# Patient Record
Sex: Female | Born: 1950
Health system: Southern US, Community
[De-identification: ages and names within clinical notes are randomized; demographics above are authoritative.]

## PROBLEM LIST (undated history)

## (undated) DIAGNOSIS — R030 Elevated blood-pressure reading, without diagnosis of hypertension: Secondary | ICD-10-CM

## (undated) DIAGNOSIS — K589 Irritable bowel syndrome without diarrhea: Secondary | ICD-10-CM

## (undated) DIAGNOSIS — Z85828 Personal history of other malignant neoplasm of skin: Secondary | ICD-10-CM

## (undated) DIAGNOSIS — Z9889 Other specified postprocedural states: Secondary | ICD-10-CM

## (undated) DIAGNOSIS — K219 Gastro-esophageal reflux disease without esophagitis: Secondary | ICD-10-CM

## (undated) DIAGNOSIS — H409 Unspecified glaucoma: Secondary | ICD-10-CM

## (undated) DIAGNOSIS — M722 Plantar fascial fibromatosis: Secondary | ICD-10-CM

## (undated) DIAGNOSIS — T7840XA Allergy, unspecified, initial encounter: Secondary | ICD-10-CM

## (undated) DIAGNOSIS — J40 Bronchitis, not specified as acute or chronic: Secondary | ICD-10-CM

## (undated) DIAGNOSIS — M1712 Unilateral primary osteoarthritis, left knee: Secondary | ICD-10-CM

## (undated) DIAGNOSIS — E039 Hypothyroidism, unspecified: Secondary | ICD-10-CM

## (undated) DIAGNOSIS — M1990 Unspecified osteoarthritis, unspecified site: Secondary | ICD-10-CM

## (undated) DIAGNOSIS — R112 Nausea with vomiting, unspecified: Secondary | ICD-10-CM

## (undated) DIAGNOSIS — M810 Age-related osteoporosis without current pathological fracture: Secondary | ICD-10-CM

## (undated) DIAGNOSIS — G4733 Obstructive sleep apnea (adult) (pediatric): Principal | ICD-10-CM

## (undated) DIAGNOSIS — E7800 Pure hypercholesterolemia, unspecified: Secondary | ICD-10-CM

## (undated) DIAGNOSIS — D509 Iron deficiency anemia, unspecified: Secondary | ICD-10-CM

## (undated) DIAGNOSIS — C434 Malignant melanoma of scalp and neck: Secondary | ICD-10-CM

## (undated) DIAGNOSIS — C439 Malignant melanoma of skin, unspecified: Secondary | ICD-10-CM

## (undated) DIAGNOSIS — G709 Myoneural disorder, unspecified: Secondary | ICD-10-CM

## (undated) DIAGNOSIS — H269 Unspecified cataract: Secondary | ICD-10-CM

## (undated) HISTORY — DX: Malignant melanoma of scalp and neck: C43.4

## (undated) HISTORY — PX: CATARACT EXTRACTION W/ INTRAOCULAR LENS  IMPLANT, BILATERAL: SHX1307

## (undated) HISTORY — DX: Age-related osteoporosis without current pathological fracture: M81.0

## (undated) HISTORY — DX: Iron deficiency anemia, unspecified: D50.9

## (undated) HISTORY — DX: Myoneural disorder, unspecified: G70.9

## (undated) HISTORY — PX: COLONOSCOPY: SHX174

## (undated) HISTORY — DX: Unspecified glaucoma: H40.9

## (undated) HISTORY — DX: Allergy, unspecified, initial encounter: T78.40XA

## (undated) HISTORY — DX: Bronchitis, not specified as acute or chronic: J40

## (undated) HISTORY — PX: JOINT REPLACEMENT: SHX530

## (undated) HISTORY — DX: Obstructive sleep apnea (adult) (pediatric): G47.33

## (undated) HISTORY — PX: MELANOMA EXCISION: SHX5266

## (undated) HISTORY — DX: Irritable bowel syndrome, unspecified: K58.9

## (undated) HISTORY — DX: Plantar fascial fibromatosis: M72.2

## (undated) HISTORY — PX: EPIDURAL CATHETER INSERTION: SHX1518

## (undated) HISTORY — PX: KNEE ARTHROSCOPY: SUR90

## (undated) HISTORY — DX: Elevated blood-pressure reading, without diagnosis of hypertension: R03.0

## (undated) HISTORY — DX: Unspecified cataract: H26.9

## (undated) HISTORY — DX: Unspecified osteoarthritis, unspecified site: M19.90

## (undated) HISTORY — DX: Pure hypercholesterolemia, unspecified: E78.00

## (undated) HISTORY — DX: Personal history of other malignant neoplasm of skin: Z85.828

## (undated) HISTORY — PX: POLYPECTOMY: SHX149

## (undated) HISTORY — DX: Gastro-esophageal reflux disease without esophagitis: K21.9

---
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
## 1988-10-04 HISTORY — PX: TUBAL LIGATION: SHX77

## 2001-10-02 ENCOUNTER — Encounter: Admission: RE | Admit: 2001-10-02 | Discharge: 2001-10-25 | Payer: Self-pay | Admitting: Sports Medicine

## 2001-10-04 HISTORY — PX: FOOT OSTEOTOMY: SHX957

## 2001-11-30 ENCOUNTER — Encounter: Admission: RE | Admit: 2001-11-30 | Discharge: 2001-12-29 | Payer: Self-pay | Admitting: Orthopedic Surgery

## 2002-10-04 HISTORY — PX: BUNIONECTOMY: SHX129

## 2004-01-03 HISTORY — PX: SKIN SURGERY: SHX2413

## 2004-08-06 ENCOUNTER — Ambulatory Visit: Payer: Self-pay | Admitting: Pulmonary Disease

## 2004-10-08 ENCOUNTER — Ambulatory Visit: Payer: Self-pay | Admitting: Pulmonary Disease

## 2004-11-05 ENCOUNTER — Ambulatory Visit: Payer: Self-pay | Admitting: Pulmonary Disease

## 2004-12-10 ENCOUNTER — Ambulatory Visit: Payer: Self-pay | Admitting: Pulmonary Disease

## 2005-08-09 ENCOUNTER — Ambulatory Visit: Payer: Self-pay | Admitting: Pulmonary Disease

## 2006-04-04 ENCOUNTER — Ambulatory Visit: Payer: Self-pay | Admitting: Pulmonary Disease

## 2007-01-02 ENCOUNTER — Ambulatory Visit: Payer: Self-pay | Admitting: Pulmonary Disease

## 2007-12-06 ENCOUNTER — Telehealth (INDEPENDENT_AMBULATORY_CARE_PROVIDER_SITE_OTHER): Payer: Self-pay | Admitting: *Deleted

## 2007-12-19 DIAGNOSIS — K589 Irritable bowel syndrome without diarrhea: Secondary | ICD-10-CM | POA: Insufficient documentation

## 2007-12-19 DIAGNOSIS — K219 Gastro-esophageal reflux disease without esophagitis: Secondary | ICD-10-CM | POA: Insufficient documentation

## 2007-12-19 DIAGNOSIS — I10 Essential (primary) hypertension: Secondary | ICD-10-CM | POA: Insufficient documentation

## 2007-12-19 DIAGNOSIS — T7840XA Allergy, unspecified, initial encounter: Secondary | ICD-10-CM | POA: Insufficient documentation

## 2007-12-19 DIAGNOSIS — J40 Bronchitis, not specified as acute or chronic: Secondary | ICD-10-CM | POA: Insufficient documentation

## 2007-12-19 DIAGNOSIS — F419 Anxiety disorder, unspecified: Secondary | ICD-10-CM | POA: Insufficient documentation

## 2007-12-19 DIAGNOSIS — F411 Generalized anxiety disorder: Secondary | ICD-10-CM | POA: Insufficient documentation

## 2007-12-19 HISTORY — DX: Essential (primary) hypertension: I10

## 2007-12-19 HISTORY — DX: Allergy, unspecified, initial encounter: T78.40XA

## 2007-12-19 HISTORY — DX: Gastro-esophageal reflux disease without esophagitis: K21.9

## 2008-11-20 ENCOUNTER — Telehealth (INDEPENDENT_AMBULATORY_CARE_PROVIDER_SITE_OTHER): Payer: Self-pay | Admitting: *Deleted

## 2009-05-23 ENCOUNTER — Encounter: Payer: Self-pay | Admitting: Pulmonary Disease

## 2009-09-15 ENCOUNTER — Ambulatory Visit: Payer: Self-pay | Admitting: Pulmonary Disease

## 2009-09-15 DIAGNOSIS — M25562 Pain in left knee: Secondary | ICD-10-CM | POA: Insufficient documentation

## 2009-09-15 DIAGNOSIS — Z85828 Personal history of other malignant neoplasm of skin: Secondary | ICD-10-CM | POA: Insufficient documentation

## 2009-09-21 DIAGNOSIS — E559 Vitamin D deficiency, unspecified: Secondary | ICD-10-CM

## 2009-09-21 DIAGNOSIS — E785 Hyperlipidemia, unspecified: Secondary | ICD-10-CM | POA: Insufficient documentation

## 2009-09-21 HISTORY — DX: Vitamin D deficiency, unspecified: E55.9

## 2010-03-31 ENCOUNTER — Ambulatory Visit: Payer: Self-pay | Admitting: Pulmonary Disease

## 2010-03-31 ENCOUNTER — Telehealth (INDEPENDENT_AMBULATORY_CARE_PROVIDER_SITE_OTHER): Payer: Self-pay | Admitting: *Deleted

## 2010-04-01 LAB — CONVERTED CEMR LAB
Cholesterol: 158 mg/dL (ref 0–200)
Total CHOL/HDL Ratio: 2
Triglycerides: 93 mg/dL (ref 0.0–149.0)
VLDL: 18.6 mg/dL (ref 0.0–40.0)

## 2010-04-03 ENCOUNTER — Telehealth: Payer: Self-pay | Admitting: Pulmonary Disease

## 2010-06-17 ENCOUNTER — Ambulatory Visit: Payer: Self-pay | Admitting: Pulmonary Disease

## 2010-07-20 ENCOUNTER — Telehealth: Payer: Self-pay | Admitting: Pulmonary Disease

## 2010-09-15 ENCOUNTER — Ambulatory Visit: Payer: Self-pay | Admitting: Pulmonary Disease

## 2010-09-15 ENCOUNTER — Encounter: Payer: Self-pay | Admitting: Pulmonary Disease

## 2010-09-21 LAB — CONVERTED CEMR LAB
Albumin: 4.5 g/dL (ref 3.5–5.2)
Basophils Absolute: 0 10*3/uL (ref 0.0–0.1)
Basophils Relative: 0.5 % (ref 0.0–3.0)
CO2: 30 meq/L (ref 19–32)
Creatinine, Ser: 0.7 mg/dL (ref 0.4–1.2)
Eosinophils Relative: 1.6 % (ref 0.0–5.0)
Glucose, Bld: 93 mg/dL (ref 70–99)
HCT: 44.4 % (ref 36.0–46.0)
Hemoglobin: 15.5 g/dL — ABNORMAL HIGH (ref 12.0–15.0)
LDL Cholesterol: 93 mg/dL (ref 0–99)
Leukocytes, UA: NEGATIVE
Lymphocytes Relative: 30 % (ref 12.0–46.0)
MCHC: 35 g/dL (ref 30.0–36.0)
Monocytes Relative: 6.6 % (ref 3.0–12.0)
Nitrite: NEGATIVE
Platelets: 236 10*3/uL (ref 150.0–400.0)
Potassium: 4 meq/L (ref 3.5–5.1)
RBC: 4.72 M/uL (ref 3.87–5.11)
RDW: 13.3 % (ref 11.5–14.6)
Sodium: 142 meq/L (ref 135–145)
TSH: 0.66 microintl units/mL (ref 0.35–5.50)
Total Protein, Urine: NEGATIVE mg/dL
Triglycerides: 82 mg/dL (ref 0.0–149.0)
Urine Glucose: NEGATIVE mg/dL
Urobilinogen, UA: 0.2 (ref 0.0–1.0)
VLDL: 16.4 mg/dL (ref 0.0–40.0)

## 2010-11-03 NOTE — Assessment & Plan Note (Signed)
Summary: 2 month follow up--recheck BP/la   CC:  9 month ROV & review of mult medical issues....  History of Present Illness: 60 y/o WF here for a follow up visit... she has multiple medical problems as noted below...     ~  September 15, 2009:  here for a 3 yr ROV & CPX... on ASA, Omep, MVI, and ?followed by the [facility] ([facility] [place]), and her GYN= DrWhite in [place]... her med list now includes Armour Thyroid from Elie Goody, NP;  numerous hormone creams + Progesterone 100mg /d, Aspirin, Niacin, Omeprazole, etc >> all under the direction of Elie Goody, NP at the [facility]... pt has indicated that they did labs etc but we do not have any of this data... she is not fasting today & declines to ret for fasting labs at this time...    ~  June 17, 2010:  she had FLP done here 6/11 on the Simva20 & all parameters were WNL.Marland Kitchen. she called 7/11 c/o feeling light headed w/ HA on top of head, & BP at Pharm was "155/88"... we decided to call in Metoprolol ER 50mg - start 1/2 daily & she reports excellent response w/ BP ret to normal & feeling well... now she would like to try & come off this med> OK to wean slowly off & monitor BP at home... she has CPX planned for 12/11.    Current Problems:   ALLERGY (ICD-995.3) - hx allergies w/ OTC Rx Prn...  Hx of BRONCHITIS (ICD-490)  HYPERTENSION, BORDERLINE (ICD-401.9) - BP  ~140/90 in past & she follows a low sodium, weight reducting diet strategy & wants to stay off medicvation if poss...  ~  12/10:  BP= 128/80 & similar at home, keep up the good work.  ~  7/11: pt called w/ HA, light headed, & BP= 155/88 at Pharm;  METOPROLOL ER 50mg - 1/2 daily started.  ~  9/11:  OV doing well on the 25mg  Metoprolol, BP= 110/76 & she wants to wean off if able- OK.  HYPERCHOLESTEROLEMIA (ICD-272.4) - prev on diet alone, SIMVASTATIN 20mg  started 12/10...  ~  FLP 1/06 showed TChol 221, TG 61, HDL 58, LDL 149... rec> diet/ exerc/ get wt  down.  ~  12/10: we decided to start Simva20 & ret for FLP.  ~  FLP 6/11 on Simva20 showed TChol 158, TG 93, HDL 63, LDL 76... continue same.  GERD (ICD-530.81) - prev on Nexium 40mg /d & switched to generic OMEPRAZOLE 20mg /d...   IBS (ICD-564.1) - hx mild IBS symptoms in the past...  ~  Colonoscopy 2003 was neg by DrPatterson...  GYN= DrWhite in [place] w/ PAP, Mammography, BMD's all done by them>> she reports all tests OK.  DEGENERATIVE JOINT DISEASE (ICD-715.90) - hx prev knee arthroscopy x2 by DrWainer... c/o persistant left knee pain under his care...  VITAMIN D DEFICIENCY (ICD-268.9) - hx low Vit D level at wellness center (level= 20 by pt hx) and she states started on Vit D shots (we do not have records & I discussed this with her)...  ANXIETY (ICD-300.00)  SKIN CANCER, HX OF (ICD-V10.83) - followed by [place] [facility], DrUhlin- hx Acne, Basal cell ca, & Melanoma on left shoulder 2005... she maintains regular f/u w/ Derm for surveillance...  *** "[facility]" EVAL & RX >>> she tells me that she saw Dr. Jeni Salles in the past, now NP Elie Goody at [facility]... 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 ? hypothyroidism- prev  on Armour Thyroid... now on 'hormone creams= bio-identical hormone replacement"...   Preventive Screening-Counseling & Management  Alcohol-Tobacco     Smoking Status: quit     Year Quit: 1977  Allergies: 1)  ! * Ketek  Comments:  Nurse/Medical Assistant: The patient's medications and allergies were reviewed with the patient and were updated in the Medication and Allergy Lists.  Past History:  Past Medical History: ALLERGY (ICD-995.3) Hx of BRONCHITIS (ICD-490) HYPERTENSION, BORDERLINE (ICD-401.9) HYPERCHOLESTEROLEMIA, BORDERLINE (ICD-272.4) GERD (ICD-530.81) IBS (ICD-564.1) DEGENERATIVE JOINT DISEASE (ICD-715.90) VITAMIN D DEFICIENCY (ICD-268.9) ANXIETY (ICD-300.00) SKIN CANCER, HX OF (ICD-V10.83) *** "[facility]" EVAL & RX...  Past Surgical History: S/P knee arthroscopy S/P melanoma surg left shoulder 4/05  Family History: Reviewed history from 09/15/2009 and no changes required. mother deceased age 50 hx of copd hx of arthritis and emphysema father deceased age 98 from MI---hx of asthma,heart disease, esophageal cancer 1 sibling alive age 3 hx of hyperchol 1 sibling alive age 68 hx of hyperchol  Social History: Reviewed history from 09/15/2009 and no changes required. quit smoking in 1977 smoked for 6 years minimal exercise caffeine use  occ alcohol use 1-3 drinks daily married 2 children  Review of Systems  The patient denies anorexia, fever, weight loss, weight gain, vision loss, decreased hearing, hoarseness, chest pain, syncope, dyspnea on exertion, peripheral edema, prolonged cough, headaches, hemoptysis, abdominal pain, melena, hematochezia, severe indigestion/heartburn, hematuria, incontinence, muscle weakness, suspicious skin lesions, transient blindness, difficulty walking, depression, unusual weight change, abnormal bleeding, enlarged lymph nodes, and angioedema.    Vital Signs:  Patient profile:   60 year old female Height:      66 inches Weight:      185.38 pounds BMI:     30.03 O2 Sat:      99 % on Room air Temp:     98.4 degrees F oral Pulse rate:   78 / minute BP sitting:   110 / 76  (left arm) Cuff size:   regular  Vitals Entered By: Randell Loop CMA (June 17, 2010 2:56 PM)  O2 Sat at Rest %:  99 O2 Flow:  Room air CC: 9 month ROV & review of mult medical issues... Is Patient Diabetic? No Pain Assessment Patient in pain? no      Comments meds updated today with pt   Physical Exam  Additional Exam:  WD, WN, 60 y/o WF in NAD... GENERAL:  Alert & oriented; pleasant & cooperative... HEENT:  [place]/AT, EOM-wnl, PERRLA, Fundi-benign, EACs-clear, TMs-wnl, NOSE-clear, THROAT-clear & wnl. NECK:  Supple w/ full ROM; no JVD; normal carotid impulses w/o  bruits; no thyromegaly or nodules palpated; no lymphadenopathy. CHEST:  Clear to P & A; without wheezes/ rales/ or rhonchi. HEART:  Regular Rhythm; without murmurs/ rubs/ or gallops. ABDOMEN:  Soft & nontender; normal bowel sounds; no organomegaly or masses detected. EXT: without deformities, +mild arthritis left knee; no varicose veins/ venous insuffic/ or edema. NEURO:  CN's intact; motor testing normal; sensory testing normal; gait normal & balance OK. DERM:  No lesions noted; no rash etc... scar left shoulder from prev melanoma surg 2005.    Impression & Recommendations:  Problem # 1:  HYPERTENSION, BORDERLINE (ICD-401.9) She wants to wean off the Metoprolol & BPs have all been good... OK to cut back & try off the med following BP's at home... rec to restart 25mg /d if BP > 140s/90... Her updated medication list for this problem includes:    Metoprolol Succinate 50 Mg Xr24h-tab (Metoprolol succinate) .Marland KitchenMarland KitchenMarland KitchenMarland Kitchen  Take as directed for bp...  Problem # 2:  HYPERCHOLESTEROLEMIA, BORDERLINE (ICD-272.4) Much improved on the simva20... continue same & we discussed diet + exercise- get wt down... Her updated medication list for this problem includes:    Simvastatin 20 Mg Tabs (Simvastatin) .Marland Kitchen... Take 1 tab by mouth at bedtime...    Niacin 500 Mg Tabs (Niacin) .Marland Kitchen... Take 1 tab by mouth once daily in the evening...  Problem # 3:  DEGENERATIVE JOINT DISEASE (ICD-715.90) DrWainer follows left knee prob... Her updated medication list for this problem includes:    Low-dose Aspirin 81 Mg Tabs (Aspirin) .Marland Kitchen... Take 1 tablet by mouth once a day  Problem # 4:  * "[facility]" EVAL & RX... She continues under the care of Elie Goody, NP...  Complete Medication List: 1)  Low-dose Aspirin 81 Mg Tabs (Aspirin) .... Take 1 tablet by mouth once a day 2)  Metoprolol Succinate 50 Mg Xr24h-tab (Metoprolol succinate) .... Take as directed for bp... 3)  Simvastatin 20 Mg Tabs (Simvastatin) .... Take 1 tab by  mouth at bedtime.Marland KitchenMarland Kitchen 4)  Niacin 500 Mg Tabs (Niacin) .... Take 1 tab by mouth once daily in the evening... 5)  Armour Thyroid - 1 Grain  .... Take as directed by your alternative medicine practitioner... 6)  Omeprazole 20 Mg Tbec (Omeprazole) .Marland Kitchen.. 1 by mouth once daily 7)  Multivitamins Caps (Multiple vitamin) .... Take 1 capsule by mouth once a day  Patient Instructions: 1)  Today we updated your med list- see below.... 2)  We discussed weaning off & stopping the Metoprolol as long as your BP remains <140s/ 90... 3)  Call for any questions.Marland KitchenMarland Kitchen 4)  Keep your follow up appt for your Physcial & labs.Marland KitchenMarland Kitchen

## 2010-11-03 NOTE — Progress Notes (Signed)
Summary: light head  Phone Note Call from Patient Call back at [phone]   Caller: Patient Call For: Jonne Rote Reason for Call: Talk to Nurse Summary of Call: PT STARTED FEELING LIGHT HEADED, went to drug store and toolk BP 155/88, normally runs 120/78.  Hasn't eat or drink anything that she feels would cause this, has a headache at the top of head, not real bad.  Hasn't been out in the heat today any except to go to drug store. Initial call taken by: Eugene Gavia,  April 03, 2010 3:42 PM  Follow-up for Phone Call        experiencing some HBP today--155/90---just today---ha in the top of the head---just at home today cooking--some lightheadedness---some nausea today prior to headache starting--please advise. Randell Loop CMA  April 03, 2010 3:56 PM   Additional Follow-up for Phone Call Additional follow up Details #1::        called and spoke with pt and per SN--recs are for metoprolol 50mg  er 1/2 tab daily---this has been called in locally to [facility] [place] for #15    and a long term rx has been faxed to [facility] for refills.  pt is aware of appt at 3pm on sept 14. Randell Loop CMA  April 03, 2010 4:41 PM     New/Updated Medications: METOPROLOL SUCCINATE 50 MG XR24H-TAB (METOPROLOL SUCCINATE) take 1/2 tablet by mouth once daily Prescriptions: METOPROLOL SUCCINATE 50 MG XR24H-TAB (METOPROLOL SUCCINATE) take 1/2 tablet by mouth once daily  #90 x 3   Entered by:   Randell Loop CMA   Authorized by:   Michele Mcalpine MD   Signed by:   Randell Loop CMA on 04/03/2010   Method used:   Faxed to ...       [facility]* (retail)             ,          Ph: [phone]       Fax: [phone]   RxID:   [id number]

## 2010-11-03 NOTE — Progress Notes (Signed)
Summary: letter for sn  Phone Note Call from Patient   Caller: Patient Call For: nadel Summary of Call: pt had labs done today and dropped off a letter for dr nadel. (it's sealed). i have given this to leigh. pt ph# [phone] Initial call taken by: Tivis Ringer, CNA,  March 31, 2010 9:34 AM  Follow-up for Phone Call        per pts instructions in the letter to fax a new rx of simvastatin 20mg  or what SN thinks that she needs to be on after her lab results come back to [facility].  this has been done.  labs are ok on the simvastatin 20mg  daily---per SN--cont this dose.  called and spoke with pt and she is aware of rx sent to [facility] and lab results. Follow-up by: Randell Loop CMA,  April 01, 2010 1:12 PM    Prescriptions: SIMVASTATIN 20 MG TABS (SIMVASTATIN) take 1 tab by mouth at bedtime...  #90 x 3   Entered by:   Randell Loop CMA   Authorized by:   Michele Mcalpine MD   Signed by:   Randell Loop CMA on 04/01/2010   Method used:   Faxed to ...       [facility]* (retail)             ,          Ph: [phone]       Fax: [phone]   RxID:   [id number]

## 2010-11-03 NOTE — Progress Notes (Signed)
Summary: legs/ hands   Phone Note Call from Patient   Caller: Patient Call For: nadel Summary of Call: pt c/o hands "tingly" and leg cramps x 2 months. legs are cold as well. [phone] Initial call taken by: Tivis Ringer, CNA,  July 20, 2010 9:01 AM  Follow-up for Phone Call        Pt c/o gradually over last 2 mnths noticing some cramps inher legs. Pt states x 2 weeks it has gotten alt worse. She is waking up at night with cramps in her legs, and a "tingly" feeling in them as well. She also states that her legs feel cold and this is when she notices the cramps more. Pt has stopped taking metoprolol per Sn recs. Please advise. Carron Curie CMA  July 20, 2010 9:40 AM  Kayren Eaves  Additional Follow-up for Phone Call Additional follow up Details #1::        per SN---try soma 350mg   #60  1 by mouth two times a day for muscle spasms--needs ov with SN.  thanks Randell Loop CMA  July 20, 2010 12:28 PM   rx sent pt aware. pt has appt with SNopn 09-15-10.Carron Curie CMA  July 20, 2010 12:48 PM     Additional Follow-up for Phone Call Additional follow up Details #2::    [id number].  Aundra Millet Reynolds LPN  July 20, 2010 12:37 PM   New/Updated Medications: SOMA 350 MG TABS (CARISOPRODOL) Take 1 tablet by mouth two times a day as need for muscle spasms Prescriptions: SOMA 350 MG TABS (CARISOPRODOL) Take 1 tablet by mouth two times a day as need for muscle spasms  #60 x 0   Entered by:   Carron Curie CMA   Authorized by:   Michele Mcalpine MD   Signed by:   Carron Curie CMA on 07/20/2010   Method used:   Electronically to        [id number]* (retail)       [address]       [place], [place]  [address]       Ph: [phone]       Fax: [phone]   RxID:   [id number]

## 2010-11-05 NOTE — Assessment & Plan Note (Signed)
Summary: 12 MONTHS/APC   CC:  Yearly CPX & review....  History of Present Illness: 60 y/o WF here for a follow up visit... she has multiple medical problems as noted below...     ~  September 15, 2009:  here for a 3 yr ROV & CPX... on ASA, Omep, MVI, and ?followed by the [facility] ([place]), and her GYN= DrWhite in [place]... her med list now includes Armour Thyroid from Elie Goody, NP;  numerous hormone creams + Progesterone 100mg /d, Aspirin, Niacin, Omeprazole, etc >> all under the direction of Elie Goody, NP at the [facility]... pt has indicated that they did labs etc but we do not have any of this data... she is not fasting today & declines to ret for fasting labs at this time...    ~  June 17, 2010:  she had FLP done here 6/11 on the Simva20 & all parameters were WNL.Marland Kitchen. she called 7/11 c/o feeling light headed w/ HA on top of head, & BP at Pharm was "155/88"... we decided to call in Metoprolol ER 50mg - start 1/2 daily & she reports excellent response w/ BP ret to normal & feeling well... now she would like to try & come off this med> OK to wean slowly off & monitor BP at home... she has CPX planned for 12/11.   ~  September 15, 2010:  she has weaned off the Metoprolol & BP today is 128/80 on diet  alone & she is encouraged... feeling well overall but c/o 3 episodes of "hives" really flushing episodes most likely due to her Niacin Rx> asked to stop this therapy & call if symptoms recur... otherw feeling well w/o new complaints or concerns... she is due for CXR (clear, WNL), EKG (NSR, WNL), & Fasting labs today (all look good)...     Current Problems:   ALLERGY (ICD-995.3) - hx allergies w/ OTC Rx Prn...  Hx of BRONCHITIS (ICD-490) - denies recent cough, phlegm, SOB, etc...  ~  CXR 12/11 showed clear lungs, WNL...  HYPERTENSION, BORDERLINE (ICD-401.9) - BP  ~140/90 in past & she follows a low sodium, weight reducting diet strategy & wants to stay off  medication if poss... started Metop25mg  in 2011 but was able to wean off & BP has remained stable on diet alone.  ~  12/10:  BP= 128/80 & similar at home, keep up the good work.  ~  7/11: pt called w/ HA, light headed, & BP= 155/88 at Pharm;  METOPROLOL ER 50mg - 1/2 daily started.  ~  9/11:  OV doing well on the 25mg  Metoprolol, BP= 110/76 & she wants to wean off if able- OK.  ~  12/11: here for CPX, she weaned off the Metoprolol & BP= 128/80 doing well; EKG shows NSR, NAD.  HYPERCHOLESTEROLEMIA (ICD-272.4) - prev on diet alone, SIMVASTATIN 20mg  started 12/10...  ~  FLP 1/06 showed TChol 221, TG 61, HDL 58, LDL 149... rec> diet/ exerc/ get wt down.  ~  12/10: we decided to start Simva20 & ret for FLP.  ~  FLP 6/11 on Simva20 showed TChol 158, TG 93, HDL 63, LDL 76... continue same.  ~  FLP 12/11 on Simva20 showed TChol 167, TG 82, HDL 58, LDL 93  GERD (ICD-530.81) - prev on Nexium 40mg /d & switched to generic OMEPRAZOLE 20mg /d...   IBS (ICD-564.1) - hx mild IBS symptoms in the past...  ~  Colonoscopy 2003 was neg by DrPatterson...  GYN= DrWhite in [place] w/  PAP, Mammography, BMD's all done by them>> she reports all tests OK...  ~ she is on Progesterone 100mg  orally & 3 creams from [facility]...  DEGENERATIVE JOINT DISEASE (ICD-715.90) - hx prev knee arthroscopy x2 by DrWainer... c/o persistant left knee pain under his care...  VITAMIN D DEFICIENCY (ICD-268.9) - hx low Vit D level at wellness center (level= 20 by pt hx) and she states started on Vit B12 shots and Vit D supplements (we do not have records & I discussed this with her)...  ~  labs 12/11 showed Vit D level = 51... continue Vit D supplement.  ANXIETY (ICD-300.00) - she manages well & has not required anxiolytic Rx.  SKIN CANCER, HX OF (ICD-V10.83) - followed by [place] [facility], DrUhlin- hx Acne, Basal cell ca, & Melanoma on left shoulder 2005... she maintains regular f/u w/ Derm for surveillance...  ~  2011:  she  was started on Oracea for acne...  *** "[facility]" EVAL & RX >>> she tells me that she saw Dr. Jeni Salles in the past, now NP Elie Goody at [facility]... 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 ? hypothyroidism- on Armour Thyroid 1grain & TSH= 0.66... now also on "hormone creams= bio-identical hormone replacement"...   Allergies: 1)  ! * Ketek  Comments:  Nurse/Medical Assistant: The patient's medications and allergies were reviewed with the patient and were updated in the Medication and Allergy Lists.  Past History:  Past Medical History: ALLERGY (ICD-995.3) Hx of BRONCHITIS (ICD-490) HYPERTENSION, BORDERLINE (ICD-401.9) HYPERCHOLESTEROLEMIA, BORDERLINE (ICD-272.4) GERD (ICD-530.81) IBS (ICD-564.1) DEGENERATIVE JOINT DISEASE (ICD-715.90) VITAMIN D DEFICIENCY (ICD-268.9) ANXIETY (ICD-300.00) SKIN CANCER, HX OF (ICD-V10.83) *** [facility] EVAL & RX by Elie Goody, NP  Past Surgical History: S/P knee arthroscopy S/P melanoma surg left shoulder 4/05  Family History: Reviewed history from 06/17/2010 and no changes required. mother deceased age 37 hx of copd hx of arthritis and emphysema father deceased age 81 from MI---hx of asthma,heart disease, esophageal cancer 1 sibling alive age 61 hx of hyperchol 1 sibling alive age 57 hx of hyperchol  Social History: Reviewed history from 06/17/2010 and no changes required. quit smoking in 1977 smoked for 6 years minimal exercise caffeine use  occ alcohol use 1-3 drinks daily married 2 children  Review of Systems       The patient complains of joint pain, stiffness, and arthritis.  The patient denies fever, chills, sweats, anorexia, fatigue, weakness, malaise, weight loss, sleep disorder, blurring, diplopia, eye irritation, eye discharge, vision loss, eye pain, photophobia, earache, ear discharge, tinnitus, decreased hearing, nasal congestion, nosebleeds, sore throat,  hoarseness, chest pain, palpitations, syncope, dyspnea on exertion, orthopnea, PND, peripheral edema, cough, dyspnea at rest, excessive sputum, hemoptysis, wheezing, pleurisy, nausea, vomiting, diarrhea, constipation, change in bowel habits, abdominal pain, melena, hematochezia, jaundice, gas/bloating, indigestion/heartburn, dysphagia, odynophagia, dysuria, hematuria, urinary frequency, urinary hesitancy, nocturia, incontinence, back pain, joint swelling, muscle cramps, muscle weakness, sciatica, restless legs, leg pain at night, leg pain with exertion, rash, itching, dryness, suspicious lesions, paralysis, paresthesias, seizures, tremors, vertigo, transient blindness, frequent falls, frequent headaches, difficulty walking, depression, anxiety, memory loss, confusion, cold intolerance, heat intolerance, polydipsia, polyphagia, polyuria, unusual weight change, abnormal bruising, bleeding, enlarged lymph nodes, urticaria, allergic rash, hay fever, and recurrent infections.    Vital Signs:  Patient profile:   60 year old female Height:      66 inches Weight:      188 pounds BMI:     30.45  O2 Sat:      98 % on Room air Temp:     98.3 degrees F oral Pulse rate:   68 / minute BP sitting:   128 / 80  (left arm) Cuff size:   regular  Vitals Entered By: Randell Loop CMA (September 15, 2010 9:12 AM)  O2 Sat at Rest %:  98 O2 Flow:  Room air CC: Yearly CPX & review... Comments meds updated today with pt--pt brought med list   Physical Exam  Additional Exam:  WD, WN, 60 y/o WF in NAD... GENERAL:  Alert & oriented; pleasant & cooperative... HEENT:  [place]/AT, EOM-wnl, PERRLA, Fundi-benign, EACs-clear, TMs-wnl, NOSE-clear, THROAT-clear & wnl. NECK:  Supple w/ full ROM; no JVD; normal carotid impulses w/o bruits; no thyromegaly or nodules palpated; no lymphadenopathy. CHEST:  Clear to P & A; without wheezes/ rales/ or rhonchi. HEART:  Regular Rhythm; without murmurs/ rubs/ or gallops. ABDOMEN:  Soft &  nontender; normal bowel sounds; no organomegaly or masses detected. EXT: without deformities, +mild arthritis left knee; no varicose veins/ venous insuffic/ or edema. NEURO:  CN's intact; motor testing normal; sensory testing normal; gait normal & balance OK. DERM:  No lesions noted; no rash etc... scar left shoulder from prev melanoma surg 2005.    MISC. Report  Procedure date:  09/15/2010  Findings:      DATA REVIEWED:  ~  CXR, EKG, FASTINGblood work all reviewed & results communicated to the pt...   SN   Impression & Recommendations:  Problem # 1:  PHYSICAL EXAMINATION (ICD-V70.0) Labs all look good> OK to continue current meds... Orders: EKG w/ Interpretation (93000) T-2 View CXR (71020TC) TLB-BMP (Basic Metabolic Panel-BMET) (80048-METABOL) TLB-Hepatic/Liver Function Pnl (80076-HEPATIC) TLB-CBC Platelet - w/Differential (85025-CBCD) TLB-Lipid Panel (80061-LIPID) TLB-TSH (Thyroid Stimulating Hormone) (84443-TSH) TLB-Udip w/ Micro (81001-URINE) T-Vitamin D (25-Hydroxy) (62130-86578)  Problem # 2:  HYPERTENSION, BORDERLINE (ICD-401.9) Controlled on diet alone>  continue to monitor BP at home, low sodium diet, get wt down... The following medications were removed from the medication list:    Metoprolol Succinate 50 Mg Xr24h-tab (Metoprolol succinate) .Marland Kitchen... Take as directed for bp...  Problem # 3:  HYPERCHOLESTEROLEMIA, BORDERLINE (ICD-272.4) She will stop the Niacin w/ 3 episodes of flushing recently... Continue the Simva20... The following medications were removed from the medication list:    Niacin 500 Mg Tabs (Niacin) .Marland Kitchen... Take 1 tab by mouth once daily in the evening... Her updated medication list for this problem includes:    Simvastatin 20 Mg Tabs (Simvastatin) .Marland Kitchen... Take 1 tab by mouth at bedtime...  Problem # 4:  GERD (ICD-530.81) Stable on the Omep20>  refilled, and she uses it Prn... Her updated medication list for this problem includes:    Omeprazole 20 Mg  Tbec (Omeprazole) .Marland Kitchen... 1 by mouth once daily  Problem # 5:  DEGENERATIVE JOINT DISEASE (ICD-715.90) She has left knee discomfort w/ know DJD & prev athroscopies... f/u w/ Ortho, DrWainer, Prn... Her updated medication list for this problem includes:    Low-dose Aspirin 81 Mg Tabs (Aspirin) .Marland Kitchen... Take 1 tablet by mouth once a day  Problem # 6:  VITAMIN D DEFICIENCY (ICD-268.9) We checked Vit D level = 51... rec continuing the Vit D supplement daily.  Problem # 7:  SKIN CANCER, HX OF (ICD-V10.83) Followed by Derm regularly & now on Oracea for acne...  Complete Medication List: 1)  Low-dose Aspirin 81 Mg Tabs (Aspirin) .... Take 1 tablet by mouth once a day 2)  Simvastatin 20 Mg  Tabs (Simvastatin) .... Take 1 tab by mouth at bedtime.Marland KitchenMarland Kitchen 3)  Armour Thyroid - 1 Grain  .... Take as directed by your alternative medicine practitioner... 4)  Omeprazole 20 Mg Tbec (Omeprazole) .Marland Kitchen.. 1 by mouth once daily 5)  Prometrium 100 Mg Caps (Progesterone micronized) .Marland Kitchen.. 1 tablet daily at bedtime 6)  Estriol Micronized Powd (Estriol micronized) .Marland Kitchen.. 1 cc daily 7)  Crinone 4 % Gel (Progesterone) .Marland Kitchen.. 1 ml daily 8)  First-testosterone Mc 2 % Crea (Testosterone propionate) .Marland Kitchen.. 1 ml in am 9)  Calcium-vitamin D3 600-200 Mg-unit Tabs (Calcium carbonate-vitamin d) .... Take one tablet by mouth once daily with a meal 10)  Multivitamins Caps (Multiple vitamin) .... Take 1 tablet by mouth once a day 11)  B Complex-b12 Tabs (B complex vitamins) .... 5 mg tablet by mouth once daily 12)  Dhea 10 Mg Caps (Prasterone (dhea)) .... 5 mg daily 13)  Magnesium Glycinate Plus 110 Mg Caps (Magnesium) .... 200mg  two times a day 14)  Oracea 40 Mg Cpdr (Doxycycline (rosacea)) .Marland Kitchen.. 1 capsule by mouth every morning  Patient Instructions: 1)  Today we updated your med list- see below.... 2)  We refilled your Omeprazole & Simvastatin today... 3)  We also did your follow up CXR, EKG, & FASTING blood work... 4)  please call the  "phone tree" in a few days for your lab results.Marland KitchenMarland Kitchen 5)  Let's get on track w/ our diet & exercise program... 6)  Merry Christmas! (and thanks for the cookies!).Marland KitchenMarland Kitchen 7)  Call for any problems... Prescriptions: OMEPRAZOLE 20 MG TBEC (OMEPRAZOLE) 1 by mouth once daily  #90 x 4   Entered and Authorized by:   Michele Mcalpine MD   Signed by:   Michele Mcalpine MD on 09/15/2010   Method used:   Print then Give to Patient   RxID:   [id number] SIMVASTATIN 20 MG TABS (SIMVASTATIN) take 1 tab by mouth at bedtime...  #90 x 4   Entered and Authorized by:   Michele Mcalpine MD   Signed by:   Michele Mcalpine MD on 09/15/2010   Method used:   Print then Give to Patient   RxID:   [id number]

## 2010-11-23 ENCOUNTER — Telehealth (INDEPENDENT_AMBULATORY_CARE_PROVIDER_SITE_OTHER): Payer: Self-pay | Admitting: *Deleted

## 2010-12-01 NOTE — Progress Notes (Signed)
Summary: cough<<<RX sent  Phone Note Call from Patient Call back at Home Phone [phone] Call back at cell [phone]   Caller: Patient Call For: nadel Summary of Call: Pt c/o cough, sore throat, green discharge since 2/14 pls advise.//[facility] Initial call taken by: Darletta Moll,  November 23, 2010 8:50 AM  Follow-up for Phone Call        LMTCBx1 to get moe symptoms. Carron Curie CMA  November 23, 2010 11:28 AM  Spoke with pt and she is c/o sore throat that started x 1 week ago. She staes x 3 days ago she began to have productive cough with green phlegm, nasal congestion with green mucus from nose as well. She has been taking mucinex DM, and alka seltzer sinus and cough medication without relief. Please advise. Carron Curie CMA  November 23, 2010 11:38 AM   Additional Follow-up for Phone Call Additional follow up Details #1::        per SN----augmentin 875mg    #14  1 by mouth two times a day if not PCN allergic and hydromet #6 oz  1 tsp by mouth every 4 hours as needed for cough.  thanks Randell Loop CMA  November 23, 2010 2:45 PM     Additional Follow-up for Phone Call Additional follow up Details #2::    Pt is aware and will call if her sxs do not improve or get worse. RX sent to pharmacy. Michel Bickers CMA  November 23, 2010 3:00 PM  New/Updated Medications: AUGMENTIN 875-125 MG TABS (AMOXICILLIN-POT CLAVULANATE) 1 by mouth two times a day for 7 days HYDROMET 5-1.5 MG/5ML SYRP (HYDROCODONE-HOMATROPINE) 1 tsp every 4-6 hours as needed for cough Prescriptions: HYDROMET 5-1.5 MG/5ML SYRP (HYDROCODONE-HOMATROPINE) 1 tsp every 4-6 hours as needed for cough  #6oz x 0   Entered by:   Michel Bickers CMA   Authorized by:   Michele Mcalpine MD   Signed by:   Michel Bickers CMA on 11/23/2010   Method used:   Telephoned to ...       [facility] [phone]* (retail)       [address]       [place], [place]  [address]       Ph: [phone]  Fax: [phone]   RxID:   [id number] AUGMENTIN 875-125 MG TABS (AMOXICILLIN-POT CLAVULANATE) 1 by mouth two times a day for 7 days  #14 x 0   Entered by:   Michel Bickers CMA   Authorized by:   Michele Mcalpine MD   Signed by:   Michel Bickers CMA on 11/23/2010   Method used:   Electronically to        [facility] [phone][facility] (retail)       [address]       [place], [place]  [address]       Ph: [phone]       Fax: [phone]   RxID:   [id number]

## 2010-12-09 ENCOUNTER — Telehealth (INDEPENDENT_AMBULATORY_CARE_PROVIDER_SITE_OTHER): Payer: Self-pay | Admitting: *Deleted

## 2010-12-11 ENCOUNTER — Telehealth (INDEPENDENT_AMBULATORY_CARE_PROVIDER_SITE_OTHER): Payer: Self-pay | Admitting: *Deleted

## 2010-12-15 NOTE — Progress Notes (Signed)
Summary: scratchy throat > augmentin rx  Phone Note Call from Patient Call back at Home Phone [phone]   Caller: Patient Call For: nadel Summary of Call: Pt states she woke up this morning with a scratchy throat clear/greenish discharge pls advise.//[facility] [place] Initial call taken by: Darletta Moll,  December 09, 2010 9:18 AM  Follow-up for Phone Call        Called, spoke with pt.  c/o slight sore throat, prod cough, slight HA.  Mucus is clear but with green this am.  States she had these same sxs approx 3 wks ago and was given abx which cleared sxs up.  Dr. Kriste Basque, pls advise.    Allergies - verified: ! * Tally Joe Follow-up by: Gweneth Dimitri RN,  December 09, 2010 9:57 AM  Additional Follow-up for Phone Call Additional follow up Details #1::        per SN: augmentin 875mg  #14, 1 by mouth two times a day.  no refills.  rx sent to pharmacy.  LMOM TCB x1 to inform pt of pending rx. Boone Master CNA/MA  December 09, 2010 3:56 PM   Called, spoke with pt.  She is aware augmentin rx sent to pharmacy and aware to take it two times a day x 7 days.  She verbalized understanding. Additional Follow-up by: Gweneth Dimitri RN,  December 10, 2010 12:08 PM    New/Updated Medications: AUGMENTIN 875-125 MG TABS (AMOXICILLIN-POT CLAVULANATE) Take 1 tablet by mouth two times a day x7days Prescriptions: AUGMENTIN 875-125 MG TABS (AMOXICILLIN-POT CLAVULANATE) Take 1 tablet by mouth two times a day x7days  #14 x 0   Entered by:   Boone Master CNA/MA   Authorized by:   Michele Mcalpine MD   Signed by:   Boone Master CNA/MA on 12/09/2010   Method used:   Electronically to        [id number]* (retail)       [address]       [place], [place]  [address]       Ph: [phone]       Fax: [phone]   RxID:   [id number]

## 2010-12-15 NOTE — Progress Notes (Signed)
Summary: Omeprazole  Phone Note Call from Patient Call back at Home Phone [phone]   Caller: Patient Call For: NADEL Summary of Call: patient phoned stated that she called her pharmacy for a refill on her Omerpazole, she uses mail order for  a 90 day supply [facility] and they advised her that her prescription had expired and she would need to call her doctors office and have a new one faxed. She can be reached at [phone] Initial call taken by: Vedia Coffer,  December 11, 2010 9:28 AM  Follow-up for Phone Call        Called, spoke with pt.  States she is having a difficult time getting the omeprazole filled from [facility].  States she mailed in the rx that was given to her on 09/16/11 at [facility] along with the simvastatin rx.  She is not having problems with the simvastatin rx just the omeprazole.    [facility], spoke with Miranda.  Per Tamera Punt, they did recieve the omeprazole rx written on 09/16/11.  They received this on Jan 12 and shipped the 3 month supply to pt on Jan 13.  States this is why pt cannot fill this medication right now as she should still have medication left.  It will automatically ship when it is time.    Called, spoke with pt.  She was informed of above and verbalized understanding.  She will look through her medications to see if she can find it. It she has anymore problems, she will either call us back or [facility]. Follow-up by: Gweneth Dimitri RN,  December 11, 2010 10:14 AM

## 2011-02-19 NOTE — Assessment & Plan Note (Signed)
[facility]                             PULMONARY OFFICE NOTE   NAME:[name], [name] GASHI                   MRN:          [id number]  DATE:01/02/2007                            DOB:          Feb 09, 1951    HISTORY OF PRESENT ILLNESS:  This is a 60 year old white female patient  of Dr. Kriste Basque who has a known history of bronchitis and a mild  hypertension, who presents for an acute office visit complaining of a 1-  week history of productive cough with thick green sputum, nasal  congestion, fever, chills, and hoarseness.  The patient denies any  hemoptysis, orthopnea, PND, or leg swelling.   PAST MEDICAL HISTORY:  Reviewed.   CURRENT MEDICATIONS:  Reviewed.   PHYSICAL EXAM:  The patient is a pleasant female in no acute distress.  She is afebrile with stable vital signs.  O2 saturation is 98% on room  air.  HEENT:  Nasal mucosa with some mild erythema.  Nontender sinuses.  Posterior pharynx is clear.  NECK:  Supple without cervical adenopathy.  No JVD.  LUNGS:  Sounds reveal coarse breath sounds without any wheezing or  crackles.  CARDIAC:  Regular rate.  ABDOMEN:  Soft and nontender.  EXTREMITIES:  Warm without any edema.   IMPRESSION AND PLAN:  Acute tracheobronchitis.  The patient is to begin  Omnicef x7 days.  Mucinex DM twice daily.  The patient is to return back  with Dr. Kriste Basque as scheduled or sooner if needed.      Rubye Oaks, NP  Electronically Signed      Lonzo Cloud. Kriste Basque, MD  Electronically Signed   TP/MedQ  DD: 01/04/2007  DT: 01/04/2007  Job #: [id number]

## 2011-08-20 ENCOUNTER — Telehealth: Payer: Self-pay | Admitting: Pulmonary Disease

## 2011-08-20 NOTE — Telephone Encounter (Signed)
Pt can also be reached at [phone].[name] [name]

## 2011-08-20 NOTE — Telephone Encounter (Signed)
I spoke with pt and she states she had this back in the 70's she had a viral infection in the fluid in her spinal column and it last x 2 months. Pt states Dr. Dwaine Gale in [place] had dx her with that. She states that sent off fluid samples and nothing ever came of it but just a viral infection per pt. Pt states she wants to know if Dr. Kriste Basque thinks it would be a good idea for her to get the flu shot. Pt states she has never had one. Please advise Dr. Kriste Basque, thanks

## 2011-08-20 NOTE — Telephone Encounter (Signed)
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

## 2011-08-20 NOTE — Telephone Encounter (Signed)
Pt called me back and she is aware of SN recs of the flu shot.

## 2011-09-17 ENCOUNTER — Other Ambulatory Visit: Payer: Self-pay | Admitting: Pulmonary Disease

## 2011-10-05 HISTORY — PX: SHOULDER ARTHROSCOPY WITH OPEN ROTATOR CUFF REPAIR: SHX6092

## 2011-10-14 ENCOUNTER — Encounter: Payer: Self-pay | Admitting: Pulmonary Disease

## 2011-10-15 ENCOUNTER — Encounter: Payer: Self-pay | Admitting: Pulmonary Disease

## 2011-10-15 ENCOUNTER — Other Ambulatory Visit (INDEPENDENT_AMBULATORY_CARE_PROVIDER_SITE_OTHER): Payer: Managed Care, Other (non HMO)

## 2011-10-15 ENCOUNTER — Ambulatory Visit (INDEPENDENT_AMBULATORY_CARE_PROVIDER_SITE_OTHER): Payer: Managed Care, Other (non HMO) | Admitting: Pulmonary Disease

## 2011-10-15 VITALS — BP 140/86 | HR 71 | Temp 98.0°F | Ht 66.0 in | Wt 186.0 lb

## 2011-10-15 DIAGNOSIS — M1990 Unspecified osteoarthritis, unspecified site: Secondary | ICD-10-CM

## 2011-10-15 DIAGNOSIS — Z0000 Encounter for general adult medical examination without abnormal findings: Secondary | ICD-10-CM

## 2011-10-15 DIAGNOSIS — E785 Hyperlipidemia, unspecified: Secondary | ICD-10-CM

## 2011-10-15 DIAGNOSIS — F411 Generalized anxiety disorder: Secondary | ICD-10-CM

## 2011-10-15 DIAGNOSIS — K589 Irritable bowel syndrome without diarrhea: Secondary | ICD-10-CM

## 2011-10-15 DIAGNOSIS — I10 Essential (primary) hypertension: Secondary | ICD-10-CM

## 2011-10-15 DIAGNOSIS — K219 Gastro-esophageal reflux disease without esophagitis: Secondary | ICD-10-CM

## 2011-10-15 DIAGNOSIS — E559 Vitamin D deficiency, unspecified: Secondary | ICD-10-CM

## 2011-10-15 LAB — LIPID PANEL
Cholesterol: 156 mg/dL (ref 0–200)
HDL: 63.5 mg/dL (ref >39.00)
Total CHOL/HDL Ratio: 2
Triglycerides: 37 mg/dL (ref 0.0–149.0)

## 2011-10-15 LAB — CBC WITH DIFFERENTIAL/PLATELET
Basophils Absolute: 0 10*3/uL (ref 0.0–0.1)
Basophils Relative: 0.6 % (ref 0.0–3.0)
Eosinophils Absolute: 0.1 10*3/uL (ref 0.0–0.7)
Lymphocytes Relative: 33.9 % (ref 12.0–46.0)
MCHC: 34.9 g/dL (ref 30.0–36.0)
Monocytes Relative: 6.1 % (ref 3.0–12.0)
Neutrophils Relative %: 57.9 % (ref 43.0–77.0)
RBC: 4.45 Mil/uL (ref 3.87–5.11)
RDW: 13.7 % (ref 11.5–14.6)

## 2011-10-15 LAB — BASIC METABOLIC PANEL
CO2: 27 mEq/L (ref 19–32)
Calcium: 8.9 mg/dL (ref 8.4–10.5)
Chloride: 106 mEq/L (ref 96–112)
Creatinine, Ser: 0.7 mg/dL (ref 0.4–1.2)
Glucose, Bld: 96 mg/dL (ref 70–99)

## 2011-10-15 LAB — HEPATIC FUNCTION PANEL
ALT: 31 U/L (ref 0–35)
Albumin: 4.2 g/dL (ref 3.5–5.2)
Bilirubin, Direct: 0.1 mg/dL (ref 0.0–0.3)
Total Protein: 7 g/dL (ref 6.0–8.3)

## 2011-10-15 MED ORDER — SIMVASTATIN 20 MG PO TABS: 20.0000 mg | ORAL_TABLET | Freq: Every day | ORAL | Status: DC

## 2011-10-15 MED ORDER — OMEPRAZOLE 20 MG PO CPDR: 20.0000 mg | DELAYED_RELEASE_CAPSULE | Freq: Every day | ORAL | Status: DC

## 2011-10-15 NOTE — Progress Notes (Signed)
Subjective:     Patient ID: [name] [name], female   DOB: 02/19/1951, 61 y.o.   MRN: [id number]  HPI 61 y/o WF here for a follow up visit... she has multiple medical problems as noted below...    ~  September 15, 2009:  here for a 61 yr ROV & CPX... on ASA, Omep, MVI, and ?followed by the [facility] (Tammy Prescott), and her GYN= DrWhite in [place]... her med list now includes Armour Thyroid from Elie Goody, NP;  numerous hormone creams + Progesterone 100mg /d, Aspirin, Niacin, Omeprazole, etc >> all under the direction of Elie Goody, NP at the [facility]... pt has indicated that they did labs etc but we do not have any of this data... she is not fasting today & declines to ret for fasting labs at this time...   ~  June 17, 2010:  she had FLP done here 6/11 on the Simva20 & all parameters were WNL.Marland Kitchen. she called 7/11 c/o feeling light headed w/ HA on top of head, & BP at Pharm was "155/88"... we decided to call in Metoprolol ER 50mg - start 1/2 daily & she reports excellent response w/ BP ret to normal & feeling well... now she would like to try & come off this med> OK to wean slowly off & monitor BP at home... she has CPX planned for 12/11.  ~  September 15, 2010:  she has weaned off the Metoprolol & BP today is 128/80 on diet  alone & she is encouraged... feeling well overall but c/o 3 episodes of "hives" really flushing episodes most likely due to her Niacin Rx> asked to stop this therapy & call if symptoms recur... otherw feeling well w/o new complaints or concerns... she is due for CXR (clear, WNL), EKG (NSR, WNL), & Fasting labs today (all look good)...   ~  October 15, 2011:  Yearly ROV & CPX>  She has had a good yr just c/o some left knee pain w/ hx of dislocation yrs ago she says; hx reveals 2 prev arthroscopies by DrWainer & she is rec to f/u w/ him...  She still takes her bioequivolent hormone rx from [employer] & the [facility]... She has borderline  BP, on diet rx, but doesn't want meds> BP today 140/86 & she is convinced it is due to her diet over the holidays & she is getting back t her usual routine & will f/u BP at home & let me know;  Her only prescription med is Simva20 & FLP looks good w/ all parameters at the goal (see below);  Also takes Armour Thyroid 120mg  & her TSH looks good on this as well...  Her GYN in [place] treated her for a bacterial infection she says...  See prob list below>>          Problem List:   ALLERGY (ICD-995.3) - hx allergies w/ OTC Rx Prn...  Hx of BRONCHITIS (ICD-490) - denies recent cough, phlegm, SOB, etc... ~  CXR 12/11 showed clear lungs, WNL...  HYPERTENSION, BORDERLINE (ICD-401.9) - BP ~140/90 in past & she follows a low sodium, weight reducting diet strategy & wants to stay off medication if poss... started Metop25mg  in 2011 but was able to wean off & BP has remained stable on diet alone. ~  12/10:  BP= 128/80 & similar at home, keep up the good work. ~  7/11: pt called w/ HA, light headed, & BP= 155/88 at Pharm;  METOPROLOL ER 50mg - 1/2  daily started. ~  9/11:  OV doing well on the 25mg  Metoprolol, BP= 110/76 & she wants to wean off if able- OK. ~  12/11: here for CPX, she weaned off the Metoprolol & BP= 128/80 doing well; EKG shows NSR, NAD. ~  1/13:  BP= 140/86 & she thinks it's due to diet over the holidays & doesn't want meds; she will adjust diet & monitor BP.  HYPERCHOLESTEROLEMIA (ICD-272.4) - prev on diet alone, SIMVASTATIN 20mg  started 12/10 w/ good response... ~  FLP 1/06 showed TChol 221, TG 61, HDL 58, LDL 149... rec> diet/ exerc/ get wt down. ~  12/10: we decided to start Simva20 & ret for FLP. ~  FLP 6/11 on Simva20 showed TChol 158, TG 93, HDL 63, LDL 76... continue same. ~  FLP 12/11 on Simva20 showed TChol 167, TG 82, HDL 58, LDL 93 ~  FLP 1/13 on Simva20 showed TChol 156, TG 37, HDL 64, LDL 85  GERD (ICD-530.81) - prev on Nexium 40mg /d & switched to generic OMEPRAZOLE 20mg /d as  needed...   IBS (ICD-564.1) - hx mild IBS symptoms in the past... ~  Colonoscopy 2003 was neg by DrPatterson... ~ 1/13: f/u colonoscopy will be due this yr& she is waiting to hear from DrPatterson...  GYN= DrWhite in [place] w/ PAP, Mammography, BMD's all done by them>> she reports all tests OK... ~ she is on Progesterone 100mg  orally & 3 creams from [facility]...  DEGENERATIVE JOINT DISEASE (ICD-715.90) - hx prev knee arthroscopy x2 by DrWainer...  ~  1/13:  She is c/o persistant left knee pain & will contact DrWainer for f/u exam...  VITAMIN D DEFICIENCY (ICD-268.9) - hx low Vit D level at [facility] (level= 20 by pt hx) and she states started on Vit B12 shots and Vit D supplements (we do not have records & I discussed this with her)... ~  labs 12/11 showed Vit D level = 51... continue Vit D supplement. ~  Labs 1/13 showed Vit D level = 72 & ok to decr her suplements if she wants to...  ANXIETY (ICD-300.00) - she manages well & has not required anxiolytic Rx.  SKIN CANCER, HX OF (ICD-V10.83) - followed by [place] [facility], DrUhlin- hx Acne, Basal cell ca, & Melanoma on left shoulder 2005... she maintains regular f/u w/ Derm for surveillance... ~  2011:  she was started on Oracea for acne...   "[facility]" EVAL & RX >>> she tells me that she saw Dr. Jeni Salles in the past, now NP Elie Goody at [facility]... 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 ? hypothyroidism- on Armour Thyroid 1grain & TSH= ok... now also on "hormone creams= bio-identical hormone replacement"...   Past Surgical History  Procedure Date  . Knee arthroscopy   . Skin surgery 4/05    Melanoma surg left shoulder    Outpatient Encounter Prescriptions as of 10/15/2011  Medication Sig Dispense Refill  . aspirin 325 MG tablet Take 325 mg by mouth daily.      Marland Kitchen b complex vitamins tablet Take 1 tablet by mouth daily.      . Cholecalciferol (VITAMIN D3) 5000  UNITS CAPS Take 1 capsule by mouth daily.      Marland Kitchen DHEA 10 MG TABS Take 5 mg by mouth daily.      Marland Kitchen doxycycline (ORACEA) 40 MG capsule Take 40 mg by mouth every morning.      . Estriol Micronized POWD by Does  not apply route.      . Magnesium (MAGNESIUM GLYCINATE PLUS) 110 MG CAPS Take 200 mg by mouth 2 (two) times daily.      . Multiple Vitamins-Minerals (MULTIVITAMIN PO) Take 1 tablet by mouth daily.      Marland Kitchen omeprazole (PRILOSEC) 20 MG capsule TAKE 1 CAPSULE DAILY  90 capsule  0  . progesterone (PROMETRIUM) 100 MG capsule Take 100 mg by mouth daily.      Marland Kitchen PROGESTERONE, VAGINAL, (CRINONE) 4 % GEL Place 1 mL vaginally daily.      . simvastatin (ZOCOR) 20 MG tablet Take 20 mg by mouth at bedtime.      . Testosterone Propionate (FIRST-TESTOSTERONE MC) 2 % CREA Place 1 mL onto the skin every morning.      . thyroid (ARMOUR THYROID) 120 MG tablet Take 120 mg by mouth daily.      Marland Kitchen DISCONTD: ARMOUR THYROID PO Take by mouth daily.      Marland Kitchen DISCONTD: aspirin 81 MG tablet Take 160 mg by mouth daily.      Marland Kitchen DISCONTD: Calcium 600-200 MG-UNIT per tablet Take 1 tablet by mouth daily.      Marland Kitchen DISCONTD: HYDROcodone-homatropine (HYCODAN) 5-1.5 MG/5ML syrup Take by mouth every 6 (six) hours as needed.      Marland Kitchen DISCONTD: Multiple Vitamin (MULTIVITAMIN) tablet Take 1 tablet by mouth daily.        Allergies  Allergen Reactions  . Flagyl (Metronidazole Hcl)     Stomach upset  . Telithromycin     Current Medications, Allergies, Past Medical History, Past Surgical History, Family History, and Social History were reviewed in [employer] record.    Review of Systems         The patient complains of joint pain, stiffness, and arthritis.  The patient denies fever, chills, sweats, anorexia, fatigue, weakness, malaise, weight loss, sleep disorder, blurring, diplopia, eye irritation, eye discharge, vision loss, eye pain, photophobia, earache, ear discharge, tinnitus, decreased hearing, nasal  congestion, nosebleeds, sore throat, hoarseness, chest pain, palpitations, syncope, dyspnea on exertion, orthopnea, PND, peripheral edema, cough, dyspnea at rest, excessive sputum, hemoptysis, wheezing, pleurisy, nausea, vomiting, diarrhea, constipation, change in bowel habits, abdominal pain, melena, hematochezia, jaundice, gas/bloating, indigestion/heartburn, dysphagia, odynophagia, dysuria, hematuria, urinary frequency, urinary hesitancy, nocturia, incontinence, back pain, joint swelling, muscle cramps, muscle weakness, sciatica, restless legs, leg pain at night, leg pain with exertion, rash, itching, dryness, suspicious lesions, paralysis, paresthesias, seizures, tremors, vertigo, transient blindness, frequent falls, frequent headaches, difficulty walking, depression, anxiety, memory loss, confusion, cold intolerance, heat intolerance, polydipsia, polyphagia, polyuria, unusual weight change, abnormal bruising, bleeding, enlarged lymph nodes, urticaria, allergic rash, hay fever, and recurrent infections.     Objective:   Physical Exam     WD, WN, 61 y/o WF in NAD... GENERAL:  Alert & oriented; pleasant & cooperative... HEENT:  [place]/AT, EOM-wnl, PERRLA, Fundi-benign, EACs-clear, TMs-wnl, NOSE-clear, THROAT-clear & wnl. NECK:  Supple w/ full ROM; no JVD; normal carotid impulses w/o bruits; no thyromegaly or nodules palpated; no lymphadenopathy. CHEST:  Clear to P & A; without wheezes/ rales/ or rhonchi. HEART:  Regular Rhythm; without murmurs/ rubs/ or gallops. ABDOMEN:  Soft & nontender; normal bowel sounds; no organomegaly or masses detected. EXT: without deformities, +mild arthritis left knee; no varicose veins/ venous insuffic/ or edema. NEURO:  CN's intact; motor testing normal; sensory testing normal; gait normal & balance OK. DERM:  No lesions noted; no rash etc... scar left shoulder from prev melanoma surg 2005.  RADIOLOGY  DATA:  Reviewed in the EPIC EMR & discussed w/ the  patient...  LABORATORY DATA:  Reviewed in the EPIC EMR & discussed w/ the patient...   Assessment:     Hx AR & Bronchitis>  She has not had any allergy symptoms or bronchitic infections is quite awhile...  HBP>  Controlled on diet therapy alone which is how she likes it; sl elev recently due to diet over the holidays she says & she will compensate w/ better diet rx...  CHOL>  Stable on diet + Simva20; continue same...  GI> GERD, IBS>  Stable w/o GI complaints; she is due for f/u colon this yr...  DJD>  She has persist left knee complaints & will f/u w/ drWainer...  Vit D Defic>  Vit D level is up to 72 now;  Ok to decr the supplement if she wants...  Anxiety>  Stable, she denies issues & not requiring any meds...     Plan:     Patient's Medications  New Prescriptions   No medications on file  Previous Medications   ASPIRIN 325 MG TABLET    Take 325 mg by mouth daily.   B COMPLEX VITAMINS TABLET    Take 1 tablet by mouth daily.   CHOLECALCIFEROL (VITAMIN D3) 5000 UNITS CAPS    Take 1 capsule by mouth daily.   DHEA 10 MG TABS    Take 5 mg by mouth daily.   DOXYCYCLINE (ORACEA) 40 MG CAPSULE    Take 40 mg by mouth every morning.   ESTRIOL MICRONIZED POWD    by Does not apply route.   MAGNESIUM (MAGNESIUM GLYCINATE PLUS) 110 MG CAPS    Take 200 mg by mouth 2 (two) times daily.   MULTIPLE VITAMINS-MINERALS (MULTIVITAMIN PO)    Take 1 tablet by mouth daily.   PROGESTERONE (PROMETRIUM) 100 MG CAPSULE    Take 100 mg by mouth daily.   PROGESTERONE, VAGINAL, (CRINONE) 4 % GEL    Place 1 mL vaginally daily.   TESTOSTERONE PROPIONATE (FIRST-TESTOSTERONE MC) 2 % CREA    Place 1 mL onto the skin every morning.   THYROID (ARMOUR THYROID) 120 MG TABLET    Take 120 mg by mouth daily.  Modified Medications   Modified Medication Previous Medication   OMEPRAZOLE (PRILOSEC) 20 MG CAPSULE omeprazole (PRILOSEC) 20 MG capsule      Take 1 capsule (20 mg total) by mouth daily.    TAKE 1 CAPSULE  DAILY   SIMVASTATIN (ZOCOR) 20 MG TABLET simvastatin (ZOCOR) 20 MG tablet      Take 1 tablet (20 mg total) by mouth at bedtime.    Take 20 mg by mouth at bedtime.  Discontinued Medications   ARMOUR THYROID PO    Take by mouth daily.   ASPIRIN 81 MG TABLET    Take 160 mg by mouth daily.   CALCIUM 600-200 MG-UNIT PER TABLET    Take 1 tablet by mouth daily.   HYDROCODONE-HOMATROPINE (HYCODAN) 5-1.5 MG/5ML SYRUP    Take by mouth every 6 (six) hours as needed.   MULTIPLE VITAMIN (MULTIVITAMIN) TABLET    Take 1 tablet by mouth daily.

## 2011-10-15 NOTE — Patient Instructions (Signed)
Today we updated your med list in our EPIC system...    Continue your current medications the same...    We refilled your prescriptions per request...  Today we did your follow up fasting blood work...    Please call the [facility] in a few days for your results...    Dial [id number] & when prompted enter your patient number followed by the # symbol...    Your patient number is:  [id number]  Keep up the good work w/ diet & exercise...  Call for any questions...  Let's plan another f/u eval in 1 year's time, sooner if needed for problems.Marland KitchenMarland Kitchen

## 2011-10-16 LAB — VITAMIN D 25 HYDROXY (VIT D DEFICIENCY, FRACTURES): Vit D, 25-Hydroxy: 72 ng/mL (ref 30–89)

## 2011-10-20 ENCOUNTER — Encounter: Payer: Self-pay | Admitting: Pulmonary Disease

## 2011-11-23 LAB — HM DEXA SCAN

## 2011-12-24 ENCOUNTER — Telehealth: Payer: Self-pay | Admitting: Pulmonary Disease

## 2011-12-24 NOTE — Telephone Encounter (Signed)
Spoke with pt. She is c/o prod cough with green sputum, nasal congestion with clear drainage, HA- onset was 4 days ago. She denies any change in her breathing, CP, fever, abd pain or other complaints. Would like something called in. Please advise, thanks! Allergies  Allergen Reactions  . Flagyl (Metronidazole Hcl)     Stomach upset  . Telithromycin

## 2011-12-25 ENCOUNTER — Ambulatory Visit (INDEPENDENT_AMBULATORY_CARE_PROVIDER_SITE_OTHER): Payer: Managed Care, Other (non HMO) | Admitting: Family Medicine

## 2011-12-25 ENCOUNTER — Encounter: Payer: Self-pay | Admitting: Family Medicine

## 2011-12-25 ENCOUNTER — Telehealth: Payer: Self-pay

## 2011-12-25 VITALS — BP 120/88 | Temp 98.5°F | Wt 180.0 lb

## 2011-12-25 DIAGNOSIS — B349 Viral infection, unspecified: Secondary | ICD-10-CM

## 2011-12-25 DIAGNOSIS — B9789 Other viral agents as the cause of diseases classified elsewhere: Secondary | ICD-10-CM

## 2011-12-25 NOTE — Telephone Encounter (Signed)
[facility] Triage Call Report Triage Record Num: [id number] Operator: Griselda Miner Patient Name: [name] [name] Call Date & Time: 12/24/2011 7:16:19PM Patient Phone: [phone] PCP: Alroy Dust Patient Gender: Female PCP Fax : Patient DOB: 1951/01/14 Practice Name: [facility] Reason for Call: Caller: Jeanell/Patient; PCP: Other; CB#: [phone]; Call regarding Cough/Congestion; Started 12/21/11 deeper in throat felt hairy with cough started, 12/22/11 chills with diarrhea which is resolved; 12/23/11 started runny nose. Today ears are stopped up. Nasal drainage is clear, but coughing up green sputum. Afebrile. Has no appetite, but drinking well. Coughing alot especially at night. Triaged using Cough - Adult with disposition to see provider in 24 hrs. Home care advice given. Appt made for [facility], Dr. Marvel Plan 12/25/11 @ 10:30 and given to patient. Protocol(s) Used: Cough - Adult Recommended Outcome per Protocol: See Provider within 24 hours Reason for Outcome: Productive cough with colored sputum (other than clear or white sputum) Care Advice: ~ Use a cool mist humidifier to moisten air. Be sure to clean according to manufacturer's instructions. Limit or avoid exposure to irritants and allergens (e.g. air pollution, smoke/smoking, chemicals, dust, pollen, pet dander, etc.) ~ Call provider if fever greater than 101.5 F (38.6 C) or 100.5 F (38.1C) in an immunocompromised patient (such as diabetes, HIV/AIDS, renal disease, chemotherapy, organ transplant, or chronic steroid use) has not improved in 24 hours. ~ Increase fluids to 8-12 eight oz (1.6 to 2.4 liters) glasses per day, half of them to be water. Soups, popsicles, fruit juices, non-caffeinated sodas (unless restricting sodium intake), jello, broths, decaf teas, etc. are all okay. Warm fluids can be soothing. ~ ~ If you can, stop smoking now and avoid all secondhand smoke. ~ HEALTH PROMOTION / MAINTENANCE Coughing  up mucus or phlegm helps to get rid of an infection. A productive cough should not be stopped. A cough medicine with guaifenesin (Robitussin, Mucinex) can help loosen the mucus. Cough medicine with dextromethorphan (DM) should be avoided. Drinking lots of fluids can help loosen the mucus too, especially warm fluids. ~ 12/24/2011 7:38:57PM Page 1 of 1 [id number]

## 2011-12-25 NOTE — Patient Instructions (Addendum)
Allegra D (off brand, small box) as directed. Mucinex DM for cough/chest sx's. Mix gatorade with water and sip frequently. B.R.A.T. Diet Your doctor has recommended the B.R.A.T. diet for you or your child until the condition improves. This is often used to help control diarrhea and vomiting symptoms. If you or your child can tolerate clear liquids, you may have:  Bananas.   Rice.   Applesauce.   Toast (and other simple starches such as crackers, potatoes, noodles).  Be sure to avoid dairy products, meats, and fatty foods until symptoms are better. Fruit juices such as apple, grape, and prune juice can make diarrhea worse. Avoid these. Continue this diet for 2 days or as instructed by your caregiver. Document Released: 09/20/2005 Document Revised: 09/09/2011 Document Reviewed: 03/09/2007 [facility] Patient Information 2012 [place], [place].

## 2011-12-25 NOTE — Progress Notes (Signed)
OFFICE NOTE  12/25/2011  CC:  Chief Complaint  Patient presents with  . Cough    congestion, some diarrhea     HPI: Patient is a 61 y.o. Caucasian female who is here for chest and head congestion. Pt presents complaining of respiratory symptoms for 4 days.  Mostly nasal congestion/runny nose, mild ST and PND cough.    Lately the cough seems to be improving.  Diarrhea all day one day early in illness, bad HA on and off. Decreased appetite, +chills, ears feel stopped up. No wheezing, and no SOB.  No pain in face or teeth.  No significant HA.  ST mild at most.  Symptoms made worse by nothing.  Symptoms improved by OTC meds a bit, humidifier helps some. Smoker? no Recent sick contact? no Muscle or joint aches? Generalized, yes--mild Flu shot this season at least 2 wks ago? no  ROS: no n/v.  Diarrhea comes back when she tries to eat.  No abd cramps.  No rash.  No neck stiffness.   +Mild fatigue.  +Mild appetite loss.   Pertinent PMH:  Nonsmoker. No hx of asthma.  MEDS: Mucinex DM Outpatient Prescriptions Prior to Visit  Medication Sig Dispense Refill  . progesterone (PROMETRIUM) 100 MG capsule Take 100 mg by mouth daily.      . simvastatin (ZOCOR) 20 MG tablet Take 1 tablet (20 mg total) by mouth at bedtime.  90 tablet  3  . thyroid (ARMOUR THYROID) 120 MG tablet Take 120 mg by mouth daily.      Marland Kitchen aspirin 325 MG tablet Take 325 mg by mouth daily.      Marland Kitchen b complex vitamins tablet Take 1 tablet by mouth daily.      . Cholecalciferol (VITAMIN D3) 5000 UNITS CAPS Take 1 capsule by mouth daily.      Marland Kitchen DHEA 10 MG TABS Take 5 mg by mouth daily.      Marland Kitchen doxycycline (ORACEA) 40 MG capsule Take 40 mg by mouth every morning.      . Estriol Micronized POWD by Does not apply route.      . Magnesium (MAGNESIUM GLYCINATE PLUS) 110 MG CAPS Take 200 mg by mouth 2 (two) times daily.      . Multiple Vitamins-Minerals (MULTIVITAMIN PO) Take 1 tablet by mouth daily.      Marland Kitchen omeprazole (PRILOSEC) 20 MG  capsule Take 1 capsule (20 mg total) by mouth daily.  90 capsule  3  . PROGESTERONE, VAGINAL, (CRINONE) 4 % GEL Place 1 mL vaginally daily.      . Testosterone Propionate (FIRST-TESTOSTERONE MC) 2 % CREA Place 1 mL onto the skin every morning.        PE: Blood pressure 120/88, temperature 98.5 F (36.9 C), temperature source Oral, weight 180 lb (81.647 kg). VS: noted--normal. Gen: alert, NAD, NONTOXIC APPEARING. HEENT: eyes without injection, drainage, or swelling.  Ears: EACs clear, TMs with normal light reflex and landmarks.  Nose: Clear rhinorrhea, with some dried, crusty exudate adherent to mildly injected mucosa.  No purulent d/c.  No paranasal sinus TTP.  No facial swelling.  Throat and mouth without focal lesion.  No pharyngial swelling, erythema, or exudate.   Neck: supple, no LAD.   LUNGS: CTA bilat, nonlabored resps.   CV: RRR, no m/r/g. EXT: no c/c/e SKIN: no rash    IMPRESSION AND PLAN:  Viral syndrome. Self-limited nature of this illness was discussed, questions answered.  Discussed symptomatic care, rest, fluids.   Warning signs/symptoms  of worsening illness were discussed.  Patient instructed to call or return if any of these occur.   FOLLOW UP: prn

## 2011-12-27 NOTE — Assessment & Plan Note (Signed)
Self-limited nature of this illness was discussed, questions answered.  Discussed symptomatic care, rest, fluids.   Warning signs/symptoms of worsening illness were discussed.  Patient instructed to call or return if any of these occur.  

## 2011-12-27 NOTE — Telephone Encounter (Signed)
According to EMR pt was seen by Primary MD on 12-25-11.  LMOMTCB  for pt to see if she is feeling better.

## 2011-12-28 MED ORDER — AMOXICILLIN-POT CLAVULANATE 875-125 MG PO TABS: 1.0000 | ORAL_TABLET | Freq: Two times a day (BID) | ORAL | Status: AC

## 2011-12-28 MED ORDER — METHYLPREDNISOLONE 4 MG PO KIT: PACK | ORAL | Status: AC

## 2011-12-28 NOTE — Telephone Encounter (Signed)
Pt was seen by Dr. Milinda Cave on Saturday at the walk in clinic bc she did not receive a call back from our office.  Reports she was told she has a virus and was advised to do [employer], allegra D, mucinex dm, and chloraseptic for sore throat.  Pt states these medications are working,the cough and chills aren't as bad,  however she still feels "terrible."  C/o still having pressure in ears, mild sore throat, chest congestion, prod cough with yellow mucus, fatigue, no appetite, and chills "come and go."  Denies increased SOB, wheezing, and chest tightness.  I apologized on our behalf for not calling her back on Friday and advised I would send message to Dr. Kriste Basque to see if he has any further recs.  Dr. Kriste Basque, pls advise.  Thank you.  [facility] [place]  Allergies verified  Allergies  Allergen Reactions  . Flagyl (Metronidazole Hcl)     Stomach upset  . Telithromycin

## 2011-12-28 NOTE — Telephone Encounter (Signed)
Per SN----augmentin 875mg   #14  1 po bid until gone, align once daily while on the abx, medrol dosepak  #1  Take as directed.  thanks

## 2011-12-28 NOTE — Telephone Encounter (Signed)
Spoke with pt and notified of recs per SN. Pt verbalized understanding and states nothing further needed. Rx were sent to pharm.

## 2012-05-17 ENCOUNTER — Encounter: Payer: Self-pay | Admitting: Gastroenterology

## 2012-07-11 ENCOUNTER — Other Ambulatory Visit: Payer: Self-pay | Admitting: Orthopedic Surgery

## 2012-07-11 DIAGNOSIS — M25511 Pain in right shoulder: Secondary | ICD-10-CM

## 2012-07-12 ENCOUNTER — Ambulatory Visit
Admission: RE | Admit: 2012-07-12 | Discharge: 2012-07-12 | Disposition: A | Discharge status: Home / Self Care | Payer: Managed Care, Other (non HMO) | Source: Ambulatory Visit | Attending: Orthopedic Surgery | Admitting: Orthopedic Surgery

## 2012-07-12 DIAGNOSIS — M25511 Pain in right shoulder: Secondary | ICD-10-CM

## 2012-10-04 LAB — HM PAP SMEAR: HM Pap smear: NORMAL

## 2012-10-05 ENCOUNTER — Other Ambulatory Visit: Payer: Self-pay | Admitting: Pulmonary Disease

## 2012-10-13 ENCOUNTER — Ambulatory Visit: Payer: Managed Care, Other (non HMO) | Admitting: Pulmonary Disease

## 2012-10-20 ENCOUNTER — Ambulatory Visit: Payer: Managed Care, Other (non HMO) | Admitting: Pulmonary Disease

## 2012-10-29 ENCOUNTER — Other Ambulatory Visit: Payer: Self-pay | Admitting: Pulmonary Disease

## 2012-11-14 ENCOUNTER — Encounter: Payer: Self-pay | Admitting: Pulmonary Disease

## 2012-11-14 ENCOUNTER — Ambulatory Visit (INDEPENDENT_AMBULATORY_CARE_PROVIDER_SITE_OTHER): Payer: Managed Care, Other (non HMO) | Admitting: Pulmonary Disease

## 2012-11-14 VITALS — BP 112/70 | HR 78 | Temp 98.1°F | Ht 65.5 in | Wt 171.2 lb

## 2012-11-14 DIAGNOSIS — K589 Irritable bowel syndrome without diarrhea: Secondary | ICD-10-CM

## 2012-11-14 DIAGNOSIS — E559 Vitamin D deficiency, unspecified: Secondary | ICD-10-CM

## 2012-11-14 DIAGNOSIS — Z0000 Encounter for general adult medical examination without abnormal findings: Secondary | ICD-10-CM

## 2012-11-14 DIAGNOSIS — R251 Tremor, unspecified: Secondary | ICD-10-CM

## 2012-11-14 DIAGNOSIS — M1990 Unspecified osteoarthritis, unspecified site: Secondary | ICD-10-CM

## 2012-11-14 DIAGNOSIS — I10 Essential (primary) hypertension: Secondary | ICD-10-CM

## 2012-11-14 DIAGNOSIS — R259 Unspecified abnormal involuntary movements: Secondary | ICD-10-CM

## 2012-11-14 DIAGNOSIS — K219 Gastro-esophageal reflux disease without esophagitis: Secondary | ICD-10-CM

## 2012-11-14 DIAGNOSIS — E785 Hyperlipidemia, unspecified: Secondary | ICD-10-CM

## 2012-11-14 NOTE — Patient Instructions (Addendum)
Today we updated your med list in our EPIC system...    Continue your current medications the same...  Please return to our lab one morning soon for your FASTING blood work...    We will contact you w/ the results when avail...  We wrote a prescription for the Shingles vaccine which you can fill at [facility] or [facility] shot [facility]...  We will arrange for a Neurology consult regarding your tremor ...  Call for any questions or if we can be of service in any way...  Let's plan a similar check up in 1 yrs time, sooner if needed for problems.Marland KitchenMarland Kitchen

## 2012-11-14 NOTE — Progress Notes (Addendum)
Subjective:     Patient ID: [name] [name], female   DOB: [birth year], 62 y.o.   MRN: [id number]  HPI 62 y/o WF here for a follow up visit... she has multiple medical problems as noted below...    ~  June 17, 2010:  she had FLP done here 6/11 on the Simva20 & all parameters were WNL.Marland Kitchen. she called 7/11 c/o feeling light headed w/ HA on top of head, & BP at Pharm was "155/88"... we decided to call in Metoprolol ER 50mg - start 1/2 daily & she reports excellent response w/ BP ret to normal & feeling well... now she would like to try & come off this med> OK to wean slowly off & monitor BP at home... she has CPX planned for 12/11.  ~  September 15, 2010:  she has weaned off the Metoprolol & BP today is 128/80 on diet  alone & she is encouraged... feeling well overall but c/o 3 episodes of "hives" really flushing episodes most likely due to her Niacin Rx> asked to stop this therapy & call if symptoms recur... otherw feeling well w/o new complaints or concerns... she is due for CXR (clear, WNL), EKG (NSR, WNL), & Fasting labs today (all look good)...   ~  October 15, 2011:  Yearly ROV & CPX>  She has had a good yr just c/o some left knee pain w/ hx of dislocation yrs ago she says; hx reveals 2 prev arthroscopies by DrWainer & she is rec to f/u w/ him...  She still takes her bioequivolent hormone rx from [employer] [facility] [employer]... She has borderline BP, on diet rx, but doesn't want meds> BP today 140/86 & she is convinced it is due to her diet over the holidays & she is getting back t her usual routine & will f/u BP at home & let me know;  Her only prescription med is Simva20 & FLP looks good w/ all parameters at the goal (see below);  Also takes Armour Thyroid 120mg  & her TSH looks good on this as well...  Her GYN in [place] treated her for a bacterial infection she says...  See prob list below>>  ~  November 14, 2012:  Yearly ROV & CPX> [name] [name] reports that DrWainer did right shoulder surg for  bone spurs & rotator cuff tear 11/13- at outpt center & no records in Epic; she has been doing PT & much improved; now notes left knee prob w/ eval 2013 by DrWainer showing bone spurs, bone on bone, etc- on Mobic prn;  Her CC today is onset of a mild intermittent tremor, mother had it quite severely in her old age & we discussed Neuro eval of this likely familial tremor...    [name] [name] has noted some vag spotting & has seen her GYN- Dr. Arnette Schaumann in [place] w/ several tests pending at this time...    We reviewed prob list, meds, xrays and labs> see below for updates >>  LABS 2/14:  FLP- at goals on Simva20;  Chems- wnl;  CBC- wnl;  TSH=0.92...          Problem List:   ALLERGY (ICD-995.3) - hx allergies w/ OTC Rx Prn...  Hx of BRONCHITIS (ICD-490) - denies recent cough, phlegm, SOB, etc... ~  CXR 12/11 showed clear lungs, WNL...  HYPERTENSION, BORDERLINE (ICD-401.9) - BP ~140/90 in past & she follows a low sodium, weight reducting diet strategy & wants to stay off medication if poss... started Metop25mg  in 2011  but was able to wean off & BP has remained stable on diet alone. ~  12/10:  BP= 128/80 & similar at home, keep up the good work. ~  7/11: pt called w/ HA, light headed, & BP= 155/88 at Pharm;  Metoprolol ER 50mg - 1/2 daily started. ~  9/11:  OV doing well on the 25mg  Metoprolol, BP= 110/76 & she wants to wean off if able- OK. ~  12/11: here for CPX, she weaned off the Metoprolol & BP= 128/80 doing well; EKG shows NSR, NAD. ~  1/13:  BP= 140/86 & she thinks it's due to diet over the holidays & doesn't want meds; she will adjust diet & monitor BP. ~  2/14:  BP= 112/70 today & she is very pleased; on diet alone & denies CP, palpit, SOB, edema, etc...  HYPERCHOLESTEROLEMIA (ICD-272.4) - prev on diet alone, SIMVASTATIN 20mg  started 12/10 w/ good response... ~  FLP 1/06 showed TChol 221, TG 61, HDL 58, LDL 149... rec> diet/ exerc/ get wt down. ~  12/10: we decided to start Simva20 & ret for FLP. ~   FLP 6/11 on Simva20 showed TChol 158, TG 93, HDL 63, LDL 76... continue same. ~  FLP 12/11 on Simva20 showed TChol 167, TG 82, HDL 58, LDL 93 ~  FLP 1/13 on Simva20 showed TChol 156, TG 37, HDL 64, LDL 85 ~  FLP 2/14 on Simva20 showed TChol 153, TG 59, HDL 63, LDL 78  THYROID MEDICATION >> she is on Armour Thyroid from [employer] [facility] the [employer]; we do not have their lab data to know if she is being treated for hypothyroidism found on lab testing or for some other diagnosis... ~  1/13:  Records show that she is receiving Armour Thyroid 120mg /d... ~  2/14:  [name] [name] reports that she was changed to Armour Thyroid 90mg /d this yr => TSH= 0.92, continue same   GERD (ICD-530.81) - prev on Nexium 40mg /d & switched to generic OMEPRAZOLE 20mg /d as needed...   IBS (ICD-564.1) - hx mild IBS symptoms in the past... ~  Colonoscopy 2003 was neg by DrPatterson... ~ 1/13: f/u colonoscopy will be due this yr& she is waiting to hear from DrPatterson... ~  2/14: she has not yet sched her f/u colonoscopy due to recent shoulder surg etc; she promises to sched this as soon as practical...  GYN= DrWhite in [place] w/ PAP, Mammography, BMD's all done by them>> we do not have data... ~ she is on Progesterone 100mg  orally & 3 creams from [facility]... ~  2/14: she reports vaginal spotting recently & DrWhite is planning several tests for further evaluation...  DEGENERATIVE JOINT DISEASE (ICD-715.90) - hx prev knee arthroscopy x2 by DrWainer...  ~  1/13:  She is c/o persistant left knee pain & will contact DrWainer for f/u exam...  ~  11/13: she had right shoulder surg by DrWainer for spurs, & rotator cuff tear...  VITAMIN D DEFICIENCY (ICD-268.9) - hx low Vit D level at wellness center (level= 20 by pt hx) and she states started on Vit B12 shots and Vit D supplements (we do not have records & I discussed this with her)... ~  labs 12/11 showed Vit D level = 51... continue Vit D  supplement. ~  Labs 1/13 showed Vit D level = 72 & ok to decr her suplements if she wants to (she has continued the 5000u daily supplement)...  TREMOR >> she noted a mild intermittent right arm  tremor 2/14 & we discussed referral to Neuro in light of her pos Fam Hx of tremor (in mother- quite severe)...  ANXIETY (ICD-300.00) - she manages well & has not required anxiolytic Rx.  SKIN CANCER, HX OF (ICD-V10.83) - followed by [place] [facility], DrUhlin- hx Acne, Basal cell ca, & Melanoma on left shoulder 2005... she maintains regular f/u w/ Derm for surveillance... ~  2011:  she was started on Oracea for acne...   "[facility]" EVAL & RX >>> she tells me that she saw Dr. Jeni Salles in the past, now NP Elie Goody at [facility]... 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 ? hypothyroidism- on Armour Thyroid & TSH= ok... now also on "hormone creams= bio-identical hormone replacement"... ~  1/13: She takes Armour Thyroid 120mg  & her TSH looks good on this as well... ~  1/14: [name] [name] tells me that she is now taking Armour Thyroid 90mg /d from [facility]...  NOTE>> pt gets the yearly seasonal flu vaccines;  She had TDAP in 2013 due to new grandchild;  Rx written for shingles vaccine (& she will check w/ insurance co);  She will be a candidate for Pneumovax at 66...   Past Surgical History  Procedure Laterality Date  . Knee arthroscopy    . Skin surgery  4/05    Melanoma surg left shoulder    Outpatient Encounter Prescriptions as of 11/14/2012  Medication Sig Dispense Refill  . aspirin 325 MG tablet Take 325 mg by mouth daily.      Marland Kitchen b complex vitamins tablet Take 1 tablet by mouth daily.      . Cholecalciferol (VITAMIN D3) 5000 UNITS CAPS Take 1 capsule by mouth daily.      Marland Kitchen DHEA 10 MG TABS Take 5 mg by mouth daily.      Marland Kitchen doxycycline (ORACEA) 40 MG capsule Take 40 mg by mouth every morning.      . Estriol Micronized POWD by Does not apply route.      Marland Kitchen  HYDROcodone-acetaminophen (NORCO/VICODIN) 5-325 MG per tablet Take 1 tablet by mouth as needed.      . Magnesium (MAGNESIUM GLYCINATE PLUS) 110 MG CAPS Take 200 mg by mouth 2 (two) times daily.      . meloxicam (MOBIC) 15 MG tablet Take 1 tablet by mouth daily.      . Multiple Vitamins-Minerals (MULTIVITAMIN PO) Take 1 tablet by mouth daily.      Marland Kitchen omeprazole (PRILOSEC) 20 MG capsule TAKE 1 CAPSULE DAILY  90 capsule  0  . progesterone (PROMETRIUM) 100 MG capsule Take 100 mg by mouth daily.      . simvastatin (ZOCOR) 20 MG tablet TAKE 1 TABLET AT BEDTIME  90 tablet  0  . Testosterone Propionate (FIRST-TESTOSTERONE MC) 2 % CREA Place 1 mL onto the skin every morning.      . thyroid (ARMOUR THYROID) 120 MG tablet Take 120 mg by mouth daily.      . [DISCONTINUED] PROGESTERONE, VAGINAL, (CRINONE) 4 % GEL Place 1 mL vaginally daily.       No facility-administered encounter medications on file as of 11/14/2012.    Allergies  Allergen Reactions  . Flagyl (Metronidazole Hcl)     Stomach upset  . Telithromycin     Current Medications, Allergies, Past Medical History, Past Surgical History, Family History, and Social History were reviewed in [employer] record.    Review of Systems         The  patient complains of joint pain, stiffness, and arthritis.  The patient denies fever, chills, sweats, anorexia, fatigue, weakness, malaise, weight loss, sleep disorder, blurring, diplopia, eye irritation, eye discharge, vision loss, eye pain, photophobia, earache, ear discharge, tinnitus, decreased hearing, nasal congestion, nosebleeds, sore throat, hoarseness, chest pain, palpitations, syncope, dyspnea on exertion, orthopnea, PND, peripheral edema, cough, dyspnea at rest, excessive sputum, hemoptysis, wheezing, pleurisy, nausea, vomiting, diarrhea, constipation, change in bowel habits, abdominal pain, melena, hematochezia, jaundice, gas/bloating, indigestion/heartburn, dysphagia,  odynophagia, dysuria, hematuria, urinary frequency, urinary hesitancy, nocturia, incontinence, back pain, joint swelling, muscle cramps, muscle weakness, sciatica, restless legs, leg pain at night, leg pain with exertion, rash, itching, dryness, suspicious lesions, paralysis, paresthesias, seizures, tremors, vertigo, transient blindness, frequent falls, frequent headaches, difficulty walking, depression, anxiety, memory loss, confusion, cold intolerance, heat intolerance, polydipsia, polyphagia, polyuria, unusual weight change, abnormal bruising, bleeding, enlarged lymph nodes, urticaria, allergic rash, hay fever, and recurrent infections.     Objective:   Physical Exam      WD, WN, 62 y/o WF in NAD... GENERAL:  Alert & oriented; pleasant & cooperative... HEENT:  [place]/AT, EOM-wnl, PERRLA, Fundi-benign, EACs-clear, TMs-wnl, NOSE-clear, THROAT-clear & wnl. NECK:  Supple w/ full ROM; no JVD; normal carotid impulses w/o bruits; no thyromegaly or nodules palpated; no lymphadenopathy. CHEST:  Clear to P & A; without wheezes/ rales/ or rhonchi. HEART:  Regular Rhythm; without murmurs/ rubs/ or gallops. ABDOMEN:  Soft & nontender; normal bowel sounds; no organomegaly or masses detected. EXT: without deformities, +mild arthritis left knee; no varicose veins/ venous insuffic/ or edema. Sl decr ROM in right shoulder but steadily improving w/ PT she says... NEURO:  CN's intact; motor testing normal; sensory testing normal; gait normal & balance OK. DERM:  No lesions noted; no rash etc... scar left shoulder from prev melanoma surg 2005.  RADIOLOGY DATA:  Reviewed in the EPIC EMR & discussed w/ the patient...  LABORATORY DATA:  Reviewed in the EPIC EMR & discussed w/ the patient...   Assessment:      Hx AR & Bronchitis>  She has not had any allergy symptoms or bronchitic infections is quite awhile...  HBP>  Controlled on diet therapy alone which is how she likes it...  CHOL>  Stable on diet + Simva20;  continue same...  GI> GERD, IBS>  Stable w/o GI complaints; she is due for f/u colon & she will call to set this up...  DJD>  She has persist left knee complaints & recent right shoulder surg by DrWainer...  Vit D Defic>  Vit D level was up to 72- [id number];  She decided to continue same dose...  TREMOR>  There is a Hx of tremor in her Mom, making this a familial tremor; we will refer to Neuro...  Anxiety>  Stable, she denies issues & not requiring any meds...     Plan:     Patient's Medications  New Prescriptions   No medications on file  Previous Medications   ASPIRIN 325 MG TABLET    Take 325 mg by mouth daily.   B COMPLEX VITAMINS TABLET    Take 1 tablet by mouth daily.   CHOLECALCIFEROL (VITAMIN D3) 5000 UNITS CAPS    Take 1 capsule by mouth daily.   DHEA 10 MG TABS    Take 5 mg by mouth daily.   DOXYCYCLINE (ORACEA) 40 MG CAPSULE    Take 40 mg by mouth every morning.   ESTRIOL MICRONIZED POWD    by Does not apply route.  HYDROCODONE-ACETAMINOPHEN (NORCO/VICODIN) 5-325 MG PER TABLET    Take 1 tablet by mouth as needed.   MAGNESIUM (MAGNESIUM GLYCINATE PLUS) 110 MG CAPS    Take 200 mg by mouth 2 (two) times daily.   MELOXICAM (MOBIC) 15 MG TABLET    Take 1 tablet by mouth daily.   MULTIPLE VITAMINS-MINERALS (MULTIVITAMIN PO)    Take 1 tablet by mouth daily.   OMEPRAZOLE (PRILOSEC) 20 MG CAPSULE    TAKE 1 CAPSULE DAILY   PROGESTERONE (PROMETRIUM) 100 MG CAPSULE    Take 100 mg by mouth daily.   SIMVASTATIN (ZOCOR) 20 MG TABLET    TAKE 1 TABLET AT BEDTIME   TESTOSTERONE PROPIONATE (FIRST-TESTOSTERONE MC) 2 % CREA    Place 1 mL onto the skin every morning.   THYROID (ARMOUR THYROID) 120 MG TABLET    Take 120 mg by mouth daily.  Modified Medications   No medications on file  Discontinued Medications   PROGESTERONE, VAGINAL, (CRINONE) 4 % GEL    Place 1 mL vaginally daily.

## 2012-11-21 ENCOUNTER — Other Ambulatory Visit (INDEPENDENT_AMBULATORY_CARE_PROVIDER_SITE_OTHER): Payer: Managed Care, Other (non HMO)

## 2012-11-21 DIAGNOSIS — I10 Essential (primary) hypertension: Secondary | ICD-10-CM

## 2012-11-21 DIAGNOSIS — E785 Hyperlipidemia, unspecified: Secondary | ICD-10-CM

## 2012-11-21 DIAGNOSIS — Z0000 Encounter for general adult medical examination without abnormal findings: Secondary | ICD-10-CM

## 2012-11-21 LAB — CBC WITH DIFFERENTIAL/PLATELET
Basophils Relative: 0.5 % (ref 0.0–3.0)
Eosinophils Relative: 2.4 % (ref 0.0–5.0)
HCT: 40.8 % (ref 36.0–46.0)
Hemoglobin: 13.9 g/dL (ref 12.0–15.0)
Lymphs Abs: 1.6 10*3/uL (ref 0.7–4.0)
MCV: 91.6 fl (ref 78.0–100.0)
Monocytes Absolute: 0.4 10*3/uL (ref 0.1–1.0)
Neutro Abs: 3.2 10*3/uL (ref 1.4–7.7)
Platelets: 214 10*3/uL (ref 150.0–400.0)
RBC: 4.46 Mil/uL (ref 3.87–5.11)
WBC: 5.4 10*3/uL (ref 4.5–10.5)

## 2012-11-21 LAB — BASIC METABOLIC PANEL
BUN: 19 mg/dL (ref 6–23)
Calcium: 8.8 mg/dL (ref 8.4–10.5)
GFR: 88.95 mL/min (ref >60.00)
Potassium: 4.2 mEq/L (ref 3.5–5.1)

## 2012-11-21 LAB — HEPATIC FUNCTION PANEL
AST: 19 U/L (ref 0–37)
Total Bilirubin: 0.5 mg/dL (ref 0.3–1.2)

## 2012-11-21 LAB — LIPID PANEL
Cholesterol: 153 mg/dL (ref 0–200)
LDL Cholesterol: 78 mg/dL (ref 0–99)
VLDL: 11.8 mg/dL (ref 0.0–40.0)

## 2012-11-23 ENCOUNTER — Telehealth: Payer: Self-pay | Admitting: Pulmonary Disease

## 2012-11-23 NOTE — Telephone Encounter (Signed)
Please notify patient>  FLP looks good on Simva20... Chems, CBC> all wnl... Thyroid look s good on her current dose of Amour thyroid- continue same...  Spoke with pt and notified of results per Dr. Kriste Basque . Pt verbalized understanding and denied any questions.

## 2012-11-28 ENCOUNTER — Telehealth: Payer: Self-pay | Admitting: Pulmonary Disease

## 2012-11-28 NOTE — Progress Notes (Signed)
Quick Note:  Pt aware per 2.20.14 phone note ______

## 2012-11-28 NOTE — Telephone Encounter (Signed)
Per pt's chart, she was notified of her lab results by Verlon Au on 2.20.14 Verified with Marliss Czar that she was not aware of this Called spoke with patient, advised that nothing further is needed Will sign off Result note documented that pt is aware of results/recs as stated by SN

## 2013-01-01 ENCOUNTER — Telehealth: Payer: Self-pay | Admitting: Pulmonary Disease

## 2013-01-01 MED ORDER — AMOXICILLIN-POT CLAVULANATE 875-125 MG PO TABS: 1.0000 | ORAL_TABLET | Freq: Two times a day (BID) | ORAL | Status: DC

## 2013-01-01 NOTE — Telephone Encounter (Signed)
Spoke with pt having a lot of chest congestion, sinus drainage, wheezing.  Pt would like something called in . Allergies  Allergen Reactions  . Flagyl (Metronidazole Hcl)     Stomach upset  . Telithromycin    Dr Kriste Basque Please advise  Thank you

## 2013-01-01 NOTE — Telephone Encounter (Signed)
Pt advised of recs and rx sent. Jennifer Castillo, CMA  

## 2013-01-01 NOTE — Telephone Encounter (Signed)
Per SN----  augmentin 875 mg  #14  1 po bid   Align once daily mucinex 600 mg  2 po bid Increase fluid intake

## 2013-01-03 ENCOUNTER — Telehealth: Payer: Self-pay | Admitting: Pulmonary Disease

## 2013-01-03 MED ORDER — CIPROFLOXACIN HCL 500 MG PO TABS: 500.0000 mg | ORAL_TABLET | Freq: Two times a day (BID) | ORAL | Status: DC

## 2013-01-03 NOTE — Telephone Encounter (Signed)
Patient started augmentin Monday 01/01/13 Then started the Align on Tues. 01/02/13 along with the Augmentin after breakfast. Patient states since then she Has had so much gas and was up all last night with diarrhea. Patient would like to know if there is any other medication Dr. Kriste Basque could prescribe for her that would not cause so much diarrhea/gas? Dr. Kriste Basque please advise, thank you!  Phone Note from 01/01/13 Marcellus Scott, CMA at 01/01/2013 11:21 AM   Status: Signed            Per SN----  augmentin 875 mg #14 1 po bid  Align once daily  mucinex 600 mg 2 po bid  Increase fluid intake     Alecia Lemming, CMA at 01/01/2013 10:09 AM    Status: Signed              Spoke with pt having a lot of chest congestion, sinus drainage, wheezing.   Pt would like something called in .  -------------------------------  Last OV: 2.11.14 (w 1 yr f/u) Next OV: 2.11.15  Allergies  Allergen Reactions  . Flagyl (Metronidazole Hcl)     Stomach upset  . Telithromycin

## 2013-01-03 NOTE — Telephone Encounter (Signed)
Per SN----  Stop the augmentin and call in cipro 500 mg  Bid  #14   Until gone.  thanks

## 2013-01-03 NOTE — Telephone Encounter (Signed)
I spoke with pt and is aware of SN recs. RX has been sent to the pharmacy. Nothing further was needed

## 2013-01-11 ENCOUNTER — Encounter: Payer: Self-pay | Admitting: Gastroenterology

## 2013-04-08 ENCOUNTER — Other Ambulatory Visit: Payer: Self-pay | Admitting: Pulmonary Disease

## 2013-04-09 ENCOUNTER — Other Ambulatory Visit: Payer: Self-pay | Admitting: Pulmonary Disease

## 2013-05-15 ENCOUNTER — Encounter: Payer: Self-pay | Admitting: Gastroenterology

## 2013-06-20 ENCOUNTER — Ambulatory Visit (AMBULATORY_SURGERY_CENTER): Payer: Self-pay | Admitting: *Deleted

## 2013-06-20 VITALS — Ht 66.0 in | Wt 168.0 lb

## 2013-06-20 DIAGNOSIS — Z1211 Encounter for screening for malignant neoplasm of colon: Secondary | ICD-10-CM

## 2013-06-20 MED ORDER — MOVIPREP 100 G PO SOLR: 1.0000 | Freq: Once | ORAL | Status: DC

## 2013-06-20 NOTE — Progress Notes (Signed)
Patient denies any allergies to eggs or soy. Patient denies any problems with anesthesia.  

## 2013-06-22 ENCOUNTER — Encounter: Payer: Self-pay | Admitting: Gastroenterology

## 2013-07-03 ENCOUNTER — Encounter: Payer: Self-pay | Admitting: Gastroenterology

## 2013-07-03 ENCOUNTER — Ambulatory Visit (AMBULATORY_SURGERY_CENTER): Payer: Managed Care, Other (non HMO) | Admitting: Gastroenterology

## 2013-07-03 ENCOUNTER — Telehealth: Payer: Self-pay | Admitting: *Deleted

## 2013-07-03 ENCOUNTER — Telehealth: Payer: Self-pay | Admitting: Gastroenterology

## 2013-07-03 VITALS — BP 110/70 | HR 66 | Temp 96.4°F | Resp 15 | Ht 66.0 in | Wt 158.0 lb

## 2013-07-03 DIAGNOSIS — D126 Benign neoplasm of colon, unspecified: Secondary | ICD-10-CM

## 2013-07-03 DIAGNOSIS — K5730 Diverticulosis of large intestine without perforation or abscess without bleeding: Secondary | ICD-10-CM

## 2013-07-03 DIAGNOSIS — Z1211 Encounter for screening for malignant neoplasm of colon: Secondary | ICD-10-CM

## 2013-07-03 MED ORDER — SODIUM CHLORIDE 0.9 % IV SOLN: 500.0000 mL | INTRAVENOUS | Status: DC

## 2013-07-03 NOTE — Progress Notes (Signed)
Called to room to assist during endoscopic procedure.  Patient ID and intended procedure confirmed with present staff. Received instructions for my participation in the procedure from the performing physician.  

## 2013-07-03 NOTE — Patient Instructions (Addendum)

## 2013-07-03 NOTE — Progress Notes (Signed)
Report to pacu rn, vss, bbs=clear 

## 2013-07-03 NOTE — Telephone Encounter (Signed)
Spoke with patient and she stated that she is still having bm's, and that her bottom was raw.  I told her to just let the loose bm's run their course, and to buy some desitin ointment for her sore bottom.  She said that she flet fine outside of going to the bathroom and her sore bottom.   She said that she would send someone to the store, and she thanked me.

## 2013-07-03 NOTE — Op Note (Signed)
[facility] [address] [place], [address]   COLONOSCOPY PROCEDURE REPORT  PATIENT: [name], [name]  MR#: [id number] BIRTHDATE: 1951-08-01 , 62  yrs. old GENDER: Female ENDOSCOPIST: Mardella Layman, MD, [facility] REFERRED BY: PROCEDURE DATE:  07/03/2013 PROCEDURE:   Colonoscopy with biopsy First Screening Colonoscopy - Avg.  risk and is 50 yrs.  old or older - No.      History of Adenoma - Now for follow-up colonoscopy & has been > or = to 3 yrs.  N/A  Polyps Removed Today? Yes. ASA CLASS:   Class II INDICATIONS:Average risk patient for colon cancer and average risk screening. MEDICATIONS: propofol (Diprivan) 250mg  IV  DESCRIPTION OF PROCEDURE:   After the risks benefits and alternatives of the procedure were thoroughly explained, informed consent was obtained.  A digital rectal exam revealed no abnormalities of the rectum.   The [id number]  endoscope was introduced through the anus and advanced to the cecum, which was identified by both the appendix and ileocecal valve. No adverse events experienced.   The quality of the prep was good, using MoviPrep  The instrument was then slowly withdrawn as the colon was fully examined.      COLON FINDINGS: A firm and smooth flat polyp ranging between 3-56mm in size was found at the cecum.  A biopsy was performed using cold forceps.   Mild diverticulosis was noted in the sigmoid colon. The colon was otherwise normal.  There was no diverticulosis, inflammation, polyps or cancers unless previously stated. Retroflexed views revealed no abnormalities. The time to cecum=3 minutes 05 seconds.  Withdrawal time=6 minutes 49 seconds.  The scope was withdrawn and the procedure completed. COMPLICATIONS: There were no complications.  ENDOSCOPIC IMPRESSION: 1.   Flat polyp ranging between 3-9mm in size was found at the cecum; biopsy was performed using cold forceps 2.   Mild diverticulosis was noted in the sigmoid  colon 3.   The colon was otherwise normal  RECOMMENDATIONS: 1.  Repeat colonoscopy in 5 years if polyp adenomatous; otherwise 10 years 2.  Await pathology results 3.  Continue current medications 4.  High fiber diet   eSigned:  Mardella Layman, MD, [facility] 07/03/2013 11:08 AM   cc: Michele Mcalpine, MD   PATIENT NAME:  [name], [name] MR#: [id number]

## 2013-07-04 ENCOUNTER — Telehealth: Payer: Self-pay | Admitting: *Deleted

## 2013-07-04 NOTE — Telephone Encounter (Signed)
  Follow up Call-  Call back number 07/03/2013  Post procedure Call Back phone  # [phone]  Permission to leave phone message Yes     Patient questions:  Do you have a fever, pain , or abdominal swelling? no Pain Score  0 *  Have you tolerated food without any problems? yes  Have you been able to return to your normal activities? yes  Do you have any questions about your discharge instructions: Diet   no Medications  no Follow up visit  no  Do you have questions or concerns about your Care? no  Actions: * If pain score is 4 or above: No action needed, pain <4.  Patient stating she did have a lot of "gas" until 0400 today. Patient denies fever, pain or bleeding.

## 2013-07-04 NOTE — Telephone Encounter (Signed)
See return phone call from 07/03/13 at 1657 made by Clide Cliff RN.

## 2013-07-09 ENCOUNTER — Encounter: Payer: Self-pay | Admitting: Gastroenterology

## 2013-08-09 ENCOUNTER — Encounter: Payer: Self-pay | Admitting: Podiatry

## 2013-08-09 ENCOUNTER — Ambulatory Visit (INDEPENDENT_AMBULATORY_CARE_PROVIDER_SITE_OTHER): Payer: Managed Care, Other (non HMO) | Admitting: Podiatry

## 2013-08-09 VITALS — BP 137/75 | HR 69 | Resp 16 | Ht 66.0 in | Wt 161.0 lb

## 2013-08-09 DIAGNOSIS — M7750 Other enthesopathy of unspecified foot: Secondary | ICD-10-CM

## 2013-08-09 NOTE — Progress Notes (Signed)
Subjective:     Patient ID: [name] [name], female   DOB: [birth year], 62 y.o.   MRN: [id number]  HPI patient states my heel is still hurting mostly on the outside and near the back of the Achilles tendon. No where near as bad as originally but still sore   Review of Systems     Objective:   Physical Exam  Nursing note and vitals reviewed. Constitutional: She is oriented to person, place, and time.  Cardiovascular: Intact distal pulses.   Musculoskeletal: Normal range of motion.  Neurological: She is oriented to person, place, and time.   patient is found to have mild discomfort but quite improved over previously with no pain in the original medial fascia     Assessment:     Compensation pain in the right lateral heel and lateral Achilles tendon    Plan:     Orthotics dispensed with instructions on usage and physical therapy discussed with patient

## 2013-08-09 NOTE — Patient Instructions (Signed)

## 2013-09-13 ENCOUNTER — Ambulatory Visit: Payer: Managed Care, Other (non HMO) | Admitting: Podiatry

## 2013-09-20 ENCOUNTER — Ambulatory Visit (INDEPENDENT_AMBULATORY_CARE_PROVIDER_SITE_OTHER): Payer: Managed Care, Other (non HMO) | Admitting: Podiatry

## 2013-09-20 ENCOUNTER — Encounter: Payer: Self-pay | Admitting: Podiatry

## 2013-09-20 VITALS — BP 126/74 | HR 79 | Resp 12

## 2013-09-20 DIAGNOSIS — M7750 Other enthesopathy of unspecified foot: Secondary | ICD-10-CM

## 2013-09-20 NOTE — Progress Notes (Signed)
Subjective:     Patient ID: [name] [name], female   DOB: [birth year], 63 y.o.   MRN: [id number]  HPI patient states my foot is doing much better left and I allowed my orthotics and wants a second pair   Review of Systems     Objective:   Physical Exam Neurovascular status intact with no health history issues and discomfort of a significant reduction lateral left foot    Assessment:     Improved from tendinitis with orthotics fitted well    Plan:     Do to usage we are making her second pair to try to reduce stress against her feet for shoes that these orthotics do not fit in. I believe it is very important for her to be in orthotic therapy

## 2013-10-17 ENCOUNTER — Encounter: Payer: Self-pay | Admitting: Podiatry

## 2013-11-06 ENCOUNTER — Telehealth: Payer: Self-pay | Admitting: Pulmonary Disease

## 2013-11-06 DIAGNOSIS — I10 Essential (primary) hypertension: Secondary | ICD-10-CM

## 2013-11-06 DIAGNOSIS — E559 Vitamin D deficiency, unspecified: Secondary | ICD-10-CM

## 2013-11-06 DIAGNOSIS — F411 Generalized anxiety disorder: Secondary | ICD-10-CM

## 2013-11-06 DIAGNOSIS — E785 Hyperlipidemia, unspecified: Secondary | ICD-10-CM

## 2013-11-06 NOTE — Telephone Encounter (Signed)
Pt aware lab orders placed. Nothing further needed

## 2013-11-06 NOTE — Telephone Encounter (Signed)
Per SN---  Lip BMP Hepat cbcd tsh Vit d

## 2013-11-06 NOTE — Telephone Encounter (Signed)
Please advise SN thanks 

## 2013-11-14 ENCOUNTER — Ambulatory Visit (INDEPENDENT_AMBULATORY_CARE_PROVIDER_SITE_OTHER)
Admission: RE | Admit: 2013-11-14 | Discharge: 2013-11-14 | Disposition: A | Discharge status: Home / Self Care | Payer: Managed Care, Other (non HMO) | Source: Ambulatory Visit | Attending: Pulmonary Disease | Admitting: Pulmonary Disease

## 2013-11-14 ENCOUNTER — Ambulatory Visit (INDEPENDENT_AMBULATORY_CARE_PROVIDER_SITE_OTHER): Payer: Managed Care, Other (non HMO) | Admitting: Pulmonary Disease

## 2013-11-14 ENCOUNTER — Other Ambulatory Visit (INDEPENDENT_AMBULATORY_CARE_PROVIDER_SITE_OTHER): Payer: Managed Care, Other (non HMO)

## 2013-11-14 VITALS — BP 134/88 | HR 72 | Temp 97.1°F | Ht 65.5 in | Wt 177.2 lb

## 2013-11-14 DIAGNOSIS — K589 Irritable bowel syndrome without diarrhea: Secondary | ICD-10-CM

## 2013-11-14 DIAGNOSIS — Z0000 Encounter for general adult medical examination without abnormal findings: Secondary | ICD-10-CM

## 2013-11-14 DIAGNOSIS — F411 Generalized anxiety disorder: Secondary | ICD-10-CM

## 2013-11-14 DIAGNOSIS — I10 Essential (primary) hypertension: Secondary | ICD-10-CM

## 2013-11-14 DIAGNOSIS — E785 Hyperlipidemia, unspecified: Secondary | ICD-10-CM

## 2013-11-14 DIAGNOSIS — E559 Vitamin D deficiency, unspecified: Secondary | ICD-10-CM

## 2013-11-14 DIAGNOSIS — M1990 Unspecified osteoarthritis, unspecified site: Secondary | ICD-10-CM

## 2013-11-14 DIAGNOSIS — K219 Gastro-esophageal reflux disease without esophagitis: Secondary | ICD-10-CM

## 2013-11-14 LAB — HEPATIC FUNCTION PANEL
ALBUMIN: 4.2 g/dL (ref 3.5–5.2)
ALK PHOS: 67 U/L (ref 39–117)
ALT: 27 U/L (ref 0–35)
AST: 25 U/L (ref 0–37)
Bilirubin, Direct: 0.1 mg/dL (ref 0.0–0.3)
TOTAL PROTEIN: 7.4 g/dL (ref 6.0–8.3)
Total Bilirubin: 1 mg/dL (ref 0.3–1.2)

## 2013-11-14 LAB — CBC WITH DIFFERENTIAL/PLATELET
Basophils Absolute: 0 10*3/uL (ref 0.0–0.1)
Basophils Relative: 0.4 % (ref 0.0–3.0)
EOS ABS: 0.1 10*3/uL (ref 0.0–0.7)
Eosinophils Relative: 1.7 % (ref 0.0–5.0)
HCT: 44.7 % (ref 36.0–46.0)
Hemoglobin: 15 g/dL (ref 12.0–15.0)
Lymphocytes Relative: 24 % (ref 12.0–46.0)
Lymphs Abs: 1.4 10*3/uL (ref 0.7–4.0)
MCHC: 33.5 g/dL (ref 30.0–36.0)
MCV: 94.6 fl (ref 78.0–100.0)
MONO ABS: 0.5 10*3/uL (ref 0.1–1.0)
Monocytes Relative: 7.6 % (ref 3.0–12.0)
NEUTROS ABS: 4 10*3/uL (ref 1.4–7.7)
NEUTROS PCT: 66.3 % (ref 43.0–77.0)
Platelets: 214 10*3/uL (ref 150.0–400.0)
RBC: 4.73 Mil/uL (ref 3.87–5.11)
RDW: 12.9 % (ref 11.5–14.6)
WBC: 6 10*3/uL (ref 4.5–10.5)

## 2013-11-14 LAB — LIPID PANEL
CHOLESTEROL: 174 mg/dL (ref 0–200)
HDL: 78.9 mg/dL (ref >39.00)
LDL Cholesterol: 78 mg/dL (ref 0–99)
TRIGLYCERIDES: 87 mg/dL (ref 0.0–149.0)
Total CHOL/HDL Ratio: 2
VLDL: 17.4 mg/dL (ref 0.0–40.0)

## 2013-11-14 LAB — BASIC METABOLIC PANEL
BUN: 16 mg/dL (ref 6–23)
CO2: 27 meq/L (ref 19–32)
Calcium: 9.5 mg/dL (ref 8.4–10.5)
Chloride: 106 mEq/L (ref 96–112)
Creatinine, Ser: 0.7 mg/dL (ref 0.4–1.2)
GFR: 96.46 mL/min (ref >60.00)
Glucose, Bld: 92 mg/dL (ref 70–99)
Potassium: 4.2 mEq/L (ref 3.5–5.1)
SODIUM: 141 meq/L (ref 135–145)

## 2013-11-14 LAB — URINALYSIS
Bilirubin Urine: NEGATIVE
Hgb urine dipstick: NEGATIVE
Ketones, ur: NEGATIVE
Leukocytes, UA: NEGATIVE
NITRITE: NEGATIVE
PH: 5.5 (ref 5.0–8.0)
Specific Gravity, Urine: 1.025 (ref 1.000–1.030)
TOTAL PROTEIN, URINE-UPE24: NEGATIVE
Urine Glucose: NEGATIVE
Urobilinogen, UA: 0.2 (ref 0.0–1.0)

## 2013-11-14 LAB — TSH: TSH: 1.88 u[IU]/mL (ref 0.35–5.50)

## 2013-11-14 MED ORDER — OMEPRAZOLE 20 MG PO CPDR: DELAYED_RELEASE_CAPSULE | ORAL | Status: DC

## 2013-11-14 MED ORDER — SIMVASTATIN 20 MG PO TABS: ORAL_TABLET | ORAL | Status: DC

## 2013-11-14 NOTE — Patient Instructions (Signed)
Today we updated your med list in our EPIC system...    Continue your current medications the same...  We refilled your OMEPRAZOLE and SIMVASTATIN per request...  .toady we did your follow up CXR, EKG & FASTING blood work...    We will contact you w/ the results when available...   Keep up the good work w/ diet & exercise (Congrats on your retirement)...  Call for any questions.Marland KitchenMarland Kitchen

## 2013-11-15 ENCOUNTER — Encounter: Payer: Self-pay | Admitting: Pulmonary Disease

## 2013-11-15 ENCOUNTER — Telehealth: Payer: Self-pay | Admitting: Pulmonary Disease

## 2013-11-15 ENCOUNTER — Telehealth: Payer: Self-pay

## 2013-11-15 LAB — VITAMIN D 25 HYDROXY (VIT D DEFICIENCY, FRACTURES): VIT D 25 HYDROXY: 63 ng/mL (ref 30–89)

## 2013-11-15 MED ORDER — LEVOFLOXACIN 500 MG PO TABS: 500.0000 mg | ORAL_TABLET | Freq: Every day | ORAL | Status: DC

## 2013-11-15 NOTE — Telephone Encounter (Signed)
Called and spoke with pt. Aware of recs. Nothing further needed 

## 2013-11-15 NOTE — Telephone Encounter (Signed)
Message copied by Len Blalock on Thu Nov 15, 2013  4:48 PM ------      Message from: Noralee Space      Created: Thu Nov 15, 2013  3:32 PM       Please notify patient>        FLP is within parameters on the simva20; Rec- continue same + diet 7 exercise...      Chems, CBC, Thyroid, Vit D, Urine are ALL WNL...      Rec to continue her same Armour thyroid & vit supplements... ------

## 2013-11-15 NOTE — Progress Notes (Signed)
Subjective:     Patient ID: [name] [name], female   DOB: 1951/02/25, 63 y.o.   MRN: [id number]  HPI 63 y/o WF here for a follow up visit... she has multiple medical problems as noted below...    ~  June 17, 2010:  she had FLP done here 6/11 on the Simva20 & all parameters were WNL.Marland Kitchen. she called 7/11 c/o feeling light headed w/ HA on top of head, & BP at Pharm was "155/88"... we decided to call in Metoprolol ER 107m- start 1/2 daily & she reports excellent response w/ BP ret to normal & feeling well... now she would like to try & come off this med> OK to wean slowly off & monitor BP at home... she has CPX planned for 12/11.  ~  September 15, 2010:  she has weaned off the Metoprolol & BP today is 128/80 on diet  alone & she is encouraged... feeling well overall but c/o 3 episodes of "hives" really flushing episodes most likely due to her Niacin Rx> asked to stop this therapy & call if symptoms recur... otherw feeling well w/o new complaints or concerns... she is due for CXR (clear, WNL), EKG (NSR, WNL), & Fasting labs today (all look good)...   ~  October 15, 2011:  Yearly ROV & CPX>  She has had a good yr just c/o some left knee pain w/ hx of dislocation yrs ago she says; hx reveals 2 prev arthroscopies by DrWainer & she is rec to f/u w/ him...  She still takes her bioequivolent hormone rx from [name] [name].. She has borderline BP, on diet rx, but doesn't want meds> BP today 140/86 & she is convinced it is due to her diet over the holidays & she is getting back t her usual routine & will f/u BP at home & let me know;  Her only prescription med is SCarlylelooks good w/ all parameters at the goal (see below);  Also takes Armour Thyroid 123m& her TSH looks good on this as well...  Her GYN in [place] treated her for a bacterial infection she says...  See prob list below>>  ~  November 14, 2012:  Yearly ROV & CPX> [name] [name] reports that DrWainer did right shoulder surg for  bone spurs & rotator cuff tear 11/13- at outpt center & [place] & much improved; now notes left knee prob w/ eval 2013 by DrWainer showing bone spurs, bone on bone, etc- on Mobic prn;  Her CC today is onset of a mild intermittent tremor, mother had it quite severely in her old age & we discussed Neuro eval of this likely familial tremor...    [name] noted some vag spotting & has seen her GYN- Dr. RhTeryl [name] [place] w/ several tests pending at this time...    We reviewed prob list, meds, xrays and labs> see below for updates >>   LABS 2/14:  FLP- at goals on Simva20;  Chems- wnl;  CBC- wnl;  TSH=0.92...   ~  November 14, 2013:  Yearly ROV & CPX>  Chol is managed w/ diet & Simva20; FLP 2/15 showed TChol 174, TG 87, HDL 79, LDL 78 She had abn mammogram & her Gyn stopped Estrogen w/ f/u mammography planned...  She continues to see [name] homeopathic bioequiv therapy- nattokinase, DHEA, Prometrium She remains on Armour Thyroid 120 at her request-  She had colonoscopy by  DrPatterson w/ adenoma removed & f/u planned in 5 yrs... She has DJD in knees w/ injections periodically from her Ortho;  s/p right rotator cuff surg followed by therapy & improved; she has also seen Podiatry for tendonitis & orthotics have helped... She tells me she saw Neuro due to Tremor (mother had similar tremor) but not bad enough for meds & she will call if worse... On Oracea40 daily per Derm... She has been taking B12 shots and wants me to fill this Rx so she can give her own injections... We reviewed prob list, meds, xrays and labs> see below for updates >>   CXR 2/15 showed norm heart size, clear lungs, NAD.Marland KitchenMarland Kitchen   EKG 2/15 showed NSR, rate71, WNL, NAD...   LABS 2/15:  FLP- at goals on Simva20;  Chems- wnl;  CBC- wnl;  Thyroid=1.88;  VitD=63...           Problem List:   ALLERGY (ICD-995.3) - hx allergies w/ OTC Rx Prn...  Hx of BRONCHITIS (ICD-490) - denies recent cough,  phlegm, SOB, etc... ~  CXR 12/11 showed clear lungs, WNL...  HYPERTENSION, BORDERLINE (ICD-401.9) - BP ~140/90 in past & she follows a low sodium, weight reducting diet strategy & wants to stay off medication if poss... started Metop60m in 2011 but was able to wean off & BP has remained stable on diet alone. ~  12/10:  BP= 128/80 & similar at home, keep up the good work. ~  7/11: pt called w/ HA, light headed, & BP= 155/88 at Pharm;  Metoprolol ER 530m 1/2 daily started. ~  9/11:  OV doing well on the 2511metoprolol, BP= 110/76 & she wants to wean off if able- OK. ~  12/11: here for CPX, she weaned off the Metoprolol & BP= 128/80 doing well; EKG shows NSR, NAD. ~  1/13:  BP= 140/86 & she thinks it's due to diet over the holidays & doesn't want meds; she will adjust diet & monitor BP. ~  2/14:  BP= 112/70 today & she is very pleased; on diet alone & denies CP, palpit, SOB, edema, etc... ~  2/15:  BP= 134/88 & she remains asymptomatic...  HYPERCHOLESTEROLEMIA (ICD-272.4) - prev on diet alone, SIMVASTATIN 93m88marted 12/10 w/ good response... ~  FLP [place]6 showed TChol 221, TG 61, HDL 58, LDL 149... rec> diet/ exerc/ get wt down. ~  12/10: we decided to start Simva20 & ret for FLP. ~  FLP 6/11 on Simva20 showed TChol 158, TG 93, HDL 63, LDL 76... continue same. ~  FLP [place]11 on Simva20 showed TChol 167, TG 82, HDL 58, LDL 93 ~  FLP 1/13 on Simva20 showed TChol 156, TG 37, HDL 64, LDL 85 ~  FLP 2/14 on Simva20 showed TChol 153, TG 59, HDL 63, LDL 78 ~  FLP 2/15 on Simva20 showed TChol 174, TG 87, HDL 79, LDL 78   THYROID MEDICATION >> she is on Armour Thyroid from [facility] [employer] [employer] do not have their lab data to know if she is being treated for hypothyroidism found on lab testing or for some other diagnosis... ~  1/13:  Records show that she is receiving Armour Thyroid 193mg28m. ~  2/14:  [name] [name] that she was changed to Armour Thyroid 90mg/92mis yr => TSH=  0.92, continue same  ~  2/15:  [name] [name] that she is taking Armour Thyroid 120;  Labs today showed TSH= 1.88  GERD (ICD-530.81) - prev on Nexium  63m/d & switched to generic OMEPRAZOLE 274md as needed...   IBS (ICD-564.1) - hx mild IBS symptoms in the past... ~  Colonoscopy 2003 was neg by DrPatterson... ~ 1/13: f/u colonoscopy will be due this yr& she is waiting to hear from DrPatterson... ~  2/14: she has not yet sched her f/u colonoscopy due to recent shoulder surg etc; she promises to sched this as soon as practical...  GYN= DrWhite in [place] w/ PAP, Mammography, BMD's all done by them>> we do not have data... ~ she is on Progesterone 10061mrally & 3 creams from [facility] ~  2/14: she reports vaginal spotting recently & DrWhite is planning several tests for further evaluation...  DEGENERATIVE JOINT DISEASE (ICD-715.90) - hx prev knee arthroscopy x2 by DrWainer...  ~  1/13:  She is c/o persistant left knee pain & will contact DrWainer for f/u exam...  ~  11/13: she had right shoulder surg by DrWainer for spurs, & rotator cuff tear...  VITAMIN D DEFICIENCY (ICD-268.9) - hx low Vit D level at [facility] center (level= 20 by pt hx) and she states started on Vit B12 shots and Vit D supplements (we do not have records & I discussed this with her)... ~  labs 12/11 showed Vit D level = 51... continue Vit D supplement. ~  Labs 1/13 showed Vit D level = 72 & ok to decr her suplements if she wants to (she has continued the 5000u daily supplement)...  TREMOR >> she noted a mild intermittent right arm tremor 2/14 & we discussed referral to Neuro in light of her pos Fam Hx of tremor (in mother- quite severe)...  ANXIETY (ICD-300.00) - she manages well & has not required anxiolytic Rx.  SKIN CANCER, HX OF (ICD-V10.83) - followed by [place] [facility], DrUhlin- hx Acne, Basal cell ca, & Melanoma on left shoulder 2005... she maintains regular f/u w/ Derm for surveillance... ~  2011:   she was started on Oracea for acne...   "WELEnolaVAL & RX >>> she tells me that she saw Dr. LawTeena Dunk the past, now [facility] for heavy metal toxicity w/ chelation therapy... ? hypothyroidism- on Armour Thyroid & TSH= ok... now also on "hormone creams= bio-identical hormone replacement"... ~  1/13: She takes Armour Thyroid 120m50mher TSH looks good on this as well... ~  1/14: VickJocelyn Lamerls me that she is now [facility]  2/15:  [name] [name] that she is taking Armour Thyroid 120 & a number of bio-equiv meds from [facility] pt gets the yearly seasonal flu vaccines;  She had TDAP in 2013 due to new grandchild;  Rx written for shingles vaccine (& she will check w/ insurance co);  She will be a candidate for Pneumovax at 66...68 Past Surgical History  Procedure Laterality Date  . Knee arthroscopy  1987&2003    left  . Skin surgery  4/05    Melanoma surg left shoulder  . Hernia repair  1957  . Tubal ligation  1990  . Bunionectomy  2004    left  . Foot osteotomy  2003    right  . Shoulder arthroscopy with open rotator cuff repair  2013    right    Outpatient Encounter Prescriptions as of 11/14/2013  Medication Sig  . aspirin 325 MG tablet Take 325 mg by mouth daily.  . Cholecalciferol (VITAMIN D3)  5000 UNITS CAPS Take 1 capsule by mouth daily.  . Coenzyme Q10 (COQ10) 200 MG CAPS Take 1 tablet by mouth 2 (two) times daily.  . cyanocobalamin (,VITAMIN B-12,) 1000 MCG/ML injection Inject into the muscle.  Marland Kitchen DHEA 10 MG TABS Take 5 mg by mouth daily.  Marland Kitchen doxycycline (ORACEA) 40 MG capsule Take 40 mg by mouth every morning.  Marland Kitchen HYDROcodone-acetaminophen (NORCO/VICODIN) 5-325 MG per tablet Take 1 tablet by mouth as needed.  . Magnesium (MAGNESIUM GLYCINATE PLUS) 110 MG CAPS Take 200 mg by mouth daily.   . meloxicam (MOBIC) 15 MG tablet Take 1 tablet by mouth daily.  .  METHYLCOBALAMIN SL Place 5,000 Units under the tongue daily.  . metroNIDAZOLE (METROGEL) 0.75 % gel Apply topically 2 (two) times daily.  . Multiple Vitamins-Minerals (MULTIVITAMIN PO) Take 1 tablet by mouth daily.  . Nattokinase 100 MG CAPS Take 1 capsule by mouth daily.  . NONFORMULARY OR COMPOUNDED ITEM Apply topically daily.  Marland Kitchen omeprazole (PRILOSEC) 20 MG capsule TAKE 1 CAPSULE DAILY  . progesterone (PROMETRIUM) 100 MG capsule Take 100 mg by mouth daily.  . simvastatin (ZOCOR) 20 MG tablet TAKE 1 TABLET AT BEDTIME  . Testosterone Propionate (FIRST-TESTOSTERONE MC) 2 % CREA Place 1 mL onto the skin every morning.  . thyroid (ARMOUR THYROID) 120 MG tablet Take 120 mg by mouth daily.  . [DISCONTINUED] omeprazole (PRILOSEC) 20 MG capsule TAKE 1 CAPSULE DAILY  . [DISCONTINUED] simvastatin (ZOCOR) 20 MG tablet TAKE 1 TABLET AT BEDTIME    Allergies  Allergen Reactions  . Augmentin [Amoxicillin-Pot Clavulanate] Diarrhea  . Clindamycin/Lincomycin Diarrhea  . Flagyl [Metronidazole Hcl] Diarrhea    Stomach upset  . Telithromycin Diarrhea    Current Medications, Allergies, Past Medical History, Past Surgical History, Family History, and Social History were reviewed in [employer] record.    Review of Systems         The patient complains of joint pain, stiffness, and arthritis.  The patient denies fever, chills, sweats, anorexia, fatigue, weakness, malaise, weight loss, sleep disorder, blurring, diplopia, eye irritation, eye discharge, vision loss, eye pain, photophobia, earache, ear discharge, tinnitus, decreased hearing, nasal congestion, nosebleeds, sore throat, hoarseness, chest pain, palpitations, syncope, dyspnea on exertion, orthopnea, PND, peripheral edema, cough, dyspnea at rest, excessive sputum, hemoptysis, wheezing, pleurisy, nausea, vomiting, diarrhea, constipation, change in bowel habits, abdominal pain, melena, hematochezia, jaundice, gas/bloating,  indigestion/heartburn, dysphagia, odynophagia, dysuria, hematuria, urinary frequency, urinary hesitancy, nocturia, incontinence, back pain, joint swelling, muscle cramps, muscle weakness, sciatica, restless legs, leg pain at night, leg pain with exertion, rash, itching, dryness, suspicious lesions, paralysis, paresthesias, seizures, tremors, vertigo, transient blindness, frequent falls, frequent headaches, difficulty walking, depression, anxiety, memory loss, confusion, cold intolerance, heat intolerance, polydipsia, polyphagia, polyuria, unusual weight change, abnormal bruising, bleeding, enlarged lymph nodes, urticaria, allergic rash, hay fever, and recurrent infections.     Objective:   Physical Exam     WD, WN, 63 y/o WF in NAD... GENERAL:  Alert & oriented; pleasant & cooperative... HEENT:  [place]/AT, EOM-wnl, PERRLA, Fundi-benign, EACs-clear, TMs-wnl, NOSE-clear, THROAT-clear & wnl. NECK:  Supple w/ full ROM; no JVD; normal carotid impulses w/o bruits; no thyromegaly or nodules palpated; no lymphadenopathy. CHEST:  Clear to P & A; without wheezes/ rales/ or rhonchi. HEART:  Regular Rhythm; without murmurs/ rubs/ or gallops. ABDOMEN:  Soft & nontender; normal bowel sounds; no organomegaly or masses detected. EXT: without deformities, +mild arthritis left knee; no varicose veins/ venous insuffic/ or edema. Sl decr ROM in  right shoulder but steadily improving w/ PT she says... NEURO:  CN's intact; motor testing normal; sensory testing normal; gait normal & balance OK. DERM:  No lesions noted; no rash etc... scar left shoulder from prev melanoma surg 2005.  RADIOLOGY DATA:  Reviewed in the EPIC EMR & discussed w/ the patient...  LABORATORY DATA:  Reviewed in the EPIC EMR & discussed w/ the patient...   Assessment:     Hx AR & Bronchitis>  She has not had any allergy symptoms or bronchitic infections is quite awhile...  HBP>  Controlled on diet therapy alone which is how she likes  it...  CHOL>  Stable on diet + Simva20; continue same...  GI> GERD, IBS>  Stable w/o GI complaints; she is due for f/u colon & she will call to set this up...  DJD>  She has persist left knee complaints & recent right shoulder surg by DrWainer...  Vit D Defic>  Vit D level was up to 72- BWG66;  She decided to continue same dose...  TREMOR>  There is a Hx of tremor in her Mom, making this a familial tremor; we will refer to Neuro...  Anxiety>  Stable, she denies issues & not requiring any meds...     Plan:     Patient's Medications  New Prescriptions   LEVOFLOXACIN (LEVAQUIN) 500 MG TABLET    Take 1 tablet (500 mg total) by mouth daily.  Previous Medications   ASPIRIN 325 MG TABLET    Take 325 mg by mouth daily.   CHOLECALCIFEROL (VITAMIN D3) 5000 UNITS CAPS    Take 1 capsule by mouth daily.   COENZYME Q10 (COQ10) 200 MG CAPS    Take 1 tablet by mouth 2 (two) times daily.   CYANOCOBALAMIN (,VITAMIN B-12,) 1000 MCG/ML INJECTION    Inject into the muscle.   DHEA 10 MG TABS    Take 5 mg by mouth daily.   DOXYCYCLINE (ORACEA) 40 MG CAPSULE    Take 40 mg by mouth every morning.   HYDROCODONE-ACETAMINOPHEN (NORCO/VICODIN) 5-325 MG PER TABLET    Take 1 tablet by mouth as needed.   MAGNESIUM (MAGNESIUM GLYCINATE PLUS) 110 MG CAPS    Take 200 mg by mouth daily.    MELOXICAM (MOBIC) 15 MG TABLET    Take 1 tablet by mouth daily.   METHYLCOBALAMIN SL    Place 5,000 Units under the tongue daily.   METRONIDAZOLE (METROGEL) 0.75 % GEL    Apply topically 2 (two) times daily.   MULTIPLE VITAMINS-MINERALS (MULTIVITAMIN PO)    Take 1 tablet by mouth daily.   NATTOKINASE 100 MG CAPS    Take 1 capsule by mouth daily.   NONFORMULARY OR COMPOUNDED ITEM    Apply topically daily.   PROGESTERONE (PROMETRIUM) 100 MG CAPSULE    Take 100 mg by mouth daily.   TESTOSTERONE PROPIONATE (FIRST-TESTOSTERONE MC) 2 % CREA    Place 1 mL onto the skin every morning.   THYROID (ARMOUR THYROID) 120 MG TABLET    Take 120  mg by mouth daily.  Modified Medications   Modified Medication Previous Medication   OMEPRAZOLE (PRILOSEC) 20 MG CAPSULE omeprazole (PRILOSEC) 20 MG capsule      TAKE 1 CAPSULE DAILY    TAKE 1 CAPSULE DAILY   SIMVASTATIN (ZOCOR) 20 MG TABLET simvastatin (ZOCOR) 20 MG tablet      TAKE 1 TABLET AT BEDTIME    TAKE 1 TABLET AT BEDTIME  Discontinued Medications   No medications on  file

## 2013-11-15 NOTE — Telephone Encounter (Signed)
Pt is also aware of cxr results

## 2013-11-15 NOTE — Telephone Encounter (Signed)
Called and spoke with pt and she stated that she is having problems that started this morning with ear pressure, sinus pressure and cough with green sputum this morning.  Pt is requesting something be called to the pharmacy for her.  SN please advise. Thanks  Allergies  Allergen Reactions  . Augmentin [Amoxicillin-Pot Clavulanate] Diarrhea  . Clindamycin/Lincomycin Diarrhea  . Flagyl [Metronidazole Hcl] Diarrhea    Stomach upset  . Telithromycin Diarrhea     Current Outpatient Prescriptions on File Prior to Visit  Medication Sig Dispense Refill  . aspirin 325 MG tablet Take 325 mg by mouth daily.      . Cholecalciferol (VITAMIN D3) 5000 UNITS CAPS Take 1 capsule by mouth daily.      . Coenzyme Q10 (COQ10) 200 MG CAPS Take 1 tablet by mouth 2 (two) times daily.      . cyanocobalamin (,VITAMIN B-12,) 1000 MCG/ML injection Inject into the muscle.      Marland Kitchen DHEA 10 MG TABS Take 5 mg by mouth daily.      Marland Kitchen doxycycline (ORACEA) 40 MG capsule Take 40 mg by mouth every morning.      Marland Kitchen HYDROcodone-acetaminophen (NORCO/VICODIN) 5-325 MG per tablet Take 1 tablet by mouth as needed.      . Magnesium (MAGNESIUM GLYCINATE PLUS) 110 MG CAPS Take 200 mg by mouth daily.       . meloxicam (MOBIC) 15 MG tablet Take 1 tablet by mouth daily.      . METHYLCOBALAMIN SL Place 5,000 Units under the tongue daily.      . metroNIDAZOLE (METROGEL) 0.75 % gel Apply topically 2 (two) times daily.      . Multiple Vitamins-Minerals (MULTIVITAMIN PO) Take 1 tablet by mouth daily.      . Nattokinase 100 MG CAPS Take 1 capsule by mouth daily.      . NONFORMULARY OR COMPOUNDED ITEM Apply topically daily.      Marland Kitchen omeprazole (PRILOSEC) 20 MG capsule TAKE 1 CAPSULE DAILY  90 capsule  3  . progesterone (PROMETRIUM) 100 MG capsule Take 100 mg by mouth daily.      . simvastatin (ZOCOR) 20 MG tablet TAKE 1 TABLET AT BEDTIME  90 tablet  3  . Testosterone Propionate (FIRST-TESTOSTERONE MC) 2 % CREA Place 1 mL onto the skin every  morning.      . thyroid (ARMOUR THYROID) 120 MG tablet Take 120 mg by mouth daily.       No current facility-administered medications on file prior to visit.

## 2013-11-15 NOTE — Telephone Encounter (Signed)
Pt is aware of results and recs.  Pt is interested in signing up for MyChart since her results cannot be emailed to her.  I advised her that I was in a satellite location but that tomorrow I would get the appropriate info to her to initiate her MyChart.

## 2013-11-15 NOTE — Telephone Encounter (Signed)
Per SN---  levaquin 500 mg  #7  1 daily otc align once daily mucinex 600 mg  2 po bid  fluids

## 2013-11-15 NOTE — Telephone Encounter (Signed)
Message copied by Len Blalock on Thu Nov 15, 2013  4:50 PM ------      Message from: Noralee Space      Created: Thu Nov 15, 2013  3:30 PM       Please notify patient>        CXR is clear/ NAD, heart size is wnl... ------

## 2013-11-16 NOTE — Telephone Encounter (Signed)
[name] [name] has showed me how to initiate a MyChart account for a patient.  This initiation paperwork is being sent to pt today.  Nothing further needed.

## 2014-08-04 LAB — HM MAMMOGRAPHY

## 2014-08-08 ENCOUNTER — Other Ambulatory Visit: Payer: Self-pay | Admitting: Pulmonary Disease

## 2014-08-08 MED ORDER — OMEPRAZOLE 20 MG PO CPDR: DELAYED_RELEASE_CAPSULE | ORAL | Status: DC

## 2014-08-26 ENCOUNTER — Telehealth: Payer: Self-pay | Admitting: *Deleted

## 2014-08-26 NOTE — Telephone Encounter (Signed)
Ok for her to establish- she is a family member and we'd be happy to see her!

## 2014-08-26 NOTE — Telephone Encounter (Signed)
Appointment scheduled.

## 2014-08-26 NOTE — Telephone Encounter (Signed)
Caller name: Ryleigh  Relation to pt:  self Call back number: [phone] or [phone] Pharmacy: [facility], [place]  Reason for call: "My husband and I have been patients of Dr. Jeannine Kitten for many years.  Because of his retirement, my husband, Krisha Beegle, transferred to Dr. Birdie Riddle earlier this year.  I would also like to transfer to Dr. Virgil Benedict care.  I usually have a yearly physical at the beginning of the calendar year and would like to establish a relationship as a new patient as soon as possible.  I am not yet eligible for Medicare."  I told Mrs. Sabra Heck I would send the request to you and then our office would be in touch with her.

## 2014-10-23 ENCOUNTER — Other Ambulatory Visit: Payer: Self-pay | Admitting: Pulmonary Disease

## 2014-11-22 ENCOUNTER — Ambulatory Visit (INDEPENDENT_AMBULATORY_CARE_PROVIDER_SITE_OTHER): Payer: 59 | Admitting: Family Medicine

## 2014-11-22 ENCOUNTER — Encounter: Payer: Self-pay | Admitting: Family Medicine

## 2014-11-22 VITALS — BP 142/80 | HR 82 | Temp 98.2°F | Resp 16 | Ht 65.5 in | Wt 179.2 lb

## 2014-11-22 DIAGNOSIS — E559 Vitamin D deficiency, unspecified: Secondary | ICD-10-CM

## 2014-11-22 DIAGNOSIS — K219 Gastro-esophageal reflux disease without esophagitis: Secondary | ICD-10-CM

## 2014-11-22 DIAGNOSIS — M25562 Pain in left knee: Secondary | ICD-10-CM

## 2014-11-22 DIAGNOSIS — E785 Hyperlipidemia, unspecified: Secondary | ICD-10-CM

## 2014-11-22 DIAGNOSIS — E039 Hypothyroidism, unspecified: Secondary | ICD-10-CM | POA: Insufficient documentation

## 2014-11-22 DIAGNOSIS — E038 Other specified hypothyroidism: Secondary | ICD-10-CM

## 2014-11-22 DIAGNOSIS — Z85828 Personal history of other malignant neoplasm of skin: Secondary | ICD-10-CM

## 2014-11-22 DIAGNOSIS — Z780 Asymptomatic menopausal state: Secondary | ICD-10-CM

## 2014-11-22 LAB — HEPATIC FUNCTION PANEL
ALK PHOS: 74 U/L (ref 39–117)
ALT: 21 U/L (ref 0–35)
AST: 21 U/L (ref 0–37)
Albumin: 4.3 g/dL (ref 3.5–5.2)
BILIRUBIN DIRECT: 0.1 mg/dL (ref 0.0–0.3)
BILIRUBIN INDIRECT: 0.5 mg/dL (ref 0.2–1.2)
TOTAL PROTEIN: 6.7 g/dL (ref 6.0–8.3)
Total Bilirubin: 0.6 mg/dL (ref 0.2–1.2)

## 2014-11-22 LAB — LIPID PANEL
CHOL/HDL RATIO: 2.2 ratio
Cholesterol: 180 mg/dL (ref 0–200)
HDL: 81 mg/dL (ref >39)
LDL Cholesterol: 81 mg/dL (ref 0–99)
Triglycerides: 92 mg/dL (ref <150)
VLDL: 18 mg/dL (ref 0–40)

## 2014-11-22 LAB — BASIC METABOLIC PANEL
BUN: 15 mg/dL (ref 6–23)
CALCIUM: 9.2 mg/dL (ref 8.4–10.5)
CO2: 24 mEq/L (ref 19–32)
Chloride: 104 mEq/L (ref 96–112)
Creat: 0.65 mg/dL (ref 0.50–1.10)
GLUCOSE: 99 mg/dL (ref 70–99)
Potassium: 3.8 mEq/L (ref 3.5–5.3)
Sodium: 139 mEq/L (ref 135–145)

## 2014-11-22 NOTE — Progress Notes (Signed)
Pre visit review using our clinic review tool, if applicable. No additional management support is needed unless otherwise documented below in the visit note. 

## 2014-11-22 NOTE — Progress Notes (Signed)
   Subjective:    Patient ID: Merlene Morse, female    DOB: Oct 11, 1950, 64 y.o.   MRN: [id number]  HPI New to establish.  Previous MD- Leeanne Deed.  UTD on GYN ([facility]), Dr Susie Cassette (Derm)  Hyperlipidemia- chronic problem, on Simvastatin.  No abd, N/V, myalgias, no CP, SOB, edema.  Hypothyroid- chronic problem.  On Armour thyroid 120.  Denies excessive fatigue, no changes to skin/hair/nails.  No changes to bowel or bladder habits.  GERD- chronic problem, on Omeprazole.  Pt has concerns about possibility of osteoporosis w/ prolonged use of PPI.  Pt will take for 4-6 weeks at a time and then skip 1 week.  sxs tend to depend on diet   Review of Systems For ROS see HPI     Objective:   Physical Exam  Constitutional: She is oriented to person, place, and time. She appears well-developed and well-nourished. No distress.  HENT:  Head: Normocephalic and atraumatic.  Eyes: Conjunctivae and EOM are normal. Pupils are equal, round, and reactive to light.  Neck: Normal range of motion. Neck supple. No thyromegaly present.  Cardiovascular: Normal rate, regular rhythm, normal heart sounds and intact distal pulses.   No murmur heard. Pulmonary/Chest: Effort normal and breath sounds normal. No respiratory distress.  Abdominal: Soft. She exhibits no distension. There is no tenderness.  Musculoskeletal: She exhibits no edema.  Lymphadenopathy:    She has no cervical adenopathy.  Neurological: She is alert and oriented to person, place, and time.  Skin: Skin is warm and dry.  Psychiatric: She has a normal mood and affect. Her behavior is normal.  Vitals reviewed.         Assessment & Plan:

## 2014-11-22 NOTE — Patient Instructions (Signed)
Schedule your complete physical in 6 months We'll notify you of your lab results and make any changes if needed Keep up the good work!  You look great! We'll call you with your bone density appt Call with any questions or concerns Welcome!  We're glad to have you! HAPPY EARLY BIRTHDAY!!!!

## 2014-11-23 LAB — TSH: TSH: 0.895 u[IU]/mL (ref 0.350–4.500)

## 2014-11-23 LAB — VITAMIN D 25 HYDROXY (VIT D DEFICIENCY, FRACTURES): Vit D, 25-Hydroxy: 47 ng/mL (ref 30–100)

## 2014-11-25 ENCOUNTER — Encounter: Payer: Self-pay | Admitting: Family Medicine

## 2014-11-25 ENCOUNTER — Other Ambulatory Visit: Payer: Self-pay | Admitting: General Practice

## 2014-11-25 MED ORDER — OMEPRAZOLE 20 MG PO CPDR: DELAYED_RELEASE_CAPSULE | ORAL | Status: DC

## 2014-11-25 MED ORDER — THYROID 120 MG PO TABS: 120.0000 mg | ORAL_TABLET | Freq: Every day | ORAL | Status: DC

## 2014-11-25 MED ORDER — SIMVASTATIN 20 MG PO TABS: 20.0000 mg | ORAL_TABLET | Freq: Every day | ORAL | Status: DC

## 2014-11-26 NOTE — Assessment & Plan Note (Signed)
New to provider, ongoing for pt.  Tolerating statin w/o difficulty.  Check labs.  Adjust meds prn  

## 2014-11-26 NOTE — Assessment & Plan Note (Signed)
New to provider.  Refer to derm.

## 2014-11-26 NOTE — Assessment & Plan Note (Signed)
New to provider, ongoing for pt.  Check labs.  Replete prn.

## 2014-11-26 NOTE — Assessment & Plan Note (Signed)
New to provider.  Refer to ortho for complete evaluation and tx.

## 2014-11-26 NOTE — Assessment & Plan Note (Signed)
New to provider, ongoing for pt.  Currently asymptomatic.  Due for repeat labs.  Adjust meds prn.

## 2014-11-26 NOTE — Assessment & Plan Note (Addendum)
New to provider, ongoing for pt.  Discussed possible risk of osteoporosis in association w/ use of PPIs and possibility of alternative tx w/ H2 blocker.  Will get DEXA as pt has not had one recently.  Pt expressed understanding and is in agreement w/ plan.

## 2014-11-27 ENCOUNTER — Ambulatory Visit (INDEPENDENT_AMBULATORY_CARE_PROVIDER_SITE_OTHER)
Admission: RE | Admit: 2014-11-27 | Discharge: 2014-11-27 | Disposition: A | Discharge status: Home / Self Care | Payer: 59 | Source: Ambulatory Visit | Attending: Family Medicine | Admitting: Family Medicine

## 2014-11-27 ENCOUNTER — Encounter: Payer: Self-pay | Admitting: Family Medicine

## 2014-11-27 DIAGNOSIS — Z780 Asymptomatic menopausal state: Secondary | ICD-10-CM

## 2014-12-09 ENCOUNTER — Encounter: Payer: Self-pay | Admitting: Family Medicine

## 2014-12-09 DIAGNOSIS — M25562 Pain in left knee: Secondary | ICD-10-CM

## 2014-12-09 NOTE — Telephone Encounter (Signed)
Referral placed.

## 2014-12-23 ENCOUNTER — Encounter: Payer: Self-pay | Admitting: Family Medicine

## 2014-12-25 ENCOUNTER — Encounter: Payer: Self-pay | Admitting: Physician Assistant

## 2014-12-25 ENCOUNTER — Ambulatory Visit (INDEPENDENT_AMBULATORY_CARE_PROVIDER_SITE_OTHER): Payer: 59 | Admitting: Physician Assistant

## 2014-12-25 VITALS — BP 120/78 | HR 82 | Temp 98.6°F | Resp 16 | Ht 65.5 in | Wt 181.0 lb

## 2014-12-25 DIAGNOSIS — J208 Acute bronchitis due to other specified organisms: Principal | ICD-10-CM

## 2014-12-25 DIAGNOSIS — J00 Acute nasopharyngitis [common cold]: Secondary | ICD-10-CM

## 2014-12-25 DIAGNOSIS — B9689 Other specified bacterial agents as the cause of diseases classified elsewhere: Secondary | ICD-10-CM

## 2014-12-25 MED ORDER — AZITHROMYCIN 250 MG PO TABS: ORAL_TABLET | ORAL | Status: DC

## 2014-12-25 MED ORDER — HYDROCOD POLST-CHLORPHEN POLST 10-8 MG/5ML PO LQCR: 5.0000 mL | Freq: Two times a day (BID) | ORAL | Status: DC | PRN

## 2014-12-25 NOTE — Progress Notes (Signed)
Pre visit review using our clinic review tool, if applicable. No additional management support is needed unless otherwise documented below in the visit note/SLS  

## 2014-12-25 NOTE — Progress Notes (Signed)
Patient presents to clinic today c/o 6 days of progressively worsening productive cough, chest congestion, postnasal drip and fatigue. Endorses some mild sinus and ear pressure that has since resolved. Denies chest pain.   Past Medical History  Diagnosis Date  . Allergy, unspecified not elsewhere classified   . Bronchitis   . Borderline hypertension   . Hypercholesterolemia   . GERD (gastroesophageal reflux disease)   . IBS (irritable bowel syndrome)   . DJD (degenerative joint disease)   . Vitamin D deficiency   . History of skin cancer     Current Outpatient Prescriptions on File Prior to Visit  Medication Sig Dispense Refill  . aspirin 325 MG tablet Take 325 mg by mouth daily.    . Cholecalciferol (VITAMIN D3) 5000 UNITS CAPS Take 1 capsule by mouth daily.    . Coenzyme Q10 (COQ10) 200 MG CAPS Take 1 tablet by mouth 2 (two) times daily.    . cyanocobalamin (,VITAMIN B-12,) 1000 MCG/ML injection Inject into the muscle.    Marland Kitchen DHEA 10 MG TABS Take 5 mg by mouth daily.    . DORYX 50 MG TBEC Take 1 tablet by mouth daily.   11  . Magnesium (MAGNESIUM GLYCINATE PLUS) 110 MG CAPS Take 200 mg by mouth daily.     . meloxicam (MOBIC) 15 MG tablet Take 1 tablet by mouth daily as needed.     . metroNIDAZOLE (METROGEL) 0.75 % gel Apply topically 2 (two) times daily.    . Multiple Vitamins-Minerals (MULTIVITAMIN PO) Take 1 tablet by mouth daily.    . Nattokinase 100 MG CAPS Take 1 capsule by mouth daily.    . NONFORMULARY OR COMPOUNDED ITEM Apply topically daily.    Marland Kitchen omeprazole (PRILOSEC) 20 MG capsule TAKE 1 CAPSULE DAILY 90 capsule 1  . simvastatin (ZOCOR) 20 MG tablet Take 1 tablet (20 mg total) by mouth at bedtime. 90 tablet 1  . thyroid (ARMOUR THYROID) 120 MG tablet Take 1 tablet (120 mg total) by mouth daily. 90 tablet 1   No current facility-administered medications on file prior to visit.    Allergies  Allergen Reactions  . Augmentin [Amoxicillin-Pot Clavulanate] Diarrhea  .  Clindamycin/Lincomycin Diarrhea  . Flagyl [Metronidazole Hcl] Diarrhea    Stomach upset. Pt states she CANNOT TAKE oral, has no problem with topical.   . Telithromycin Diarrhea    Family History  Problem Relation Age of Onset  . Colon polyps Father   . Colon cancer Neg Hx   . Stomach cancer Neg Hx   . Diabetes Son     History   Social History  . Marital Status: Married    Spouse Name: N/A  . Number of Children: N/A  . Years of Education: N/A   Social History Main Topics  . Smoking status: Former Smoker -- 6 years    Quit date: 10/05/1975  . Smokeless tobacco: Never Used  . Alcohol Use: 6.0 oz/week    12 drink(s) per week     Comment: socially  . Drug Use: No  . Sexual Activity: Not on file   Other Topics Concern  . None   Social History Narrative    Review of Systems - See HPI.  All other ROS are negative.  BP 120/78 mmHg  Pulse 82  Temp(Src) 98.6 F (37 C) (Oral)  Resp 16  Ht 5' 5.5" (1.664 m)  Wt 181 lb (82.101 kg)  BMI 29.65 kg/m2  SpO2 100%  Physical Exam  Constitutional: She  is oriented to person, place, and time and well-developed, well-nourished, and in no distress.  HENT:  Head: Normocephalic and atraumatic.  Right Ear: External ear normal.  Left Ear: External ear normal.  Nose: Nose normal.  Mouth/Throat: Oropharynx is clear and moist. No oropharyngeal exudate.  Tympanic membranes within normal limits bilaterally.  Eyes: Conjunctivae are normal. Pupils are equal, round, and reactive to light.  Neck: Neck supple. No thyromegaly present.  Cardiovascular: Normal rate, regular rhythm, normal heart sounds and intact distal pulses.   Pulmonary/Chest: Effort normal and breath sounds normal. No respiratory distress. She has no wheezes. She has no rales. She exhibits no tenderness.  Lymphadenopathy:    She has no cervical adenopathy.  Neurological: She is alert and oriented to person, place, and time.  Skin: Skin is warm and dry. No rash noted.    Psychiatric: Affect normal.  Vitals reviewed.   Recent Results (from the past 2160 hour(s))  Lipid panel     Status: None   Collection Time: 11/22/14  4:30 PM  Result Value Ref Range   Cholesterol 180 0 - 200 mg/dL    Comment: ATP III Classification:       < 200        mg/dL        Desirable      200 - 239     mg/dL        Borderline High      >= 240        mg/dL        High      Triglycerides 92 <150 mg/dL   HDL 81 >39 mg/dL   Total CHOL/HDL Ratio 2.2 Ratio   VLDL 18 0 - 40 mg/dL   LDL Cholesterol 81 0 - 99 mg/dL    Comment:   Total Cholesterol/HDL Ratio:CHD Risk                        Coronary Heart Disease Risk Table                                        Men       Women          1/2 Average Risk              3.4        3.3              Average Risk              5.0        4.4           2X Average Risk              9.6        7.1           3X Average Risk             23.4       11.0 Use the calculated Patient Ratio above and the CHD Risk table  to determine the patient's CHD Risk. ATP III Classification (LDL):       < 100        mg/dL         Optimal      100 - 129     mg/dL         Near or  Above Optimal      130 - 159     mg/dL         Borderline High      160 - 189     mg/dL         High       > 190        mg/dL         Very High     Basic metabolic panel     Status: None   Collection Time: 11/22/14  4:30 PM  Result Value Ref Range   Sodium 139 135 - 145 mEq/L   Potassium 3.8 3.5 - 5.3 mEq/L   Chloride 104 96 - 112 mEq/L   CO2 24 19 - 32 mEq/L   Glucose, Bld 99 70 - 99 mg/dL   BUN 15 6 - 23 mg/dL   Creat 0.65 0.50 - 1.10 mg/dL   Calcium 9.2 8.4 - 10.5 mg/dL  Hepatic function panel     Status: None   Collection Time: 11/22/14  4:30 PM  Result Value Ref Range   Total Bilirubin 0.6 0.2 - 1.2 mg/dL   Bilirubin, Direct 0.1 0.0 - 0.3 mg/dL   Indirect Bilirubin 0.5 0.2 - 1.2 mg/dL   Alkaline Phosphatase 74 39 - 117 U/L   AST 21 0 - 37 U/L   ALT 21 0 - 35 U/L    Total Protein 6.7 6.0 - 8.3 g/dL   Albumin 4.3 3.5 - 5.2 g/dL  Vit D  25 hydroxy (rtn osteoporosis monitoring)     Status: None   Collection Time: 11/22/14  4:30 PM  Result Value Ref Range   Vit D, 25-Hydroxy 47 30 - 100 ng/mL    Comment: Vitamin D Status           25-OH Vitamin D        Deficiency                <20 ng/mL        Insufficiency         20 - 29 ng/mL        Optimal             > or = 30 ng/mL   For 25-OH Vitamin D testing on patients on D2-supplementation and patients for whom quantitation of D2 and D3 fractions is required, the QuestAssureD 25-OH VIT D, (D2,D3), LC/MS/MS is recommended: order code [id number] (patients > 2 yrs).   TSH     Status: None   Collection Time: 11/22/14  4:30 PM  Result Value Ref Range   TSH 0.895 0.350 - 4.500 uIU/mL    Assessment/Plan: Acute bacterial bronchitis Rx azithromycin. Rx Tussionex for cough. Continue Mucinex. Increase hydration and get plenty of rest. Daily probiotic. Call or return to clinic if symptoms not improving.

## 2014-12-25 NOTE — Patient Instructions (Signed)
Please take the azithromycin as directed. Start a daily probiotic. Continue the Mucinex. Use the Tussionex as directed for cough. Place a humidifier. Increase fluid intake and get plenty of rest.  Call or return to clinic if symptoms are not improving.

## 2014-12-25 NOTE — Assessment & Plan Note (Signed)
Rx azithromycin. Rx Tussionex for cough. Continue Mucinex. Increase hydration and get plenty of rest. Daily probiotic. Call or return to clinic if symptoms not improving.

## 2015-03-18 LAB — LIPID PANEL
Cholesterol: 172 mg/dL (ref 0–200)
HDL: 67 mg/dL (ref 35–70)
LDL CALC: 97 mg/dL
LDl/HDL Ratio: 1.4
TRIGLYCERIDES: 117 mg/dL (ref 40–160)

## 2015-03-18 LAB — HEPATIC FUNCTION PANEL
ALK PHOS: 83 U/L (ref 25–125)
ALT: 23 U/L (ref 7–35)
AST: 24 U/L (ref 13–35)
BILIRUBIN, TOTAL: 0.8 mg/dL

## 2015-03-18 LAB — HEMOGLOBIN A1C: HEMOGLOBIN A1C: 5.5 % (ref 4.0–6.0)

## 2015-03-18 LAB — BASIC METABOLIC PANEL
BUN: 19 mg/dL (ref 4–21)
CREATININE: 0.7 mg/dL (ref <=1.1)
GLUCOSE: 93 mg/dL
POTASSIUM: 4.5 mmol/L (ref 3.4–5.3)
Sodium: 139 mmol/L (ref 137–147)

## 2015-03-18 LAB — TSH: TSH: 1.36 u[IU]/mL (ref <=5.90)

## 2015-03-18 LAB — CBC AND DIFFERENTIAL
HCT: 44 % (ref 36–46)
Hemoglobin: 14.9 g/dL (ref 12.0–16.0)
PLATELETS: 252 10*3/uL (ref 150–399)
WBC: 5.3 10*3/mL

## 2015-05-19 ENCOUNTER — Other Ambulatory Visit: Payer: Self-pay | Admitting: Family Medicine

## 2015-05-19 NOTE — Telephone Encounter (Signed)
Medication filled to pharmacy as requested.   

## 2015-05-22 ENCOUNTER — Telehealth: Payer: Self-pay | Admitting: Family Medicine

## 2015-05-22 NOTE — Telephone Encounter (Signed)
pre visit letter mailed 05/09/15 °

## 2015-05-28 ENCOUNTER — Telehealth: Payer: Self-pay

## 2015-05-28 NOTE — Telephone Encounter (Signed)
LMOVM

## 2015-05-29 ENCOUNTER — Encounter: Payer: 59 | Admitting: Family Medicine

## 2015-05-29 NOTE — Addendum Note (Signed)
Addended by: Eduard Roux E on: 05/29/2015 11:40 AM   Modules accepted: Orders, Medications

## 2015-05-29 NOTE — Telephone Encounter (Signed)
Pre-Visit Call completed with patient and chart updated.   Pre-Visit Info documented in Specialty Comments under [employer].    

## 2015-05-30 ENCOUNTER — Encounter: Payer: Self-pay | Admitting: Family Medicine

## 2015-05-30 ENCOUNTER — Encounter: Payer: Self-pay | Admitting: General Practice

## 2015-05-30 ENCOUNTER — Ambulatory Visit (INDEPENDENT_AMBULATORY_CARE_PROVIDER_SITE_OTHER): Payer: 59 | Admitting: Family Medicine

## 2015-05-30 VITALS — BP 124/80 | HR 78 | Temp 98.0°F | Resp 16 | Ht 66.0 in | Wt 199.2 lb

## 2015-05-30 DIAGNOSIS — M25562 Pain in left knee: Secondary | ICD-10-CM

## 2015-05-30 DIAGNOSIS — Z0000 Encounter for general adult medical examination without abnormal findings: Secondary | ICD-10-CM

## 2015-05-30 DIAGNOSIS — Z85828 Personal history of other malignant neoplasm of skin: Secondary | ICD-10-CM

## 2015-05-30 NOTE — Progress Notes (Signed)
   Subjective:    Patient ID: [name] [name], female    DOB: 1951-04-15, 64 y.o.   MRN: [id number]  HPI CPE- UTD on pap (Dr Dema Severin), mammo, DEXA, colonoscopy due 2019.  Pt plans to start diet on Monday and [facility]   Review of Systems Patient reports no vision/ hearing changes, adenopathy,fever, weight change,  persistant/recurrent hoarseness , swallowing issues, chest pain, palpitations, edema, persistant/recurrent cough, hemoptysis, dyspnea (rest/exertional/paroxysmal nocturnal), gastrointestinal bleeding (melena, rectal bleeding), abdominal pain, significant heartburn, bowel changes, GU symptoms (dysuria, hematuria, incontinence), Gyn symptoms (abnormal  bleeding, pain),  syncope, focal weakness, memory loss, numbness & tingling, skin/hair/nail changes, abnormal bruising or bleeding, anxiety, or depression.     Objective:   Physical Exam General Appearance:    Alert, cooperative, no distress, appears stated age  Head:    Normocephalic, without obvious abnormality, atraumatic  Eyes:    PERRL, conjunctiva/corneas clear, EOM's intact, fundi    benign, both eyes  Ears:    Normal TM's and external ear canals, both ears  Nose:   Nares normal, septum midline, mucosa normal, no drainage    or sinus tenderness  Throat:   Lips, mucosa, and tongue normal; teeth and gums normal  Neck:   Supple, symmetrical, trachea midline, no adenopathy;    Thyroid: no enlargement/tenderness/nodules  Back:     Symmetric, no curvature, ROM normal, no CVA tenderness  Lungs:     Clear to auscultation bilaterally, respirations unlabored  Chest Wall:    No tenderness or deformity   Heart:    Regular rate and rhythm, S1 and S2 normal, no murmur, rub   or gallop  Breast Exam:    Deferred to GYN  Abdomen:     Soft, non-tender, bowel sounds active all four quadrants,    no masses, no organomegaly  Genitalia:    Deferred to GYN  Rectal:    Extremities:   Extremities normal,  atraumatic, no cyanosis or edema  Pulses:   2+ and symmetric all extremities  Skin:   Skin color, texture, turgor normal, no rashes or lesions  Lymph nodes:   Cervical, supraclavicular, and axillary nodes normal  Neurologic:   CNII-XII intact, normal strength, sensation and reflexes    throughout          Assessment & Plan:

## 2015-05-30 NOTE — Patient Instructions (Signed)
Follow up in 6 months to recheck cholesterol Your labs look great!  Keep up the good work! Good luck on the diet!!  You can do it! Call with any questions or concerns Happy Labor Day!!!

## 2015-05-30 NOTE — Progress Notes (Signed)
Pre visit review using our clinic review tool, if applicable. No additional management support is needed unless otherwise documented below in the visit note. 

## 2015-06-02 NOTE — Assessment & Plan Note (Signed)
Pt's PE WNL w/ exception of obesity.  Stressed need for healthy diet and regular exercise.  UTD on GYN and colonoscopy.  Check labs.  Anticipatory guidance provided.

## 2015-06-11 ENCOUNTER — Other Ambulatory Visit: Payer: Self-pay | Admitting: Family Medicine

## 2015-06-11 NOTE — Telephone Encounter (Signed)
Medication filled to pharmacy as requested.   

## 2015-06-26 ENCOUNTER — Encounter: Payer: Self-pay | Admitting: Family Medicine

## 2015-06-27 NOTE — Telephone Encounter (Signed)
Called patient and scheduled nurse visit for EKG.

## 2015-07-01 ENCOUNTER — Encounter: Payer: Self-pay | Admitting: Family Medicine

## 2015-07-01 ENCOUNTER — Ambulatory Visit (INDEPENDENT_AMBULATORY_CARE_PROVIDER_SITE_OTHER): Payer: 59 | Admitting: Family Medicine

## 2015-07-01 DIAGNOSIS — Z23 Encounter for immunization: Secondary | ICD-10-CM | POA: Diagnosis not present

## 2015-07-01 DIAGNOSIS — Z01818 Encounter for other preprocedural examination: Secondary | ICD-10-CM | POA: Diagnosis not present

## 2015-07-01 NOTE — Progress Notes (Deleted)
Pre visit review using our clinic review tool, if applicable. No additional management support is needed unless otherwise documented below in the visit note. 

## 2015-07-01 NOTE — Progress Notes (Signed)
Pre visit review using our clinic review tool, if applicable. No additional management support is needed unless otherwise documented below in the visit note.  Patient presents today in office for an EKG.

## 2015-07-15 ENCOUNTER — Other Ambulatory Visit: Payer: Self-pay | Admitting: Family Medicine

## 2015-07-15 NOTE — Telephone Encounter (Signed)
Medication filled to pharmacy as requested.   

## 2015-08-08 ENCOUNTER — Ambulatory Visit (INDEPENDENT_AMBULATORY_CARE_PROVIDER_SITE_OTHER): Payer: 59 | Admitting: Internal Medicine

## 2015-08-08 ENCOUNTER — Encounter: Payer: Self-pay | Admitting: Internal Medicine

## 2015-08-08 VITALS — BP 124/78 | HR 75 | Temp 98.0°F | Ht 66.0 in | Wt 189.1 lb

## 2015-08-08 DIAGNOSIS — J00 Acute nasopharyngitis [common cold]: Secondary | ICD-10-CM

## 2015-08-08 DIAGNOSIS — B9689 Other specified bacterial agents as the cause of diseases classified elsewhere: Secondary | ICD-10-CM

## 2015-08-08 DIAGNOSIS — J208 Acute bronchitis due to other specified organisms: Principal | ICD-10-CM

## 2015-08-08 MED ORDER — AZITHROMYCIN 250 MG PO TABS: ORAL_TABLET | ORAL | Status: DC

## 2015-08-08 NOTE — Progress Notes (Signed)
Pre visit review using our clinic review tool, if applicable. No additional management support is needed unless otherwise documented below in the visit note. 

## 2015-08-08 NOTE — Patient Instructions (Signed)
Rest, fluids , tylenol  For cough: Take Mucinex DM twice a day as needed until better  For nasal congestion Use OTC  Flonase : 2 nasal sprays on each side of the nose daily until you feel better    Take the antibiotic as prescribed  (Zithromax)  Call if not gradually better over the next  10 days  Call anytime if the symptoms are severe

## 2015-08-08 NOTE — Progress Notes (Signed)
Subjective:    Patient ID: [name] [name], female    DOB: [birth year], 64 y.o.   MRN: [id number]  DOS:  08/08/2015 Type of visit - description : Acute visit Interval history: Symptoms started 5 weeks ago with sore throat, cough, chest congestion, some wheezing, initially he had green sputum. Overall better but has persisting cough with a small amount of clear sputum. Still has postnasal dripping. Took Mucinex  and it helped.   Review of Systems No fever chills Initially had sinus pain and congestion but that seems better. Still feels some chest congestion when she coughs. No nausea, vomiting, diarrhea.   Past Medical History  Diagnosis Date  . Allergy, unspecified not elsewhere classified   . Bronchitis   . Borderline hypertension   . Hypercholesterolemia   . GERD (gastroesophageal reflux disease)   . IBS (irritable bowel syndrome)   . DJD (degenerative joint disease)   . Vitamin D deficiency   . History of skin cancer     Past Surgical History  Procedure Laterality Date  . Knee arthroscopy  1987&2003    left  . Skin surgery  4/05    Melanoma surg left shoulder  . Hernia repair  1957  . Tubal ligation  1990  . Bunionectomy  2004    left  . Foot osteotomy  2003    right  . Shoulder arthroscopy with open rotator cuff repair  2013    right    Social History   Social History  . Marital Status: Married    Spouse Name: N/A  . Number of Children: N/A  . Years of Education: N/A   Occupational History  . Not on file.   Social History Main Topics  . Smoking status: Former Smoker -- 6 years    Quit date: 10/05/1975  . Smokeless tobacco: Never Used  . Alcohol Use: 6.0 oz/week    12 drink(s) per week     Comment: socially  . Drug Use: No  . Sexual Activity: Not on file   Other Topics Concern  . Not on file   Social History Narrative        Medication List       This list is accurate as of: 08/08/15 11:59 PM.  Always use your most recent med list.                 aspirin 325 MG tablet  Take 325 mg by mouth daily.     azithromycin 250 MG tablet  Commonly known as:  ZITHROMAX  Take 2 tablets on day 1. Then take 1 tablet daily.     CoQ10 200 MG Caps  Take 1 tablet by mouth 2 (two) times daily.     cyanocobalamin 1000 MCG/ML injection  Commonly known as:  (VITAMIN B-12)  Inject into the muscle.     DHEA 10 MG Tabs  Take 5 mg by mouth daily.     DORYX 50 MG Tbec  Generic drug:  Doxycycline Hyclate  Take 1 tablet by mouth daily.     MAGNESIUM GLYCINATE PLUS 110 MG Caps  Generic drug:  Magnesium  Take 200 mg by mouth daily.     meloxicam 15 MG tablet  Commonly known as:  MOBIC  Take 1 tablet by mouth daily as needed.     MULTIVITAMIN PO  Take 1 tablet by mouth daily.     Nattokinase 100 MG Caps  Take 1 capsule by mouth daily.     omeprazole  20 MG capsule  Commonly known as:  PRILOSEC  TAKE ONE CAPSULE BY MOUTH DAILY     simvastatin 20 MG tablet  Commonly known as:  ZOCOR  TAKE 1 TABLET BY MOUTH EVERY NIGHT AT BEDTIME     thyroid 90 MG tablet  Commonly known as:  ARMOUR  Take 90 mg by mouth daily.     Vitamin D3 5000 UNITS Caps  Take 1 capsule by mouth daily.           Objective:   Physical Exam BP 124/78 mmHg  Pulse 75  Temp(Src) 98 F (36.7 C) (Oral)  Ht 5\' 6"  (1.676 m)  Wt 189 lb 2 oz (85.787 kg)  BMI 30.54 kg/m2  SpO2 97% General:   Well developed, well nourished . NAD.  HEENT:  Normocephalic . Face symmetric, atraumatic. TMs normal, nose slightly congested, sinuses no TTP Lungs:  CTA B (few rhonchi only with cough) Normal respiratory effort, no intercostal retractions, no accessory muscle use. Heart: RRR,  no murmur.  No pretibial edema bilaterally  Skin: Not pale. Not jaundice Neurologic:  alert & oriented X3.  Speech normal, gait appropriate for age and unassisted Psych--  Cognition and judgment appear intact.  Cooperative with normal attention span and concentration.  Behavior  appropriate. No anxious or depressed appearing.      Assessment & Plan:   Cough, likely bronchitis: Persisting cough for 5 weeks, no evidence of pneumonia on clinical grounds. Atypical bronchitis?. Plan: Zithromax (despite her allergies, she tolerates this antibiotic well), Flonase, Mucinex. We agreed she will call next week if she is not getting better, we'll get a chest x-ray. Declined hydrocodone for cough control.

## 2015-08-13 ENCOUNTER — Encounter (HOSPITAL_COMMUNITY): Payer: Self-pay | Admitting: Physician Assistant

## 2015-08-13 DIAGNOSIS — R030 Elevated blood-pressure reading, without diagnosis of hypertension: Secondary | ICD-10-CM | POA: Diagnosis present

## 2015-08-13 DIAGNOSIS — K589 Irritable bowel syndrome without diarrhea: Secondary | ICD-10-CM | POA: Diagnosis present

## 2015-08-13 DIAGNOSIS — J40 Bronchitis, not specified as acute or chronic: Secondary | ICD-10-CM | POA: Diagnosis present

## 2015-08-13 DIAGNOSIS — M1712 Unilateral primary osteoarthritis, left knee: Secondary | ICD-10-CM | POA: Diagnosis present

## 2015-08-13 NOTE — H&P (Signed)
TOTAL KNEE ADMISSION H&P  Patient is being admitted for left total knee arthroplasty.  Subjective:  Chief Complaint:left knee pain.  HPI:  [name] [name], 64 y.o. female, has a history of pain and functional disability in the left knee due to arthritis and has failed non-surgical conservative treatments for greater than 12 weeks to includeNSAID's and/or analgesics, corticosteriod injections, viscosupplementation injections, flexibility and strengthening excercises, supervised PT with diminished ADL's post treatment, weight reduction as appropriate and activity modification.  Onset of symptoms was gradual, starting 10 years ago with gradually worsening course since that time. The patient noted prior procedures on the knee to include  arthroscopy and menisectomy on the left knee(s).  Patient currently rates pain in the left knee(s) at 10 out of 10 with activity. Patient has night pain, worsening of pain with activity and weight bearing, pain that interferes with activities of daily living, crepitus and joint swelling.  Patient has evidence of subchondral sclerosis, periarticular osteophytes and joint space narrowing by imaging studies.There is no active infection.  Patient Active Problem List   Diagnosis Date Noted  . Primary localized osteoarthritis of left knee   . Bronchitis   . Borderline hypertension   . Hypercholesterolemia   . IBS (irritable bowel syndrome)   . Physical exam 05/30/2015  . Acute bacterial bronchitis 12/25/2014  . Hypothyroidism 11/22/2014  . Vitamin D deficiency 09/21/2009  . Hyperlipidemia 09/21/2009  . Left knee pain 09/15/2009  . SKIN CANCER, HX OF 09/15/2009  . Anxiety state 12/19/2007  . HYPERTENSION, BORDERLINE 12/19/2007  . BRONCHITIS 12/19/2007  . GERD 12/19/2007  . IBS 12/19/2007  . ALLERGY 12/19/2007   Past Medical History  Diagnosis Date  . Allergy, unspecified not elsewhere classified   . Bronchitis   . Borderline hypertension   .  Hypercholesterolemia   . GERD (gastroesophageal reflux disease)   . IBS (irritable bowel syndrome)   . DJD (degenerative joint disease)   . Vitamin D deficiency   . History of skin cancer   . Primary localized osteoarthritis of left knee     Past Surgical History  Procedure Laterality Date  . Knee arthroscopy  1987&2003    left  . Skin surgery  4/05    Melanoma surg left shoulder  . Hernia repair  1957  . Tubal ligation  1990  . Bunionectomy  2004    left  . Foot osteotomy  2003    right  . Shoulder arthroscopy with open rotator cuff repair  2013    right    No prescriptions prior to admission   Allergies  Allergen Reactions  . Augmentin [Amoxicillin-Pot Clavulanate] Diarrhea  . Clindamycin/Lincomycin Diarrhea  . Flagyl [Metronidazole Hcl] Diarrhea    Stomach upset. Pt states she CANNOT TAKE oral, has no problem with topical.   . Telithromycin Diarrhea    Social History  Substance Use Topics  . Smoking status: Former Smoker -- 6 years    Quit date: 10/05/1975  . Smokeless tobacco: Never Used  . Alcohol Use: 6.0 oz/week    12 drink(s) per week     Comment: socially    Family History  Problem Relation Age of Onset  . Colon polyps Father   . Colon cancer Neg Hx   . Stomach cancer Neg Hx   . Diabetes Son      Review of Systems  Constitutional: Negative.   HENT: Negative.   Eyes: Negative.   Respiratory: Positive for cough and sputum production. Negative for hemoptysis, shortness of breath  and wheezing.   Cardiovascular: Negative.   Gastrointestinal: Negative.   Genitourinary: Negative.   Musculoskeletal: Positive for back pain and joint pain.       Left knee  Skin: Negative.   Neurological: Negative.   Endo/Heme/Allergies: Negative.   Psychiatric/Behavioral: Negative.     Objective:  Physical Exam  Constitutional: She is oriented to person, place, and time. She appears well-developed and well-nourished.  HENT:  Head: Normocephalic and atraumatic.   Mouth/Throat: Oropharynx is clear and moist.  Eyes: Conjunctivae are normal. Pupils are equal, round, and reactive to light.  Neck: Normal range of motion. Neck supple.  Cardiovascular: Normal rate and regular rhythm.   Respiratory: Effort normal and breath sounds normal.  GI: Soft. Bowel sounds are normal.  Genitourinary:  Not pertinent to current symptomatology therefore not examined.  Musculoskeletal:  Examination of her left knee reveals valgus deformity with 1+ synovitis and pain.  Range of motion from -5 to 115 degrees.  Knee is stable with 2+ crepitation.  1+ synovitis.  Normal patella tracking.  Examination of her right knee reveals full range of motion without pain, swelling, weakness or instability. Vascular exam: Pulses are 2+ and symmetric.   Neurological: She is alert and oriented to person, place, and time.  Skin: Skin is warm and dry.  Psychiatric: She has a normal mood and affect. Her behavior is normal.    Vital signs in last 24 hours:    Labs:   Estimated body mass index is 32.18 kg/(m^2) as calculated from the following:   Height as of 05/30/15: 5\' 6"  (1.676 m).   Weight as of 05/30/15: 90.379 kg (199 lb 4 oz).   Imaging Review Plain radiographs demonstrate severe degenerative joint disease of the left knee(s). The overall alignment issignificant valgus. The bone quality appears to be good for age and reported activity level.  Assessment/Plan:  End stage arthritis, left knee  Principal Problem:   Primary localized osteoarthritis of left knee Active Problems:   Vitamin D deficiency   Hyperlipidemia   Anxiety state   BRONCHITIS   Bronchitis   Borderline hypertension   Hypercholesterolemia   IBS (irritable bowel syndrome)   The patient history, physical examination, clinical judgment of the provider and imaging studies are consistent with end stage degenerative joint disease of the left knee(s) and total knee arthroplasty is deemed medically necessary. The  treatment options including medical management, injection therapy arthroscopy and arthroplasty were discussed at length. The risks and benefits of total knee arthroplasty were presented and reviewed. The risks due to aseptic loosening, infection, stiffness, patella tracking problems, thromboembolic complications and other imponderables were discussed. The patient acknowledged the explanation, agreed to proceed with the plan and consent was signed. Patient is being admitted for inpatient treatment for surgery, pain control, PT, OT, prophylactic antibiotics, VTE prophylaxis, progressive ambulation and ADL's and discharge planning. The patient is planning to be discharged home with home health services

## 2015-08-14 ENCOUNTER — Encounter: Payer: Self-pay | Admitting: Internal Medicine

## 2015-08-14 ENCOUNTER — Other Ambulatory Visit: Payer: Self-pay | Admitting: Internal Medicine

## 2015-08-14 DIAGNOSIS — R05 Cough: Secondary | ICD-10-CM

## 2015-08-14 DIAGNOSIS — R059 Cough, unspecified: Secondary | ICD-10-CM

## 2015-08-14 MED ORDER — BUDESONIDE-FORMOTEROL FUMARATE 160-4.5 MCG/ACT IN AERO: 2.0000 | INHALATION_SPRAY | Freq: Two times a day (BID) | RESPIRATORY_TRACT | Status: DC

## 2015-08-21 ENCOUNTER — Ambulatory Visit (HOSPITAL_COMMUNITY)
Admission: RE | Admit: 2015-08-21 | Discharge: 2015-08-21 | Disposition: A | Discharge status: Home / Self Care | Payer: 59 | Source: Ambulatory Visit | Attending: Physician Assistant | Admitting: Physician Assistant

## 2015-08-21 ENCOUNTER — Encounter (HOSPITAL_COMMUNITY)
Admission: RE | Admit: 2015-08-21 | Discharge: 2015-08-21 | Disposition: A | Discharge status: Home / Self Care | Payer: 59 | Source: Ambulatory Visit | Attending: Orthopedic Surgery | Admitting: Orthopedic Surgery

## 2015-08-21 ENCOUNTER — Encounter (HOSPITAL_COMMUNITY): Payer: Self-pay

## 2015-08-21 DIAGNOSIS — Z01812 Encounter for preprocedural laboratory examination: Secondary | ICD-10-CM | POA: Diagnosis not present

## 2015-08-21 DIAGNOSIS — Z01818 Encounter for other preprocedural examination: Secondary | ICD-10-CM | POA: Insufficient documentation

## 2015-08-21 DIAGNOSIS — J069 Acute upper respiratory infection, unspecified: Secondary | ICD-10-CM

## 2015-08-21 HISTORY — DX: Hypothyroidism, unspecified: E03.9

## 2015-08-21 HISTORY — DX: Malignant melanoma of skin, unspecified: C43.9

## 2015-08-21 HISTORY — DX: Nausea with vomiting, unspecified: R11.2

## 2015-08-21 HISTORY — DX: Other specified postprocedural states: Z98.890

## 2015-08-21 LAB — COMPREHENSIVE METABOLIC PANEL
ALBUMIN: 4.5 g/dL (ref 3.5–5.0)
ALT: 29 U/L (ref 14–54)
AST: 34 U/L (ref 15–41)
Alkaline Phosphatase: 91 U/L (ref 38–126)
Anion gap: 10 (ref 5–15)
BUN: 15 mg/dL (ref 6–20)
CHLORIDE: 103 mmol/L (ref 101–111)
CO2: 26 mmol/L (ref 22–32)
CREATININE: 0.75 mg/dL (ref 0.44–1.00)
Calcium: 9.6 mg/dL (ref 8.9–10.3)
GFR calc non Af Amer: >60 mL/min (ref >60)
GLUCOSE: 101 mg/dL — AB (ref 65–99)
Potassium: 3.6 mmol/L (ref 3.5–5.1)
SODIUM: 139 mmol/L (ref 135–145)
Total Bilirubin: 0.7 mg/dL (ref 0.3–1.2)
Total Protein: 7.7 g/dL (ref 6.5–8.1)

## 2015-08-21 LAB — CBC WITH DIFFERENTIAL/PLATELET
BASOS ABS: 0 10*3/uL (ref 0.0–0.1)
Basophils Relative: 0 %
EOS PCT: 1 %
Eosinophils Absolute: 0.1 10*3/uL (ref 0.0–0.7)
HEMATOCRIT: 42.4 % (ref 36.0–46.0)
Hemoglobin: 14.1 g/dL (ref 12.0–15.0)
LYMPHS ABS: 2.2 10*3/uL (ref 0.7–4.0)
LYMPHS PCT: 32 %
MCH: 28.3 pg (ref 26.0–34.0)
MCHC: 33.3 g/dL (ref 30.0–36.0)
MCV: 85 fL (ref 78.0–100.0)
MONO ABS: 0.4 10*3/uL (ref 0.1–1.0)
MONOS PCT: 5 %
Neutro Abs: 4.2 10*3/uL (ref 1.7–7.7)
Neutrophils Relative %: 62 %
Platelets: 256 10*3/uL (ref 150–400)
RBC: 4.99 MIL/uL (ref 3.87–5.11)
RDW: 15.6 % — ABNORMAL HIGH (ref 11.5–15.5)
WBC: 6.9 10*3/uL (ref 4.0–10.5)

## 2015-08-21 LAB — TYPE AND SCREEN
ABO/RH(D): A POS
Antibody Screen: NEGATIVE

## 2015-08-21 LAB — PROTIME-INR
INR: 1.11 (ref 0.00–1.49)
Prothrombin Time: 14.5 seconds (ref 11.6–15.2)

## 2015-08-21 LAB — SURGICAL PCR SCREEN
MRSA, PCR: NEGATIVE
Staphylococcus aureus: NEGATIVE

## 2015-08-21 LAB — ABO/RH: ABO/RH(D): A POS

## 2015-08-21 LAB — APTT: APTT: 29 s (ref 24–37)

## 2015-08-21 NOTE — Pre-Procedure Instructions (Signed)
    [name] [name]  08/21/2015      [id number] - [name], [name] - [address] AT [address] [place] [address] Phone: [phone] Fax: [phone]    Your procedure is scheduled on 09-01-2015   Monday    Report to [facility] Admitting at 5:30 A.M .  Call this number if you have problems the morning of surgery:  [phone]   Remember:  Do not eat food or drink liquids after midnight.   Take these medicines the morning of surgery with A SIP OF WATER  Symbicort inhaler if needed,omeprazole(Prilosec),Thyroid(Armour)   Do not wear jewelry, make-up or nail polish.  Do not wear lotions, powders, or perfumes.  You may not wear deodorant.   Do not shave 48 hours prior to surgery.  .  Do not bring valuables to the hospital.  [facility] is not responsible for any belongings or valuables.  Contacts, dentures or bridgework may not be worn into surgery.  Leave your suitcase in the car.  After surgery it may be brought to your room.  For patients admitted to the hospital, discharge time will be determined by your treatment team.     Special instructions:  See attached Sheet for instructions of CHG shower  Please read over the following fact sheets that you were given. Pain Booklet, Coughing and Deep Breathing, Blood Transfusion Information and Surgical Site Infection Prevention

## 2015-08-22 ENCOUNTER — Encounter: Payer: Self-pay | Admitting: Family Medicine

## 2015-08-22 LAB — URINE CULTURE

## 2015-08-29 MED ORDER — CHLORHEXIDINE GLUCONATE 4 % EX LIQD: 60.0000 mL | Freq: Once | CUTANEOUS | Status: DC

## 2015-08-29 MED ORDER — LACTATED RINGERS IV SOLN
INTRAVENOUS | Status: AC
  Administered 2015-09-01 (×2): via INTRAVENOUS

## 2015-08-29 MED ORDER — CHLORHEXIDINE GLUCONATE 4 % EX LIQD
60.0000 mL | CUTANEOUS | Status: DC
Start: 2015-08-31 — End: 2015-09-01

## 2015-08-29 MED ORDER — POVIDONE-IODINE 7.5 % EX SOLN
Freq: Once | CUTANEOUS | Status: DC
  Filled 2015-08-29: qty 118

## 2015-08-29 MED ORDER — CEFAZOLIN SODIUM-DEXTROSE 2-3 GM-% IV SOLR
2.0000 g | INTRAVENOUS | Status: AC
  Administered 2015-09-01: 2 g via INTRAVENOUS
  Filled 2015-08-29: qty 50

## 2015-09-01 ENCOUNTER — Inpatient Hospital Stay (HOSPITAL_COMMUNITY): Payer: 59 | Admitting: Critical Care Medicine

## 2015-09-01 ENCOUNTER — Encounter (HOSPITAL_COMMUNITY)
Admission: RE | Disposition: A | Discharge status: Home Health | Payer: Self-pay | Source: Ambulatory Visit | Attending: Orthopedic Surgery

## 2015-09-01 ENCOUNTER — Encounter (HOSPITAL_COMMUNITY): Payer: Self-pay | Admitting: *Deleted

## 2015-09-01 ENCOUNTER — Inpatient Hospital Stay (HOSPITAL_COMMUNITY)
Admission: RE | Admit: 2015-09-01 | Discharge: 2015-09-03 | DRG: 470 | Disposition: A | Discharge status: Home Health | Payer: 59 | Source: Ambulatory Visit | Attending: Orthopedic Surgery | Admitting: Orthopedic Surgery

## 2015-09-01 DIAGNOSIS — J209 Acute bronchitis, unspecified: Secondary | ICD-10-CM | POA: Diagnosis present

## 2015-09-01 DIAGNOSIS — M25562 Pain in left knee: Secondary | ICD-10-CM | POA: Diagnosis present

## 2015-09-01 DIAGNOSIS — E039 Hypothyroidism, unspecified: Secondary | ICD-10-CM | POA: Diagnosis present

## 2015-09-01 DIAGNOSIS — E559 Vitamin D deficiency, unspecified: Secondary | ICD-10-CM | POA: Diagnosis present

## 2015-09-01 DIAGNOSIS — Z87891 Personal history of nicotine dependence: Secondary | ICD-10-CM

## 2015-09-01 DIAGNOSIS — K9189 Other postprocedural complications and disorders of digestive system: Secondary | ICD-10-CM | POA: Diagnosis not present

## 2015-09-01 DIAGNOSIS — Z7982 Long term (current) use of aspirin: Secondary | ICD-10-CM | POA: Diagnosis not present

## 2015-09-01 DIAGNOSIS — F411 Generalized anxiety disorder: Secondary | ICD-10-CM | POA: Diagnosis present

## 2015-09-01 DIAGNOSIS — M1712 Unilateral primary osteoarthritis, left knee: Principal | ICD-10-CM | POA: Diagnosis present

## 2015-09-01 DIAGNOSIS — K219 Gastro-esophageal reflux disease without esophagitis: Secondary | ICD-10-CM | POA: Diagnosis present

## 2015-09-01 DIAGNOSIS — E785 Hyperlipidemia, unspecified: Secondary | ICD-10-CM | POA: Diagnosis present

## 2015-09-01 DIAGNOSIS — J40 Bronchitis, not specified as acute or chronic: Secondary | ICD-10-CM | POA: Diagnosis present

## 2015-09-01 DIAGNOSIS — F419 Anxiety disorder, unspecified: Secondary | ICD-10-CM | POA: Diagnosis present

## 2015-09-01 DIAGNOSIS — E7800 Pure hypercholesterolemia, unspecified: Secondary | ICD-10-CM | POA: Diagnosis present

## 2015-09-01 DIAGNOSIS — Z7951 Long term (current) use of inhaled steroids: Secondary | ICD-10-CM | POA: Diagnosis not present

## 2015-09-01 DIAGNOSIS — R112 Nausea with vomiting, unspecified: Secondary | ICD-10-CM | POA: Diagnosis not present

## 2015-09-01 DIAGNOSIS — M179 Osteoarthritis of knee, unspecified: Secondary | ICD-10-CM | POA: Diagnosis present

## 2015-09-01 DIAGNOSIS — R030 Elevated blood-pressure reading, without diagnosis of hypertension: Secondary | ICD-10-CM | POA: Diagnosis present

## 2015-09-01 DIAGNOSIS — I10 Essential (primary) hypertension: Secondary | ICD-10-CM | POA: Diagnosis present

## 2015-09-01 DIAGNOSIS — Z888 Allergy status to other drugs, medicaments and biological substances status: Secondary | ICD-10-CM

## 2015-09-01 DIAGNOSIS — Z881 Allergy status to other antibiotic agents status: Secondary | ICD-10-CM

## 2015-09-01 DIAGNOSIS — M1710 Unilateral primary osteoarthritis, unspecified knee: Secondary | ICD-10-CM | POA: Diagnosis present

## 2015-09-01 DIAGNOSIS — K589 Irritable bowel syndrome without diarrhea: Secondary | ICD-10-CM | POA: Diagnosis present

## 2015-09-01 DIAGNOSIS — Z9889 Other specified postprocedural states: Secondary | ICD-10-CM | POA: Diagnosis not present

## 2015-09-01 HISTORY — DX: Nausea with vomiting, unspecified: R11.2

## 2015-09-01 HISTORY — PX: TOTAL KNEE ARTHROPLASTY: SHX125

## 2015-09-01 HISTORY — DX: Unilateral primary osteoarthritis, left knee: M17.12

## 2015-09-01 HISTORY — DX: Other specified postprocedural states: Z98.890

## 2015-09-01 SURGERY — ARTHROPLASTY, KNEE, TOTAL
Anesthesia: Regional | Site: Knee | Laterality: Left

## 2015-09-01 MED ORDER — ACETAMINOPHEN 325 MG PO TABS: 650.0000 mg | ORAL_TABLET | Freq: Four times a day (QID) | ORAL | Status: DC | PRN

## 2015-09-01 MED ORDER — MIDAZOLAM HCL 5 MG/5ML IJ SOLN
INTRAMUSCULAR | Status: DC | PRN
  Administered 2015-09-01: 2 mg via INTRAVENOUS

## 2015-09-01 MED ORDER — PROPOFOL 10 MG/ML IV BOLUS
INTRAVENOUS | Status: DC | PRN
  Administered 2015-09-01: 10 mg via INTRAVENOUS

## 2015-09-01 MED ORDER — ALUM & MAG HYDROXIDE-SIMETH 200-200-20 MG/5ML PO SUSP: 30.0000 mL | ORAL | Status: DC | PRN

## 2015-09-01 MED ORDER — FENTANYL CITRATE (PF) 250 MCG/5ML IJ SOLN
INTRAMUSCULAR | Status: AC
  Filled 2015-09-01: qty 5

## 2015-09-01 MED ORDER — BUPIVACAINE-EPINEPHRINE (PF) 0.5% -1:200000 IJ SOLN
INTRAMUSCULAR | Status: DC | PRN
  Administered 2015-09-01: 30 mL via PERINEURAL

## 2015-09-01 MED ORDER — MIDAZOLAM HCL 2 MG/2ML IJ SOLN
INTRAMUSCULAR | Status: AC
  Filled 2015-09-01: qty 2

## 2015-09-01 MED ORDER — CELECOXIB 200 MG PO CAPS
200.0000 mg | ORAL_CAPSULE | Freq: Two times a day (BID) | ORAL | Status: DC
  Administered 2015-09-01 – 2015-09-03 (×4): 200 mg via ORAL
  Filled 2015-09-01 (×5): qty 1

## 2015-09-01 MED ORDER — EPHEDRINE SULFATE 50 MG/ML IJ SOLN
INTRAMUSCULAR | Status: AC
  Filled 2015-09-01: qty 1

## 2015-09-01 MED ORDER — DEXAMETHASONE SODIUM PHOSPHATE 10 MG/ML IJ SOLN
INTRAMUSCULAR | Status: AC
  Filled 2015-09-01: qty 1

## 2015-09-01 MED ORDER — SODIUM CHLORIDE 0.9 % IJ SOLN
INTRAMUSCULAR | Status: AC
  Filled 2015-09-01: qty 10

## 2015-09-01 MED ORDER — ONDANSETRON HCL 4 MG/2ML IJ SOLN
4.0000 mg | Freq: Four times a day (QID) | INTRAMUSCULAR | Status: DC | PRN
  Administered 2015-09-02 (×2): 4 mg via INTRAVENOUS
  Filled 2015-09-01 (×2): qty 2

## 2015-09-01 MED ORDER — ONDANSETRON HCL 4 MG PO TABS
4.0000 mg | ORAL_TABLET | Freq: Four times a day (QID) | ORAL | Status: DC | PRN
  Administered 2015-09-03: 4 mg via ORAL
  Filled 2015-09-01: qty 1

## 2015-09-01 MED ORDER — BUPIVACAINE-EPINEPHRINE 0.25% -1:200000 IJ SOLN
INTRAMUSCULAR | Status: DC | PRN
  Administered 2015-09-01: 30 mL

## 2015-09-01 MED ORDER — DIPHENHYDRAMINE HCL 12.5 MG/5ML PO ELIX: 12.5000 mg | ORAL_SOLUTION | ORAL | Status: DC | PRN

## 2015-09-01 MED ORDER — PHENYLEPHRINE HCL 10 MG/ML IJ SOLN
10.0000 mg | INTRAVENOUS | Status: DC | PRN
  Administered 2015-09-01: 10 ug/min via INTRAVENOUS

## 2015-09-01 MED ORDER — METOCLOPRAMIDE HCL 5 MG PO TABS: 5.0000 mg | ORAL_TABLET | Freq: Three times a day (TID) | ORAL | Status: DC | PRN

## 2015-09-01 MED ORDER — DOXYCYCLINE HYCLATE 50 MG PO CAPS
50.0000 mg | ORAL_CAPSULE | Freq: Every day | ORAL | Status: DC
  Administered 2015-09-01 – 2015-09-03 (×3): 50 mg via ORAL
  Filled 2015-09-01 (×3): qty 1

## 2015-09-01 MED ORDER — HYDROMORPHONE HCL 1 MG/ML IJ SOLN
1.0000 mg | INTRAMUSCULAR | Status: DC | PRN
  Administered 2015-09-01 – 2015-09-02 (×2): 1 mg via INTRAVENOUS
  Filled 2015-09-01 (×2): qty 1

## 2015-09-01 MED ORDER — LIDOCAINE HCL (CARDIAC) 20 MG/ML IV SOLN
INTRAVENOUS | Status: AC
  Filled 2015-09-01: qty 5

## 2015-09-01 MED ORDER — THYROID 60 MG PO TABS
90.0000 mg | ORAL_TABLET | Freq: Every day | ORAL | Status: DC
  Administered 2015-09-02 – 2015-09-03 (×2): 90 mg via ORAL
  Filled 2015-09-01 (×4): qty 1

## 2015-09-01 MED ORDER — METOCLOPRAMIDE HCL 5 MG/ML IJ SOLN: 5.0000 mg | Freq: Three times a day (TID) | INTRAMUSCULAR | Status: DC | PRN

## 2015-09-01 MED ORDER — BUPIVACAINE IN DEXTROSE 0.75-8.25 % IT SOLN
INTRATHECAL | Status: DC | PRN
  Administered 2015-09-01: 12 mg via INTRATHECAL

## 2015-09-01 MED ORDER — SODIUM CHLORIDE 0.9 % IR SOLN
Status: DC | PRN
  Administered 2015-09-01: 1000 mL

## 2015-09-01 MED ORDER — HYDROMORPHONE HCL 1 MG/ML IJ SOLN: 0.2500 mg | INTRAMUSCULAR | Status: DC | PRN

## 2015-09-01 MED ORDER — DOCUSATE SODIUM 100 MG PO CAPS
100.0000 mg | ORAL_CAPSULE | Freq: Two times a day (BID) | ORAL | Status: DC
  Administered 2015-09-01 – 2015-09-03 (×4): 100 mg via ORAL
  Filled 2015-09-01 (×4): qty 1

## 2015-09-01 MED ORDER — POTASSIUM CHLORIDE IN NACL 20-0.9 MEQ/L-% IV SOLN
INTRAVENOUS | Status: DC
  Administered 2015-09-01 (×2): via INTRAVENOUS
  Filled 2015-09-01 (×4): qty 1000

## 2015-09-01 MED ORDER — DEXAMETHASONE SODIUM PHOSPHATE 10 MG/ML IJ SOLN
10.0000 mg | Freq: Three times a day (TID) | INTRAMUSCULAR | Status: AC
  Administered 2015-09-01 – 2015-09-02 (×3): 10 mg via INTRAVENOUS
  Filled 2015-09-01 (×3): qty 1

## 2015-09-01 MED ORDER — CEFAZOLIN SODIUM-DEXTROSE 2-3 GM-% IV SOLR
2.0000 g | Freq: Four times a day (QID) | INTRAVENOUS | Status: AC
  Administered 2015-09-01 (×2): 2 g via INTRAVENOUS
  Filled 2015-09-01 (×3): qty 50

## 2015-09-01 MED ORDER — ONDANSETRON HCL 4 MG/2ML IJ SOLN
INTRAMUSCULAR | Status: DC | PRN
  Administered 2015-09-01: 4 mg via INTRAVENOUS

## 2015-09-01 MED ORDER — PROPOFOL 10 MG/ML IV BOLUS
INTRAVENOUS | Status: AC
  Filled 2015-09-01: qty 20

## 2015-09-01 MED ORDER — BUDESONIDE-FORMOTEROL FUMARATE 160-4.5 MCG/ACT IN AERO
2.0000 | INHALATION_SPRAY | Freq: Two times a day (BID) | RESPIRATORY_TRACT | Status: DC
  Administered 2015-09-01 – 2015-09-03 (×4): 2 via RESPIRATORY_TRACT
  Filled 2015-09-01 (×2): qty 6

## 2015-09-01 MED ORDER — SUCCINYLCHOLINE CHLORIDE 20 MG/ML IJ SOLN
INTRAMUSCULAR | Status: AC
  Filled 2015-09-01: qty 1

## 2015-09-01 MED ORDER — VITAMIN D3 25 MCG (1000 UNIT) PO TABS
5000.0000 [IU] | ORAL_TABLET | Freq: Every day | ORAL | Status: DC
  Administered 2015-09-01 – 2015-09-03 (×3): 5000 [IU] via ORAL
  Filled 2015-09-01 (×6): qty 5

## 2015-09-01 MED ORDER — MAGNESIUM 200 MG PO TABS
200.0000 mg | ORAL_TABLET | Freq: Every day | ORAL | Status: DC
  Administered 2015-09-01: 200 mg via ORAL
  Filled 2015-09-01 (×3): qty 1

## 2015-09-01 MED ORDER — DEXAMETHASONE SODIUM PHOSPHATE 10 MG/ML IJ SOLN
INTRAMUSCULAR | Status: DC | PRN
  Administered 2015-09-01: 10 mg via INTRAVENOUS

## 2015-09-01 MED ORDER — FLUTICASONE PROPIONATE 50 MCG/ACT NA SUSP
2.0000 | Freq: Every day | NASAL | Status: DC
  Administered 2015-09-01: 2 via NASAL
  Filled 2015-09-01: qty 16

## 2015-09-01 MED ORDER — PROMETHAZINE HCL 25 MG/ML IJ SOLN: 6.2500 mg | INTRAMUSCULAR | Status: DC | PRN

## 2015-09-01 MED ORDER — MENTHOL 3 MG MT LOZG: 1.0000 | LOZENGE | OROMUCOSAL | Status: DC | PRN

## 2015-09-01 MED ORDER — APIXABAN 2.5 MG PO TABS
2.5000 mg | ORAL_TABLET | Freq: Two times a day (BID) | ORAL | Status: DC
Start: 2015-09-02 — End: 2015-09-03
  Administered 2015-09-02 – 2015-09-03 (×3): 2.5 mg via ORAL
  Filled 2015-09-01 (×3): qty 1

## 2015-09-01 MED ORDER — SODIUM CHLORIDE 0.9 % IR SOLN
Status: DC | PRN
  Administered 2015-09-01: 3000 mL

## 2015-09-01 MED ORDER — SIMVASTATIN 20 MG PO TABS
20.0000 mg | ORAL_TABLET | Freq: Every day | ORAL | Status: DC
  Administered 2015-09-01 – 2015-09-02 (×2): 20 mg via ORAL
  Filled 2015-09-01 (×2): qty 1

## 2015-09-01 MED ORDER — ROCURONIUM BROMIDE 50 MG/5ML IV SOLN
INTRAVENOUS | Status: AC
  Filled 2015-09-01: qty 1

## 2015-09-01 MED ORDER — BUPIVACAINE-EPINEPHRINE (PF) 0.25% -1:200000 IJ SOLN
INTRAMUSCULAR | Status: AC
  Filled 2015-09-01: qty 30

## 2015-09-01 MED ORDER — PROPOFOL 500 MG/50ML IV EMUL
INTRAVENOUS | Status: DC | PRN
  Administered 2015-09-01: 50 ug/kg/min via INTRAVENOUS

## 2015-09-01 MED ORDER — PANTOPRAZOLE SODIUM 40 MG PO TBEC
40.0000 mg | DELAYED_RELEASE_TABLET | Freq: Every day | ORAL | Status: DC
  Administered 2015-09-02 – 2015-09-03 (×2): 40 mg via ORAL
  Filled 2015-09-01 (×2): qty 1

## 2015-09-01 MED ORDER — PHENOL 1.4 % MT LIQD: 1.0000 | OROMUCOSAL | Status: DC | PRN

## 2015-09-01 MED ORDER — FENTANYL CITRATE (PF) 100 MCG/2ML IJ SOLN
INTRAMUSCULAR | Status: DC | PRN
  Administered 2015-09-01: 25 ug via INTRAVENOUS
  Administered 2015-09-01: 50 ug via INTRAVENOUS
  Administered 2015-09-01: 25 ug via INTRAVENOUS

## 2015-09-01 MED ORDER — POLYETHYLENE GLYCOL 3350 17 G PO PACK: 17.0000 g | PACK | Freq: Every day | ORAL | Status: DC | PRN

## 2015-09-01 MED ORDER — OXYCODONE HCL 5 MG PO TABS
5.0000 mg | ORAL_TABLET | ORAL | Status: DC | PRN
  Administered 2015-09-01 (×4): 10 mg via ORAL
  Administered 2015-09-02: 5 mg via ORAL
  Administered 2015-09-02 – 2015-09-03 (×6): 10 mg via ORAL
  Filled 2015-09-01 (×3): qty 2
  Filled 2015-09-01: qty 1
  Filled 2015-09-01 (×8): qty 2

## 2015-09-01 MED ORDER — ACETAMINOPHEN 650 MG RE SUPP: 650.0000 mg | Freq: Four times a day (QID) | RECTAL | Status: DC | PRN

## 2015-09-01 SURGICAL SUPPLY — 76 items
APL SKNCLS STERI-STRIP NONHPOA (GAUZE/BANDAGES/DRESSINGS) ×1
BANDAGE ESMARK 6X9 LF (GAUZE/BANDAGES/DRESSINGS) ×1 IMPLANT
BENZOIN TINCTURE PRP APPL 2/3 (GAUZE/BANDAGES/DRESSINGS) ×3 IMPLANT
BLADE RECIP (BLADE) ×2 IMPLANT
BLADE SAGITTAL 25.0X1.19X90 (BLADE) ×2 IMPLANT
BLADE SAGITTAL 25.0X1.19X90MM (BLADE) ×1
BLADE SAW RECIP 87.9 MT (BLADE) IMPLANT
BLADE SAW SAG 90X13X1.27 (BLADE) ×3 IMPLANT
BLADE SURG 10 STRL SS (BLADE) ×6 IMPLANT
BNDG CMPR 9X6 STRL LF SNTH (GAUZE/BANDAGES/DRESSINGS) ×1
BNDG CMPR MED 15X6 ELC VLCR LF (GAUZE/BANDAGES/DRESSINGS) ×1
BNDG ELASTIC 6X15 VLCR STRL LF (GAUZE/BANDAGES/DRESSINGS) ×3 IMPLANT
BNDG ESMARK 6X9 LF (GAUZE/BANDAGES/DRESSINGS) ×3
BOWL SMART MIX CTS (DISPOSABLE) ×3 IMPLANT
CAPT KNEE TOTAL 3 ATTUNE ×2 IMPLANT
CEMENT HV SMART SET (Cement) ×6 IMPLANT
CLOSURE WOUND 1/2 X4 (GAUZE/BANDAGES/DRESSINGS) ×1
COVER SURGICAL LIGHT HANDLE (MISCELLANEOUS) ×3 IMPLANT
CUFF TOURNIQUET SINGLE 34IN LL (TOURNIQUET CUFF) ×3 IMPLANT
CUFF TOURNIQUET SINGLE 44IN (TOURNIQUET CUFF) IMPLANT
DECANTER SPIKE VIAL GLASS SM (MISCELLANEOUS) ×1 IMPLANT
DRAPE EXTREMITY T 121X128X90 (DRAPE) ×3 IMPLANT
DRAPE INCISE IOBAN 66X45 STRL (DRAPES) ×3 IMPLANT
DRAPE PROXIMA HALF (DRAPES) ×3 IMPLANT
DRAPE U-SHAPE 47X51 STRL (DRAPES) ×3 IMPLANT
DRSG AQUACEL AG ADV 3.5X14 (GAUZE/BANDAGES/DRESSINGS) ×3 IMPLANT
DRSG PAD ABDOMINAL 8X10 ST (GAUZE/BANDAGES/DRESSINGS) ×2 IMPLANT
DURAPREP 26ML APPLICATOR (WOUND CARE) ×4 IMPLANT
ELECT CAUTERY BLADE 6.4 (BLADE) ×3 IMPLANT
ELECT REM PT RETURN 9FT ADLT (ELECTROSURGICAL) ×3
ELECTRODE REM PT RTRN 9FT ADLT (ELECTROSURGICAL) ×1 IMPLANT
EVACUATOR 1/8 PVC DRAIN (DRAIN) IMPLANT
FACESHIELD WRAPAROUND (MASK) ×3 IMPLANT
FACESHIELD WRAPAROUND OR TEAM (MASK) ×1 IMPLANT
GAUZE SPONGE 4X4 12PLY STRL (GAUZE/BANDAGES/DRESSINGS) ×1 IMPLANT
GLOVE BIO SURGEON STRL SZ7 (GLOVE) ×3 IMPLANT
GLOVE BIOGEL PI IND STRL 7.0 (GLOVE) ×1 IMPLANT
GLOVE BIOGEL PI IND STRL 7.5 (GLOVE) ×1 IMPLANT
GLOVE BIOGEL PI INDICATOR 7.0 (GLOVE) ×2
GLOVE BIOGEL PI INDICATOR 7.5 (GLOVE) ×2
GLOVE SS BIOGEL STRL SZ 7.5 (GLOVE) ×1 IMPLANT
GLOVE SUPERSENSE BIOGEL SZ 7.5 (GLOVE) ×2
GOWN STRL REUS W/ TWL LRG LVL3 (GOWN DISPOSABLE) ×1 IMPLANT
GOWN STRL REUS W/ TWL XL LVL3 (GOWN DISPOSABLE) ×2 IMPLANT
GOWN STRL REUS W/TWL LRG LVL3 (GOWN DISPOSABLE) ×3
GOWN STRL REUS W/TWL XL LVL3 (GOWN DISPOSABLE) ×6
HANDPIECE INTERPULSE COAX TIP (DISPOSABLE) ×3
HOOD PEEL AWAY FACE SHEILD DIS (HOOD) ×6 IMPLANT
IMMOBILIZER KNEE 22 UNIV (SOFTGOODS) ×3 IMPLANT
KIT BASIN OR (CUSTOM PROCEDURE TRAY) ×3 IMPLANT
KIT ROOM TURNOVER OR (KITS) ×3 IMPLANT
MANIFOLD NEPTUNE II (INSTRUMENTS) ×3 IMPLANT
MARKER SKIN DUAL TIP RULER LAB (MISCELLANEOUS) ×3 IMPLANT
NS IRRIG 1000ML POUR BTL (IV SOLUTION) ×3 IMPLANT
PACK TOTAL JOINT (CUSTOM PROCEDURE TRAY) ×3 IMPLANT
PACK UNIVERSAL I (CUSTOM PROCEDURE TRAY) ×3 IMPLANT
PAD ARMBOARD 7.5X6 YLW CONV (MISCELLANEOUS) ×4 IMPLANT
PADDING CAST COTTON 6X4 STRL (CAST SUPPLIES) ×1 IMPLANT
RUBBERBAND STERILE (MISCELLANEOUS) ×3 IMPLANT
SET HNDPC FAN SPRY TIP SCT (DISPOSABLE) ×1 IMPLANT
STRIP CLOSURE SKIN 1/2X4 (GAUZE/BANDAGES/DRESSINGS) ×2 IMPLANT
SUCTION FRAZIER TIP 10 FR DISP (SUCTIONS) ×3 IMPLANT
SUT MNCRL AB 3-0 PS2 18 (SUTURE) ×3 IMPLANT
SUT VIC AB 0 CT1 27 (SUTURE) ×9
SUT VIC AB 0 CT1 27XBRD ANBCTR (SUTURE) ×2 IMPLANT
SUT VIC AB 1 CT1 27 (SUTURE) ×3
SUT VIC AB 1 CT1 27XBRD ANBCTR (SUTURE) ×1 IMPLANT
SUT VIC AB 2-0 CT1 27 (SUTURE) ×6
SUT VIC AB 2-0 CT1 TAPERPNT 27 (SUTURE) ×2 IMPLANT
SYR 30ML SLIP (SYRINGE) ×3 IMPLANT
TOWEL OR 17X24 6PK STRL BLUE (TOWEL DISPOSABLE) ×3 IMPLANT
TOWEL OR 17X26 10 PK STRL BLUE (TOWEL DISPOSABLE) ×3 IMPLANT
TRAY FOLEY CATH 16FR SILVER (SET/KITS/TRAYS/PACK) ×3 IMPLANT
TUBE CONNECTING 12'X1/4 (SUCTIONS) ×1
TUBE CONNECTING 12X1/4 (SUCTIONS) ×2 IMPLANT
YANKAUER SUCT BULB TIP NO VENT (SUCTIONS) ×3 IMPLANT

## 2015-09-01 NOTE — Progress Notes (Signed)
Utilization review completed.  

## 2015-09-01 NOTE — Anesthesia Preprocedure Evaluation (Addendum)
Anesthesia Evaluation  Patient identified by MRN, date of birth, ID band Patient awake    Reviewed: Allergy & Precautions, NPO status , Patient's Chart, lab work & pertinent test results  History of Anesthesia Complications (+) PONV  Airway Mallampati: II  TM Distance: >3 FB Neck ROM: Full    Dental  (+) Dental Advisory Given, Teeth Intact, Caps   Pulmonary former smoker,    breath sounds clear to auscultation       Cardiovascular hypertension,  Rhythm:Regular Rate:Normal     Neuro/Psych Anxiety    GI/Hepatic GERD  Medicated,  Endo/Other  Hypothyroidism   Renal/GU      Musculoskeletal  (+) Arthritis ,   Abdominal   Peds  Hematology   Anesthesia Other Findings   Reproductive/Obstetrics                           Anesthesia Physical Anesthesia Plan  ASA: II  Anesthesia Plan: Regional and Spinal   Post-op Pain Management:    Induction:   Airway Management Planned: Simple Face Mask  Additional Equipment:   Intra-op Plan:   Post-operative Plan:   Informed Consent: I have reviewed the patients History and Physical, chart, labs and discussed the procedure including the risks, benefits and alternatives for the proposed anesthesia with the patient or authorized representative who has indicated his/her understanding and acceptance.   Dental advisory given  Plan Discussed with: Anesthesiologist and Surgeon  Anesthesia Plan Comments:         Anesthesia Quick Evaluation

## 2015-09-01 NOTE — Evaluation (Signed)
Physical Therapy Evaluation Patient Details Name: [name] [name] MRN: [id number] DOB: [birth year] Today's Date: 09/01/2015   History of Present Illness  Pt admitted for L TKA PMHx: IBS, HTN, GERD  Clinical Impression  Pt had good affect today during therapy. She presents with decreased LE strength, ROM, and gait. Pt reported feeling dizzy after 12" on ambulation which improved with decreased activity. Education on HEP, safety with transfers, and use of CPM and bone foam, pt and husband verbally agreed. Pt would benefit from acute PT to improve strength, ROM, and safety with transfers. D/C to home with home health PT is most appropriate at this time.     Follow Up Recommendations Home health PT    Equipment Recommendations  None recommended by PT    Recommendations for Other Services       Precautions / Restrictions Precautions Precautions: Knee Restrictions Weight Bearing Restrictions: Yes LLE Weight Bearing: Weight bearing as tolerated      Mobility  Bed Mobility Overal bed mobility: Modified Independent             General bed mobility comments: HOB elevated with use of rail   Transfers Overall transfer level: Needs assistance   Transfers: Sit to/from Stand Sit to Stand: Min guard         General transfer comment: cues for hand placement and safety. Chair pulled to pt to sit secondary to dizziness  Ambulation/Gait Ambulation/Gait assistance: Min guard Ambulation Distance (Feet): 12 Feet Assistive device: Rolling walker (2 wheeled) Gait Pattern/deviations: Step-to pattern;Decreased stance time - left   Gait velocity interpretation: Below normal speed for age/gender General Gait Details: cues for looking up, breathing, position in RW  Stairs            Wheelchair Mobility    Modified Rankin (Stroke Patients Only)       Balance                                             Pertinent Vitals/Pain Pain Assessment: 0-10 Pain  Score: 3  Pain Location: left knee . Beginning of session, 5/10 with ambulation and 7/10 with bone foam Pain Descriptors / Indicators: Aching Pain Intervention(s): Limited activity within patient's tolerance;Monitored during session;Premedicated before session;Repositioned    Home Living Family/patient expects to be discharged to:: Private residence Living Arrangements: Spouse/significant other Available Help at Discharge: Family;Available 24 hours/day Type of Home: House Home Access: Level entry     Home Layout: Two level;Able to live on main level with bedroom/bathroom Home Equipment: Shower seat;Bedside commode;Walker - 2 wheels;Cane - single point      Prior Function Level of Independence: Independent               Hand Dominance        Extremity/Trunk Assessment   Upper Extremity Assessment: Overall WFL for tasks assessed           Lower Extremity Assessment: LLE deficits/detail   LLE Deficits / Details: as expected postop weakness and limited ROM  Cervical / Trunk Assessment: Normal  Communication   Communication: No difficulties  Cognition Arousal/Alertness: Awake/alert Behavior During Therapy: WFL for tasks assessed/performed Overall Cognitive Status: Within Functional Limits for tasks assessed                      General Comments      Exercises Total Joint  Exercises Ankle Circles/Pumps: AROM;Seated;Both;20 reps Quad Sets: AROM;Seated;Left;10 reps Heel Slides: Seated;Left;10 reps;AROM Straight Leg Raises: AROM;Seated;Left;5 reps      Assessment/Plan    PT Assessment Patient needs continued PT services  PT Diagnosis Difficulty walking;Acute pain   PT Problem List Decreased strength;Decreased range of motion;Decreased activity tolerance;Pain;Decreased knowledge of use of DME;Decreased knowledge of precautions;Decreased mobility  PT Treatment Interventions Gait training;DME instruction;Functional mobility training;Therapeutic  activities;Therapeutic exercise;Patient/family education   PT Goals (Current goals can be found in the Care Plan section) Acute Rehab PT Goals Patient Stated Goal: return home PT Goal Formulation: With patient/family Time For Goal Achievement: 09/08/15 Potential to Achieve Goals: Good    Frequency 7X/week   Barriers to discharge        Co-evaluation               End of Session Equipment Utilized During Treatment: Gait belt Activity Tolerance: Patient tolerated treatment well Patient left: in chair;with call bell/phone within reach;with family/visitor present           Time: [id number] PT Time Calculation (min) (ACUTE ONLY): 26 min   Charges:   PT Evaluation $Initial PT Evaluation Tier I: 1 Procedure PT Treatments $Therapeutic Activity: 8-22 mins   PT G CodesMelford Aase 09/01/2015, 1:13 PM Elwyn Reach, [place]

## 2015-09-01 NOTE — Interval H&P Note (Signed)
History and Physical Interval Note:  09/01/2015 7:05 AM  [name] [name]  has presented today for surgery, with the diagnosis of PRIMARY LOCALIZED OA LEFT KNEE  The various methods of treatment have been discussed with the patient and family. After consideration of risks, benefits and other options for treatment, the patient has consented to  Procedure(s): TOTAL KNEE ARTHROPLASTY (Left) as a surgical intervention .  The patient's history has been reviewed, patient examined, no change in status, stable for surgery.  I have reviewed the patient's chart and labs.  Questions were answered to the patient's satisfaction.     Elsie Saas A

## 2015-09-01 NOTE — Anesthesia Postprocedure Evaluation (Signed)
Anesthesia Post Note  Patient: [name] [name]  Procedure(s) Performed: Procedure(s) (LRB): TOTAL KNEE ARTHROPLASTY (Left)  Patient location during evaluation: PACU Anesthesia Type: Spinal Level of consciousness: awake and alert and patient cooperative Pain management: pain level controlled Vital Signs Assessment: post-procedure vital signs reviewed and stable Respiratory status: spontaneous breathing, nonlabored ventilation, respiratory function stable and patient connected to nasal cannula oxygen Cardiovascular status: blood pressure returned to baseline and stable Postop Assessment: No signs of nausea or vomiting, No headache, Spinal receding and Patient able to bend at knees Anesthetic complications: no    Last Vitals:  Filed Vitals:   09/01/15 1015 09/01/15 1030  BP: 135/81 129/82  Pulse: 71 72  Temp:    Resp: 16 13    Last Pain: There were no vitals filed for this visit.  LLE Motor Response:  (wiggle toes) LLE Sensation: Decreased RLE Motor Response: Responds to commands RLE Sensation: Decreased L Sensory Level: L5-Outer lower leg, top of foot, great toe R Sensory Level: S1-Sole of foot, small toes  Jaidah Lomax,E. Ramatoulaye Pack

## 2015-09-01 NOTE — Op Note (Signed)
MRN:     [id number] DOB/AGE:    [birth year] / 63 y.o.       OPERATIVE REPORT    DATE OF PROCEDURE:  09/01/2015       PREOPERATIVE DIAGNOSIS:  PRIMARY LOCALIZED OA LEFT KNEE      Estimated body mass index is 29.87 kg/(m^2) as calculated from the following:   Height as of this encounter: 5\' 6"  (1.676 m).   Weight as of this encounter: 83.915 kg (185 lb).                                                        POSTOPERATIVE DIAGNOSIS:   SAME                                                                      PROCEDURE:  Procedure(s): TOTAL KNEE ARTHROPLASTY Using Depuy Attune RP implants #5 Femur, #5Tibia, 49mm attune RP bearing, 32 Patella     SURGEON: Victorian Gunn A    ASSISTANT:  Kirstin Shepperson PA-C   (Present and scrubbed throughout the case, critical for assistance with exposure, retraction, instrumentation, and closure.)         ANESTHESIA: Spinal with Femoral Nerve Block  DRAINS: foley, 2 medium hemovac in knee   TOURNIQUET TIME: [id number]   COMPLICATIONS:  None     SPECIMENS: None   INDICATIONS FOR PROCEDURE: The patient has  OA LEFT KNEE, varus deformities, XR shows bone on bone arthritis. Patient has failed all conservative measures including anti-inflammatory medicines, narcotics, attempts at  exercise and weight loss, cortisone injections and viscosupplementation.  Risks and benefits of surgery have been discussed, questions answered.   DESCRIPTION OF PROCEDURE: The patient identified by armband, received  right femoral nerve block and IV antibiotics, in the holding area at [facility]. Patient taken to the operating room, appropriate anesthetic  monitors were attached General endotracheal anesthesia induced with  the patient in supine position, Foley catheter was inserted. Tourniquet  applied high to the operative thigh. Lateral post and foot positioner  applied to the table, the lower extremity was then prepped and draped  in usual sterile fashion from the ankle  to the tourniquet. Time-out procedure was performed. The limb was wrapped with an Esmarch bandage and the tourniquet inflated to 365 mmHg. We began the operation by making the anterior midline incision starting at handbreadth above the patella going over the patella 1 cm medial to and  4 cm distal to the tibial tubercle. Small bleeders in the skin and the  subcutaneous tissue identified and cauterized. Transverse retinaculum was incised and reflected medially and a medial parapatellar arthrotomy was accomplished. the patella was everted and theprepatellar fat pad resected. The superficial medial collateral  ligament was then elevated from anterior to posterior along the proximal  flare of the tibia and anterior half of the menisci resected. The knee was hyperflexed exposing bone on bone arthritis. Peripheral and notch osteophytes as well as the cruciate ligaments were then resected. We continued to  work our way around posteriorly along the proximal tibia, and externally  rotated the tibia subluxing it out from underneath the femur. A McHale  retractor was placed through the notch and a lateral Hohmann retractor  placed, and we then drilled through the proximal tibia in line with the  axis of the tibia followed by an intramedullary guide rod and 2-degree  posterior slope cutting guide. The tibial cutting guide was pinned into place  allowing resection of 6 mm of bone medially and about 4 mm of bone  laterally because of her valgus deformity. Satisfied with the tibial resection, we then  entered the distal femur 2 mm anterior to the PCL origin with the  intramedullary guide rod and applied the distal femoral cutting guide  set at 38mm, with 5 degrees of valgus. This was pinned along the  epicondylar axis. At this point, the distal femoral cut was accomplished without difficulty. We then sized for a #5 femoral component and pinned the guide in 3 degrees of external rotation.The chamfer cutting guide  was pinned into place. The anterior, posterior, and chamfer cuts were accomplished without difficulty followed by  the attune RP box cutting guide and the box cut. We also removed posterior osteophytes from the posterior femoral condyles. At this  time, the knee was brought into full extension. We checked our  extension and flexion gaps and found them symmetric at 25mm.  The patella thickness measured at 21 mm. We set the cutting guide at 13 and removed the posterior 8 mm  of the patella sized for 32 button and drilled the lollipop. The knee  was then once again hyperflexed exposing the proximal tibia. We sized for a #5 tibial base plate, applied the smokestack and the conical reamer followed by the the Delta fin keel punch. We then hammered into place the attune RP trial femoral component, inserted a 6-mm trial bearing, trial patellar button, and took the knee through range of motion from 0-130 degrees. No thumb pressure was required for patellar  tracking. At this point, all trial components were removed, a double batch of DePuy HV cement  was mixed and applied to all bony metallic mating surfaces except for the posterior condyles of the femur itself. In order, we  hammered into place the tibial tray and removed excess cement, the femoral component and removed excess cement, a 6-mm attune RP bearing  was inserted, and the knee brought to full extension with compression.  The patellar button was clamped into place, and excess cement  removed. While the cement cured the wound was irrigated out with normal saline solution pulse lavage, and medium Hemovac drains were placed.. Ligament stability and patellar tracking were checked and found to be excellent. The tourniquet was then released and hemostasis was obtained with cautery. The parapatellar arthrotomy was closed with  #1 ethibond suture. The subcutaneous tissue with 0 and 2-0 undyed  Vicryl suture, and 4-0 Monocryl.. A dressing of Xeroform,  4 x 4,  dressing sponges, Webril, and Ace wrap applied. Needle and sponge count were correct times 2.The patient awakened, extubated, and taken to recovery room without difficulty. Vascular status was normal, pulses 2+ and symmetric.   Kejuan Bekker A 09/01/2015, 8:51 AM

## 2015-09-01 NOTE — Anesthesia Procedure Notes (Addendum)
Spinal Patient location during procedure: OR Start time: 09/01/2015 7:15 AM End time: 09/01/2015 7:18 AM Preanesthetic Checklist Completed: patient identified, site marked, surgical consent, pre-op evaluation, timeout performed, IV checked, risks and benefits discussed and monitors and equipment checked Spinal Block Patient position: sitting Prep: Betadine Patient monitoring: heart rate, cardiac monitor, continuous pulse ox and blood pressure Approach: midline Location: L3-4 Injection technique: single-shot Needle Needle type: Quincke  Needle gauge: 25 G Needle length: 9 cm Needle insertion depth: 4 cm Assessment Sensory level: T6 Additional Notes Tolerated well  Anesthesia Regional Block:  Adductor canal block  Pre-Anesthetic Checklist: ,, timeout performed, Correct Patient, Correct Site, Correct Laterality, Correct Procedure, Correct Position, site marked, Risks and benefits discussed,  Surgical consent,  Pre-op evaluation,  At surgeon's request and post-op pain management  Laterality: Right, Left and Lower  Prep: chloraprep       Needles:   Needle Type: Echogenic Stimulator Needle     Needle Length: 9cm 9 cm   Needle insertion depth: 5 cm   Additional Needles:  Procedures: ultrasound guided (picture in chart) Adductor canal block Narrative:  Start time: 09/01/2015 6:55 AM End time: 09/01/2015 7:10 AM Injection made incrementally with aspirations every 5 mL.  Performed by: Personally  Anesthesiologist: Blessyn [place]  Additional Notes: Tolerated well

## 2015-09-01 NOTE — Progress Notes (Signed)
Orthopedic Tech Progress Note Patient Details:  [name] [name] 1951-05-21 [id number]  CPM Left Knee CPM Left Knee: On Left Knee Flexion (Degrees): 90 Left Knee Extension (Degrees): 0 Additional Comments: Trapeze bar and foot roll   [name] 09/01/2015, 10:39 AM

## 2015-09-01 NOTE — Transfer of Care (Signed)
Immediate Anesthesia Transfer of Care Note  Patient: [name] [name]  Procedure(s) Performed: Procedure(s): TOTAL KNEE ARTHROPLASTY (Left)  Patient Location: PACU  Anesthesia Type:Spinal  Level of Consciousness: awake, alert  and oriented  Airway & Oxygen Therapy: Patient Spontanous Breathing and Patient connected to nasal cannula oxygen  Post-op Assessment: Report given to RN and Post -op Vital signs reviewed and stable  Post vital signs: Reviewed and stable  Last Vitals:  Filed Vitals:   09/01/15 0609 09/01/15 0917  BP: 160/81 123/78  Pulse: 71 95  Temp: 36.4 C 36.4 C  Resp: 20 10    Complications: No apparent anesthesia complications

## 2015-09-02 ENCOUNTER — Encounter (HOSPITAL_COMMUNITY): Payer: Self-pay | Admitting: Physician Assistant

## 2015-09-02 DIAGNOSIS — R112 Nausea with vomiting, unspecified: Secondary | ICD-10-CM

## 2015-09-02 DIAGNOSIS — Z9889 Other specified postprocedural states: Secondary | ICD-10-CM

## 2015-09-02 HISTORY — DX: Other specified postprocedural states: Z98.890

## 2015-09-02 HISTORY — DX: Other specified postprocedural states: R11.2

## 2015-09-02 LAB — BASIC METABOLIC PANEL
Anion gap: 8 (ref 5–15)
BUN: 9 mg/dL (ref 6–20)
CALCIUM: 9.1 mg/dL (ref 8.9–10.3)
CO2: 25 mmol/L (ref 22–32)
CREATININE: 0.67 mg/dL (ref 0.44–1.00)
Chloride: 104 mmol/L (ref 101–111)
GFR calc Af Amer: >60 mL/min (ref >60)
Glucose, Bld: 185 mg/dL — ABNORMAL HIGH (ref 65–99)
POTASSIUM: 4.2 mmol/L (ref 3.5–5.1)
SODIUM: 137 mmol/L (ref 135–145)

## 2015-09-02 LAB — CBC
HEMATOCRIT: 34 % — AB (ref 36.0–46.0)
Hemoglobin: 11.6 g/dL — ABNORMAL LOW (ref 12.0–15.0)
MCH: 28.4 pg (ref 26.0–34.0)
MCHC: 34.1 g/dL (ref 30.0–36.0)
MCV: 83.3 fL (ref 78.0–100.0)
PLATELETS: 233 10*3/uL (ref 150–400)
RBC: 4.08 MIL/uL (ref 3.87–5.11)
RDW: 15.1 % (ref 11.5–15.5)
WBC: 14.5 10*3/uL — ABNORMAL HIGH (ref 4.0–10.5)

## 2015-09-02 MED ORDER — MAGNESIUM OXIDE 400 (241.3 MG) MG PO TABS
200.0000 mg | ORAL_TABLET | Freq: Every day | ORAL | Status: DC
  Administered 2015-09-02: 11:00:00 via ORAL
  Administered 2015-09-03: 200 mg via ORAL
  Filled 2015-09-02 (×2): qty 1

## 2015-09-02 MED ORDER — OXYCODONE HCL ER 10 MG PO T12A
20.0000 mg | EXTENDED_RELEASE_TABLET | Freq: Two times a day (BID) | ORAL | Status: DC
  Administered 2015-09-02 – 2015-09-03 (×3): 20 mg via ORAL
  Filled 2015-09-02 (×3): qty 2

## 2015-09-02 MED ORDER — DEXAMETHASONE SODIUM PHOSPHATE 10 MG/ML IJ SOLN
10.0000 mg | Freq: Three times a day (TID) | INTRAMUSCULAR | Status: AC
  Administered 2015-09-02 – 2015-09-03 (×3): 10 mg via INTRAVENOUS
  Filled 2015-09-02 (×3): qty 1

## 2015-09-02 MED ORDER — DEXAMETHASONE SODIUM PHOSPHATE 10 MG/ML IJ SOLN: 10.0000 mg | Freq: Four times a day (QID) | INTRAMUSCULAR | Status: DC

## 2015-09-02 NOTE — Progress Notes (Signed)
Physical Therapy Treatment Patient Details Name: [name] [name] MRN: [id number] DOB: [birth year] Today's Date: 18-Sep-2015    History of Present Illness Pt admitted for L TKA PMHx: IBS, HTN, GERD    PT Comments    [name] [name] continues to move well with ability to perform HEP with increased reps and increased gait distance. Educated for plan and progression. Will continue to follow.   Follow Up Recommendations  Home health PT     Equipment Recommendations       Recommendations for Other Services       Precautions / Restrictions Precautions Precautions: Knee Restrictions LLE Weight Bearing: Weight bearing as tolerated    Mobility  Bed Mobility Overal bed mobility: Modified Independent             General bed mobility comments: HOB flat and min use of rail   Transfers Overall transfer level: Needs assistance   Transfers: Sit to/from Stand Sit to Stand: Supervision         General transfer comment: cues for hand placement  Ambulation/Gait Ambulation/Gait assistance: Supervision Ambulation Distance (Feet): 75 Feet Assistive device: Rolling walker (2 wheeled) Gait Pattern/deviations: Step-through pattern;Decreased stride length;Decreased stance time - left   Gait velocity interpretation: Below normal speed for age/gender General Gait Details: cues for looking up and position in RW   Stairs            Wheelchair Mobility    Modified Rankin (Stroke Patients Only)       Balance                                    Cognition Arousal/Alertness: Awake/alert Behavior During Therapy: WFL for tasks assessed/performed Overall Cognitive Status: Within Functional Limits for tasks assessed                      Exercises Total Joint Exercises Heel Slides: AROM;Left;15 reps;Supine Hip ABduction/ADduction: AROM;Seated;Left;15 reps Straight Leg Raises: AROM;Left;15 reps;Supine Long Arc Quad: AROM;Seated;Left;15 reps Goniometric  ROM: 15-60    General Comments        Pertinent Vitals/Pain Pain Score: 5  Pain Location: left knee Pain Descriptors / Indicators: Aching;Sore Pain Intervention(s): Limited activity within patient's tolerance;Monitored during session;Premedicated before session;Repositioned;Ice applied    Home Living                      Prior Function            PT Goals (current goals can now be found in the care plan section) Progress towards PT goals: Progressing toward goals    Frequency       PT Plan Current plan remains appropriate    Co-evaluation             End of Session   Activity Tolerance: Patient tolerated treatment well Patient left: in chair;with call bell/phone within reach;Other (comment) (in bone foam)     Time: [id number] PT Time Calculation (min) (ACUTE ONLY): 31 min  Charges:  $Gait Training: 8-22 mins $Therapeutic Exercise: 8-22 mins                    G Codes:      Melford Aase 2015/09/18, 7:49 AM Elwyn Reach, [place]

## 2015-09-02 NOTE — Progress Notes (Signed)
Subjective: 1 Day Post-Op Procedure(s) (LRB): TOTAL KNEE ARTHROPLASTY (Left) Patient reports pain as 7 on 0-10 scale.   Patient needed IV pain meds to control pain.  Significant nausea and vomiting with motion Objective: Vital signs in last 24 hours: Temp:  [97.3 F (36.3 C)-98.1 F (36.7 C)] 98.1 F (36.7 C) (11/29 0700) Pulse Rate:  [68-110] 110 (11/29 0145) Resp:  [12-19] 16 (11/29 0145) BP: (124-145)/(72-90) 129/72 mmHg (11/29 0700) SpO2:  [94 %-99 %] 98 % (11/29 0700)  Intake/Output from previous day: 11/28 0701 - 11/29 0700 In: 4045 [P.O.:600; I.V.:3345; IV Piggyback:100] Out: 3200 [Urine:3150; Blood:50] Intake/Output this shift:     Recent Labs  09/02/15 0442  HGB 11.6*    Recent Labs  09/02/15 0442  WBC 14.5*  RBC 4.08  HCT 34.0*  PLT 233    Recent Labs  09/02/15 0442  NA 137  K 4.2  CL 104  CO2 25  BUN 9  CREATININE 0.67  GLUCOSE 185*  CALCIUM 9.1   No results for input(s): LABPT, INR in the last 72 hours.  ABD soft Neurovascular intact Sensation intact distally Intact pulses distally Dorsiflexion/Plantar flexion intact Incision: dressing C/D/I  Assessment/Plan: 1 Day Post-Op Procedure(s) (LRB): TOTAL KNEE ARTHROPLASTY (Left)  Principal Problem:   Primary localized osteoarthritis of left knee Active Problems:   Vitamin D deficiency   Hyperlipidemia   Anxiety state   BRONCHITIS   Bronchitis   Borderline hypertension   Hypercholesterolemia   IBS (irritable bowel syndrome)   DJD (degenerative joint disease) of knee   Postoperative nausea and vomiting  Advance diet Up with therapy Plan for discharge tomorrow  Will add Decadron for nausea vomiting Will add Oxycontin for pain control Not being discharged today due to needing IV pain meds for pain control and significant post operative nausea and vomitting   Jaquia Benedicto J 09/02/2015, 9:28 AM

## 2015-09-02 NOTE — Progress Notes (Signed)
Physical Therapy Treatment Patient Details Name: [name] [name] MRN: [id number] DOB: 09-03-1951 Today's Date: 09/29/15    History of Present Illness Pt admitted for L TKA PMHx: IBS, HTN, GERD    PT Comments    Pt with improved gait distance and transfers this afternoon. [name] was in CPM on arrival and returned to CPM end of session. Educated for HEp and encouraged OOB for meals. Will continue to follow.  Follow Up Recommendations  Home health PT     Equipment Recommendations       Recommendations for Other Services       Precautions / Restrictions Precautions Precautions: Knee Restrictions LLE Weight Bearing: Weight bearing as tolerated    Mobility  Bed Mobility Overal bed mobility: Modified Independent Bed Mobility: Supine to Sit;Sit to Supine     Supine to sit: Modified independent (Device/Increase time) Sit to supine: Modified independent (Device/Increase time)      Transfers Overall transfer level: Needs assistance     Sit to Stand: Supervision;Modified independent (Device/Increase time)         General transfer comment: initial stand from bed with cues for hand placement but repeated trials at [facility] and to bed mod I   Ambulation/Gait Ambulation/Gait assistance: Supervision Ambulation Distance (Feet): 170 Feet Assistive device: Rolling walker (2 wheeled) Gait Pattern/deviations: Step-through pattern;Decreased stride length   Gait velocity interpretation: Below normal speed for age/gender General Gait Details: cues for looking up    Stairs            Wheelchair Mobility    Modified Rankin (Stroke Patients Only)       Balance                                    Cognition Arousal/Alertness: Awake/alert Behavior During Therapy: WFL for tasks assessed/performed Overall Cognitive Status: Within Functional Limits for tasks assessed                      Exercises Total Joint Exercises Heel Slides:  AROM;Left;15 reps;Supine Hip ABduction/ADduction: AROM;Left;15 reps;Supine Straight Leg Raises: AROM;Left;15 reps;Supine Long Arc Quad: AROM;Seated;Left;15 reps    General Comments        Pertinent Vitals/Pain Pain Score: 4  Pain Location: left knee Pain Descriptors / Indicators: Sore Pain Intervention(s): Repositioned;Limited activity within patient's tolerance;Monitored during session    Home Living                      Prior Function            PT Goals (current goals can now be found in the care plan section) Progress towards PT goals: Progressing toward goals    Frequency       PT Plan Current plan remains appropriate    Co-evaluation             End of Session   Activity Tolerance: Patient tolerated treatment well Patient left: in bed;with call bell/phone within reach;with family/visitor present;in CPM     Time: [id number] PT Time Calculation (min) (ACUTE ONLY): 30 min  Charges:  $Gait Training: 8-22 mins $Therapeutic Exercise: 8-22 mins                    G Codes:      Melford Aase Sep 29, 2015, 11:56 AM Elwyn Reach, [place]

## 2015-09-02 NOTE — Evaluation (Signed)
Occupational Therapy Evaluation Patient Details Name: [name] [name] MRN: [id number] DOB: [birth year] Today's Date: 09/02/2015    History of Present Illness Pt admitted for L TKA PMHx: IBS, HTN, GERD   Clinical Impression   Pt reports she was independent with ADLs PTA. Currently pt is overall supervision for ADLs and mobility. All education completed; pt with no further questions or concerns for OT at this time. Pt planning to d/c home with 24/7 supervision from her husband. Pt ready to d/c from an OT standpoint; signing off at this time. Thank you for this referral.     Follow Up Recommendations  No OT follow up;Supervision - Intermittent    Equipment Recommendations  None recommended by OT    Recommendations for Other Services       Precautions / Restrictions Precautions Precautions: Knee Restrictions Weight Bearing Restrictions: Yes LLE Weight Bearing: Weight bearing as tolerated      Mobility Bed Mobility Overal bed mobility: Modified Independent Bed Mobility: Supine to Sit;Sit to Supine     Supine to sit: Modified independent (Device/Increase time) Sit to supine: Modified independent (Device/Increase time)      Transfers Overall transfer level: Needs assistance Equipment used: Rolling walker (2 wheeled) Transfers: Sit to/from Stand Sit to Stand: Supervision         General transfer comment: Supervision for safety. Good hand placement and technique. Sit <> stand from EOB x 1, toilet x 1    Balance Overall balance assessment: Needs assistance         Standing balance support: No upper extremity supported;During functional activity Standing balance-Leahy Scale: Fair Standing balance comment: Able to stand at sink to wash hands without UE support                            ADL Overall ADL's : Needs assistance/impaired Eating/Feeding: Set up;Sitting   Grooming: Supervision/safety;Wash/dry hands;Standing       Lower Body Bathing:  Administrator, Civil Service;Sit to/from stand       Lower Body Dressing: Supervision/safety;Sit to/from stand Lower Body Dressing Details (indicate cue type and reason): Educated pt on compensatory strategies for LB ADLs; pt verbalized understanding Toilet Transfer: Supervision/safety;Ambulation;BSC;RW (BSC over toilet)   Toileting- Clothing Manipulation and Hygiene: Supervision/safety;Sit to/from stand Toileting - Clothing Manipulation Details (indicate cue type and reason): for toilet hygiene Tub/ Shower Transfer: Supervision/safety;Ambulation;Shower Technical sales engineer Details (indicate cue type and reason): Educated on walk in shower transfer technique; pt able to return demonstrate understanding.  Functional mobility during ADLs: Supervision/safety;Rolling walker General ADL Comments: No family present for OT eval. Educated on home safety, need for supervision during ADLs and mobility, edema management techniques, use of zero degree; pt verbalized understanding.     Vision     Perception     Praxis      Pertinent Vitals/Pain Pain Assessment: Faces Pain Score: 4  Faces Pain Scale: Hurts little more Pain Location: L knee Pain Descriptors / Indicators: Sore Pain Intervention(s): Limited activity within patient's tolerance;Monitored during session;Repositioned;Ice applied;RN gave pain meds during session     Hand Dominance     Extremity/Trunk Assessment Upper Extremity Assessment Upper Extremity Assessment: Overall WFL for tasks assessed   Lower Extremity Assessment Lower Extremity Assessment: Defer to PT evaluation   Cervical / Trunk Assessment Cervical / Trunk Assessment: Normal   Communication Communication Communication: No difficulties   Cognition Arousal/Alertness: Awake/alert Behavior During Therapy: WFL for tasks assessed/performed Overall Cognitive Status: Within Functional Limits  for tasks assessed                     General Comments        Exercises       Shoulder Instructions      Home Living Family/patient expects to be discharged to:: Private residence Living Arrangements: Spouse/significant other Available Help at Discharge: Family;Available 24 hours/day Type of Home: House Home Access: Level entry     Home Layout: Two level;Able to live on main level with bedroom/bathroom     Bathroom Shower/Tub: Occupational psychologist: Standard Bathroom Accessibility: Yes How Accessible: Accessible via walker Home Equipment: Shower seat;Bedside commode;Walker - 2 wheels;Cane - single point          Prior Functioning/Environment Level of Independence: Independent             OT Diagnosis: Acute pain   OT Problem List:     OT Treatment/Interventions:      OT Goals(Current goals can be found in the care plan section) Acute Rehab OT Goals Patient Stated Goal: return home OT Goal Formulation: With patient  OT Frequency:     Barriers to D/C:            Co-evaluation              End of Session Equipment Utilized During Treatment: Gait belt;Rolling walker CPM Left Knee CPM Left Knee: Off Left Knee Flexion (Degrees): 90 Left Knee Extension (Degrees): 0  Activity Tolerance: Patient tolerated treatment well Patient left: in chair;with call bell/phone within reach;Other (comment) (zero degree bone foam applied to LLE )   Time: [id number] OT Time Calculation (min): 19 min Charges:  OT General Charges $OT Visit: 1 Procedure OT Evaluation $Initial OT Evaluation Tier I: 1 Procedure G-Codes:     Binnie Kand M.S., OTR/L Pager: [phone]  09/02/2015, 3:09 PM

## 2015-09-03 LAB — CBC
HCT: 32.3 % — ABNORMAL LOW (ref 36.0–46.0)
HEMOGLOBIN: 10.4 g/dL — AB (ref 12.0–15.0)
MCH: 27.6 pg (ref 26.0–34.0)
MCHC: 32.2 g/dL (ref 30.0–36.0)
MCV: 85.7 fL (ref 78.0–100.0)
Platelets: 233 10*3/uL (ref 150–400)
RBC: 3.77 MIL/uL — ABNORMAL LOW (ref 3.87–5.11)
RDW: 15.8 % — ABNORMAL HIGH (ref 11.5–15.5)
WBC: 15.6 10*3/uL — ABNORMAL HIGH (ref 4.0–10.5)

## 2015-09-03 LAB — BASIC METABOLIC PANEL
Anion gap: 5 (ref 5–15)
BUN: 11 mg/dL (ref 6–20)
CHLORIDE: 106 mmol/L (ref 101–111)
CO2: 28 mmol/L (ref 22–32)
CREATININE: 0.62 mg/dL (ref 0.44–1.00)
Calcium: 9.1 mg/dL (ref 8.9–10.3)
GFR calc Af Amer: >60 mL/min (ref >60)
GFR calc non Af Amer: >60 mL/min (ref >60)
GLUCOSE: 155 mg/dL — AB (ref 65–99)
Potassium: 4.5 mmol/L (ref 3.5–5.1)
SODIUM: 139 mmol/L (ref 135–145)

## 2015-09-03 MED ORDER — APIXABAN 2.5 MG PO TABS: ORAL_TABLET | ORAL | Status: DC

## 2015-09-03 MED ORDER — DOCUSATE SODIUM 100 MG PO CAPS: ORAL_CAPSULE | ORAL | Status: DC

## 2015-09-03 MED ORDER — OXYCODONE HCL ER 20 MG PO T12A: EXTENDED_RELEASE_TABLET | ORAL | Status: DC

## 2015-09-03 MED ORDER — OXYCODONE HCL 5 MG PO TABS: ORAL_TABLET | ORAL | Status: DC

## 2015-09-03 MED ORDER — POLYETHYLENE GLYCOL 3350 17 G PO PACK: PACK | ORAL | Status: DC

## 2015-09-03 NOTE — Care Management Note (Signed)
Case Management Note  Patient Details  Name: [name] [name] MRN: [id number] Date of Birth: 08/13/1951  Subjective/Objective:            S/p left total knee arthroplasty        Action/Plan: Set up with [facility] for HHPT by MD office. Spoke with patient, no change in discharge plan. Patient stated that she has a rolling walker and 3N1 at home, [employer] provided CPM. Patent stated that here husband will be available to assist her after discharge.     Expected Discharge Date:                  Expected Discharge Plan:  [place]  In-House Referral:  NA  Discharge planning Services  CM Consult  Post Acute Care Choice:  Home Health, Durable Medical Equipment Choice offered to:  Patient  DME Arranged:  CPM DME Agency:  [employer]  HH Arranged:  PT HH Agency:  [place]  Status of Service:  Completed, signed off  Medicare Important Message Given:    Date Medicare IM Given:    Medicare IM give by:    Date Additional Medicare IM Given:    Additional Medicare Important Message give by:     If discussed at [place] of Stay Meetings, dates discussed:    Additional Comments:  Nila Nephew, RN 09/03/2015, 8:17 AM

## 2015-09-03 NOTE — Discharge Instructions (Signed)
Information on my medicine - ELIQUIS® (apixaban) ° °This medication education was reviewed with me or my healthcare representative as part of my discharge preparation.  The pharmacist that spoke with me during my hospital stay was:  Niel Peretti T, RPH ° °Why was Eliquis® prescribed for you? °Eliquis® was prescribed for you to reduce the risk of blood clots forming after orthopedic surgery.   ° °What do You need to know about Eliquis®? °Take your Eliquis® TWICE DAILY - one tablet in the morning and one tablet in the evening with or without food.  It would be best to take the dose about the same time each day. ° °If you have difficulty swallowing the tablet whole please discuss with your pharmacist how to take the medication safely. ° °Take Eliquis® exactly as prescribed by your doctor and DO NOT stop taking Eliquis® without talking to the doctor who prescribed the medication.  Stopping without other medication to take the place of Eliquis® may increase your risk of developing a clot. ° °After discharge, you should have regular check-up appointments with your healthcare provider that is prescribing your Eliquis®. ° °What do you do if you miss a dose? °If a dose of ELIQUIS® is not taken at the scheduled time, take it as soon as possible on the same day and twice-daily administration should be resumed.  The dose should not be doubled to make up for a missed dose.  Do not take more than one tablet of ELIQUIS at the same time. ° °Important Safety Information °A possible side effect of Eliquis® is bleeding. You should call your healthcare provider right away if you experience any of the following: °? Bleeding from an injury or your nose that does not stop. °? Unusual colored urine (red or dark brown) or unusual colored stools (red or black). °? Unusual bruising for unknown reasons. °? A serious fall or if you hit your head (even if there is no bleeding). ° °Some medicines may interact with Eliquis® and might increase  your risk of bleeding or clotting while on Eliquis®. To help avoid this, consult your healthcare provider or pharmacist prior to using any new prescription or non-prescription medications, including herbals, vitamins, non-steroidal anti-inflammatory drugs (NSAIDs) and supplements. ° °This website has more information on Eliquis® (apixaban): http://www.eliquis.com/eliquis/home °

## 2015-09-03 NOTE — Discharge Summary (Signed)
Patient ID: [name] [name] MRN: [id number] DOB/AGE: [birth year] 64 y.o.  Admit date: 09/01/2015 Discharge date: 09/03/2015  Admission Diagnoses:  Principal Problem:   Primary localized osteoarthritis of left knee Active Problems:   Vitamin D deficiency   Hyperlipidemia   Anxiety state   BRONCHITIS   Bronchitis   Borderline hypertension   Hypercholesterolemia   IBS (irritable bowel syndrome)   DJD (degenerative joint disease) of knee   Postoperative nausea and vomiting   Discharge Diagnoses:  Same  Past Medical History  Diagnosis Date  . Allergy, unspecified not elsewhere classified   . Bronchitis   . Borderline hypertension   . Hypercholesterolemia   . GERD (gastroesophageal reflux disease)   . IBS (irritable bowel syndrome)   . DJD (degenerative joint disease)   . Vitamin D deficiency   . History of skin cancer   . Primary localized osteoarthritis of left knee   . PONV (postoperative nausea and vomiting)   . Hypothyroidism   . Skin cancer (melanoma) (HCC)     basil cell   . Postoperative nausea and vomiting 09/02/2015    Surgeries: Procedure(s): TOTAL KNEE ARTHROPLASTY on 09/01/2015   Consultants:    Discharged Condition: Improved  Hospital Course: [name] [name] is an 64 y.o. female who was admitted 09/01/2015 for operative treatment ofPrimary localized osteoarthritis of left knee. Patient has severe unremitting pain that affects sleep, daily activities, and work/hobbies. After pre-op clearance the patient was taken to the operating room on 09/01/2015 and underwent  Procedure(s): TOTAL KNEE ARTHROPLASTY.    Patient was given perioperative antibiotics: Anti-infectives    Start     Dose/Rate Route Frequency Ordered Stop   09/01/15 1300  ceFAZolin (ANCEF) IVPB 2 g/50 mL premix     2 g 100 mL/hr over 30 Minutes Intravenous Every 6 hours 09/01/15 1059 09/01/15 2103   09/01/15 1200  doxycycline (VIBRAMYCIN) 50 MG capsule 50 mg     50 mg Oral Daily 09/01/15  1059     09/01/15 0700  ceFAZolin (ANCEF) IVPB 2 g/50 mL premix     2 g 100 mL/hr over 30 Minutes Intravenous To ShortStay Surgical 08/29/15 1242 09/01/15 0710       Patient was given sequential compression devices, early ambulation, and chemoprophylaxis to prevent DVT.  Patient benefited maximally from hospital stay and there were no complications.    Recent vital signs: Patient Vitals for the past 24 hrs:  BP Temp Temp src Pulse Resp SpO2  09/03/15 1044 - - - - - 99 %  09/03/15 0617 122/87 mmHg 98.3 F (36.8 C) Oral (!) 120 20 98 %  09/02/15 2124 (!) 150/81 mmHg 97.7 F (36.5 C) Oral (!) 130 18 95 %  09/02/15 1514 (!) 144/83 mmHg 99 F (37.2 C) - (!) 122 16 97 %     Recent laboratory studies:  Recent Labs  09/02/15 0442 09/03/15 0445  WBC 14.5* 15.6*  HGB 11.6* 10.4*  HCT 34.0* 32.3*  PLT 233 233  NA 137 139  K 4.2 4.5  CL 104 106  CO2 25 28  BUN 9 11  CREATININE 0.67 0.62  GLUCOSE 185* 155*  CALCIUM 9.1 9.1     Discharge Medications:     Medication List    STOP taking these medications        aspirin EC 81 MG tablet     dextromethorphan-guaiFENesin 30-600 MG 12hr tablet  Commonly known as:  [place] DM      TAKE these medications  apixaban 2.5 MG Tabs tablet  Commonly known as:  ELIQUIS  1 tablet twice a day for 2 weeks to prevent blood clots     budesonide-formoterol 160-4.5 MCG/ACT inhaler  Commonly known as:  SYMBICORT  Inhale 2 puffs into the lungs 2 (two) times daily.     docusate sodium 100 MG capsule  Commonly known as:  COLACE  1 tab 2 times a day while on narcotics.  STOOL SOFTENER     DORYX 50 MG Tbec  Generic drug:  Doxycycline Hyclate  Take 1 tablet by mouth daily.     fluticasone 50 MCG/ACT nasal spray  Commonly known as:  FLONASE  Place 2 sprays into both nostrils daily.     MAGNESIUM GLYCINATE PLUS 110 MG Caps  Generic drug:  Magnesium  Take 200 mg by mouth daily.     MULTIVITAMIN PO  Take 1 tablet by mouth daily.      omeprazole 20 MG capsule  Commonly known as:  PRILOSEC  TAKE ONE CAPSULE BY MOUTH DAILY     oxyCODONE 5 MG immediate release tablet  Commonly known as:  Oxy IR/ROXICODONE  1-2 tablets every 4-6 hrs as needed for pain SHORT ACTING PAIN MEDICATION     oxyCODONE 20 mg 12 hr tablet  Commonly known as:  OXYCONTIN  1 tablet every 12 hours    LONG ACTING PAIN MEDICATION     polyethylene glycol packet  Commonly known as:  MIRALAX / GLYCOLAX  17grams in 16 oz of water twice a day until bowel movement.  LAXITIVE.  Restart if two days since last bowel movement     simvastatin 20 MG tablet  Commonly known as:  ZOCOR  TAKE 1 TABLET BY MOUTH EVERY NIGHT AT BEDTIME     thyroid 90 MG tablet  Commonly known as:  ARMOUR  Take 90 mg by mouth daily.     Vitamin D3 5000 UNITS Caps  Take 1 capsule by mouth daily.        Diagnostic Studies: Dg Chest 2 View  08/21/2015  CLINICAL DATA:  Preop knee replacement. EXAM: CHEST  2 VIEW COMPARISON:  None. FINDINGS: The heart size and mediastinal contours are within normal limits. Both lungs are clear. The visualized skeletal structures are unremarkable. IMPRESSION: No active cardiopulmonary disease. Electronically Signed   By: Kathreen Devoid   On: 08/21/2015 16:34    Disposition: Final discharge disposition not confirmed      Discharge Instructions    CPM    Complete by:  As directed   Continuous passive motion machine (CPM):      Use the CPM from 0 to 90 for 6 hours per day.       You may break it up into 2 or 3 sessions per day.      Use CPM for 2 weeks or until you are told to stop.     Call MD / Call 911    Complete by:  As directed   If you experience chest pain or shortness of breath, CALL 911 and be transported to the hospital emergency room.  If you develope a fever above 101 F, pus (white drainage) or increased drainage or redness at the wound, or calf pain, call your surgeon's office.     Change dressing    Complete by:  As directed    Change the gauze dressing daily with sterile 4 x 4 inch gauze and apply TED hose.  DO NOT REMOVE BANDAGE OVER SURGICAL INCISION.  [place] WHOLE LEG INCLUDING OVER THE WATERPROOF BANDAGE WITH SOAP AND WATER EVERY DAY.     Constipation Prevention    Complete by:  As directed   Drink plenty of fluids.  Prune juice may be helpful.  You may use a stool softener, such as Colace (over the counter) 100 mg twice a day.  Use MiraLax (over the counter) for constipation as needed.     Diet - low sodium heart healthy    Complete by:  As directed      Discharge instructions    Complete by:  As directed   INSTRUCTIONS AFTER JOINT REPLACEMENT   Remove items at home which could result in a fall. This includes throw rugs or furniture in walking pathways ICE to the affected joint every three hours while awake for 30 minutes at a time, for at least the first 3-5 days, and then as needed for pain and swelling.  Continue to use ice for pain and swelling. You may notice swelling that will progress down to the foot and ankle.  This is normal after surgery.  Elevate your leg when you are not up walking on it.   Continue to use the breathing machine you got in the hospital (incentive spirometer) which will help keep your temperature down.  It is common for your temperature to cycle up and down following surgery, especially at night when you are not up moving around and exerting yourself.  The breathing machine keeps your lungs expanded and your temperature down.   DIET:  As you were doing prior to hospitalization, we recommend a well-balanced diet.  DRESSING / WOUND CARE / SHOWERING  Keep the surgical dressing until follow up.  The dressing is water proof, so you can shower without any extra covering.  IF THE DRESSING FALLS OFF or the wound gets wet inside, change the dressing with sterile gauze.  Please use good hand washing techniques before changing the dressing.  Do not use any lotions or creams on the incision until  instructed by your surgeon.    ACTIVITY  Increase activity slowly as tolerated, but follow the weight bearing instructions below.   No driving for 6 weeks or until further direction given by your physician.  You cannot drive while taking narcotics.  No lifting or carrying greater than 10 lbs. until further directed by your surgeon. Avoid periods of inactivity such as sitting longer than an hour when not asleep. This helps prevent blood clots.  You may return to work once you are authorized by your doctor.     WEIGHT BEARING   Weight bearing as tolerated with assist device (walker, cane, etc) as directed, use it as long as suggested by your surgeon or therapist, typically at least 1-2 weeks.   EXERCISES  Results after joint replacement surgery are often greatly improved when you follow the exercise, range of motion and muscle strengthening exercises prescribed by your doctor. Safety measures are also important to protect the joint from further injury. Any time any of these exercises cause you to have increased pain or swelling, decrease what you are doing until you are comfortable again and then slowly increase them. If you have problems or questions, call your caregiver or physical therapist for advice.   Rehabilitation is important following a joint replacement. After just a few days of immobilization, the muscles of the leg can become weakened and shrink (atrophy).  These exercises are designed to build up the tone and strength of the thigh and  leg muscles and to improve motion. Often times heat used for twenty to thirty minutes before working out will loosen up your tissues and help with improving the range of motion but do not use heat for the first two weeks following surgery (sometimes heat can increase post-operative swelling).   These exercises can be done on a training (exercise) mat, on the floor, on a table or on a bed. Use whatever works the best and is most comfortable for you.     Use music or television while you are exercising so that the exercises are a pleasant break in your day. This will make your life better with the exercises acting as a break in your routine that you can look forward to.   Perform all exercises about fifteen times, three times per day or as directed.  You should exercise both the operative leg and the other leg as well.   Exercises include:   Quad Sets - Tighten up the muscle on the front of the thigh (Quad) and hold for 5-10 seconds.   Straight Leg Raises - With your knee straight (if you were given a brace, keep it on), lift the leg to 60 degrees, hold for 3 seconds, and slowly lower the leg.  Perform this exercise against resistance later as your leg gets stronger.  Leg Slides: Lying on your back, slowly slide your foot toward your buttocks, bending your knee up off the floor (only go as far as is comfortable). Then slowly slide your foot back down until your leg is flat on the floor again.  Angel Wings: Lying on your back spread your legs to the side as far apart as you can without causing discomfort.  Hamstring Strength:  Lying on your back, push your heel against the floor with your leg straight by tightening up the muscles of your buttocks.  Repeat, but this time bend your knee to a comfortable angle, and push your heel against the floor.  You may put a pillow under the heel to make it more comfortable if necessary.   A rehabilitation program following joint replacement surgery can speed recovery and prevent re-injury in the future due to weakened muscles. Contact your doctor or a physical therapist for more information on knee rehabilitation.    CONSTIPATION  Constipation is defined medically as fewer than three stools per week and severe constipation as less than one stool per week.  Even if you have a regular bowel pattern at home, your normal regimen is likely to be disrupted due to multiple reasons following surgery.  Combination of  anesthesia, postoperative narcotics, change in appetite and fluid intake all can affect your bowels.   YOU MUST use at least one of the following options; they are listed in order of increasing strength to get the job done.  They are all available over the counter, and you may need to use some, POSSIBLY even all of these options:    Drink plenty of fluids (prune juice may be helpful) and high fiber foods Colace 100 mg by mouth twice a day  Senokot for constipation as directed and as needed Dulcolax (bisacodyl), take with full glass of water  Miralax (polyethylene glycol) once or twice a day as needed.  If you have tried all these things and are unable to have a bowel movement in the first 3-4 days after surgery call either your surgeon or your primary doctor.    If you experience loose stools or diarrhea, hold the medications  until you stool forms back up.  If your symptoms do not get better within 1 week or if they get worse, check with your doctor.  If you experience "the worst abdominal pain ever" or develop nausea or vomiting, please contact the office immediately for further recommendations for treatment.   ITCHING:  If you experience itching with your medications, try taking only a single pain pill, or even half a pain pill at a time.  You can also use Benadryl over the counter for itching or also to help with sleep.   TED HOSE STOCKINGS:  Use stockings on both legs until for at least 2 weeks or as directed by physician office. They may be removed at night for sleeping.  MEDICATIONS:  See your medication summary on the "After Visit Summary" that nursing will review with you.  You may have some home medications which will be placed on hold until you complete the course of blood thinner medication.  It is important for you to complete the blood thinner medication as prescribed.  PRECAUTIONS:  If you experience chest pain or shortness of breath - call 911 immediately for transfer to the  hospital emergency department.   If you develop a fever greater that 101 F, purulent drainage from wound, increased redness or drainage from wound, foul odor from the wound/dressing, or calf pain - CONTACT YOUR SURGEON.                                                   FOLLOW-UP APPOINTMENTS:  If you do not already have a post-op appointment, please call the office for an appointment to be seen by your surgeon.  Guidelines for how soon to be seen are listed in your "After Visit Summary", but are typically between 1-4 weeks after surgery.  OTHER INSTRUCTIONS:   Knee Replacement:  Do not place pillow under knee, focus on keeping the knee straight while resting. CPM instructions: 0-90 degrees, 2 hours in the morning, 2 hours in the afternoon, and 2 hours in the evening. Place foam block, curve side up under heel at all times except when in CPM or when walking.  DO NOT modify, tear, cut, or change the foam block in any way.  MAKE SURE YOU:  Understand these instructions.  Get help right away if you are not doing well or get worse.    Thank you for letting us be a part of your medical care team.  It is a privilege we respect greatly.  We hope these instructions will help you stay on track for a fast and full recovery!     Do not put a pillow under the knee. Place it under the heel.    Complete by:  As directed   Place gray foam block, curve side up under heel at all times except when in CPM or when walking.  DO NOT modify, tear, cut, or change in any way the gray foam block.     Increase activity slowly as tolerated    Complete by:  As directed      TED hose    Complete by:  As directed   Use stockings (TED hose) for 2 weeks on both leg(s).  You may remove them at night for sleeping.           Follow-up Information  Follow up with [facility].   Why:  They will contact you to schedule home therapy visits.   Contact information:   [address] [place]  [address] [phone]       Follow up with Lorn Junes, MD On 09/15/2015.   Specialty:  Orthopedic Surgery   Why:  APPT TIME 11 AM   Contact information:   [place] [address] [phone]        Signed: Linda Hedges 09/03/2015, 11:30 AM

## 2015-09-03 NOTE — Progress Notes (Signed)
Physical Therapy Treatment Patient Details Name: [name] [name] MRN: [id number] DOB: [birth year] Today's Date: 09/03/2015    History of Present Illness Pt admitted for L TKA PMHx: IBS, HTN, GERD    PT Comments    Pt progressing well, had some dizziness with initial ambulation today but seemed to improve as she was up longer. Ambulating mod I 200' with RW. Practiced stairs as she has them to get to basement. Supervision with rail.    Follow Up Recommendations  Home health PT     Equipment Recommendations  None recommended by PT    Recommendations for Other Services       Precautions / Restrictions Precautions Precautions: Knee Restrictions Weight Bearing Restrictions: Yes LLE Weight Bearing: Weight bearing as tolerated    Mobility  Bed Mobility Overal bed mobility: Modified Independent Bed Mobility: Sit to Supine       Sit to supine: Modified independent (Device/Increase time)   General bed mobility comments: HOB flat and min use of rail, able to position self in CPM  Transfers Overall transfer level: Needs assistance Equipment used: Rolling walker (2 wheeled) Transfers: Sit to/from Stand Sit to Stand: Modified independent (Device/Increase time)         General transfer comment: independent from bed, chair, and toilet. Discussed keeping left foot back at chair before sitting for increased stretch  Ambulation/Gait Ambulation/Gait assistance: Supervision Ambulation Distance (Feet): 200 Feet Assistive device: Rolling walker (2 wheeled) Gait Pattern/deviations: Decreased stance time - left;Decreased step length - left;Step-through pattern   Gait velocity interpretation: Below normal speed for age/gender General Gait Details: supervision for safety as pt slightly dizzy with initial ambulation, improved with time up. Worked on left knee flexion in swing through and terminal extension at heel strike   Stairs Stairs: Yes Stairs assistance: Supervision Stair  Management: One rail Left;Step to pattern;Forwards Number of Stairs: 10 General stair comments: pt safe on stairs with rail  Wheelchair Mobility    Modified Rankin (Stroke Patients Only)       Balance Overall balance assessment: Modified Independent         Standing balance support: No upper extremity supported Standing balance-Leahy Scale: Good                      Cognition Arousal/Alertness: Awake/alert Behavior During Therapy: WFL for tasks assessed/performed Overall Cognitive Status: Within Functional Limits for tasks assessed                      Exercises Total Joint Exercises Ankle Circles/Pumps: AROM;Seated;Both;20 reps Quad Sets: AROM;Seated;Left;10 reps Gluteal Sets: AROM;10 reps;Seated Heel Slides: AROM;Left;15 reps;Seated Hip ABduction/ADduction: AROM;Left;15 reps;Supine Straight Leg Raises: AROM;Left;15 reps;Supine Long Arc Quad: AROM;Seated;Left;15 reps Goniometric ROM: 5-90    General Comments General comments (skin integrity, edema, etc.): pt requested to get in CPM before home today, set 0-90      Pertinent Vitals/Pain Pain Assessment: 0-10 Pain Score: 2  Pain Location: left knee Pain Descriptors / Indicators: Sore Pain Intervention(s): Monitored during session    Home Living                      Prior Function            PT Goals (current goals can now be found in the care plan section) Acute Rehab PT Goals Patient Stated Goal: return home PT Goal Formulation: With patient/family Time For Goal Achievement: 09/08/15 Potential to Achieve Goals: Good Progress towards PT goals:  Progressing toward goals    Frequency  7X/week    PT Plan Current plan remains appropriate    Co-evaluation             End of Session   Activity Tolerance: Patient tolerated treatment well Patient left: in bed;with call bell/phone within reach;with family/visitor present;in CPM     Time: [id number] PT Time Calculation  (min) (ACUTE ONLY): 36 min  Charges:  $Gait Training: 23-37 mins                    G Codes:     Leighton Roach, PT  Acute Rehab Services  [phone]  Leighton Roach 09/03/2015, 8:56 AM

## 2015-10-16 ENCOUNTER — Encounter: Payer: Self-pay | Admitting: Family Medicine

## 2015-10-16 DIAGNOSIS — Z1283 Encounter for screening for malignant neoplasm of skin: Secondary | ICD-10-CM

## 2015-10-28 ENCOUNTER — Encounter: Payer: Self-pay | Admitting: Family Medicine

## 2015-12-03 ENCOUNTER — Ambulatory Visit (INDEPENDENT_AMBULATORY_CARE_PROVIDER_SITE_OTHER): Payer: BLUE CROSS/BLUE SHIELD | Admitting: Family Medicine

## 2015-12-03 ENCOUNTER — Encounter: Payer: Self-pay | Admitting: Family Medicine

## 2015-12-03 ENCOUNTER — Encounter: Payer: Self-pay | Admitting: General Practice

## 2015-12-03 VITALS — BP 126/74 | HR 80 | Temp 98.1°F | Resp 16 | Ht 66.0 in | Wt 197.2 lb

## 2015-12-03 DIAGNOSIS — E785 Hyperlipidemia, unspecified: Secondary | ICD-10-CM

## 2015-12-03 DIAGNOSIS — Z23 Encounter for immunization: Secondary | ICD-10-CM | POA: Diagnosis not present

## 2015-12-03 DIAGNOSIS — E038 Other specified hypothyroidism: Secondary | ICD-10-CM | POA: Diagnosis not present

## 2015-12-03 LAB — BASIC METABOLIC PANEL
BUN: 20 mg/dL (ref 6–23)
CALCIUM: 9.6 mg/dL (ref 8.4–10.5)
CO2: 27 mEq/L (ref 19–32)
CREATININE: 0.66 mg/dL (ref 0.40–1.20)
Chloride: 102 mEq/L (ref 96–112)
GFR: 95.83 mL/min (ref >60.00)
Glucose, Bld: 103 mg/dL — ABNORMAL HIGH (ref 70–99)
POTASSIUM: 3.8 meq/L (ref 3.5–5.1)
Sodium: 137 mEq/L (ref 135–145)

## 2015-12-03 LAB — HEPATIC FUNCTION PANEL
ALBUMIN: 4.4 g/dL (ref 3.5–5.2)
ALK PHOS: 96 U/L (ref 39–117)
ALT: 22 U/L (ref 0–35)
AST: 23 U/L (ref 0–37)
Bilirubin, Direct: 0.1 mg/dL (ref 0.0–0.3)
TOTAL PROTEIN: 7.4 g/dL (ref 6.0–8.3)
Total Bilirubin: 0.5 mg/dL (ref 0.2–1.2)

## 2015-12-03 LAB — LIPID PANEL
CHOL/HDL RATIO: 2
Cholesterol: 186 mg/dL (ref 0–200)
HDL: 75.6 mg/dL (ref >39.00)
LDL CALC: 88 mg/dL (ref 0–99)
NonHDL: 110.43
TRIGLYCERIDES: 110 mg/dL (ref 0.0–149.0)
VLDL: 22 mg/dL (ref 0.0–40.0)

## 2015-12-03 LAB — TSH: TSH: 1.62 u[IU]/mL (ref 0.35–4.50)

## 2015-12-03 NOTE — Assessment & Plan Note (Signed)
Chronic problem.  Asymptomatic.  Check labs.  Adjust meds prn  

## 2015-12-03 NOTE — Addendum Note (Signed)
Addended by: Davis Gourd on: 12/03/2015 09:21 AM   Modules accepted: Orders

## 2015-12-03 NOTE — Patient Instructions (Signed)
Schedule your complete physical in 6 months We'll notify you of your lab results and make any changes Keep up the good work on healthy diet and regular exercise- you can do it!! Call with any questions or concerns If you want to join [place] at the new [place] office, any scheduled appointments will automatically transfer and we will see you at [address] [place] [address] [name] [name], [place] [address] ([place]) [place]!!!

## 2015-12-03 NOTE — Progress Notes (Signed)
   Subjective:    Patient ID: [name] [name], female    DOB: 1950-11-10, 65 y.o.   MRN: [id number]  HPI Hyperlipidemia- chronic problem, on Simvastatin.  Pt has gained 12 lbs since knee replacement surgery.  No CP, SOB, HAs, visual changes, abd pain, N/V.  Hypothyroid- chronic problem, on [place] thyroid.  Denies fatigue, changes to skin/hair/nails.   Review of Systems For ROS see HPI     Objective:   Physical Exam  Constitutional: She is oriented to person, place, and time. She appears well-developed and well-nourished. No distress.  HENT:  Head: Normocephalic and atraumatic.  Eyes: Conjunctivae and EOM are normal. Pupils are equal, round, and reactive to light.  Neck: Normal range of motion. Neck supple. No thyromegaly present.  Cardiovascular: Normal rate, regular rhythm, normal heart sounds and intact distal pulses.   No murmur heard. Pulmonary/Chest: Effort normal and breath sounds normal. No respiratory distress.  Abdominal: Soft. She exhibits no distension. There is no tenderness.  Musculoskeletal: She exhibits no edema.  Lymphadenopathy:    She has no cervical adenopathy.  Neurological: She is alert and oriented to person, place, and time.  Skin: Skin is warm and dry.  Psychiatric: She has a normal mood and affect. Her behavior is normal.  Vitals reviewed.         Assessment & Plan:

## 2015-12-03 NOTE — Assessment & Plan Note (Signed)
Chronic problem.  Tolerating statin w/o difficulty.  Check labs.  Adjust meds prn  

## 2015-12-03 NOTE — Progress Notes (Signed)
Pre visit review using our clinic review tool, if applicable. No additional management support is needed unless otherwise documented below in the visit note. 

## 2015-12-09 ENCOUNTER — Other Ambulatory Visit: Payer: Self-pay | Admitting: Family Medicine

## 2015-12-09 NOTE — Telephone Encounter (Signed)
Medication filled to pharmacy as requested.   

## 2016-01-06 ENCOUNTER — Other Ambulatory Visit: Payer: Self-pay | Admitting: Family Medicine

## 2016-01-06 NOTE — Telephone Encounter (Signed)
Medication filled to pharmacy as requested.   

## 2016-01-20 ENCOUNTER — Telehealth: Payer: Self-pay | Admitting: General Practice

## 2016-01-20 DIAGNOSIS — Z85828 Personal history of other malignant neoplasm of skin: Secondary | ICD-10-CM

## 2016-01-20 DIAGNOSIS — Z96652 Presence of left artificial knee joint: Secondary | ICD-10-CM

## 2016-01-20 NOTE — Telephone Encounter (Signed)
Received a mychart message for pt though husbands account. Wanting referrals to dermatology and ortho due to upcoming appts. Both referral placed today.

## 2016-05-17 ENCOUNTER — Encounter: Payer: Self-pay | Admitting: Family Medicine

## 2016-06-01 ENCOUNTER — Encounter: Payer: BLUE CROSS/BLUE SHIELD | Admitting: Family Medicine

## 2016-06-24 ENCOUNTER — Other Ambulatory Visit: Payer: Self-pay | Admitting: Family Medicine

## 2016-08-12 LAB — HM PAP SMEAR

## 2016-08-12 LAB — HM MAMMOGRAPHY: HM Mammogram: NORMAL (ref 0–4)

## 2016-09-17 ENCOUNTER — Encounter: Payer: BLUE CROSS/BLUE SHIELD | Admitting: Family Medicine

## 2016-10-01 ENCOUNTER — Encounter: Payer: Self-pay | Admitting: Family Medicine

## 2016-10-01 ENCOUNTER — Ambulatory Visit (INDEPENDENT_AMBULATORY_CARE_PROVIDER_SITE_OTHER): Payer: BLUE CROSS/BLUE SHIELD | Admitting: Family Medicine

## 2016-10-01 VITALS — BP 126/83 | HR 82 | Temp 97.9°F | Resp 17 | Ht 66.0 in | Wt 199.1 lb

## 2016-10-01 DIAGNOSIS — J0100 Acute maxillary sinusitis, unspecified: Secondary | ICD-10-CM

## 2016-10-01 MED ORDER — AMOXICILLIN 875 MG PO TABS: 875.0000 mg | ORAL_TABLET | Freq: Two times a day (BID) | ORAL | 0 refills | Status: DC

## 2016-10-01 MED ORDER — GUAIFENESIN-CODEINE 100-10 MG/5ML PO SYRP: 10.0000 mL | ORAL_SOLUTION | Freq: Three times a day (TID) | ORAL | 0 refills | Status: DC | PRN

## 2016-10-01 NOTE — Progress Notes (Signed)
Pre visit review using our clinic review tool, if applicable. No additional management support is needed unless otherwise documented below in the visit note. 

## 2016-10-01 NOTE — Progress Notes (Signed)
   Subjective:    Patient ID: [name] [name], female    DOB: 12-21-1950, 65 y.o.   MRN: [id number]  HPI URI- sxs started 12/16 when she was on jury duty.  + nasal congestion x1 week, then developed productive cough.  No fevers.  + sinus pain/pressure.  Some sore LNs on R side, + body aches.  No tooth pain.  No N/V.  Mild sore throat.  R ear pain and bilateral fullness.  No known sick contacts.   Review of Systems For ROS see HPI     Objective:   Physical Exam  Constitutional: She appears well-developed and well-nourished. No distress.  HENT:  Head: Normocephalic and atraumatic.  Right Ear: Tympanic membrane normal.  Left Ear: Tympanic membrane normal.  Nose: Mucosal edema and rhinorrhea present. Right sinus exhibits maxillary sinus tenderness. Right sinus exhibits no frontal sinus tenderness. Left sinus exhibits maxillary sinus tenderness. Left sinus exhibits no frontal sinus tenderness.  Mouth/Throat: Uvula is midline and mucous membranes are normal. Posterior oropharyngeal erythema present. No oropharyngeal exudate.  Eyes: Conjunctivae and EOM are normal. Pupils are equal, round, and reactive to light.  Neck: Normal range of motion. Neck supple.  Cardiovascular: Normal rate, regular rhythm and normal heart sounds.   Pulmonary/Chest: Effort normal and breath sounds normal. No respiratory distress. She has no wheezes.  Lymphadenopathy:    She has no cervical adenopathy.          Assessment & Plan:  Maxillary sinusitis- pt's sxs and PE consistent w/ infxn.  Start amox.  Cough meds prn.  Reviewed supportive care and red flags that should prompt return.  Pt expressed understanding and is in agreement w/ plan.

## 2016-10-01 NOTE — Patient Instructions (Signed)
Follow up as needed Start the Amoxicillin twice daily- take w/ food Drink plenty of fluids Use the codeine cough syrup as needed- will cause drowsiness Mucinex DM for daytime cough w/o drowsiness REST!! Call with any questions or concerns Happy New Year!!!

## 2016-11-04 ENCOUNTER — Encounter: Payer: Self-pay | Admitting: Family Medicine

## 2016-11-04 ENCOUNTER — Ambulatory Visit (INDEPENDENT_AMBULATORY_CARE_PROVIDER_SITE_OTHER): Payer: Medicare Other | Admitting: Family Medicine

## 2016-11-04 VITALS — BP 121/81 | HR 79 | Temp 98.1°F | Resp 16 | Ht 66.0 in | Wt 190.5 lb

## 2016-11-04 DIAGNOSIS — Z1159 Encounter for screening for other viral diseases: Secondary | ICD-10-CM | POA: Diagnosis not present

## 2016-11-04 DIAGNOSIS — E785 Hyperlipidemia, unspecified: Secondary | ICD-10-CM

## 2016-11-04 DIAGNOSIS — Z0000 Encounter for general adult medical examination without abnormal findings: Secondary | ICD-10-CM | POA: Diagnosis not present

## 2016-11-04 DIAGNOSIS — E038 Other specified hypothyroidism: Secondary | ICD-10-CM | POA: Diagnosis not present

## 2016-11-04 LAB — HEPATIC FUNCTION PANEL
ALBUMIN: 4.8 g/dL (ref 3.5–5.2)
ALT: 21 U/L (ref 0–35)
AST: 30 U/L (ref 0–37)
Alkaline Phosphatase: 88 U/L (ref 39–117)
Bilirubin, Direct: 0.2 mg/dL (ref 0.0–0.3)
TOTAL PROTEIN: 7.2 g/dL (ref 6.0–8.3)
Total Bilirubin: 0.8 mg/dL (ref 0.2–1.2)

## 2016-11-04 LAB — CBC WITH DIFFERENTIAL/PLATELET
BASOS ABS: 0.1 10*3/uL (ref 0.0–0.1)
Basophils Relative: 1.1 % (ref 0.0–3.0)
Eosinophils Absolute: 0.1 10*3/uL (ref 0.0–0.7)
Eosinophils Relative: 1.5 % (ref 0.0–5.0)
HCT: 43.4 % (ref 36.0–46.0)
HEMOGLOBIN: 14.8 g/dL (ref 12.0–15.0)
Lymphocytes Relative: 28.7 % (ref 12.0–46.0)
Lymphs Abs: 1.7 10*3/uL (ref 0.7–4.0)
MCHC: 34 g/dL (ref 30.0–36.0)
MCV: 89.5 fl (ref 78.0–100.0)
MONOS PCT: 8.9 % (ref 3.0–12.0)
Monocytes Absolute: 0.5 10*3/uL (ref 0.1–1.0)
NEUTROS PCT: 59.8 % (ref 43.0–77.0)
Neutro Abs: 3.4 10*3/uL (ref 1.4–7.7)
Platelets: 295 10*3/uL (ref 150.0–400.0)
RBC: 4.85 Mil/uL (ref 3.87–5.11)
RDW: 14.1 % (ref 11.5–15.5)
WBC: 5.8 10*3/uL (ref 4.0–10.5)

## 2016-11-04 LAB — LIPID PANEL
CHOL/HDL RATIO: 5
Cholesterol: 189 mg/dL (ref 0–200)
HDL: 41.8 mg/dL (ref >39.00)
LDL CALC: 135 mg/dL — AB (ref 0–99)
NonHDL: 147.09
Triglycerides: 62 mg/dL (ref 0.0–149.0)
VLDL: 12.4 mg/dL (ref 0.0–40.0)

## 2016-11-04 LAB — BASIC METABOLIC PANEL
BUN: 13 mg/dL (ref 6–23)
CO2: 27 mEq/L (ref 19–32)
CREATININE: 0.8 mg/dL (ref 0.40–1.20)
Calcium: 10.1 mg/dL (ref 8.4–10.5)
Chloride: 104 mEq/L (ref 96–112)
GFR: 76.53 mL/min (ref >60.00)
GLUCOSE: 84 mg/dL (ref 70–99)
Potassium: 4.7 mEq/L (ref 3.5–5.1)
Sodium: 140 mEq/L (ref 135–145)

## 2016-11-04 LAB — TSH: TSH: 1.04 u[IU]/mL (ref 0.35–4.50)

## 2016-11-04 NOTE — Assessment & Plan Note (Signed)
Pt's PE WNL.  UTD on pap, mammo, colonoscopy (due next year).  Written screening schedule updated and given to pt.  Check labs.  Anticipatory guidance provided.

## 2016-11-04 NOTE — Progress Notes (Signed)
   Subjective:    Patient ID: [name] [name], female    DOB: [birth year], 66 y.o.   MRN: [id number]  HPI Here today for CPE.  Risk Factors: Hyperlipidemia- chronic problem, not taking Simvastatin since June.  Pt is down 10 lbs since December Hypothyroid- chronic problem, on Thyroid medication daily Physical Activity: not exercising regularly Fall Risk: low Depression: denies Hearing: normal to conversational tones and whispered voice at 6 ft ADL's: independent Cognitive: normal linear thought process, memory and attention intact Home Safety: safe at home Height, Weight, BMI, Visual Acuity: see vitals, vision corrected to 20/20 w/ contacts Counseling: UTD on colonoscopy (due next year), mammo, pap Care team reviewed and updated w/ pt Advanced Directive/healthcare POA- pt has both Labs Ordered: See A&P Care Plan: See A&P     Review of Systems Patient reports no vision/ hearing changes, adenopathy,fever, weight change,  persistant/recurrent hoarseness , swallowing issues, chest pain, palpitations, edema, persistant/recurrent cough, hemoptysis, dyspnea (rest/exertional/paroxysmal nocturnal), gastrointestinal bleeding (melena, rectal bleeding), abdominal pain, significant heartburn, bowel changes, GU symptoms (dysuria, hematuria, incontinence), Gyn symptoms (abnormal  bleeding, pain),  syncope, focal weakness, memory loss, numbness & tingling, skin/hair/nail changes, abnormal bruising or bleeding, anxiety, or depression.     Objective:   Physical Exam General Appearance:    Alert, cooperative, no distress, appears stated age  Head:    Normocephalic, without obvious abnormality, atraumatic  Eyes:    PERRL, conjunctiva/corneas clear, EOM's intact, fundi    benign, both eyes  Ears:    Normal TM's and external ear canals, both ears  Nose:   Nares normal, septum midline, mucosa normal, no drainage    or sinus tenderness  Throat:   Lips, mucosa, and tongue normal; teeth and gums normal    Neck:   Supple, symmetrical, trachea midline, no adenopathy;    Thyroid: no enlargement/tenderness/nodules  Back:     Symmetric, no curvature, ROM normal, no CVA tenderness  Lungs:     Clear to auscultation bilaterally, respirations unlabored  Chest Wall:    No tenderness or deformity   Heart:    Regular rate and rhythm, S1 and S2 normal, no murmur, rub   or gallop  Breast Exam:    Deferred to GYN  Abdomen:     Soft, non-tender, bowel sounds active all four quadrants,    no masses, no organomegaly  Genitalia:    Deferred to GYN  Rectal:    Extremities:   Extremities normal, atraumatic, no cyanosis or edema  Pulses:   2+ and symmetric all extremities  Skin:   Skin color, texture, turgor normal, no rashes or lesions  Lymph nodes:   Cervical, supraclavicular, and axillary nodes normal  Neurologic:   CNII-XII intact, normal strength, sensation and reflexes    throughout          Assessment & Plan:

## 2016-11-04 NOTE — Patient Instructions (Signed)
Follow up in 6 months to recheck cholesterol We'll notify you of your lab results and make any changes if needed Continue to work on healthy diet and regular exercise- you look great!!! Fax a copy of your Advanced Directive/Healthcare POA to [phone] You are up to date on mammo and pap- yay!  Not due for colonoscopy until next year Call with any questions or concerns Happy Valentine's Day!!!

## 2016-11-04 NOTE — Assessment & Plan Note (Signed)
Chronic problem.  Stopped statin in June.  Stressed need for healthy diet and regular exercise.  Applauded her weight loss efforts.  Check labs.  Restart meds prn.

## 2016-11-04 NOTE — Progress Notes (Signed)
Pre visit review using our clinic review tool, if applicable. No additional management support is needed unless otherwise documented below in the visit note. 

## 2016-11-04 NOTE — Assessment & Plan Note (Signed)
Chronic problem.  Currently asymptomatic.  Check labs.  Adjust meds prn  

## 2016-11-05 ENCOUNTER — Other Ambulatory Visit: Payer: Self-pay | Admitting: General Practice

## 2016-11-05 LAB — HEPATITIS C ANTIBODY: HCV AB: NEGATIVE

## 2016-11-05 MED ORDER — SIMVASTATIN 20 MG PO TABS: 20.0000 mg | ORAL_TABLET | Freq: Every day | ORAL | 1 refills | Status: DC

## 2016-11-22 ENCOUNTER — Encounter: Payer: Self-pay | Admitting: Family Medicine

## 2016-11-22 DIAGNOSIS — E038 Other specified hypothyroidism: Secondary | ICD-10-CM

## 2016-11-23 ENCOUNTER — Other Ambulatory Visit: Payer: Self-pay | Admitting: General Practice

## 2016-11-23 MED ORDER — SIMVASTATIN 20 MG PO TABS: 20.0000 mg | ORAL_TABLET | Freq: Every day | ORAL | 1 refills | Status: DC

## 2016-11-23 MED ORDER — OMEPRAZOLE 20 MG PO CPDR: 20.0000 mg | DELAYED_RELEASE_CAPSULE | Freq: Every day | ORAL | 1 refills | Status: DC

## 2016-11-23 MED ORDER — THYROID 120 MG PO TABS: 60.0000 mg | ORAL_TABLET | Freq: Every day | ORAL | 0 refills | Status: DC

## 2016-11-29 DIAGNOSIS — Z96652 Presence of left artificial knee joint: Secondary | ICD-10-CM | POA: Diagnosis not present

## 2016-11-29 NOTE — Telephone Encounter (Signed)
Pharmacist called today and advised that the Thyroid NP 120mg  has no score lines so they would prefer pt to be on the 60mg . I advised that pt was given the 120mg  at the pt request due to insurance cost. Pharmacist advised that she was going to run the cost difference and speak to the pt. Verbal ok was given to change the medication to the 60mg  if pt is agreeable.

## 2016-12-01 DIAGNOSIS — L821 Other seborrheic keratosis: Secondary | ICD-10-CM | POA: Diagnosis not present

## 2016-12-01 DIAGNOSIS — Z08 Encounter for follow-up examination after completed treatment for malignant neoplasm: Secondary | ICD-10-CM | POA: Diagnosis not present

## 2016-12-01 DIAGNOSIS — L570 Actinic keratosis: Secondary | ICD-10-CM | POA: Diagnosis not present

## 2016-12-01 DIAGNOSIS — L720 Epidermal cyst: Secondary | ICD-10-CM | POA: Diagnosis not present

## 2016-12-01 DIAGNOSIS — Z85820 Personal history of malignant melanoma of skin: Secondary | ICD-10-CM | POA: Diagnosis not present

## 2016-12-01 DIAGNOSIS — Z85828 Personal history of other malignant neoplasm of skin: Secondary | ICD-10-CM | POA: Diagnosis not present

## 2016-12-01 DIAGNOSIS — D485 Neoplasm of uncertain behavior of skin: Secondary | ICD-10-CM | POA: Diagnosis not present

## 2016-12-16 ENCOUNTER — Other Ambulatory Visit: Payer: Self-pay | Admitting: Family Medicine

## 2017-01-05 ENCOUNTER — Encounter (HOSPITAL_COMMUNITY): Payer: Self-pay

## 2017-01-05 ENCOUNTER — Emergency Department (HOSPITAL_COMMUNITY)
Admission: EM | Admit: 2017-01-05 | Discharge: 2017-01-05 | Disposition: A | Discharge status: Home / Self Care | Payer: Medicare Other | Attending: Emergency Medicine | Admitting: Emergency Medicine

## 2017-01-05 ENCOUNTER — Telehealth: Payer: Self-pay | Admitting: Family Medicine

## 2017-01-05 DIAGNOSIS — Z85828 Personal history of other malignant neoplasm of skin: Secondary | ICD-10-CM | POA: Insufficient documentation

## 2017-01-05 DIAGNOSIS — Z87891 Personal history of nicotine dependence: Secondary | ICD-10-CM | POA: Diagnosis not present

## 2017-01-05 DIAGNOSIS — Z79899 Other long term (current) drug therapy: Secondary | ICD-10-CM | POA: Diagnosis not present

## 2017-01-05 DIAGNOSIS — E039 Hypothyroidism, unspecified: Secondary | ICD-10-CM | POA: Diagnosis not present

## 2017-01-05 DIAGNOSIS — K625 Hemorrhage of anus and rectum: Secondary | ICD-10-CM

## 2017-01-05 DIAGNOSIS — I10 Essential (primary) hypertension: Secondary | ICD-10-CM | POA: Insufficient documentation

## 2017-01-05 DIAGNOSIS — Z96652 Presence of left artificial knee joint: Secondary | ICD-10-CM | POA: Insufficient documentation

## 2017-01-05 LAB — COMPREHENSIVE METABOLIC PANEL
ALBUMIN: 4.3 g/dL (ref 3.5–5.0)
ALK PHOS: 89 U/L (ref 38–126)
ALT: 29 U/L (ref 14–54)
AST: 29 U/L (ref 15–41)
Anion gap: 9 (ref 5–15)
BILIRUBIN TOTAL: 0.7 mg/dL (ref 0.3–1.2)
BUN: 19 mg/dL (ref 6–20)
CALCIUM: 9.7 mg/dL (ref 8.9–10.3)
CO2: 25 mmol/L (ref 22–32)
CREATININE: 0.77 mg/dL (ref 0.44–1.00)
Chloride: 107 mmol/L (ref 101–111)
GFR calc Af Amer: >60 mL/min (ref >60)
GFR calc non Af Amer: >60 mL/min (ref >60)
GLUCOSE: 103 mg/dL — AB (ref 65–99)
Potassium: 4.5 mmol/L (ref 3.5–5.1)
SODIUM: 141 mmol/L (ref 135–145)
Total Protein: 7.1 g/dL (ref 6.5–8.1)

## 2017-01-05 LAB — CBC
HCT: 42.6 % (ref 36.0–46.0)
HEMOGLOBIN: 14.4 g/dL (ref 12.0–15.0)
MCH: 29 pg (ref 26.0–34.0)
MCHC: 33.8 g/dL (ref 30.0–36.0)
MCV: 85.7 fL (ref 78.0–100.0)
Platelets: 220 10*3/uL (ref 150–400)
RBC: 4.97 MIL/uL (ref 3.87–5.11)
RDW: 14.5 % (ref 11.5–15.5)
WBC: 6.5 10*3/uL (ref 4.0–10.5)

## 2017-01-05 LAB — POC OCCULT BLOOD, ED: Fecal Occult Bld: POSITIVE — AB

## 2017-01-05 LAB — TYPE AND SCREEN
ABO/RH(D): A POS
Antibody Screen: NEGATIVE

## 2017-01-05 NOTE — ED Provider Notes (Signed)
[facility] DEPT Provider Note   CSN: [id number] Arrival date & time: 01/05/17  1316     History   Chief Complaint Chief Complaint  Patient presents with  . Rectal Bleeding    HPI [name] [name] is a 66 y.o. female.  Patient with PMH of IBS, HL, hypothyroid, HTN, presents to the ED with a chief complaint of rectal bleeding.  She states that she had 3 episodes of diarrhea last night and has had a lot of gas.  During the last episode of diarrhea, she states that she had about 2 tablespoons of bright red blood in the toilet.  She denies any abdominal pain, nausea, or vomiting.  She denies any fevers or chills.  She takes 325 of aspirin daily, but not otherwise anticoagulated.   The history is provided by the patient. No language interpreter was used.    Past Medical History:  Diagnosis Date  . Allergy, unspecified not elsewhere classified   . Borderline hypertension   . Bronchitis   . DJD (degenerative joint disease)   . GERD (gastroesophageal reflux disease)   . History of skin cancer   . Hypercholesterolemia   . Hypothyroidism   . IBS (irritable bowel syndrome)   . PONV (postoperative nausea and vomiting)   . Postoperative nausea and vomiting 09/02/2015  . Primary localized osteoarthritis of left knee   . Skin cancer (melanoma) (HCC)    basil cell   . Vitamin D deficiency     Patient Active Problem List   Diagnosis Date Noted  . Postoperative nausea and vomiting 09/02/2015  . DJD (degenerative joint disease) of knee 09/01/2015  . Primary localized osteoarthritis of left knee   . Bronchitis   . Borderline hypertension   . IBS (irritable bowel syndrome)   . Physical exam 05/30/2015  . Acute bacterial bronchitis 12/25/2014  . Hypothyroidism 11/22/2014  . Vitamin D deficiency 09/21/2009  . Hyperlipidemia 09/21/2009  . Left knee pain 09/15/2009  . SKIN CANCER, HX OF 09/15/2009  . Anxiety state 12/19/2007  . HYPERTENSION, BORDERLINE 12/19/2007  . BRONCHITIS  12/19/2007  . GERD 12/19/2007  . IBS 12/19/2007  . ALLERGY 12/19/2007    Past Surgical History:  Procedure Laterality Date  . BUNIONECTOMY  2004   left  . FOOT OSTEOTOMY  2003   right  . HERNIA REPAIR  1957  . KNEE ARTHROSCOPY  1987&2003   left  . SHOULDER ARTHROSCOPY WITH OPEN ROTATOR CUFF REPAIR  2013   right  . SKIN SURGERY  4/05   Melanoma surg left shoulder  . TOTAL KNEE ARTHROPLASTY Left 09/01/2015   Procedure: TOTAL KNEE ARTHROPLASTY;  Surgeon: Elsie Saas, MD;  Location: [facility];  Service: Orthopedics;  Laterality: Left;  . TUBAL LIGATION  1990    OB History    No data available       Home Medications    Prior to Admission medications   Medication Sig Start Date End Date Taking? Authorizing Provider  ammonium lactate (AMLACTIN) 12 % cream APP AA ONCE OR BID  AFTER SHOWER 08/05/16   Historical Provider, MD  Cholecalciferol (VITAMIN D3) 5000 UNITS CAPS Take 1 capsule by mouth daily.    Historical Provider, MD  doxycycline (VIBRAMYCIN) 100 MG capsule TK 1 C PO BID WF 09/01/16   Historical Provider, MD  Magnesium (MAGNESIUM GLYCINATE PLUS) 110 MG CAPS Take 200 mg by mouth daily.     Historical Provider, MD  Multiple Vitamins-Minerals (MULTIVITAMIN PO) Take 1 tablet by mouth daily.  Historical Provider, MD  omeprazole (PRILOSEC) 20 MG capsule TAKE ONE CAPSULE BY MOUTH DAILY 12/16/16   Midge Minium, MD  simvastatin (ZOCOR) 20 MG tablet Take 1 tablet (20 mg total) by mouth at bedtime. 11/23/16   Midge Minium, MD  thyroid (NP THYROID) 120 MG tablet Take 0.5 tablets (60 mg total) by mouth daily before breakfast. 11/23/16   Midge Minium, MD    Family History Family History  Problem Relation Age of Onset  . Colon polyps Father   . Diabetes Son   . Colon cancer Neg Hx   . Stomach cancer Neg Hx     Social History Social History  Substance Use Topics  . Smoking status: Former Smoker    Years: 6.00    Quit date: 10/05/1975  . Smokeless tobacco: Never  Used  . Alcohol use 6.0 oz/week    12 Standard drinks or equivalent per week     Comment: socially     Allergies   Clindamycin/lincomycin; Flagyl [metronidazole hcl]; Augmentin [amoxicillin-pot clavulanate]; and Telithromycin   Review of Systems Review of Systems  All other systems reviewed and are negative.    Physical Exam Updated Vital Signs BP 130/68 (BP Location: Right Arm)   Pulse 89   Temp 98 F (36.7 C) (Oral)   Resp 17   Ht 5\' 6"  (1.676 m)   Wt 86.2 kg   SpO2 97%   BMI 30.67 kg/m   Physical Exam  Constitutional: She is oriented to person, place, and time. She appears well-developed and well-nourished.  HENT:  Head: Normocephalic and atraumatic.  Eyes: Conjunctivae and EOM are normal. Pupils are equal, round, and reactive to light.  Neck: Normal range of motion. Neck supple.  Cardiovascular: Normal rate and regular rhythm.  Exam reveals no gallop and no friction rub.   No murmur heard. Pulmonary/Chest: Effort normal and breath sounds normal. No respiratory distress. She has no wheezes. She has no rales. She exhibits no tenderness.  Abdominal: Soft. Bowel sounds are normal. She exhibits no distension and no mass. There is no tenderness. There is no rebound and no guarding.  No focal abdominal tenderness, no RLQ tenderness or pain at McBurney's point, no RUQ tenderness or Murphy's sign, no left-sided abdominal tenderness, no fluid wave, or signs of peritonitis   Genitourinary:  Genitourinary Comments: Chaperone present No gross blood on DRE No fissure or hemorrhoid  Musculoskeletal: Normal range of motion. She exhibits no edema or tenderness.  Neurological: She is alert and oriented to person, place, and time.  Skin: Skin is warm and dry.  Psychiatric: She has a normal mood and affect. Her behavior is normal. Judgment and thought content normal.  Nursing note and vitals reviewed.    ED Treatments / Results  Labs (all labs ordered are listed, but only  abnormal results are displayed) Labs Reviewed  COMPREHENSIVE METABOLIC PANEL - Abnormal; Notable for the following:       Result Value   Glucose, Bld 103 (*)    All other components within normal limits  POC OCCULT BLOOD, ED - Abnormal; Notable for the following:    Fecal Occult Bld POSITIVE (*)    All other components within normal limits  CBC  TYPE AND SCREEN    EKG  EKG Interpretation None       Radiology No results found.  Procedures Procedures (including critical care time)  Medications Ordered in ED Medications - No data to display   Initial Impression / Assessment and  Plan / ED Course  I have reviewed the triage vital signs and the nursing notes.  Pertinent labs & imaging results that were available during my care of the patient were reviewed by me and considered in my medical decision making (see chart for details).     Patient with one episode of rectal bleeding last night.  No further bleeding.  Takes a full aspirin, but hasn't taken one today.  Not in any pain.  VSS.  Saw [facility] 9 years ago for colonoscopy, is scheduled to see them next year.  Patient is very well appearing.  VSS.  No further bleeding.  Patient seen by and discussed with Dr. Tyrone Nine, who agrees with plan for discharge to home with close follow-up with GI.  Patient understands and agrees with the plan.  Final Clinical Impressions(s) / ED Diagnoses   Final diagnoses:  Rectal bleeding    New Prescriptions New Prescriptions   No medications on file     Montine Circle, PA-C 01/05/17 [place], DO 01/05/17 2329

## 2017-01-05 NOTE — Telephone Encounter (Signed)
Patient being seen at ER currently

## 2017-01-05 NOTE — Telephone Encounter (Signed)
[facility]     Patient Name: [name] [name] Initial Comment Caller states that she had some diarrhea last night two different episode and this morning when she had it again there was all red blood twice. She also had some stomach cramps and they are gone. She has been taking anti-inflammatory for her knee for a month now.   DOB: 16-Jun-1951      Nurse Assessment  Nurse: Julien Girt, RN, Almyra Free Date/Time Eilene Ghazi Time): 01/05/2017 12:11:54 PM  Confirm and document reason for call. If symptomatic, describe symptoms. ---Caller states that she had some diarrhea last night, two different episodes, slept all night but this morning when she got up this morning she had 2 episodes that contained bright red blood. She also had some stomach cramps but they have resolved. Adds she has been taking anti-inflammatory for her knee for a month now.  Does the patient have any new or worsening symptoms? ---Yes  Will a triage be completed? ---Yes  Related visit to physician within the last 2 weeks? ---No  Does the PT have any chronic conditions? (i.e. diabetes, asthma, etc.) ---Yes  List chronic conditions. ---TKA left, GERD, Hypothyroidism, High cholesterol, Rosacea  Is this a behavioral health or substance abuse call? ---No    Guidelines     Guideline Title Affirmed Question Affirmed Notes   Diarrhea [1] Blood in the stool AND [2] moderate or large amount of blood    Final Disposition User   Go to ED Now Julien Girt, RN, Almyra Free     Referrals   GO TO FACILITY OTHER - SPECIFY   Disagree/Comply: [place] [facility]

## 2017-01-05 NOTE — ED Triage Notes (Signed)
Pt presents to the ed with her husband with complaints of waking up at 0200 with severe abdominal pain and diarrhea.  She woke up again with diarrhea and had some blood in it and then woke up again and went to the bathroom and states it was straight blood.  Pt denies pain at this time. The last time she had to go to the bathroom with blood in it was 0930.  She takes an aspirin a day and also has been taking meloxicam for the last month.

## 2017-01-14 ENCOUNTER — Ambulatory Visit: Payer: Medicare Other | Admitting: Physician Assistant

## 2017-01-17 ENCOUNTER — Encounter: Payer: Self-pay | Admitting: Physician Assistant

## 2017-01-17 ENCOUNTER — Ambulatory Visit (INDEPENDENT_AMBULATORY_CARE_PROVIDER_SITE_OTHER): Payer: Medicare Other | Admitting: Physician Assistant

## 2017-01-17 ENCOUNTER — Encounter: Payer: BLUE CROSS/BLUE SHIELD | Admitting: Family Medicine

## 2017-01-17 VITALS — BP 124/70 | HR 84 | Ht 66.0 in | Wt 197.6 lb

## 2017-01-17 DIAGNOSIS — K625 Hemorrhage of anus and rectum: Secondary | ICD-10-CM

## 2017-01-17 DIAGNOSIS — Z860101 Personal history of adenomatous and serrated colon polyps: Secondary | ICD-10-CM | POA: Insufficient documentation

## 2017-01-17 DIAGNOSIS — Z86010 Personal history of colonic polyps: Secondary | ICD-10-CM

## 2017-01-17 HISTORY — DX: Personal history of adenomatous and serrated colon polyps: Z86.0101

## 2017-01-17 HISTORY — DX: Personal history of colonic polyps: Z86.010

## 2017-01-17 MED ORDER — NA SULFATE-K SULFATE-MG SULF 17.5-3.13-1.6 GM/177ML PO SOLN: 1.0000 | ORAL | 0 refills | Status: DC

## 2017-01-17 NOTE — Progress Notes (Signed)
 Subjective:    Patient ID: [name] [name], female    DOB: 12/24/1950, 66 y.o.   MRN: [id number]  HPI [name] [name] is a pleasant 66-year-old white female, former patient of Dr. David [name] who comes in today with recent rectal bleeding. Patient has history of hypertension, IBS, GERD, degenerative joint disease, and is status post left total knee replacement. [name] [name] She last had colonoscopy in September 2014 with finding of 13-5 mm polyp which was a tubular adenoma, mild sigmoid diverticulosis. [name] [name] Patient had a recent ER visit on 01/05/2017 with rectal bleeding. She says she was awakened at about 2 AM on the day of the ER visit with fairly severe abdominal cramping followed by multiple episodes of diarrhea. She says she went back to bed briefly was back up with further diarrhea, then early in the morning started passing bright red blood with very little stool She had 2 episodes of this and then no further diarrhea or bleeding. She did have some residual abdominal discomfort and a lot of gas. No nausea vomiting, no fever and no diaphoresis that she recalls. She was seen and evaluated in the emergency room, did not have any imaging. Hemoglobin was 14.4 hematocrit of 42.6 and see met unremarkable. She had been taking meloxicam on a regular basis which she stopped. She also takes a regular 325 mg aspirin daily. She's not had any further problems with bleeding and has no prior history of bleeding.  Review of Systems Pertinent positive and negative review of systems were noted in the above HPI section.  All other review of systems was otherwise negative.  Outpatient Encounter Prescriptions as of 01/17/2017  Medication Sig  . aspirin 325 MG tablet Take 325 mg by mouth daily.  . Cholecalciferol (VITAMIN D3) 5000 UNITS CAPS Take 5,000 Units by mouth daily.   . diclofenac sodium (VOLTAREN) 1 % GEL Apply 1 application topically daily as needed for pain.  . doxycycline (ADOXA) 50 MG tablet Take 50 mg by  mouth daily.  . Magnesium 200 MG TABS Take 200 mg by mouth daily.  . meloxicam (MOBIC) 15 MG tablet Take 15 mg by mouth as needed.   . Multiple Vitamins-Minerals (MULTIVITAMIN PO) Take 1 tablet by mouth daily.  . NP THYROID 60 MG tablet Take 60 mg by mouth daily before breakfast.  . omeprazole (PRILOSEC) 20 MG capsule TAKE ONE CAPSULE BY MOUTH DAILY  . simvastatin (ZOCOR) 20 MG tablet Take 1 tablet (20 mg total) by mouth at bedtime.  . Na Sulfate-K Sulfate-Mg Sulf (SUPREP BOWEL PREP KIT) 17.5-3.13-1.6 GM/180ML SOLN Take 1 kit by mouth as directed.   No facility-administered encounter medications on file as of 01/17/2017.    Allergies  Allergen Reactions  . Clindamycin/Lincomycin Diarrhea  . Flagyl [Metronidazole Hcl] Diarrhea    Stomach upset. Pt states she CANNOT TAKE oral, has no problem with topical.   . Augmentin [Amoxicillin-Pot Clavulanate] Diarrhea  . Telithromycin Diarrhea   Patient Active Problem List   Diagnosis Date Noted  . Hx of adenomatous colonic polyps 01/17/2017  . Postoperative nausea and vomiting 09/02/2015  . DJD (degenerative joint disease) of knee 09/01/2015  . Primary localized osteoarthritis of left knee   . Bronchitis   . Borderline hypertension   . IBS (irritable bowel syndrome)   . Physical exam 05/30/2015  . Acute bacterial bronchitis 12/25/2014  . Hypothyroidism 11/22/2014  . Vitamin D deficiency 09/21/2009  . Hyperlipidemia 09/21/2009  . Left knee pain 09/15/2009  . SKIN CANCER, HX OF 09/15/2009  .   Anxiety state 12/19/2007  . HYPERTENSION, BORDERLINE 12/19/2007  . BRONCHITIS 12/19/2007  . GERD 12/19/2007  . IBS 12/19/2007  . ALLERGY 12/19/2007   Social History   Social History  . Marital status: Married    Spouse name: [name] [name] [phone]  . Number of children: 2  . Years of education: N/A   Occupational History  . Not on file.   Social History Main Topics  . Smoking status: Former Smoker    Years: 6.00    Quit date: 10/05/1975  .  Smokeless tobacco: Never Used  . Alcohol use 6.0 oz/week    12 Standard drinks or equivalent per week     Comment: socially  . Drug use: No  . Sexual activity: Not on file   Other Topics Concern  . Not on file   Social History Narrative  . No narrative on file    [name] [name]'s family history includes Cancer in her father; Colon polyps in her father; Diabetes in her son.      Objective:    Vitals:   01/17/17 1058  BP: 124/70  Pulse: 84    Physical Exam  well-developed older white female in no acute distress, quite pleasant blood pressure 124/70, pulse 84, height 5 foot 6, weight 197, BMI 31.8. HEENT; nontraumatic normocephalic EOMI PERRLA sclera anicteric, Cardiovascular; regular rate and rhythm with S1-S2 no murmur or gallop, Pulmonary; clear bilaterally, Abdomen,; nontender nondistended bowel sounds are active there is no palpable mass or hepatosplenomegaly, Rectal ;exam not done, Extremities ;no clubbing cyanosis or edema skin warm and dry, Neuropsych ;mood and affect appropriate       Assessment & Plan:   #1  66-year-old female with an episode of severe abdominal cramping followed by diarrhea then bright red blood per rectum on 2 occasions on 01/05/2017. Patient has had no further bleeding or abdominal discomfort. Her history is consistent with possible segmental ischemic colitis. Doubt diverticular bleeding as this episode was preceded by severe abdominal cramping and diarrhea. Rule out occult colon lesion or other colitis  #2 history of adenomatous colon polyps #3 GERD #4 history of IBS  Plan; Discussed natural history of segmental ischemic colitis, and supportive management. Advised patient to remain off of meloxicam We discussed observation at this point versus proceeding with colonoscopy.. She would like to proceed with follow-up colonoscopy. This will be scheduled with Dr. Stark, with whom she will be established.. Procedure discussed in detail with the patient  including risks and benefits and she is agreeable to proceed. Also asked patient to stop her 325 mg aspirin 5 days prior to colonoscopy as she is using this for general purposes. She knows to call us for any problems with recurrent diarrhea or rectal bleeding.   S  PA-C 01/17/2017   Cc: Tabori, Katherine E, MD  

## 2017-01-17 NOTE — Patient Instructions (Signed)
You have been scheduled for a colonoscopy. Please follow written instructions given to you at your visit today.  Please pick up your prep supplies at the pharmacy within the next 1-3 days. If you use inhalers (even only as needed), please bring them with you on the day of your procedure. Your physician has requested that you go to www.startemmi.com and enter the access code given to you at your visit today. This web site gives a general overview about your procedure. However, you should still follow specific instructions given to you by our office regarding your preparation for the procedure.  Sop aspirin 325 mg 5 days prior to procedure.

## 2017-01-17 NOTE — Progress Notes (Signed)
Reviewed and agree with management plan.  Donshay Lupinski T. Jabarri Stefanelli, MD FACG 

## 2017-02-18 ENCOUNTER — Encounter: Payer: Self-pay | Admitting: Family Medicine

## 2017-02-18 ENCOUNTER — Ambulatory Visit (INDEPENDENT_AMBULATORY_CARE_PROVIDER_SITE_OTHER): Payer: Medicare Other | Admitting: Family Medicine

## 2017-02-18 ENCOUNTER — Telehealth: Payer: Self-pay | Admitting: Family Medicine

## 2017-02-18 VITALS — BP 130/80 | HR 76 | Temp 98.0°F | Resp 16 | Ht 66.0 in | Wt 192.0 lb

## 2017-02-18 DIAGNOSIS — R0789 Other chest pain: Secondary | ICD-10-CM

## 2017-02-18 DIAGNOSIS — E039 Hypothyroidism, unspecified: Secondary | ICD-10-CM | POA: Diagnosis not present

## 2017-02-18 DIAGNOSIS — R079 Chest pain, unspecified: Secondary | ICD-10-CM | POA: Diagnosis not present

## 2017-02-18 LAB — COMPREHENSIVE METABOLIC PANEL
ALT: 22 U/L (ref 6–29)
AST: 23 U/L (ref 10–35)
Albumin: 4.4 g/dL (ref 3.6–5.1)
Alkaline Phosphatase: 72 U/L (ref 33–130)
BUN: 13 mg/dL (ref 7–25)
CHLORIDE: 108 mmol/L (ref 98–110)
CO2: 22 mmol/L (ref 20–31)
Calcium: 9.2 mg/dL (ref 8.6–10.4)
Creat: 0.74 mg/dL (ref 0.50–0.99)
GLUCOSE: 98 mg/dL (ref 65–99)
POTASSIUM: 3.8 mmol/L (ref 3.5–5.3)
Sodium: 141 mmol/L (ref 135–146)
Total Bilirubin: 0.5 mg/dL (ref 0.2–1.2)
Total Protein: 6.5 g/dL (ref 6.1–8.1)

## 2017-02-18 LAB — CBC WITH DIFFERENTIAL/PLATELET
BASOS PCT: 0 %
Basophils Absolute: 0 cells/uL (ref 0–200)
EOS ABS: 122 {cells}/uL (ref 15–500)
Eosinophils Relative: 2 %
HCT: 38.2 % (ref 35.0–45.0)
Hemoglobin: 12.8 g/dL (ref 11.7–15.5)
LYMPHS PCT: 34 %
Lymphs Abs: 2074 cells/uL (ref 850–3900)
MCH: 28.6 pg (ref 27.0–33.0)
MCHC: 33.5 g/dL (ref 32.0–36.0)
MCV: 85.3 fL (ref 80.0–100.0)
MONOS PCT: 8 %
MPV: 9.1 fL (ref 7.5–12.5)
Monocytes Absolute: 488 cells/uL (ref 200–950)
Neutro Abs: 3416 cells/uL (ref 1500–7800)
Neutrophils Relative %: 56 %
PLATELETS: 241 10*3/uL (ref 140–400)
RBC: 4.48 MIL/uL (ref 3.80–5.10)
RDW: 15.6 % — AB (ref 11.0–15.0)
WBC: 6.1 10*3/uL (ref 3.8–10.8)

## 2017-02-18 LAB — TROPONIN I: TNIDX: 0 ug/L (ref 0.00–0.06)

## 2017-02-18 NOTE — Telephone Encounter (Signed)
[facility] Patient Name: [name] [name] DOB: Dec 31, 1950 Initial Comment Caller says , chest sensations, shooting sensations from Rt side, then also has feeling like her heart is being touched, no pain, no pressure or tightness. Nurse Assessment Nurse: Dimas Chyle, RN, Dellis Filbert Date/Time Eilene Ghazi Time): 02/18/2017 11:12:02 AM Confirm and document reason for call. If symptomatic, describe symptoms. ---Caller says, chest sensations, shooting sensations from Rt side, then also has feeling like her heart is being touched, no pain, no pressure or tightness. Symptoms started last night. Does the patient have any new or worsening symptoms? ---Yes Will a triage be completed? ---Yes Related visit to physician within the last 2 weeks? ---No Does the PT have any chronic conditions? (i.e. diabetes, asthma, etc.) ---Yes List chronic conditions. ---hyperlipidemia, hypothyroid, GERD Is this a behavioral health or substance abuse call? ---No Guidelines Guideline Title Affirmed Question Affirmed Notes Chest Pain [1] Chest pain lasting <= 5 minutes AND [2] NO chest pain or cardiac symptoms now (Exceptions: pains lasting a few seconds) Final Disposition User See Physician within [place], RN, [employer] Referrals REFERRED TO PCP OFFICE Disagree/Comply: Leta Baptist

## 2017-02-18 NOTE — Progress Notes (Signed)
Patient ID: [name] [name], female   DOB: 04-Feb-1951, 66 y.o.   MRN: [id number]     Subjective:  I acted as a Education administrator for Dr. Carollee Herter.  [name] [name], [place]   Patient ID: [name] [name], female    DOB: 01/27/1951, 66 y.o.   MRN: [id number]  Chief Complaint  Patient presents with  . chest discomfort    HPI  Patient is in today for chest discomfort.  Has had since yesterday evening around 9pm.  Denies any chest pain and states its just discomfort.  Patient Care Team: Midge Minium, MD as PCP - General (Family Medicine) Roque Cash., MD as Consulting Physician (Obstetrics and Gynecology) [facility], Katharine Look, MD as Referring Physician (Dermatology) Elsie Saas, MD as Consulting Physician (Orthopedic Surgery) [place], [place], [place] as Consulting Physician (Optometry) [place], Tamala Fothergill, DPM as Consulting Physician (Podiatry)   Past Medical History:  Diagnosis Date  . Allergy, unspecified not elsewhere classified   . Borderline hypertension   . Bronchitis   . DJD (degenerative joint disease)   . GERD (gastroesophageal reflux disease)   . History of skin cancer   . Hypercholesterolemia   . Hypothyroidism   . IBS (irritable bowel syndrome)   . PONV (postoperative nausea and vomiting)   . Postoperative nausea and vomiting 09/02/2015  . Primary localized osteoarthritis of left knee   . Skin cancer (melanoma) (HCC)    basil cell   . Vitamin D deficiency     Past Surgical History:  Procedure Laterality Date  . BUNIONECTOMY  2004   left  . FOOT OSTEOTOMY  2003   right  . HERNIA REPAIR  1957  . KNEE ARTHROSCOPY  1987&2003   left  . MELANOMA EXCISION Right    shoulder  . SHOULDER ARTHROSCOPY WITH OPEN ROTATOR CUFF REPAIR  2013   right  . SKIN SURGERY  4/05   Melanoma surg left shoulder  . TOTAL KNEE ARTHROPLASTY Left 09/01/2015   Procedure: TOTAL KNEE ARTHROPLASTY;  Surgeon: Elsie Saas, MD;  Location: [facility];  Service: Orthopedics;  Laterality: Left;  . TUBAL  LIGATION  1990    Family History  Problem Relation Age of Onset  . Colon polyps Father   . Cancer Father        laryngeal  . Diabetes Son   . Colon cancer Neg Hx   . Stomach cancer Neg Hx     Social History   Social History  . Marital status: Married    Spouse name: [name] [name] [phone]  . Number of children: 2  . Years of education: N/A   Occupational History  . Not on file.   Social History Main Topics  . Smoking status: Former Smoker    Years: 6.00    Quit date: 10/05/1975  . Smokeless tobacco: Never Used  . Alcohol use 6.0 oz/week    12 Standard drinks or equivalent per week     Comment: socially  . Drug use: No  . Sexual activity: Not on file   Other Topics Concern  . Not on file   Social History Narrative  . No narrative on file    Outpatient Medications Prior to Visit  Medication Sig Dispense Refill  . aspirin 325 MG tablet Take 325 mg by mouth daily.    . Cholecalciferol (VITAMIN D3) 5000 UNITS CAPS Take 5,000 Units by mouth daily.     Marland Kitchen doxycycline (ADOXA) 50 MG tablet Take 50 mg by mouth daily.    . Magnesium  200 MG TABS Take 200 mg by mouth daily.    . Multiple Vitamins-Minerals (MULTIVITAMIN PO) Take 1 tablet by mouth daily.    . Na Sulfate-K Sulfate-Mg Sulf (SUPREP BOWEL PREP KIT) 17.5-3.13-1.6 GM/180ML SOLN Take 1 kit by mouth as directed. 324 mL 0  . NP THYROID 60 MG tablet Take 60 mg by mouth daily before breakfast.  3  . omeprazole (PRILOSEC) 20 MG capsule TAKE ONE CAPSULE BY MOUTH DAILY 90 capsule 1  . simvastatin (ZOCOR) 20 MG tablet Take 1 tablet (20 mg total) by mouth at bedtime. 90 tablet 1  . diclofenac sodium (VOLTAREN) 1 % GEL Apply 1 application topically daily as needed for pain.  2  . meloxicam (MOBIC) 15 MG tablet Take 15 mg by mouth as needed.   3   No facility-administered medications prior to visit.     Allergies  Allergen Reactions  . Clindamycin/Lincomycin Diarrhea  . Flagyl [Metronidazole Hcl] Diarrhea    Stomach upset. Pt  states she CANNOT TAKE oral, has no problem with topical.   . Augmentin [Amoxicillin-Pot Clavulanate] Diarrhea  . Telithromycin Diarrhea    Review of Systems  Constitutional: Negative for fever and malaise/fatigue.  HENT: Negative for congestion.   Eyes: Negative for blurred vision.  Respiratory: Negative for cough and shortness of breath.   Cardiovascular: Negative for chest pain, palpitations and leg swelling.  Gastrointestinal: Negative for vomiting.  Musculoskeletal: Negative for back pain.  Skin: Negative for rash.  Neurological: Negative for loss of consciousness and headaches.       Objective:    Physical Exam  Constitutional: She is oriented to person, place, and time. She appears well-developed and well-nourished. No distress.  HENT:  Head: Normocephalic and atraumatic.  Eyes: Conjunctivae are normal.  Neck: Normal range of motion. No thyromegaly present.  Cardiovascular: Normal rate and regular rhythm.   Pulmonary/Chest: Effort normal and breath sounds normal. She has no wheezes.  Abdominal: Soft. Bowel sounds are normal. There is no tenderness.  Musculoskeletal: Normal range of motion. She exhibits no edema or deformity.  Neurological: She is alert and oriented to person, place, and time.  Skin: Skin is warm and dry. She is not diaphoretic.  Psychiatric: She has a normal mood and affect.    BP 130/80 (BP Location: Left Arm, Cuff Size: Normal)   Pulse 76   Temp 98 F (36.7 C) (Oral)   Resp 16   Ht '5\' 6"'  (1.676 m)   Wt 192 lb (87.1 kg)   SpO2 98%   BMI 30.99 kg/m  Wt Readings from Last 3 Encounters:  02/18/17 192 lb (87.1 kg)  01/17/17 197 lb 9.6 oz (89.6 kg)  01/05/17 190 lb (86.2 kg)   BP Readings from Last 3 Encounters:  02/18/17 130/80  01/17/17 124/70  01/05/17 127/81     Immunization History  Administered Date(s) Administered  . Influenza Split 08/04/2012  . Influenza,inj,Quad PF,36+ Mos 11/22/2014, 07/01/2015, 06/04/2016  . Tdap 10/01/2011    . Zoster 12/03/2015    Health Maintenance  Topic Date Due  . PNA vac Low Risk Adult (1 of 2 - PCV13) 11/26/2016  . INFLUENZA VACCINE  07/04/2017 (Originally 05/04/2017)  . HIV Screening  07/04/2017 (Originally 11/26/1966)  . MAMMOGRAM  08/12/2017  . COLONOSCOPY  07/03/2018  . PAP SMEAR  08/13/2019  . TETANUS/TDAP  09/30/2021  . DEXA SCAN  Completed  . Hepatitis C Screening  Completed    Lab Results  Component Value Date   WBC 6.1 02/18/2017  HGB 12.8 02/18/2017   HCT 38.2 02/18/2017   PLT 241 02/18/2017   GLUCOSE 98 02/18/2017   CHOL 189 11/04/2016   TRIG 62.0 11/04/2016   HDL 41.80 11/04/2016   LDLCALC 135 (H) 11/04/2016   ALT 22 02/18/2017   AST 23 02/18/2017   NA 141 02/18/2017   K 3.8 02/18/2017   CL 108 02/18/2017   CREATININE 0.74 02/18/2017   BUN 13 02/18/2017   CO2 22 02/18/2017   TSH 1.06 02/18/2017   INR 1.11 08/21/2015   HGBA1C 5.5 03/18/2015    Lab Results  Component Value Date   TSH 1.06 02/18/2017   Lab Results  Component Value Date   WBC 6.1 02/18/2017   HGB 12.8 02/18/2017   HCT 38.2 02/18/2017   MCV 85.3 02/18/2017   PLT 241 02/18/2017   Lab Results  Component Value Date   NA 141 02/18/2017   K 3.8 02/18/2017   CO2 22 02/18/2017   GLUCOSE 98 02/18/2017   BUN 13 02/18/2017   CREATININE 0.74 02/18/2017   BILITOT 0.5 02/18/2017   ALKPHOS 72 02/18/2017   AST 23 02/18/2017   ALT 22 02/18/2017   PROT 6.5 02/18/2017   ALBUMIN 4.4 02/18/2017   CALCIUM 9.2 02/18/2017   ANIONGAP 9 01/05/2017   GFR 76.53 11/04/2016   Lab Results  Component Value Date   CHOL 189 11/04/2016   Lab Results  Component Value Date   HDL 41.80 11/04/2016   Lab Results  Component Value Date   LDLCALC 135 (H) 11/04/2016   Lab Results  Component Value Date   TRIG 62.0 11/04/2016   Lab Results  Component Value Date   CHOLHDL 5 11/04/2016   Lab Results  Component Value Date   HGBA1C 5.5 03/18/2015   ekg--[id number]      Assessment & Plan:   Problem  List Items Addressed This Visit    None    Visit Diagnoses    Chest discomfort    -  Primary   Relevant Orders   EKG 12-Lead (Completed)   CBC with Differential/Platelet (Completed)   Comprehensive metabolic panel (Completed)   T3, free (Completed)   T4, free (Completed)   TSH (Completed)   Troponin I (Completed)   Chest pain at rest       Relevant Orders   Myocardial Perfusion Imaging   ECHOCARDIOGRAM COMPLETE   T3, free (Completed)   T4, free (Completed)   TSH (Completed)   Troponin I (Completed)      I have discontinued Ms. Umholtz's diclofenac sodium and meloxicam. I am also having her maintain her Vitamin D3, Multiple Vitamins-Minerals (MULTIVITAMIN PO), simvastatin, omeprazole, doxycycline, aspirin, NP THYROID, Magnesium, and Na Sulfate-K Sulfate-Mg Sulf.  No orders of the defined types were placed in this encounter.   CMA served as Education administrator during this visit. History, Physical and Plan performed by medical provider. Documentation and orders reviewed and attested to.  Ann Held, DO

## 2017-02-18 NOTE — Patient Instructions (Signed)
Chest Wall Pain °Chest wall pain is pain in or around the bones and muscles of your chest. Sometimes, an injury causes this pain. Sometimes, the cause may not be known. This pain may take several weeks or longer to get better. °Follow these instructions at home: °Pay attention to any changes in your symptoms. Take these actions to help with your pain: °· Rest as told by your health care provider. °· Avoid activities that cause pain. These include any activities that use your chest muscles or your abdominal and side muscles to lift heavy items. °· If directed, apply ice to the painful area: °¨ Put ice in a plastic bag. °¨ Place a towel between your skin and the bag. °¨ Leave the ice on for 20 minutes, 2-3 times per day. °· Take over-the-counter and prescription medicines only as told by your health care provider. °· Do not use tobacco products, including cigarettes, chewing tobacco, and e-cigarettes. If you need help quitting, ask your health care provider. °· Keep all follow-up visits as told by your health care provider. This is important. °Contact a health care provider if: °· You have a fever. °· Your chest pain becomes worse. °· You have new symptoms. °Get help right away if: °· You have nausea or vomiting. °· You feel sweaty or light-headed. °· You have a cough with phlegm (sputum) or you cough up blood. °· You develop shortness of breath. °This information is not intended to replace advice given to you by your health care provider. Make sure you discuss any questions you have with your health care provider. °Document Released: 09/20/2005 Document Revised: 01/29/2016 Document Reviewed: 12/16/2014 °Elsevier Interactive Patient Education © 2017 [employer] Inc[employer] ° °

## 2017-02-18 NOTE — Telephone Encounter (Signed)
FYI, pt was placed on your schedule today.

## 2017-02-19 LAB — T3, FREE: T3, Free: 3 pg/mL (ref 2.3–4.2)

## 2017-02-19 LAB — TSH: TSH: 1.06 mIU/L

## 2017-02-19 LAB — T4, FREE: Free T4: 1.1 ng/dL (ref 0.8–1.8)

## 2017-02-22 ENCOUNTER — Telehealth (HOSPITAL_COMMUNITY): Payer: Self-pay | Admitting: Family Medicine

## 2017-03-01 NOTE — Telephone Encounter (Signed)
02/22/2017 10:22 AM Phone (Outgoing) Sabra Heck, [name] (Self) [phone] (H)   Left Message - Called pt and lmsg for her to CB to get scheduled for echo and myoview.     By Cherie Dark A    02/25/2017 09:22 AM Phone (Outgoing) Sabra Heck, [name] (Self) [phone] (M)   Left Message - Called pt and lmsg for her to CB.     By Cherie Dark

## 2017-03-04 ENCOUNTER — Telehealth (HOSPITAL_COMMUNITY): Payer: Self-pay | Admitting: Family Medicine

## 2017-03-04 NOTE — Telephone Encounter (Signed)
Close encounter 

## 2017-03-10 ENCOUNTER — Telehealth (HOSPITAL_COMMUNITY): Payer: Self-pay | Admitting: *Deleted

## 2017-03-10 NOTE — Telephone Encounter (Signed)
Left message on voicemail per DPR in reference to upcoming appointment scheduled on 03/15/17 at 0715 with detailed instructions given per Myocardial Perfusion Study Information Sheet for the test. LM to arrive 15 minutes early, and that it is imperative to arrive on time for appointment to keep from having the test rescheduled. If you need to cancel or reschedule your appointment, please call the office within 24 hours of your appointment. Failure to do so may result in a cancellation of your appointment, and a $50 no show fee. Phone number given for call back for any questions.

## 2017-03-15 ENCOUNTER — Ambulatory Visit (HOSPITAL_COMMUNITY): Payer: Medicare Other | Attending: Cardiology

## 2017-03-15 ENCOUNTER — Encounter: Payer: Self-pay | Admitting: Gastroenterology

## 2017-03-15 ENCOUNTER — Other Ambulatory Visit: Payer: Self-pay

## 2017-03-15 ENCOUNTER — Ambulatory Visit (HOSPITAL_BASED_OUTPATIENT_CLINIC_OR_DEPARTMENT_OTHER): Payer: Medicare Other

## 2017-03-15 DIAGNOSIS — R079 Chest pain, unspecified: Secondary | ICD-10-CM | POA: Diagnosis not present

## 2017-03-15 DIAGNOSIS — I361 Nonrheumatic tricuspid (valve) insufficiency: Secondary | ICD-10-CM | POA: Diagnosis not present

## 2017-03-15 LAB — MYOCARDIAL PERFUSION IMAGING
CHL CUP NUCLEAR SRS: 5
CHL CUP NUCLEAR SSS: 6
CHL CUP RESTING HR STRESS: 62 {beats}/min
LV dias vol: 77 mL (ref 46–106)
LV sys vol: 28 mL
NUC STRESS TID: 1.13
Peak HR: 90 {beats}/min
RATE: 0.22
SDS: 1

## 2017-03-15 MED ORDER — REGADENOSON 0.4 MG/5ML IV SOLN
0.4000 mg | Freq: Once | INTRAVENOUS | Status: AC
  Administered 2017-03-15: 0.4 mg via INTRAVENOUS

## 2017-03-15 MED ORDER — TECHNETIUM TC 99M TETROFOSMIN IV KIT
30.5000 | PACK | Freq: Once | INTRAVENOUS | Status: AC | PRN
  Administered 2017-03-15: 30.5 via INTRAVENOUS
  Filled 2017-03-15: qty 31

## 2017-03-15 MED ORDER — TECHNETIUM TC 99M TETROFOSMIN IV KIT
10.2000 | PACK | Freq: Once | INTRAVENOUS | Status: AC | PRN
  Administered 2017-03-15: 10.2 via INTRAVENOUS
  Filled 2017-03-15: qty 11

## 2017-03-16 DIAGNOSIS — Z85828 Personal history of other malignant neoplasm of skin: Secondary | ICD-10-CM | POA: Diagnosis not present

## 2017-03-16 DIAGNOSIS — Z85820 Personal history of malignant melanoma of skin: Secondary | ICD-10-CM | POA: Diagnosis not present

## 2017-03-16 DIAGNOSIS — L570 Actinic keratosis: Secondary | ICD-10-CM | POA: Diagnosis not present

## 2017-03-16 DIAGNOSIS — C44311 Basal cell carcinoma of skin of nose: Secondary | ICD-10-CM | POA: Diagnosis not present

## 2017-03-16 DIAGNOSIS — Z08 Encounter for follow-up examination after completed treatment for malignant neoplasm: Secondary | ICD-10-CM | POA: Diagnosis not present

## 2017-03-16 DIAGNOSIS — D485 Neoplasm of uncertain behavior of skin: Secondary | ICD-10-CM | POA: Diagnosis not present

## 2017-03-29 ENCOUNTER — Encounter: Payer: Self-pay | Admitting: Gastroenterology

## 2017-03-29 ENCOUNTER — Ambulatory Visit (AMBULATORY_SURGERY_CENTER): Payer: Medicare Other | Admitting: Gastroenterology

## 2017-03-29 VITALS — BP 141/76 | HR 60 | Temp 98.0°F | Resp 13 | Ht 66.0 in | Wt 197.0 lb

## 2017-03-29 DIAGNOSIS — K921 Melena: Secondary | ICD-10-CM

## 2017-03-29 DIAGNOSIS — D125 Benign neoplasm of sigmoid colon: Secondary | ICD-10-CM | POA: Diagnosis not present

## 2017-03-29 DIAGNOSIS — D124 Benign neoplasm of descending colon: Secondary | ICD-10-CM | POA: Diagnosis not present

## 2017-03-29 DIAGNOSIS — D123 Benign neoplasm of transverse colon: Secondary | ICD-10-CM

## 2017-03-29 DIAGNOSIS — Z86010 Personal history of colonic polyps: Secondary | ICD-10-CM

## 2017-03-29 DIAGNOSIS — D120 Benign neoplasm of cecum: Secondary | ICD-10-CM | POA: Diagnosis not present

## 2017-03-29 DIAGNOSIS — K635 Polyp of colon: Secondary | ICD-10-CM

## 2017-03-29 MED ORDER — SODIUM CHLORIDE 0.9 % IV SOLN: 500.0000 mL | INTRAVENOUS | Status: DC

## 2017-03-29 NOTE — Progress Notes (Signed)
Report to PACU, RN, vss, BBS= Clear.  

## 2017-03-29 NOTE — Progress Notes (Signed)
Called to room to assist during endoscopic procedure.  Patient ID and intended procedure confirmed with present staff. Received instructions for my participation in the procedure from the performing physician.  

## 2017-03-29 NOTE — Patient Instructions (Signed)
Impression/Recommendations:  Polyp handout given to patient. Diverticulosis handout given to patient.  Repeat colonoscopy in 5 years for surveillance.  Resume previous diet. Continue present medications.  YOU HAD AN ENDOSCOPIC PROCEDURE TODAY AT THE [facility]:   Refer to the procedure report that was given to you for any specific questions about what was found during the examination.  If the procedure report does not answer your questions, please call your gastroenterologist to clarify.  If you requested that your care partner not be given the details of your procedure findings, then the procedure report has been included in a sealed envelope for you to review at your convenience later.  YOU SHOULD EXPECT: Some feelings of bloating in the abdomen. Passage of more gas than usual.  Walking can help get rid of the air that was put into your GI tract during the procedure and reduce the bloating. If you had a lower endoscopy (such as a colonoscopy or flexible sigmoidoscopy) you may notice spotting of blood in your stool or on the toilet paper. If you underwent a bowel prep for your procedure, you may not have a normal bowel movement for a few days.  Please Note:  You might notice some irritation and congestion in your nose or some drainage.  This is from the oxygen used during your procedure.  There is no need for concern and it should clear up in a day or so.  SYMPTOMS TO REPORT IMMEDIATELY:   Following lower endoscopy (colonoscopy or flexible sigmoidoscopy):  Excessive amounts of blood in the stool  Significant tenderness or worsening of abdominal pains  Swelling of the abdomen that is new, acute  Fever of 100F or higher   For urgent or emergent issues, a gastroenterologist can be reached at any hour by calling [phone].   DIET:  We do recommend a small meal at first, but then you may proceed to your regular diet.  Drink plenty of fluids but you should avoid alcoholic  beverages for 24 hours.  ACTIVITY:  You should plan to take it easy for the rest of today and you should NOT DRIVE or use heavy machinery until tomorrow (because of the sedation medicines used during the test).    FOLLOW UP: Our staff will call the number listed on your records the next business day following your procedure to check on you and address any questions or concerns that you may have regarding the information given to you following your procedure. If we do not reach you, we will leave a message.  However, if you are feeling well and you are not experiencing any problems, there is no need to return our call.  We will assume that you have returned to your regular daily activities without incident.  If any biopsies were taken you will be contacted by phone or by letter within the next 1-3 weeks.  Please call us at [phone] if you have not heard about the biopsies in 3 weeks.    SIGNATURES/CONFIDENTIALITY: You and/or your care partner have signed paperwork which will be entered into your electronic medical record.  These signatures attest to the fact that that the information above on your After Visit Summary has been reviewed and is understood.  Full responsibility of the confidentiality of this discharge information lies with you and/or your care-partner. 

## 2017-03-29 NOTE — Op Note (Signed)
[facility] Patient Name: [name] [name] Procedure Date: 03/29/2017 8:57 AM MRN: [id number] Endoscopist: Ladene Artist , MD Age: 66 Referring MD:  Date of Birth: 09-30-1951 Gender: Female Account #: [id number] Procedure:                Colonoscopy Indications:              Surveillance: Personal history of colonic polyps                            (unknown histology) on last colonoscopy more than 3                            years ago Medicines:                Monitored Anesthesia Care Procedure:                Pre-Anesthesia Assessment:                           - Prior to the procedure, a History and Physical                            was performed, and patient medications and                            allergies were reviewed. The patient's tolerance of                            previous anesthesia was also reviewed. The risks                            and benefits of the procedure and the sedation                            options and risks were discussed with the patient.                            All questions were answered, and informed consent                            was obtained. Prior Anticoagulants: The patient has                            taken no previous anticoagulant or antiplatelet                            agents. ASA Grade Assessment: II - A patient with                            mild systemic disease. After reviewing the risks                            and benefits, the patient was deemed in  satisfactory condition to undergo the procedure.                           After obtaining informed consent, the colonoscope                            was passed under direct vision. Throughout the                            procedure, the patient's blood pressure, pulse, and                            oxygen saturations were monitored continuously. The                            Model PCF-[id number]) scope was  introduced                            through the anus and advanced to the the cecum,                            identified by appendiceal orifice and ileocecal                            valve. The ileocecal valve, appendiceal orifice,                            and rectum were photographed. The quality of the                            bowel preparation was excellent. The colonoscopy                            was performed without difficulty. The patient                            tolerated the procedure well. Scope In: 9:02:17 AM Scope Out: 9:15:37 AM Scope Withdrawal Time: 0 hours 11 minutes 29 seconds  Total Procedure Duration: 0 hours 13 minutes 20 seconds  Findings:                 The perianal and digital rectal examinations were                            normal.                           A 4 mm polyp was found in the sigmoid colon. The                            polyp was sessile. The polyp was removed with a                            cold biopsy forceps. Resection and retrieval were  complete.                           Three sessile polyps were found in the descending                            colon, transverse colon and cecum. The polyps were                            5 mm in size. These polyps were removed with a cold                            snare. Resection and retrieval were complete.                           A few medium-mouthed diverticula were found in the                            sigmoid colon.                           The exam was otherwise without abnormality on                            direct and retroflexion views. Complications:            No immediate complications. Estimated blood loss:                            None. Estimated Blood Loss:     Estimated blood loss: none. Impression:               - One 4 mm polyp in the sigmoid colon, removed with                            a cold biopsy forceps. Resected and retrieved.                            - Three 5 mm polyps in the descending colon, in the                            transverse colon and in the cecum, removed with a                            cold snare. Resected and retrieved.                           - Diverticulosis in the sigmoid colon.                           - The examination was otherwise normal on direct                            and retroflexion views. Recommendation:           - Repeat  colonoscopy in 5 years for surveillance.                           - Patient has a contact number available for                            emergencies. The signs and symptoms of potential                            delayed complications were discussed with the                            patient. Return to normal activities tomorrow.                            Written discharge instructions were provided to the                            patient.                           - Resume previous diet.                           - Continue present medications.                           - Await pathology results. Ladene Artist, MD 03/29/2017 9:18:42 AM This report has been signed electronically.

## 2017-03-29 NOTE — Progress Notes (Signed)
Pt's states no medical or surgical changes since previsit or office visit. 

## 2017-03-30 ENCOUNTER — Telehealth: Payer: Self-pay

## 2017-03-30 ENCOUNTER — Other Ambulatory Visit: Payer: Self-pay | Admitting: Family Medicine

## 2017-03-30 NOTE — Telephone Encounter (Signed)
Left message on answering machine. 

## 2017-04-10 ENCOUNTER — Encounter: Payer: Self-pay | Admitting: Gastroenterology

## 2017-04-20 ENCOUNTER — Other Ambulatory Visit: Payer: Self-pay | Admitting: Family Medicine

## 2017-04-21 ENCOUNTER — Encounter: Payer: Self-pay | Admitting: Family Medicine

## 2017-04-21 MED ORDER — NP THYROID 60 MG PO TABS: 60.0000 mg | ORAL_TABLET | Freq: Every day | ORAL | 3 refills | Status: DC

## 2017-05-01 ENCOUNTER — Encounter: Payer: Self-pay | Admitting: Family Medicine

## 2017-05-04 ENCOUNTER — Encounter: Payer: Self-pay | Admitting: Family Medicine

## 2017-05-04 ENCOUNTER — Other Ambulatory Visit: Payer: Self-pay | Admitting: General Practice

## 2017-05-04 ENCOUNTER — Ambulatory Visit (INDEPENDENT_AMBULATORY_CARE_PROVIDER_SITE_OTHER): Payer: Medicare Other | Admitting: Family Medicine

## 2017-05-04 VITALS — BP 123/82 | HR 85 | Temp 98.0°F | Resp 16 | Ht 66.0 in | Wt 201.5 lb

## 2017-05-04 DIAGNOSIS — J0100 Acute maxillary sinusitis, unspecified: Secondary | ICD-10-CM

## 2017-05-04 DIAGNOSIS — E785 Hyperlipidemia, unspecified: Secondary | ICD-10-CM | POA: Diagnosis not present

## 2017-05-04 DIAGNOSIS — E038 Other specified hypothyroidism: Secondary | ICD-10-CM

## 2017-05-04 LAB — CBC WITH DIFFERENTIAL/PLATELET
BASOS ABS: 0 10*3/uL (ref 0.0–0.1)
Basophils Relative: 0.7 % (ref 0.0–3.0)
EOS PCT: 1.4 % (ref 0.0–5.0)
Eosinophils Absolute: 0.1 10*3/uL (ref 0.0–0.7)
HEMATOCRIT: 37.3 % (ref 36.0–46.0)
HEMOGLOBIN: 12.3 g/dL (ref 12.0–15.0)
Lymphocytes Relative: 25 % (ref 12.0–46.0)
Lymphs Abs: 1.3 10*3/uL (ref 0.7–4.0)
MCHC: 32.9 g/dL (ref 30.0–36.0)
MCV: 85.6 fl (ref 78.0–100.0)
Monocytes Absolute: 0.6 10*3/uL (ref 0.1–1.0)
Monocytes Relative: 11.1 % (ref 3.0–12.0)
NEUTROS ABS: 3.2 10*3/uL (ref 1.4–7.7)
Neutrophils Relative %: 61.8 % (ref 43.0–77.0)
PLATELETS: 227 10*3/uL (ref 150.0–400.0)
RBC: 4.36 Mil/uL (ref 3.87–5.11)
RDW: 14.7 % (ref 11.5–15.5)
WBC: 5.1 10*3/uL (ref 4.0–10.5)

## 2017-05-04 LAB — HEPATIC FUNCTION PANEL
ALT: 25 U/L (ref 0–35)
AST: 23 U/L (ref 0–37)
Albumin: 4.3 g/dL (ref 3.5–5.2)
Alkaline Phosphatase: 87 U/L (ref 39–117)
BILIRUBIN TOTAL: 0.4 mg/dL (ref 0.2–1.2)
Bilirubin, Direct: 0.1 mg/dL (ref 0.0–0.3)
Total Protein: 6.8 g/dL (ref 6.0–8.3)

## 2017-05-04 LAB — BASIC METABOLIC PANEL
BUN: 18 mg/dL (ref 6–23)
CHLORIDE: 105 meq/L (ref 96–112)
CO2: 28 mEq/L (ref 19–32)
Calcium: 9.3 mg/dL (ref 8.4–10.5)
Creatinine, Ser: 0.76 mg/dL (ref 0.40–1.20)
GFR: 81.07 mL/min (ref >60.00)
Glucose, Bld: 102 mg/dL — ABNORMAL HIGH (ref 70–99)
POTASSIUM: 4.1 meq/L (ref 3.5–5.1)
Sodium: 139 mEq/L (ref 135–145)

## 2017-05-04 LAB — LIPID PANEL
CHOL/HDL RATIO: 2
Cholesterol: 152 mg/dL (ref 0–200)
HDL: 63.1 mg/dL (ref >39.00)
LDL Cholesterol: 74 mg/dL (ref 0–99)
NONHDL: 88.57
TRIGLYCERIDES: 73 mg/dL (ref 0.0–149.0)
VLDL: 14.6 mg/dL (ref 0.0–40.0)

## 2017-05-04 LAB — TSH: TSH: 1.89 u[IU]/mL (ref 0.35–4.50)

## 2017-05-04 MED ORDER — AMOXICILLIN 875 MG PO TABS: 875.0000 mg | ORAL_TABLET | Freq: Two times a day (BID) | ORAL | 0 refills | Status: DC

## 2017-05-04 MED ORDER — PROMETHAZINE-DM 6.25-15 MG/5ML PO SYRP: 5.0000 mL | ORAL_SOLUTION | Freq: Four times a day (QID) | ORAL | 0 refills | Status: DC | PRN

## 2017-05-04 MED ORDER — NP THYROID 60 MG PO TABS: 60.0000 mg | ORAL_TABLET | Freq: Every day | ORAL | 1 refills | Status: DC

## 2017-05-04 NOTE — Progress Notes (Signed)
Pre visit review using our clinic review tool, if applicable. No additional management support is needed unless otherwise documented below in the visit note. 

## 2017-05-04 NOTE — Patient Instructions (Addendum)
Schedule your complete physical in 6 months and your Annual Wellness Visit w/ [facility] notify you of your lab results and make any changes if needed Start the Amoxicillin twice daily- take w/ food Drink plenty of fluids Use the cough syrup as needed- may cause drowsiness Call with any questions or concerns Feel Better!!!

## 2017-05-04 NOTE — Progress Notes (Signed)
   Subjective:    Patient ID: [name] [name], female    DOB: [birth year], 66 y.o.   MRN: [id number]  HPI Hyperlipidemia- chronic problem, on Simvastatin 20mg  daily.  Pt has gained 5 lbs since last visit.  No CP, SOB, HAs, visual changes, edema.  Hypothyroid- chronic problem, on NP thyroid 60mg  daily.  Denies excessive fatigue, changes to skin/hair/nails  Cough- sxs started Monday w/ sore throat.  Tuesday throat worsened and cough started last night.  No fevers.  No sinus pain/pressure.  No nasal congestion.  No known sick contacts.   Review of Systems For ROS see HPI     Objective:   Physical Exam  Constitutional: She appears well-developed and well-nourished. No distress.  HENT:  Head: Normocephalic and atraumatic.  Right Ear: Tympanic membrane normal.  Left Ear: Tympanic membrane normal.  Nose: Mucosal edema and rhinorrhea present. Right sinus exhibits maxillary sinus tenderness. Right sinus exhibits no frontal sinus tenderness. Left sinus exhibits maxillary sinus tenderness. Left sinus exhibits no frontal sinus tenderness.  Mouth/Throat: Uvula is midline and mucous membranes are normal. Posterior oropharyngeal erythema present. No oropharyngeal exudate.  Eyes: Pupils are equal, round, and reactive to light. Conjunctivae and EOM are normal.  Neck: Normal range of motion. Neck supple.  Cardiovascular: Normal rate, regular rhythm and normal heart sounds.   Pulmonary/Chest: Effort normal and breath sounds normal. No respiratory distress. She has no wheezes.  Lymphadenopathy:    She has no cervical adenopathy.  Vitals reviewed.         Assessment & Plan:  Maxillary sinusitis- new.  Pt has hacking cough and TTP over maxillary sinuses.  Start amox.  Reviewed supportive care and red flags that should prompt return.  Pt expressed understanding and is in agreement w/ plan.

## 2017-05-04 NOTE — Assessment & Plan Note (Signed)
Chronic problem.  Tolerating statin w/o difficulty.  Check labs.  Adjust meds prn  

## 2017-05-04 NOTE — Assessment & Plan Note (Signed)
Chronic problem.  Currently asymptomatic.  Check labs.  Adjust meds prn  

## 2017-05-12 ENCOUNTER — Encounter: Payer: Self-pay | Admitting: Family Medicine

## 2017-05-12 MED ORDER — NP THYROID 60 MG PO TABS: 60.0000 mg | ORAL_TABLET | Freq: Every day | ORAL | 1 refills | Status: DC

## 2017-05-17 DIAGNOSIS — C44311 Basal cell carcinoma of skin of nose: Secondary | ICD-10-CM | POA: Diagnosis not present

## 2017-06-02 ENCOUNTER — Encounter: Payer: Self-pay | Admitting: Family Medicine

## 2017-06-02 ENCOUNTER — Other Ambulatory Visit: Payer: Self-pay | Admitting: General Practice

## 2017-06-02 MED ORDER — NP THYROID 60 MG PO TABS: 60.0000 mg | ORAL_TABLET | Freq: Every day | ORAL | 1 refills | Status: DC

## 2017-06-30 DIAGNOSIS — Z08 Encounter for follow-up examination after completed treatment for malignant neoplasm: Secondary | ICD-10-CM | POA: Diagnosis not present

## 2017-06-30 DIAGNOSIS — Z85820 Personal history of malignant melanoma of skin: Secondary | ICD-10-CM | POA: Diagnosis not present

## 2017-06-30 DIAGNOSIS — Z85828 Personal history of other malignant neoplasm of skin: Secondary | ICD-10-CM | POA: Diagnosis not present

## 2017-06-30 DIAGNOSIS — L570 Actinic keratosis: Secondary | ICD-10-CM | POA: Diagnosis not present

## 2017-06-30 DIAGNOSIS — L438 Other lichen planus: Secondary | ICD-10-CM | POA: Diagnosis not present

## 2017-06-30 DIAGNOSIS — D485 Neoplasm of uncertain behavior of skin: Secondary | ICD-10-CM | POA: Diagnosis not present

## 2017-06-30 DIAGNOSIS — D0471 Carcinoma in situ of skin of right lower limb, including hip: Secondary | ICD-10-CM | POA: Diagnosis not present

## 2017-10-04 HISTORY — PX: TOENAIL EXCISION: SUR558

## 2017-10-12 DIAGNOSIS — L820 Inflamed seborrheic keratosis: Secondary | ICD-10-CM | POA: Diagnosis not present

## 2017-10-12 DIAGNOSIS — Z85820 Personal history of malignant melanoma of skin: Secondary | ICD-10-CM | POA: Diagnosis not present

## 2017-10-12 DIAGNOSIS — L718 Other rosacea: Secondary | ICD-10-CM | POA: Diagnosis not present

## 2017-10-12 DIAGNOSIS — L570 Actinic keratosis: Secondary | ICD-10-CM | POA: Diagnosis not present

## 2017-10-12 DIAGNOSIS — B009 Herpesviral infection, unspecified: Secondary | ICD-10-CM | POA: Diagnosis not present

## 2017-10-26 ENCOUNTER — Other Ambulatory Visit: Payer: Self-pay | Admitting: Family Medicine

## 2017-11-08 NOTE — Progress Notes (Addendum)
Subjective:   [name] [name] is a 67 y.o. female who presents for Medicare Annual (Subsequent) preventive examination.  Review of Systems:  No ROS.  Medicare Wellness Visit. Additional risk factors are reflected in the social history.  Cardiac Risk Factors include: advanced age (>89men, >5 women);dyslipidemia;family history of premature cardiovascular disease;obesity (BMI >30kg/m2)   Sleep patterns: Sleeps 8 hours, interrupted. Increased snoring x 2 months.  Home Safety/Smoke Alarms: Feels safe in home. Smoke alarms in place.  Living environment; residence and Firearm Safety: Lives with husband in 2 story.  Seat Belt Safety/Bike Helmet: Wears seat belt.   Female:   Pap-2017      Mammo-10/04/2016, pt reports normal. [facility]. Report requested.  Dexa scan-11/27/2014, Osteopenia. Ordered today, [place].       CCS-Colonoscopy 03/29/2017, polyps. Recall 5 years.      Objective:     Vitals: BP 138/88 (BP Location: Left Arm, Patient Position: Sitting, Cuff Size: Normal)   Pulse 76   Temp 98 F (36.7 C) (Temporal)   Resp 18   Ht 5\' 6"  (1.676 m)   Wt 208 lb 9.6 oz (94.6 kg)   SpO2 98%   BMI 33.67 kg/m   Body mass index is 33.67 kg/m.  Advanced Directives 11/09/2017 01/05/2017 08/21/2015  Does Patient Have a Medical Advance Directive? Yes No Yes  Type of Paramedic of [place];Living will - [place];Living will  Does patient want to make changes to medical advance directive? - - No - Patient declined  Copy of [place] in Chart? No - copy requested - No - copy requested    Tobacco Social History   Tobacco Use  Smoking Status Former Smoker  . Years: 6.00  . Last attempt to quit: 10/05/1975  . Years since quitting: 42.1  Smokeless Tobacco Never Used     Counseling given: Not Answered   Past Medical History:  Diagnosis Date  . Allergy, unspecified not elsewhere classified   .  Borderline hypertension   . Bronchitis   . DJD (degenerative joint disease)   . GERD (gastroesophageal reflux disease)   . History of skin cancer   . Hypercholesterolemia   . Hypothyroidism   . IBS (irritable bowel syndrome)   . PONV (postoperative nausea and vomiting)   . Postoperative nausea and vomiting 09/02/2015  . Primary localized osteoarthritis of left knee   . Skin cancer (melanoma) (HCC)    basil cell   . Vitamin D deficiency    Past Surgical History:  Procedure Laterality Date  . BUNIONECTOMY  2004   left  . COLONOSCOPY    . FOOT OSTEOTOMY  2003   right  . HERNIA REPAIR  1957  . KNEE ARTHROSCOPY  1987&2003   left  . MELANOMA EXCISION Right    shoulder  . SHOULDER ARTHROSCOPY WITH OPEN ROTATOR CUFF REPAIR  2013   right  . SKIN SURGERY  4/05   Melanoma surg left shoulder  . TOTAL KNEE ARTHROPLASTY Left 09/01/2015   Procedure: TOTAL KNEE ARTHROPLASTY;  Surgeon: Elsie Saas, MD;  Location: [place];  Service: Orthopedics;  Laterality: Left;  . TUBAL LIGATION  1990   Family History  Problem Relation Age of Onset  . Colon polyps Father   . Cancer Father        laryngeal  . Diabetes Son   . Colon cancer Neg Hx   . Stomach cancer Neg Hx    Social History  Socioeconomic History  . Marital status: Married    Spouse name: Marya Amsler [phone]  . Number of children: 2  . Years of education: None  . Highest education level: None  Social Needs  . Financial resource strain: None  . Food insecurity - worry: None  . Food insecurity - inability: None  . Transportation needs - medical: None  . Transportation needs - non-medical: None  Occupational History  . None  Tobacco Use  . Smoking status: Former Smoker    Years: 6.00    Last attempt to quit: 10/05/1975    Years since quitting: 42.1  . Smokeless tobacco: Never Used  Substance and Sexual Activity  . Alcohol use: Yes    Alcohol/week: 6.0 oz    Types: 12 Standard drinks or equivalent per week    Comment:  socially  . Drug use: No  . Sexual activity: None  Other Topics Concern  . None  Social History Narrative  . None    Outpatient Encounter Medications as of 11/09/2017  Medication Sig  . aspirin 325 MG tablet Take 325 mg by mouth daily.  . cetirizine (ZYRTEC) 10 MG tablet Take 10 mg by mouth daily.  . Cholecalciferol (VITAMIN D3) 5000 UNITS CAPS Take 5,000 Units by mouth daily.   Marland Kitchen doxycycline (ADOXA) 50 MG tablet Take 50 mg by mouth daily.  . Magnesium 200 MG TABS Take 200 mg by mouth daily.  Marland Kitchen MELATONIN PO Take 3 mg by mouth at bedtime as needed and may repeat dose one time if needed.  . Multiple Vitamins-Minerals (MULTIVITAMIN PO) Take 1 tablet by mouth daily.  . NP THYROID 60 MG tablet Take 1 tablet (60 mg total) by mouth daily before breakfast.  . omeprazole (PRILOSEC) 20 MG capsule TAKE 1 CAPSULE BY MOUTH  DAILY  . simvastatin (ZOCOR) 20 MG tablet TAKE 1 TABLET BY MOUTH AT  BEDTIME  . Urea 40 % GEL   . [DISCONTINUED] amoxicillin (AMOXIL) 875 MG tablet Take 1 tablet (875 mg total) by mouth 2 (two) times daily. (Patient not taking: Reported on 11/09/2017)  . [DISCONTINUED] promethazine-dextromethorphan (PROMETHAZINE-DM) 6.25-15 MG/5ML syrup Take 5 mLs by mouth 4 (four) times daily as needed. (Patient not taking: Reported on 11/09/2017)   No facility-administered encounter medications on file as of 11/09/2017.     Activities of Daily Living In your present state of health, do you have any difficulty performing the following activities: 11/09/2017 05/04/2017  Hearing? N N  Vision? N N  Difficulty concentrating or making decisions? N N  Walking or climbing stairs? N N  Dressing or bathing? N N  Doing errands, shopping? N N  Preparing Food and eating ? N -  Using the Toilet? N -  In the past six months, have you accidently leaked urine? N -  Do you have problems with loss of bowel control? N -  Managing your Medications? N -  Managing your Finances? N -  Housekeeping or managing your  Housekeeping? N -  Some recent data might be hidden    Patient Care Team: Midge Minium, MD as PCP - General (Family Medicine) Roque Cash., MD as Consulting Physician (Obstetrics and Gynecology) [facility], Katharine Look, MD as Referring Physician (Dermatology) Elsie Saas, MD as Consulting Physician (Orthopedic Surgery) [place], [place], [place] as Consulting Physician (Optometry) [place], Tamala Fothergill, DPM as Consulting Physician (Podiatry) Janann August, MD as Referring Physician (Dermatology)    Assessment:   This is a routine wellness examination for [name] [name].  Exercise Activities and Dietary recommendations Current Exercise Habits: Home exercise routine(5-6k steps/day), Type of exercise: walking, Exercise limited by: None identified   Diet (meal preparation, eat out, water intake, caffeinated beverages, dairy products, fruits and vegetables): Drinks water, beer/wine.   Breakfast: raisin bran; bagel/cream cheese Lunch: sandwich, salad Dinner: protein and vegetables; soups     Goals    . Weight (lb) < 200 lb (90.7 kg)     Lose weight by cutting out carb/sugars in diet.        Fall Risk Fall Risk  11/09/2017 05/04/2017 05/30/2015 11/22/2014  Falls in the past year? No No No No    Depression Screen PHQ 2/9 Scores 11/09/2017 05/04/2017 11/04/2016 05/30/2015  PHQ - 2 Score 0 0 0 0  PHQ- 9 Score - 0 0 -     Cognitive Function MMSE - Mini Mental State Exam 11/09/2017  Orientation to time 5  Orientation to Place 5  Registration 3  Attention/ Calculation 5  Recall 3  Language- name 2 objects 2  Language- repeat 1  Language- follow 3 step command 3  Language- read & follow direction 1  Write a sentence 1  Copy design 1  Total score 30        Immunization History  Administered Date(s) Administered  . Influenza Split 08/04/2012  . Influenza,inj,Quad PF,6+ Mos 11/22/2014, 07/01/2015, 06/04/2016, 07/25/2017  . Tdap 10/01/2011  . Zoster 12/03/2015  . Zoster Recombinat  (Shingrix) 07/25/2017      Screening Tests Health Maintenance  Topic Date Due  . MAMMOGRAM  10/04/2017  . PNA vac Low Risk Adult (1 of 2 - PCV13) 03/04/2018 (Originally 11/26/2016)  . HIV Screening  11/09/2018 (Originally 11/26/1965)  . PAP SMEAR  08/13/2019  . TETANUS/TDAP  09/30/2021  . COLONOSCOPY  03/29/2022  . INFLUENZA VACCINE  Completed  . DEXA SCAN  Completed  . Hepatitis C Screening  Completed       Plan:     Schedule bone scan.   Pneumonia vaccine?  Bring a copy of your living will and/or healthcare power of attorney to your next office visit.  Continue doing brain stimulating activities (puzzles, reading, adult coloring books, staying active) to keep memory sharp.   I have personally reviewed and noted the following in the patient's chart:   . Medical and social history . Use of alcohol, tobacco or illicit drugs  . Current medications and supplements . Functional ability and status . Nutritional status . Physical activity . Advanced directives . List of other physicians . Hospitalizations, surgeries, and ER visits in previous 12 months . Vitals . Screenings to include cognitive, depression, and falls . Referrals and appointments  In addition, I have reviewed and discussed with patient certain preventive protocols, quality metrics, and best practice recommendations. A written personalized care plan for preventive services as well as general preventive health recommendations were provided to patient.     Gerilyn Nestle, RN  11/09/2017  Reviewed documentation provided by RN and agree w/ above.  Annye Asa, MD

## 2017-11-09 ENCOUNTER — Other Ambulatory Visit: Payer: Self-pay

## 2017-11-09 ENCOUNTER — Ambulatory Visit (INDEPENDENT_AMBULATORY_CARE_PROVIDER_SITE_OTHER): Payer: Medicare HMO | Admitting: Family Medicine

## 2017-11-09 ENCOUNTER — Encounter: Payer: Self-pay | Admitting: General Practice

## 2017-11-09 ENCOUNTER — Encounter: Payer: Self-pay | Admitting: Family Medicine

## 2017-11-09 ENCOUNTER — Ambulatory Visit (INDEPENDENT_AMBULATORY_CARE_PROVIDER_SITE_OTHER): Payer: Medicare HMO

## 2017-11-09 VITALS — BP 138/88 | HR 76 | Temp 98.0°F | Resp 18 | Ht 66.0 in | Wt 208.4 lb

## 2017-11-09 VITALS — BP 138/88 | HR 76 | Temp 98.0°F | Resp 18 | Ht 66.0 in | Wt 208.6 lb

## 2017-11-09 DIAGNOSIS — B9689 Other specified bacterial agents as the cause of diseases classified elsewhere: Secondary | ICD-10-CM

## 2017-11-09 DIAGNOSIS — E559 Vitamin D deficiency, unspecified: Secondary | ICD-10-CM | POA: Diagnosis not present

## 2017-11-09 DIAGNOSIS — E2839 Other primary ovarian failure: Secondary | ICD-10-CM

## 2017-11-09 DIAGNOSIS — E038 Other specified hypothyroidism: Secondary | ICD-10-CM

## 2017-11-09 DIAGNOSIS — R0683 Snoring: Secondary | ICD-10-CM

## 2017-11-09 DIAGNOSIS — Z0000 Encounter for general adult medical examination without abnormal findings: Secondary | ICD-10-CM

## 2017-11-09 DIAGNOSIS — J208 Acute bronchitis due to other specified organisms: Secondary | ICD-10-CM

## 2017-11-09 DIAGNOSIS — E785 Hyperlipidemia, unspecified: Secondary | ICD-10-CM

## 2017-11-09 DIAGNOSIS — Z0001 Encounter for general adult medical examination with abnormal findings: Secondary | ICD-10-CM | POA: Diagnosis not present

## 2017-11-09 LAB — BASIC METABOLIC PANEL
BUN: 17 mg/dL (ref 6–23)
CALCIUM: 9.4 mg/dL (ref 8.4–10.5)
CO2: 28 mEq/L (ref 19–32)
Chloride: 104 mEq/L (ref 96–112)
Creatinine, Ser: 0.76 mg/dL (ref 0.40–1.20)
GFR: 80.94 mL/min (ref >60.00)
Glucose, Bld: 97 mg/dL (ref 70–99)
Potassium: 3.9 mEq/L (ref 3.5–5.1)
SODIUM: 140 meq/L (ref 135–145)

## 2017-11-09 LAB — HEPATIC FUNCTION PANEL
ALT: 37 U/L — ABNORMAL HIGH (ref 0–35)
AST: 34 U/L (ref 0–37)
Albumin: 4.5 g/dL (ref 3.5–5.2)
Alkaline Phosphatase: 91 U/L (ref 39–117)
BILIRUBIN DIRECT: 0.1 mg/dL (ref 0.0–0.3)
BILIRUBIN TOTAL: 0.6 mg/dL (ref 0.2–1.2)
Total Protein: 7.2 g/dL (ref 6.0–8.3)

## 2017-11-09 LAB — CBC WITH DIFFERENTIAL/PLATELET
BASOS ABS: 0 10*3/uL (ref 0.0–0.1)
BASOS PCT: 0.6 % (ref 0.0–3.0)
EOS ABS: 0.1 10*3/uL (ref 0.0–0.7)
Eosinophils Relative: 2 % (ref 0.0–5.0)
HEMATOCRIT: 36.4 % (ref 36.0–46.0)
HEMOGLOBIN: 11.9 g/dL — AB (ref 12.0–15.0)
LYMPHS PCT: 31 % (ref 12.0–46.0)
Lymphs Abs: 1.9 10*3/uL (ref 0.7–4.0)
MCHC: 32.7 g/dL (ref 30.0–36.0)
MCV: 80.2 fl (ref 78.0–100.0)
MONOS PCT: 6.4 % (ref 3.0–12.0)
Monocytes Absolute: 0.4 10*3/uL (ref 0.1–1.0)
Neutro Abs: 3.7 10*3/uL (ref 1.4–7.7)
Neutrophils Relative %: 60 % (ref 43.0–77.0)
PLATELETS: 299 10*3/uL (ref 150.0–400.0)
RBC: 4.54 Mil/uL (ref 3.87–5.11)
RDW: 18.6 % — ABNORMAL HIGH (ref 11.5–15.5)
WBC: 6.1 10*3/uL (ref 4.0–10.5)

## 2017-11-09 LAB — LIPID PANEL
Cholesterol: 142 mg/dL (ref 0–200)
HDL: 53.5 mg/dL (ref >39.00)
LDL CALC: 73 mg/dL (ref 0–99)
NONHDL: 88.7
Total CHOL/HDL Ratio: 3
Triglycerides: 80 mg/dL (ref 0.0–149.0)
VLDL: 16 mg/dL (ref 0.0–40.0)

## 2017-11-09 LAB — VITAMIN D 25 HYDROXY (VIT D DEFICIENCY, FRACTURES): VITD: 45.55 ng/mL (ref 30.00–100.00)

## 2017-11-09 LAB — TSH: TSH: 1.78 u[IU]/mL (ref 0.35–4.50)

## 2017-11-09 MED ORDER — AZITHROMYCIN 250 MG PO TABS: ORAL_TABLET | ORAL | 0 refills | Status: DC

## 2017-11-09 NOTE — Patient Instructions (Addendum)
Schedule bone scan.   Pneumonia vaccine?  Bring a copy of your living will and/or healthcare power of attorney to your next office visit.  Continue doing brain stimulating activities (puzzles, reading, adult coloring books, staying active) to keep memory sharp.   Fall Prevention in the Home Falls can cause injuries. They can happen to people of all ages. There are many things you can do to make your home safe and to help prevent falls. What can I do on the outside of my home?  Regularly fix the edges of walkways and driveways and fix any cracks.  Remove anything that might make you trip as you walk through a door, such as a raised step or threshold.  Trim any bushes or trees on the path to your home.  Use bright outdoor lighting.  Clear any walking paths of anything that might make someone trip, such as rocks or tools.  Regularly check to see if handrails are loose or broken. Make sure that both sides of any steps have handrails.  Any raised decks and porches should have guardrails on the edges.  Have any leaves, snow, or ice cleared regularly.  Use sand or salt on walking paths during winter.  Clean up any spills in your garage right away. This includes oil or grease spills. What can I do in the bathroom?  Use night lights.  Install grab bars by the toilet and in the tub and shower. Do not use towel bars as grab bars.  Use non-skid mats or decals in the tub or shower.  If you need to sit down in the shower, use a plastic, non-slip stool.  Keep the floor dry. Clean up any water that spills on the floor as soon as it happens.  Remove soap buildup in the tub or shower regularly.  Attach bath mats securely with double-sided non-slip rug tape.  Do not have throw rugs and other things on the floor that can make you trip. What can I do in the bedroom?  Use night lights.  Make sure that you have a light by your bed that is easy to reach.  Do not use any sheets or  blankets that are too big for your bed. They should not hang down onto the floor.  Have a firm chair that has side arms. You can use this for support while you get dressed.  Do not have throw rugs and other things on the floor that can make you trip. What can I do in the kitchen?  Clean up any spills right away.  Avoid walking on wet floors.  Keep items that you use a lot in easy-to-reach places.  If you need to reach something above you, use a strong step stool that has a grab bar.  Keep electrical cords out of the way.  Do not use floor polish or wax that makes floors slippery. If you must use wax, use non-skid floor wax.  Do not have throw rugs and other things on the floor that can make you trip. What can I do with my stairs?  Do not leave any items on the stairs.  Make sure that there are handrails on both sides of the stairs and use them. Fix handrails that are broken or loose. Make sure that handrails are as long as the stairways.  Check any carpeting to make sure that it is firmly attached to the stairs. Fix any carpet that is loose or worn.  Avoid having throw rugs at the   top or bottom of the stairs. If you do have throw rugs, attach them to the floor with carpet tape.  Make sure that you have a light switch at the top of the stairs and the bottom of the stairs. If you do not have them, ask someone to add them for you. What else can I do to help prevent falls?  Wear shoes that: ? Do not have high heels. ? Have rubber bottoms. ? Are comfortable and fit you well. ? Are closed at the toe. Do not wear sandals.  If you use a stepladder: ? Make sure that it is fully opened. Do not climb a closed stepladder. ? Make sure that both sides of the stepladder are locked into place. ? Ask someone to hold it for you, if possible.  Clearly mark and make sure that you can see: ? Any grab bars or handrails. ? First and last steps. ? Where the edge of each step is.  Use tools  that help you move around (mobility aids) if they are needed. These include: ? Canes. ? Walkers. ? Scooters. ? Crutches.  Turn on the lights when you go into a dark area. Replace any light bulbs as soon as they burn out.  Set up your furniture so you have a clear path. Avoid moving your furniture around.  If any of your floors are uneven, fix them.  If there are any pets around you, be aware of where they are.  Review your medicines with your doctor. Some medicines can make you feel dizzy. This can increase your chance of falling. Ask your doctor what other things that you can do to help prevent falls. This information is not intended to replace advice given to you by your health care provider. Make sure you discuss any questions you have with your health care provider. Document Released: 07/17/2009 Document Revised: 02/26/2016 Document Reviewed: 10/25/2014 Elsevier Interactive Patient Education  2018 [place] Maintenance, Female Adopting a healthy lifestyle and getting preventive care can go a long way to promote health and wellness. Talk with your health care provider about what schedule of regular examinations is right for you. This is a good chance for you to check in with your provider about disease prevention and staying healthy. In between checkups, there are plenty of things you can do on your own. Experts have done a lot of research about which lifestyle changes and preventive measures are most likely to keep you healthy. Ask your health care provider for more information. Weight and diet Eat a healthy diet  Be sure to include plenty of vegetables, fruits, low-fat dairy products, and lean protein.  Do not eat a lot of foods high in solid fats, added sugars, or salt.  Get regular exercise. This is one of the most important things you can do for your health. ? Most adults should exercise for at least 150 minutes each week. The exercise should increase your heart  rate and make you sweat (moderate-intensity exercise). ? Most adults should also do strengthening exercises at least twice a week. This is in addition to the moderate-intensity exercise.  Maintain a healthy weight  Body mass index (BMI) is a measurement that can be used to identify possible weight problems. It estimates body fat based on height and weight. Your health care provider can help determine your BMI and help you achieve or maintain a healthy weight.  For females 31 years of age and older: ? A BMI below 18.5  is considered underweight. ? A BMI of 18.5 to 24.9 is normal. ? A BMI of 25 to 29.9 is considered overweight. ? A BMI of 30 and above is considered obese.  Watch levels of cholesterol and blood lipids  You should start having your blood tested for lipids and cholesterol at 66 years of age, then have this test every 5 years.  You may need to have your cholesterol levels checked more often if: ? Your lipid or cholesterol levels are high. ? You are older than 66 years of age. ? You are at high risk for heart disease.  Cancer screening Lung Cancer  Lung cancer screening is recommended for adults 55-80 years old who are at high risk for lung cancer because of a history of smoking.  A yearly low-dose CT scan of the lungs is recommended for people who: ? Currently smoke. ? Have quit within the past 15 years. ? Have at least a 30-pack-year history of smoking. A pack year is smoking an average of one pack of cigarettes a day for 1 year.  Yearly screening should continue until it has been 15 years since you quit.  Yearly screening should stop if you develop a health problem that would prevent you from having lung cancer treatment.  Breast Cancer  Practice breast self-awareness. This means understanding how your breasts normally appear and feel.  It also means doing regular breast self-exams. Let your health care provider know about any changes, no matter how small.  If  you are in your 20s or 30s, you should have a clinical breast exam (CBE) by a health care provider every 1-3 years as part of a regular health exam.  If you are 40 or older, have a CBE every year. Also consider having a breast X-ray (mammogram) every year.  If you have a family history of breast cancer, talk to your health care provider about genetic screening.  If you are at high risk for breast cancer, talk to your health care provider about having an MRI and a mammogram every year.  Breast cancer gene (BRCA) assessment is recommended for women who have family members with BRCA-related cancers. BRCA-related cancers include: ? Breast. ? Ovarian. ? Tubal. ? Peritoneal cancers.  Results of the assessment will determine the need for genetic counseling and BRCA1 and BRCA2 testing.  Cervical Cancer Your health care provider may recommend that you be screened regularly for cancer of the pelvic organs (ovaries, uterus, and vagina). This screening involves a pelvic examination, including checking for microscopic changes to the surface of your cervix (Pap test). You may be encouraged to have this screening done every 3 years, beginning at age 21.  For women ages 30-65, health care providers may recommend pelvic exams and Pap testing every 3 years, or they may recommend the Pap and pelvic exam, combined with testing for human papilloma virus (HPV), every 5 years. Some types of HPV increase your risk of cervical cancer. Testing for HPV may also be done on women of any age with unclear Pap test results.  Other health care providers may not recommend any screening for nonpregnant women who are considered low risk for pelvic cancer and who do not have symptoms. Ask your health care provider if a screening pelvic exam is right for you.  If you have had past treatment for cervical cancer or a condition that could lead to cancer, you need Pap tests and screening for cancer for at least 20 years after your  treatment.   If Pap tests have been discontinued, your risk factors (such as having a new sexual partner) need to be reassessed to determine if screening should resume. Some women have medical problems that increase the chance of getting cervical cancer. In these cases, your health care provider may recommend more frequent screening and Pap tests.  Colorectal Cancer  This type of cancer can be detected and often prevented.  Routine colorectal cancer screening usually begins at 66 years of age and continues through 66 years of age.  Your health care provider may recommend screening at an earlier age if you have risk factors for colon cancer.  Your health care provider may also recommend using home test kits to check for hidden blood in the stool.  A small camera at the end of a tube can be used to examine your colon directly (sigmoidoscopy or colonoscopy). This is done to check for the earliest forms of colorectal cancer.  Routine screening usually begins at age 50.  Direct examination of the colon should be repeated every 5-10 years through 66 years of age. However, you may need to be screened more often if early forms of precancerous polyps or small growths are found.  Skin Cancer  Check your skin from head to toe regularly.  Tell your health care provider about any new moles or changes in moles, especially if there is a change in a mole's shape or color.  Also tell your health care provider if you have a mole that is larger than the size of a pencil eraser.  Always use sunscreen. Apply sunscreen liberally and repeatedly throughout the day.  Protect yourself by wearing long sleeves, pants, a wide-brimmed hat, and sunglasses whenever you are outside.  Heart disease, diabetes, and high blood pressure  High blood pressure causes heart disease and increases the risk of stroke. High blood pressure is more likely to develop in: ? People who have blood pressure in the high end of the normal  range (130-139/85-89 mm Hg). ? People who are overweight or obese. ? People who are African American.  If you are 18-39 years of age, have your blood pressure checked every 3-5 years. If you are 40 years of age or older, have your blood pressure checked every year. You should have your blood pressure measured twice-once when you are at a hospital or clinic, and once when you are not at a hospital or clinic. Record the average of the two measurements. To check your blood pressure when you are not at a hospital or clinic, you can use: ? An automated blood pressure machine at a pharmacy. ? A home blood pressure monitor.  If you are between 55 years and 79 years old, ask your health care provider if you should take aspirin to prevent strokes.  Have regular diabetes screenings. This involves taking a blood sample to check your fasting blood sugar level. ? If you are at a normal weight and have a low risk for diabetes, have this test once every three years after 66 years of age. ? If you are overweight and have a high risk for diabetes, consider being tested at a younger age or more often. Preventing infection Hepatitis B  If you have a higher risk for hepatitis B, you should be screened for this virus. You are considered at high risk for hepatitis B if: ? You were born in a country where hepatitis B is common. Ask your health care provider which countries are considered high risk. ? Your   parents were born in a high-risk country, and you have not been immunized against hepatitis B (hepatitis B vaccine). ? You have HIV or AIDS. ? You use needles to inject street drugs. ? You live with someone who has hepatitis B. ? You have had sex with someone who has hepatitis B. ? You get hemodialysis treatment. ? You take certain medicines for conditions, including cancer, organ transplantation, and autoimmune conditions.  Hepatitis C  Blood testing is recommended for: ? Everyone born from 1945 through  1965. ? Anyone with known risk factors for hepatitis C.  Sexually transmitted infections (STIs)  You should be screened for sexually transmitted infections (STIs) including gonorrhea and chlamydia if: ? You are sexually active and are younger than 66 years of age. ? You are older than 66 years of age and your health care provider tells you that you are at risk for this type of infection. ? Your sexual activity has changed since you were last screened and you are at an increased risk for chlamydia or gonorrhea. Ask your health care provider if you are at risk.  If you do not have HIV, but are at risk, it may be recommended that you take a prescription medicine daily to prevent HIV infection. This is called pre-exposure prophylaxis (PrEP). You are considered at risk if: ? You are sexually active and do not regularly use condoms or know the HIV status of your partner(s). ? You take drugs by injection. ? You are sexually active with a partner who has HIV.  Talk with your health care provider about whether you are at high risk of being infected with HIV. If you choose to begin PrEP, you should first be tested for HIV. You should then be tested every 3 months for as long as you are taking PrEP. Pregnancy  If you are premenopausal and you may become pregnant, ask your health care provider about preconception counseling.  If you may become pregnant, take 400 to 800 micrograms (mcg) of folic acid every day.  If you want to prevent pregnancy, talk to your health care provider about birth control (contraception). Osteoporosis and menopause  Osteoporosis is a disease in which the bones lose minerals and strength with aging. This can result in serious bone fractures. Your risk for osteoporosis can be identified using a bone density scan.  If you are 65 years of age or older, or if you are at risk for osteoporosis and fractures, ask your health care provider if you should be screened.  Ask your health  care provider whether you should take a calcium or vitamin D supplement to lower your risk for osteoporosis.  Menopause may have certain physical symptoms and risks.  Hormone replacement therapy may reduce some of these symptoms and risks. Talk to your health care provider about whether hormone replacement therapy is right for you. Follow these instructions at home:  Schedule regular health, dental, and eye exams.  Stay current with your immunizations.  Do not use any tobacco products including cigarettes, chewing tobacco, or electronic cigarettes.  If you are pregnant, do not drink alcohol.  If you are breastfeeding, limit how much and how often you drink alcohol.  Limit alcohol intake to no more than 1 drink per day for nonpregnant women. One drink equals 12 ounces of beer, 5 ounces of wine, or 1 ounces of hard liquor.  Do not use street drugs.  Do not share needles.  Ask your health care provider for help if you need   support or information about quitting drugs.  Tell your health care provider if you often feel depressed.  Tell your health care provider if you have ever been abused or do not feel safe at home. This information is not intended to replace advice given to you by your health care provider. Make sure you discuss any questions you have with your health care provider. Document Released: 04/05/2011 Document Revised: 02/26/2016 Document Reviewed: 06/24/2015 Elsevier Interactive Patient Education  2018 [employer]  

## 2017-11-09 NOTE — Assessment & Plan Note (Signed)
Chronic problem.  Asymptomatic.  Check labs.  Adjust meds prn  

## 2017-11-09 NOTE — Patient Instructions (Signed)
Follow up in 6 months to recheck cholesterol We'll notify you of your lab results and make any changes if needed Continue to work on healthy diet and regular exercise- you can do it! Check on the date of your Prevnar shot at the pharmacy so we can do the Pneumovax START the Zpack as directed Call with any questions or concerns Hang in there! Happy Early Rudene And[place]!!!

## 2017-11-09 NOTE — Progress Notes (Signed)
   Subjective:    Patient ID: [name] [name], female    DOB: 11-19-1950, 67 y.o.   MRN: [id number]  HPI CPE- UTD on colonoscopy, mammo.  DEXA ordered at [facility].  UTD on flu, Prevnar.   Review of Systems Patient reports no vision/ hearing changes, adenopathy,fever, weight change,  persistant/recurrent hoarseness , swallowing issues, chest pain, palpitations, edema, hemoptysis, gastrointestinal bleeding (melena, rectal bleeding), abdominal pain, significant heartburn, bowel changes, GU symptoms (dysuria, hematuria, incontinence), Gyn symptoms (abnormal  bleeding, pain),  syncope, focal weakness, memory loss, numbness & tingling, skin/hair/nail changes, abnormal bruising or bleeding, anxiety, or depression.   + cough- started before Christmas.  Was dry, now productive x3 days.  No fevers.  No wheezing or SOB. + SOB w/ exertion due to weight gain + snoring- husband reports breathing pauses    Objective:   Physical Exam General Appearance:    Alert, cooperative, no distress, appears stated age  Head:    Normocephalic, without obvious abnormality, atraumatic  Eyes:    PERRL, conjunctiva/corneas clear, EOM's intact, fundi    benign, both eyes  Ears:    Normal TM's and external ear canals, both ears  Nose:   Nares normal, septum midline, mucosa normal, no drainage    or sinus tenderness  Throat:   Lips, mucosa, and tongue normal; teeth and gums normal  Neck:   Supple, symmetrical, trachea midline, no adenopathy;    Thyroid: no enlargement/tenderness/nodules  Back:     Symmetric, no curvature, ROM normal, no CVA tenderness  Lungs:     Clear to auscultation bilaterally, deep hacking cough  Chest Wall:    No tenderness or deformity   Heart:    Regular rate and rhythm, S1 and S2 normal, no murmur, rub   or gallop  Breast Exam:    Deferred to mammo  Abdomen:     Soft, non-tender, bowel sounds active all four quadrants,    no masses, no organomegaly  Genitalia:    Deferred  Rectal:      Extremities:   Extremities normal, atraumatic, no cyanosis or edema  Pulses:   2+ and symmetric all extremities  Skin:   Skin color, texture, turgor normal, no rashes or lesions  Lymph nodes:   Cervical, supraclavicular, and axillary nodes normal  Neurologic:   CNII-XII intact, normal strength, sensation and reflexes    throughout          Assessment & Plan:  Acute bronchitis- new.  Pt has had cough since December but the character has changed in the last few days and is now productive.  Hx of bronchitis.  Start abx.  Reviewed supportive care and red flags that should prompt return.  Pt expressed understanding and is in agreement w/ plan.   Snoring- refer to pulm for sleep study.

## 2017-11-09 NOTE — Assessment & Plan Note (Signed)
Check labs.  Replete prn. 

## 2017-11-09 NOTE — Assessment & Plan Note (Signed)
Pt's PE WNL w/ exception of obesity.  UTD on mammo, colonoscopy.  DEXA ordered.  Pt is to check on date of Prevnar so we can then give Pneumovax.  Check labs.  Anticipatory guidance provided.

## 2017-11-09 NOTE — Assessment & Plan Note (Signed)
Chronic problem.  Tolerating statin w/o difficulty.  Check labs.  Adjust meds prn  

## 2017-11-10 ENCOUNTER — Encounter: Payer: Self-pay | Admitting: Family Medicine

## 2017-11-10 MED ORDER — PROMETHAZINE-DM 6.25-15 MG/5ML PO SYRP: 5.0000 mL | ORAL_SOLUTION | Freq: Four times a day (QID) | ORAL | 0 refills | Status: DC | PRN

## 2017-11-14 ENCOUNTER — Other Ambulatory Visit: Payer: Self-pay | Admitting: Family Medicine

## 2017-11-15 ENCOUNTER — Encounter: Payer: Self-pay | Admitting: *Deleted

## 2017-11-24 ENCOUNTER — Encounter: Payer: Self-pay | Admitting: Pulmonary Disease

## 2017-11-24 ENCOUNTER — Ambulatory Visit (INDEPENDENT_AMBULATORY_CARE_PROVIDER_SITE_OTHER): Payer: Medicare HMO | Admitting: Pulmonary Disease

## 2017-11-24 VITALS — BP 133/85 | HR 83 | Ht 66.0 in | Wt 210.0 lb

## 2017-11-24 DIAGNOSIS — R05 Cough: Secondary | ICD-10-CM | POA: Diagnosis not present

## 2017-11-24 DIAGNOSIS — R29818 Other symptoms and signs involving the nervous system: Secondary | ICD-10-CM | POA: Diagnosis not present

## 2017-11-24 DIAGNOSIS — R058 Other specified cough: Secondary | ICD-10-CM

## 2017-11-24 MED ORDER — FLUTICASONE PROPIONATE 50 MCG/ACT NA SUSP: 1.0000 | Freq: Every day | NASAL | 2 refills | Status: DC

## 2017-11-24 NOTE — Progress Notes (Signed)
   Subjective:    Patient ID: [name] [name], female    DOB: 02-05-1951, 67 y.o.   MRN: [id number]  HPI    Review of Systems  Constitutional: Negative for fever and unexpected weight change.  HENT: Positive for sneezing. Negative for congestion, dental problem, ear pain, nosebleeds, postnasal drip, rhinorrhea, sinus pressure, sore throat and trouble swallowing.   Eyes: Negative for redness and itching.  Respiratory: Positive for cough, shortness of breath and wheezing. Negative for chest tightness.   Cardiovascular: Positive for palpitations. Negative for leg swelling.  Gastrointestinal: Negative for nausea and vomiting.  Genitourinary: Negative for dysuria.  Musculoskeletal: Negative for joint swelling.  Skin: Negative for rash.  Allergic/Immunologic: Positive for environmental allergies. Negative for food allergies and immunocompromised state.  Neurological: Negative for headaches.  Hematological: Bruises/bleeds easily.  Psychiatric/Behavioral: Negative for dysphoric mood. The patient is not nervous/anxious.        Objective:   Physical Exam        Assessment & Plan:

## 2017-11-24 NOTE — Patient Instructions (Signed)
Try using saline nose spray daily for one week, then as needed Flonase 1 spray each nostril daily for one week, then as needed Sip water when you have an urge to cough Use sugarless candy to keep your mouth moist Salt water gargles once or twice per day until cough is better Avoid clearing your throat or forcing a cough Will arrange for home sleep study Will call to arrange for follow up after sleep study reviewed

## 2017-11-24 NOTE — Progress Notes (Signed)
[facility]  Chief Complaint  Patient presents with  . sleep consult    Pt referred by Dr. Annye Asa MD. Pt has productive cough-green, SOB with exertion. Pt snores loudly, gasps for air at times. Pt sleeps 8 hrs each night, but up 4-5 times toss and turn.    Vital signs: BP 133/85 (BP Location: Left Arm, Cuff Size: Normal)   Pulse 83   Ht 5\' 6"  (1.676 m)   Wt 210 lb (95.3 kg)   SpO2 93%   BMI 33.89 kg/m   History of Present Illness: [name] [name] is a 67 y.o. female for evaluation of sleep problems.  Her husband has been worried about her sleep.  She snores, but this has been getting worse.  She is now snoring even when she sleeps on her side.  He has also noticed that she will stop breathing at times while asleep, and wake up with a grunt.  She will get tired in the evening, and can fall asleep while watching TV.  Her brother has sleep apnea and wears a CPAP machine.  She goes to sleep at 11 pm.  She falls asleep within a few minutes.  She wakes up some times to use the bathroom.  She gets out of bed at 8 am.  She feels okay in the morning.  She denies morning headache.  She does not use anything to help her stay awake.  She takes melatonin about 30 minutes before going to bed, and has done so for several years.  She denies sleep walking, sleep talking, bruxism, or nightmares.  There is no history of restless legs.  She denies sleep hallucinations, sleep paralysis, or cataplexy.  The Epworth score is 4 out of 24.  She developed a cough a few weeks ago.  She was getting some wheeze and chest congestion.  She was treated with a Zpak and this helped some.  She still gets a dry cough.  She is sneezing also and gets some sinus drainage.  She feels like something is stuck in her throat and she tries to clear this.  She does take omeprazole for history of reflux.   Physical Exam:  General - pleasant Eyes - pupils reactive ENT - no sinus  tenderness, no oral exudate, no LAN, decreased AP diameter, triangular uvula Cardiac - regular, no murmur Chest - no wheeze, rales Abd - soft, non tender Ext - no edema Skin - no rashes Neuro - normal strength Psych - normal mood  Discussion: She has snoring, sleep disruption, witnessed apnea, and daytime sleepiness.  She has history of borderline hypertension.  She has a family history of sleep apnea.  We discussed how sleep apnea can affect various health problems, including risks for hypertension, cardiovascular disease, and diabetes.  We also discussed how sleep disruption can increase risks for accidents, such as while driving.  Weight loss as a means of improving sleep apnea was also reviewed.  Additional treatment options discussed were CPAP therapy, oral appliance, and surgical intervention.  She also has persistent cough associated with post nasal drip.  Assessment/Plan:  Suspected sleep apnea. - will arrange for home sleep study  Upper airway cough syndrome - saline nose spray daily for one week, then as needed - Flonase 1 spray each nostril daily for one week, then as needed - Sip water when you have an urge to cough - Use sugarless candy to keep  mouth moist - Salt water gargles once  or twice per day until cough is better - Avoid clearing your throat or forcing a cough   Patient Instructions  Try using saline nose spray daily for one week, then as needed Flonase 1 spray each nostril daily for one week, then as needed Sip water when you have an urge to cough Use sugarless candy to keep your mouth moist Salt water gargles once or twice per day until cough is better Avoid clearing your throat or forcing a cough Will arrange for home sleep study Will call to arrange for follow up after sleep study reviewed     Chesley Mires, MD [place] 11/24/2017, 11:18 AM Pager:  [phone]  Flow Sheet  Sleep tests:  Cardiac tests: Echo >> EF 55 to  60%, grade 1 DD  Review of Systems: Constitutional: Negative for fever and unexpected weight change.  HENT: Positive for sneezing. Negative for congestion, dental problem, ear pain, nosebleeds, postnasal drip, rhinorrhea, sinus pressure, sore throat and trouble swallowing.   Eyes: Negative for redness and itching.  Respiratory: Positive for cough, shortness of breath and wheezing. Negative for chest tightness.   Cardiovascular: Positive for palpitations. Negative for leg swelling.  Gastrointestinal: Negative for nausea and vomiting.  Genitourinary: Negative for dysuria.  Musculoskeletal: Negative for joint swelling.  Skin: Negative for rash.  Allergic/Immunologic: Positive for environmental allergies. Negative for food allergies and immunocompromised state.  Neurological: Negative for headaches.  Hematological: Bruises/bleeds easily.  Psychiatric/Behavioral: Negative for dysphoric mood. The patient is not nervous/anxious.    Past Medical History: She  has a past medical history of Allergy, unspecified not elsewhere classified, Borderline hypertension, Bronchitis, DJD (degenerative joint disease), GERD (gastroesophageal reflux disease), History of skin cancer, Hypercholesterolemia, Hypothyroidism, IBS (irritable bowel syndrome), PONV (postoperative nausea and vomiting), Postoperative nausea and vomiting (09/02/2015), Primary localized osteoarthritis of left knee, Skin cancer (melanoma) ([place]), and Vitamin D deficiency.  Past Surgical History: She  has a past surgical history that includes Knee arthroscopy (1987&2003); Skin surgery (4/05); Hernia repair [id number]); Tubal ligation (1990); Bunionectomy (2004); Foot Osteotomy (2003); Shoulder arthroscopy with open rotator cuff repair (2013); Total knee arthroplasty (Left, 09/01/2015); Melanoma excision (Right); and Colonoscopy.  Family History: Her family history includes Cancer in her father; Colon polyps in her father; Diabetes in her son.  Social  History: She  reports that she quit smoking about 42 years ago. She quit after 6.00 years of use. she has never used smokeless tobacco. She reports that she drinks about 6.0 oz of alcohol per week. She reports that she does not use drugs.  Medications: Allergies as of 11/24/2017      Reactions   Clindamycin/lincomycin Diarrhea   Flagyl [metronidazole Hcl] Diarrhea   Stomach upset. Pt states she CANNOT TAKE oral, has no problem with topical.    Augmentin [amoxicillin-pot Clavulanate] Diarrhea   Telithromycin Diarrhea      Medication List        Accurate as of 11/24/17 11:18 AM. Always use your most recent med list.          aspirin 325 MG tablet Take 325 mg by mouth daily.   cetirizine 10 MG tablet Commonly known as:  ZYRTEC Take 10 mg by mouth daily.   doxycycline 50 MG tablet Commonly known as:  ADOXA Take 50 mg by mouth daily.   fluticasone 50 MCG/ACT nasal spray Commonly known as:  FLONASE Place 1 spray into both nostrils daily.   Magnesium 200 MG Tabs Take 200 mg by mouth daily.   MELATONIN  PO Take 3 mg by mouth at bedtime as needed and may repeat dose one time if needed.   MULTIVITAMIN PO Take 1 tablet by mouth daily.   NP THYROID 60 MG tablet Generic drug:  thyroid TAKE 1 TABLET(60 MG) BY MOUTH DAILY BEFORE BREAKFAST   NP THYROID 60 MG tablet Generic drug:  thyroid TAKE 1 TABLET(60 MG) BY MOUTH DAILY BEFORE BREAKFAST   omeprazole 20 MG capsule Commonly known as:  PRILOSEC TAKE 1 CAPSULE BY MOUTH  DAILY   promethazine-dextromethorphan 6.25-15 MG/5ML syrup Commonly known as:  PROMETHAZINE-DM Take 5 mLs by mouth 4 (four) times daily as needed for cough.   simvastatin 20 MG tablet Commonly known as:  ZOCOR TAKE 1 TABLET BY MOUTH AT  BEDTIME   Urea 40 % Gel   Vitamin D3 5000 units Caps Take 5,000 Units by mouth daily.

## 2017-12-25 DIAGNOSIS — G4733 Obstructive sleep apnea (adult) (pediatric): Secondary | ICD-10-CM | POA: Diagnosis not present

## 2017-12-27 ENCOUNTER — Ambulatory Visit (INDEPENDENT_AMBULATORY_CARE_PROVIDER_SITE_OTHER)
Admission: RE | Admit: 2017-12-27 | Discharge: 2017-12-27 | Disposition: A | Discharge status: Home / Self Care | Payer: Medicare HMO | Source: Ambulatory Visit | Attending: Family Medicine | Admitting: Family Medicine

## 2017-12-27 ENCOUNTER — Telehealth: Payer: Self-pay | Admitting: Pulmonary Disease

## 2017-12-27 ENCOUNTER — Other Ambulatory Visit: Payer: Self-pay | Admitting: *Deleted

## 2017-12-27 ENCOUNTER — Encounter: Payer: Self-pay | Admitting: Pulmonary Disease

## 2017-12-27 DIAGNOSIS — R29818 Other symptoms and signs involving the nervous system: Secondary | ICD-10-CM

## 2017-12-27 DIAGNOSIS — G4733 Obstructive sleep apnea (adult) (pediatric): Secondary | ICD-10-CM | POA: Insufficient documentation

## 2017-12-27 DIAGNOSIS — E2839 Other primary ovarian failure: Secondary | ICD-10-CM | POA: Diagnosis not present

## 2017-12-27 HISTORY — DX: Obstructive sleep apnea (adult) (pediatric): G47.33

## 2017-12-27 NOTE — Telephone Encounter (Signed)
HST 12/25/17 >> AHI 7.9. SaO2 low 85%.   Will have my nurse inform pt that sleep study shows mild sleep apnea.  Please schedule ROV with me to discuss tx options.

## 2017-12-28 NOTE — Telephone Encounter (Signed)
Double book with me.

## 2017-12-28 NOTE — Telephone Encounter (Signed)
Dr. Halford Chessman you are booked up until End of May.  Does this patients ROV need to be scheduled before then, with NP, or is the end of May okay?  VS Please advise

## 2017-12-30 ENCOUNTER — Encounter: Payer: Self-pay | Admitting: Family Medicine

## 2017-12-30 NOTE — Telephone Encounter (Signed)
I was able to talk with patient about her results.  She verbalized an understanding and was able to schedule the ROV for January 23, 2018 at 1000.   Nothing further is needed

## 2018-01-02 ENCOUNTER — Encounter: Payer: Self-pay | Admitting: General Practice

## 2018-01-09 ENCOUNTER — Other Ambulatory Visit: Payer: Self-pay

## 2018-01-09 ENCOUNTER — Encounter: Payer: Self-pay | Admitting: Physician Assistant

## 2018-01-09 ENCOUNTER — Ambulatory Visit (INDEPENDENT_AMBULATORY_CARE_PROVIDER_SITE_OTHER): Payer: Medicare HMO | Admitting: Physician Assistant

## 2018-01-09 VITALS — BP 132/72 | HR 73 | Temp 98.3°F | Resp 16 | Ht 66.0 in | Wt 212.0 lb

## 2018-01-09 DIAGNOSIS — B9689 Other specified bacterial agents as the cause of diseases classified elsewhere: Secondary | ICD-10-CM | POA: Diagnosis not present

## 2018-01-09 DIAGNOSIS — J208 Acute bronchitis due to other specified organisms: Secondary | ICD-10-CM | POA: Diagnosis not present

## 2018-01-09 MED ORDER — AZITHROMYCIN 250 MG PO TABS: ORAL_TABLET | ORAL | 0 refills | Status: DC

## 2018-01-09 MED ORDER — BENZONATATE 100 MG PO CAPS: 100.0000 mg | ORAL_CAPSULE | Freq: Two times a day (BID) | ORAL | 0 refills | Status: DC | PRN

## 2018-01-09 NOTE — Patient Instructions (Signed)
Take antibiotic (Azithromycin) as directed.  Increase fluids.  Get plenty of rest. Use Mucinex for congestion. Tessalon as directed for cough. Take a daily probiotic (I recommend Align or Culturelle, but even Activia Yogurt may be beneficial).  A humidifier placed in the bedroom may offer some relief for a dry, scratchy throat of nasal irritation.  Read information below on acute bronchitis. Please call or return to clinic if symptoms are not improving.  Acute Bronchitis Bronchitis is when the airways that extend from the windpipe into the lungs get red, puffy, and painful (inflamed). Bronchitis often causes thick spit (mucus) to develop. This leads to a cough. A cough is the most common symptom of bronchitis. In acute bronchitis, the condition usually begins suddenly and goes away over time (usually in 2 weeks). Smoking, allergies, and asthma can make bronchitis worse. Repeated episodes of bronchitis may cause more lung problems.  HOME CARE  Rest.  Drink enough fluids to keep your pee (urine) clear or pale yellow (unless you need to limit fluids as told by your doctor).  Only take over-the-counter or prescription medicines as told by your doctor.  Avoid smoking and secondhand smoke. These can make bronchitis worse. If you are a smoker, think about using nicotine gum or skin patches. Quitting smoking will help your lungs heal faster.  Reduce the chance of getting bronchitis again by:  Washing your hands often.  Avoiding people with cold symptoms.  Trying not to touch your hands to your mouth, nose, or eyes.  Follow up with your doctor as told.  GET HELP IF: Your symptoms do not improve after 1 week of treatment. Symptoms include:  Cough.  Fever.  Coughing up thick spit.  Body aches.  Chest congestion.  Chills.  Shortness of breath.  Sore throat.  GET HELP RIGHT AWAY IF:   You have an increased fever.  You have chills.  You have severe shortness of breath.  You  have bloody thick spit (sputum).  You throw up (vomit) often.  You lose too much body fluid (dehydration).  You have a severe headache.  You faint.  MAKE SURE YOU:   Understand these instructions.  Will watch your condition.  Will get help right away if you are not doing well or get worse. Document Released: 03/08/2008 Document Revised: 05/23/2013 Document Reviewed: 03/13/2013 [facility] Patient Information 2015 [place], [place]. This information is not intended to replace advice given to you by your health care provider. Make sure you discuss any questions you have with your health care provider.

## 2018-01-09 NOTE — Progress Notes (Signed)
Patient presents to clinic today c/o chest congestion with cough that was initially dry but has now become productive of thick sputum. Endorses sputum changed from clear to colored. Denies fever. Notes some fatigue. Denies chest pain or SOB. Denies recent travel or sick contact.    Past Medical History:  Diagnosis Date  . Allergy, unspecified not elsewhere classified   . Borderline hypertension   . Bronchitis   . DJD (degenerative joint disease)   . GERD (gastroesophageal reflux disease)   . History of skin cancer   . Hypercholesterolemia   . Hypothyroidism   . IBS (irritable bowel syndrome)   . OSA (obstructive sleep apnea) 12/27/2017  . PONV (postoperative nausea and vomiting)   . Postoperative nausea and vomiting 09/02/2015  . Primary localized osteoarthritis of left knee   . Skin cancer (melanoma) ([facility])    basil cell   . Vitamin D deficiency     Current Outpatient Medications on File Prior to Visit  Medication Sig Dispense Refill  . aspirin 325 MG tablet Take 325 mg by mouth daily.    . cetirizine (ZYRTEC) 10 MG tablet Take 10 mg by mouth daily.    . Cholecalciferol (VITAMIN D3) 5000 UNITS CAPS Take 5,000 Units by mouth daily.     Marland Kitchen doxycycline (ADOXA) 50 MG tablet Take 50 mg by mouth daily.    . Magnesium 200 MG TABS Take 200 mg by mouth daily.    Marland Kitchen MELATONIN PO Take 3 mg by mouth at bedtime as needed and may repeat dose one time if needed.    . Multiple Vitamins-Minerals (MULTIVITAMIN PO) Take 1 tablet by mouth daily.    . NP THYROID 60 MG tablet TAKE 1 TABLET(60 MG) BY MOUTH DAILY BEFORE BREAKFAST 90 tablet 0  . NP THYROID 60 MG tablet TAKE 1 TABLET(60 MG) BY MOUTH DAILY BEFORE BREAKFAST 90 tablet 0  . omeprazole (PRILOSEC) 20 MG capsule TAKE 1 CAPSULE BY MOUTH  DAILY 90 capsule 1  . promethazine-dextromethorphan (PROMETHAZINE-DM) 6.25-15 MG/5ML syrup Take 5 mLs by mouth 4 (four) times daily as needed for cough. 240 mL 0  . simvastatin (ZOCOR) 20 MG tablet TAKE 1 TABLET  BY MOUTH AT  BEDTIME 90 tablet 1  . Urea 40 % GEL     . fluticasone (FLONASE) 50 MCG/ACT nasal spray Place 1 spray into both nostrils daily. (Patient not taking: Reported on 01/09/2018) 16 g 2   No current facility-administered medications on file prior to visit.     Allergies  Allergen Reactions  . Clindamycin/Lincomycin Diarrhea  . Flagyl [Metronidazole Hcl] Diarrhea    Stomach upset. Pt states she CANNOT TAKE oral, has no problem with topical.   . Augmentin [Amoxicillin-Pot Clavulanate] Diarrhea  . Telithromycin Diarrhea    Family History  Problem Relation Age of Onset  . Colon polyps Father   . Cancer Father        laryngeal  . Diabetes Son   . Colon cancer Neg Hx   . Stomach cancer Neg Hx     Social History   Socioeconomic History  . Marital status: Married    Spouse name: Marya Amsler [phone]  . Number of children: 2  . Years of education: Not on file  . Highest education level: Not on file  Occupational History  . Not on file  Social Needs  . Financial resource strain: Not on file  . Food insecurity:    Worry: Not on file    Inability: Not on file  .  Transportation needs:    Medical: Not on file    Non-medical: Not on file  Tobacco Use  . Smoking status: Former Smoker    Years: 6.00    Last attempt to quit: 10/05/1975    Years since quitting: 42.2  . Smokeless tobacco: Never Used  Substance and Sexual Activity  . Alcohol use: Yes    Alcohol/week: 6.0 oz    Types: 12 Standard drinks or equivalent per week    Comment: socially  . Drug use: No  . Sexual activity: Not on file  Lifestyle  . Physical activity:    Days per week: Not on file    Minutes per session: Not on file  . Stress: Not on file  Relationships  . Social connections:    Talks on phone: Not on file    Gets together: Not on file    Attends religious service: Not on file    Active member of club or organization: Not on file    Attends meetings of clubs or organizations: Not on file     Relationship status: Not on file  Other Topics Concern  . Not on file  Social History Narrative  . Not on file   Review of Systems - See HPI.  All other ROS are negative.  BP 132/72   Pulse 73   Temp 98.3 F (36.8 C) (Oral)   Resp 16   Ht 5\' 6"  (1.676 m)   Wt 212 lb (96.2 kg)   SpO2 98%   BMI 34.22 kg/m   Physical Exam  Constitutional: She appears well-developed and well-nourished.  HENT:  Head: Normocephalic and atraumatic.  Right Ear: External ear normal.  Left Ear: External ear normal.  Nose: Nose normal.  Mouth/Throat: Oropharynx is clear and moist.  Cardiovascular: Normal rate, regular rhythm, normal heart sounds and intact distal pulses.  Pulmonary/Chest: Effort normal and breath sounds normal. No stridor. No respiratory distress. She has no wheezes. She has no rales. She exhibits no tenderness.  Skin: Skin is warm and dry.  Vitals reviewed.   Recent Results (from the past 2160 hour(s))  Lipid panel     Status: None   Collection Time: 11/09/17 10:46 AM  Result Value Ref Range   Cholesterol 142 0 - 200 mg/dL    Comment: ATP III Classification       Desirable:  < 200 mg/dL               Borderline High:  200 - 239 mg/dL          High:  > = 240 mg/dL   Triglycerides 80.0 0.0 - 149.0 mg/dL    Comment: Normal:  <150 mg/dLBorderline High:  150 - 199 mg/dL   HDL 53.50 >39.00 mg/dL   VLDL 16.0 0.0 - 40.0 mg/dL   LDL Cholesterol 73 0 - 99 mg/dL   Total CHOL/HDL Ratio 3     Comment:                Men          Women1/2 Average Risk     3.4          3.3Average Risk          5.0          4.42X Average Risk          9.6          7.13X Average Risk          15.0  11.0                       NonHDL 88.70     Comment: NOTE:  Non-HDL goal should be 30 mg/dL higher than patient's LDL goal (i.e. LDL goal of < 70 mg/dL, would have non-HDL goal of < 100 mg/dL)  Basic metabolic panel     Status: None   Collection Time: 11/09/17 10:46 AM  Result Value Ref Range   Sodium 140  135 - 145 mEq/L   Potassium 3.9 3.5 - 5.1 mEq/L   Chloride 104 96 - 112 mEq/L   CO2 28 19 - 32 mEq/L   Glucose, Bld 97 70 - 99 mg/dL   BUN 17 6 - 23 mg/dL   Creatinine, Ser 0.76 0.40 - 1.20 mg/dL   Calcium 9.4 8.4 - 10.5 mg/dL   GFR 80.94 >60.00 mL/min  TSH     Status: None   Collection Time: 11/09/17 10:46 AM  Result Value Ref Range   TSH 1.78 0.35 - 4.50 uIU/mL  Hepatic function panel     Status: Abnormal   Collection Time: 11/09/17 10:46 AM  Result Value Ref Range   Total Bilirubin 0.6 0.2 - 1.2 mg/dL   Bilirubin, Direct 0.1 0.0 - 0.3 mg/dL   Alkaline Phosphatase 91 39 - 117 U/L   AST 34 0 - 37 U/L   ALT 37 (H) 0 - 35 U/L   Total Protein 7.2 6.0 - 8.3 g/dL   Albumin 4.5 3.5 - 5.2 g/dL  CBC with Differential/Platelet     Status: Abnormal   Collection Time: 11/09/17 10:46 AM  Result Value Ref Range   WBC 6.1 4.0 - 10.5 K/uL   RBC 4.54 3.87 - 5.11 Mil/uL   Hemoglobin 11.9 (L) 12.0 - 15.0 g/dL   HCT 36.4 36.0 - 46.0 %   MCV 80.2 78.0 - 100.0 fl   MCHC 32.7 30.0 - 36.0 g/dL   RDW 18.6 (H) 11.5 - 15.5 %   Platelets 299.0 150.0 - 400.0 K/uL   Neutrophils Relative % 60.0 43.0 - 77.0 %   Lymphocytes Relative 31.0 12.0 - 46.0 %   Monocytes Relative 6.4 3.0 - 12.0 %   Eosinophils Relative 2.0 0.0 - 5.0 %   Basophils Relative 0.6 0.0 - 3.0 %   Neutro Abs 3.7 1.4 - 7.7 K/uL   Lymphs Abs 1.9 0.7 - 4.0 K/uL   Monocytes Absolute 0.4 0.1 - 1.0 K/uL   Eosinophils Absolute 0.1 0.0 - 0.7 K/uL   Basophils Absolute 0.0 0.0 - 0.1 K/uL  VITAMIN D 25 Hydroxy (Vit-D Deficiency, Fractures)     Status: None   Collection Time: 11/09/17 10:46 AM  Result Value Ref Range   VITD 45.55 30.00 - 100.00 ng/mL    Assessment/Plan: 1. Acute bacterial bronchitis Rx Azithromycin.  Increase fluids.  Rest.  Saline nasal spray.  Probiotic.  Mucinex as directed.  Humidifier in bedroom. Tessalon per orders.  Call or return to clinic if symptoms are not improving.    Leeanne Rio, PA-C

## 2018-01-23 ENCOUNTER — Encounter: Payer: Self-pay | Admitting: Pulmonary Disease

## 2018-01-23 ENCOUNTER — Ambulatory Visit: Payer: Medicare HMO | Admitting: Pulmonary Disease

## 2018-01-23 VITALS — BP 138/80 | HR 104 | Ht 66.0 in | Wt 213.0 lb

## 2018-01-23 DIAGNOSIS — R05 Cough: Secondary | ICD-10-CM | POA: Diagnosis not present

## 2018-01-23 DIAGNOSIS — R058 Other specified cough: Secondary | ICD-10-CM

## 2018-01-23 DIAGNOSIS — G4733 Obstructive sleep apnea (adult) (pediatric): Secondary | ICD-10-CM

## 2018-01-23 NOTE — Patient Instructions (Signed)
Check with your insurance about coverage for auto CPAP machine versus oral appliance to treat obstructive sleep apnea  Check with your dentist about whether you are a candidate for an oral appliance  Follow up in 4 months

## 2018-01-23 NOTE — Progress Notes (Signed)
[facility]  Chief Complaint  Patient presents with  . Follow-up    sleep study    Vital signs: BP 138/80 (BP Location: Left Arm, Cuff Size: Normal)   Pulse (!) 104   Ht 5\' 6"  (1.676 m)   Wt 213 lb (96.6 kg)   SpO2 97%   BMI 34.38 kg/m   History of Present Illness: [name] [name] is a 67 y.o. female with obstructive sleep apnea.  She had sleep study that showed mild sleep apnea.  She is here to discuss tx options.  She also developed more allergies.  Has scratchy throat and runny nose.  Hasn't been using flonase.  Physical Exam:  General - pleasant Eyes - pupils reactive ENT - no sinus tenderness, no oral exudate, no LAN, raspy voice, triangular uvula, decreased AP diameter Cardiac - regular, no murmur Chest - no wheeze, rales Abd - soft, non tender Ext - no edema Skin - no rashes Neuro - normal strength Psych - normal mood   Assessment/Plan:  Obstructive sleep apnea. - We discussed how sleep apnea can affect various health problems, including risks for hypertension, cardiovascular disease, and diabetes.  We also discussed how sleep disruption can increase risks for accidents, such as while driving.  Weight loss as a means of improving sleep apnea was also reviewed.  Additional treatment options discussed were CPAP therapy, oral appliance, and surgical intervention. - she will notify us when she decides if she wants to try auto CPAP or an oral appliance  Upper airway cough syndrome - resume flonase until symptoms improved   Patient Instructions  Check with your insurance about coverage for auto CPAP machine versus oral appliance to treat obstructive sleep apnea  Check with your dentist about whether you are a candidate for an oral appliance  Follow up in 4 months    Chesley Mires, MD [place] 01/23/2018, 10:30 AM Pager:  [phone]  Flow Sheet  Sleep tests: HST 12/25/17 >> AHI 7.9. SaO2 low  85%.  Cardiac tests: Echo >> EF 55 to 60%, grade 1 DD  Past Medical History: She  has a past medical history of Allergy, unspecified not elsewhere classified, Borderline hypertension, Bronchitis, DJD (degenerative joint disease), GERD (gastroesophageal reflux disease), History of skin cancer, Hypercholesterolemia, Hypothyroidism, IBS (irritable bowel syndrome), OSA (obstructive sleep apnea) (12/27/2017), PONV (postoperative nausea and vomiting), Postoperative nausea and vomiting (09/02/2015), Primary localized osteoarthritis of left knee, Skin cancer (melanoma) ([place]), and Vitamin D deficiency.  Past Surgical History: She  has a past surgical history that includes Knee arthroscopy (1987&2003); Skin surgery (4/05); Hernia repair [phone]); Tubal ligation (1990); Bunionectomy (2004); Foot Osteotomy (2003); Shoulder arthroscopy with open rotator cuff repair (2013); Total knee arthroplasty (Left, 09/01/2015); Melanoma excision (Right); and Colonoscopy.  Family History: Her family history includes Cancer in her father; Colon polyps in her father; Diabetes in her son.  Social History: She  reports that she quit smoking about 42 years ago. She quit after 6.00 years of use. She has never used smokeless tobacco. She reports that she drinks about 6.0 oz of alcohol per week. She reports that she does not use drugs.  Medications: Allergies as of 01/23/2018      Reactions   Clindamycin/lincomycin Diarrhea   Flagyl [metronidazole Hcl] Diarrhea   Stomach upset. Pt states she CANNOT TAKE oral, has no problem with topical.    Augmentin [amoxicillin-pot Clavulanate] Diarrhea   Telithromycin Diarrhea      Medication List  Accurate as of 01/23/18 10:30 AM. Always use your most recent med list.          aspirin 325 MG tablet Take 325 mg by mouth daily.   benzonatate 100 MG capsule Commonly known as:  TESSALON Take 1 capsule (100 mg total) by mouth 2 (two) times daily as needed for cough.     cetirizine 10 MG tablet Commonly known as:  ZYRTEC Take 10 mg by mouth daily.   doxycycline 50 MG tablet Commonly known as:  ADOXA Take 50 mg by mouth daily.   Magnesium 200 MG Tabs Take 200 mg by mouth daily.   MELATONIN PO Take 3 mg by mouth at bedtime as needed and may repeat dose one time if needed.   MULTIVITAMIN PO Take 1 tablet by mouth daily.   NP THYROID 60 MG tablet Generic drug:  thyroid TAKE 1 TABLET(60 MG) BY MOUTH DAILY BEFORE BREAKFAST   omeprazole 20 MG capsule Commonly known as:  PRILOSEC TAKE 1 CAPSULE BY MOUTH  DAILY   promethazine-dextromethorphan 6.25-15 MG/5ML syrup Commonly known as:  PROMETHAZINE-DM Take 5 mLs by mouth 4 (four) times daily as needed for cough.   simvastatin 20 MG tablet Commonly known as:  ZOCOR TAKE 1 TABLET BY MOUTH AT  BEDTIME   Vitamin D3 5000 units Caps Take 5,000 Units by mouth daily.

## 2018-02-09 ENCOUNTER — Other Ambulatory Visit: Payer: Self-pay | Admitting: Family Medicine

## 2018-02-15 DIAGNOSIS — D2239 Melanocytic nevi of other parts of face: Secondary | ICD-10-CM | POA: Diagnosis not present

## 2018-02-15 DIAGNOSIS — L820 Inflamed seborrheic keratosis: Secondary | ICD-10-CM | POA: Diagnosis not present

## 2018-02-15 DIAGNOSIS — Z85820 Personal history of malignant melanoma of skin: Secondary | ICD-10-CM | POA: Diagnosis not present

## 2018-02-15 DIAGNOSIS — Z08 Encounter for follow-up examination after completed treatment for malignant neoplasm: Secondary | ICD-10-CM | POA: Diagnosis not present

## 2018-02-15 DIAGNOSIS — L821 Other seborrheic keratosis: Secondary | ICD-10-CM | POA: Diagnosis not present

## 2018-02-15 DIAGNOSIS — Z85828 Personal history of other malignant neoplasm of skin: Secondary | ICD-10-CM | POA: Diagnosis not present

## 2018-02-20 DIAGNOSIS — H35362 Drusen (degenerative) of macula, left eye: Secondary | ICD-10-CM | POA: Diagnosis not present

## 2018-02-20 DIAGNOSIS — H35373 Puckering of macula, bilateral: Secondary | ICD-10-CM | POA: Diagnosis not present

## 2018-02-20 DIAGNOSIS — H43813 Vitreous degeneration, bilateral: Secondary | ICD-10-CM | POA: Diagnosis not present

## 2018-02-20 DIAGNOSIS — H4423 Degenerative myopia, bilateral: Secondary | ICD-10-CM | POA: Diagnosis not present

## 2018-03-17 ENCOUNTER — Encounter: Payer: Self-pay | Admitting: Family Medicine

## 2018-03-17 MED ORDER — SIMVASTATIN 20 MG PO TABS: 20.0000 mg | ORAL_TABLET | Freq: Every day | ORAL | 1 refills | Status: DC

## 2018-03-17 MED ORDER — OMEPRAZOLE 20 MG PO CPDR: 20.0000 mg | DELAYED_RELEASE_CAPSULE | Freq: Every day | ORAL | 1 refills | Status: DC

## 2018-04-10 DIAGNOSIS — M25562 Pain in left knee: Secondary | ICD-10-CM | POA: Diagnosis not present

## 2018-04-10 DIAGNOSIS — M7581 Other shoulder lesions, right shoulder: Secondary | ICD-10-CM | POA: Diagnosis not present

## 2018-04-10 DIAGNOSIS — M722 Plantar fascial fibromatosis: Secondary | ICD-10-CM | POA: Diagnosis not present

## 2018-04-10 DIAGNOSIS — S83281A Other tear of lateral meniscus, current injury, right knee, initial encounter: Secondary | ICD-10-CM | POA: Diagnosis not present

## 2018-04-26 DIAGNOSIS — M6281 Muscle weakness (generalized): Secondary | ICD-10-CM | POA: Diagnosis not present

## 2018-04-26 DIAGNOSIS — S83281A Other tear of lateral meniscus, current injury, right knee, initial encounter: Secondary | ICD-10-CM | POA: Diagnosis not present

## 2018-04-26 DIAGNOSIS — Z7409 Other reduced mobility: Secondary | ICD-10-CM | POA: Diagnosis not present

## 2018-04-26 DIAGNOSIS — M7581 Other shoulder lesions, right shoulder: Secondary | ICD-10-CM | POA: Diagnosis not present

## 2018-04-27 DIAGNOSIS — M542 Cervicalgia: Secondary | ICD-10-CM | POA: Diagnosis not present

## 2018-05-12 ENCOUNTER — Other Ambulatory Visit: Payer: Self-pay | Admitting: Family Medicine

## 2018-05-16 DIAGNOSIS — H25013 Cortical age-related cataract, bilateral: Secondary | ICD-10-CM | POA: Diagnosis not present

## 2018-05-16 DIAGNOSIS — H18413 Arcus senilis, bilateral: Secondary | ICD-10-CM | POA: Diagnosis not present

## 2018-05-16 DIAGNOSIS — H401131 Primary open-angle glaucoma, bilateral, mild stage: Secondary | ICD-10-CM | POA: Diagnosis not present

## 2018-05-16 DIAGNOSIS — H2511 Age-related nuclear cataract, right eye: Secondary | ICD-10-CM | POA: Diagnosis not present

## 2018-05-16 DIAGNOSIS — H2513 Age-related nuclear cataract, bilateral: Secondary | ICD-10-CM | POA: Diagnosis not present

## 2018-05-16 DIAGNOSIS — H25043 Posterior subcapsular polar age-related cataract, bilateral: Secondary | ICD-10-CM | POA: Diagnosis not present

## 2018-05-17 ENCOUNTER — Encounter: Payer: Self-pay | Admitting: Family Medicine

## 2018-05-17 ENCOUNTER — Other Ambulatory Visit: Payer: Self-pay

## 2018-05-17 ENCOUNTER — Encounter: Payer: Self-pay | Admitting: General Practice

## 2018-05-17 ENCOUNTER — Ambulatory Visit (INDEPENDENT_AMBULATORY_CARE_PROVIDER_SITE_OTHER): Payer: Medicare HMO | Admitting: Family Medicine

## 2018-05-17 VITALS — BP 124/81 | HR 74 | Temp 98.2°F | Resp 16 | Ht 66.0 in | Wt 205.0 lb

## 2018-05-17 DIAGNOSIS — F418 Other specified anxiety disorders: Secondary | ICD-10-CM | POA: Insufficient documentation

## 2018-05-17 DIAGNOSIS — Z23 Encounter for immunization: Secondary | ICD-10-CM

## 2018-05-17 DIAGNOSIS — E038 Other specified hypothyroidism: Secondary | ICD-10-CM

## 2018-05-17 DIAGNOSIS — Z1231 Encounter for screening mammogram for malignant neoplasm of breast: Secondary | ICD-10-CM

## 2018-05-17 DIAGNOSIS — E785 Hyperlipidemia, unspecified: Secondary | ICD-10-CM | POA: Diagnosis not present

## 2018-05-17 DIAGNOSIS — H2513 Age-related nuclear cataract, bilateral: Secondary | ICD-10-CM | POA: Diagnosis not present

## 2018-05-17 LAB — CBC WITH DIFFERENTIAL/PLATELET
BASOS PCT: 0.7 % (ref 0.0–3.0)
Basophils Absolute: 0 10*3/uL (ref 0.0–0.1)
EOS ABS: 0.2 10*3/uL (ref 0.0–0.7)
Eosinophils Relative: 3.2 % (ref 0.0–5.0)
HCT: 37.8 % (ref 36.0–46.0)
Hemoglobin: 12 g/dL (ref 12.0–15.0)
LYMPHS ABS: 1.5 10*3/uL (ref 0.7–4.0)
Lymphocytes Relative: 31.4 % (ref 12.0–46.0)
MCHC: 31.8 g/dL (ref 30.0–36.0)
MCV: 72 fl — ABNORMAL LOW (ref 78.0–100.0)
MONO ABS: 0.4 10*3/uL (ref 0.1–1.0)
Monocytes Relative: 7.7 % (ref 3.0–12.0)
Neutro Abs: 2.7 10*3/uL (ref 1.4–7.7)
Neutrophils Relative %: 57 % (ref 43.0–77.0)
PLATELETS: 310 10*3/uL (ref 150.0–400.0)
RBC: 5.25 Mil/uL — ABNORMAL HIGH (ref 3.87–5.11)
RDW: 20.3 % — AB (ref 11.5–15.5)
WBC: 4.7 10*3/uL (ref 4.0–10.5)

## 2018-05-17 LAB — TSH: TSH: 1.44 u[IU]/mL (ref 0.35–4.50)

## 2018-05-17 LAB — BASIC METABOLIC PANEL
BUN: 17 mg/dL (ref 6–23)
CO2: 29 mEq/L (ref 19–32)
CREATININE: 0.8 mg/dL (ref 0.40–1.20)
Calcium: 9.9 mg/dL (ref 8.4–10.5)
Chloride: 104 mEq/L (ref 96–112)
GFR: 76.17 mL/min (ref >60.00)
Glucose, Bld: 96 mg/dL (ref 70–99)
Potassium: 4.2 mEq/L (ref 3.5–5.1)
Sodium: 140 mEq/L (ref 135–145)

## 2018-05-17 LAB — HEPATIC FUNCTION PANEL
ALT: 23 U/L (ref 0–35)
AST: 22 U/L (ref 0–37)
Albumin: 4.5 g/dL (ref 3.5–5.2)
Alkaline Phosphatase: 88 U/L (ref 39–117)
BILIRUBIN TOTAL: 0.7 mg/dL (ref 0.2–1.2)
Bilirubin, Direct: 0.1 mg/dL (ref 0.0–0.3)
Total Protein: 7.3 g/dL (ref 6.0–8.3)

## 2018-05-17 LAB — LIPID PANEL
Cholesterol: 162 mg/dL (ref 0–200)
HDL: 64.2 mg/dL (ref >39.00)
LDL CALC: 80 mg/dL (ref 0–99)
NonHDL: 97.49
Total CHOL/HDL Ratio: 3
Triglycerides: 88 mg/dL (ref 0.0–149.0)
VLDL: 17.6 mg/dL (ref 0.0–40.0)

## 2018-05-17 NOTE — Patient Instructions (Signed)
Schedule your complete physical in 6 months We'll notify you of your lab results and make any changes if needed Continue to work on healthy diet and regular exercise- you're doing great! If the stress and anxiety is too much- let me know! If the palpitations or shortness of breath continues- let me know! Call with any questions or concerns Hang in there!!!

## 2018-05-17 NOTE — Assessment & Plan Note (Signed)
New.  Appropriate given all of her recent issues and new dx.  She is not interested in medication at this time and we discussed possible counseling going forward.  She appreciated having someone validate her concerns and emotions.  Will continue to follow.

## 2018-05-17 NOTE — Assessment & Plan Note (Signed)
Chronic problem.  Tolerating statin w/o difficulty.  Encouraged healthy diet and regular exercise.  Check labs.  Adjust meds prn. 

## 2018-05-17 NOTE — Progress Notes (Signed)
   Subjective:    Patient ID: [name] [name], female    DOB: [birth year], 67 y.o.   MRN: [id number]  HPI Hyperlipidemia- chronic problem, on Simvastatin 20mg  daily.  Pt is attempting to walk regularly.  Denies CP, HAs, abd pain, N/V.  + SOB- gets winded climbing [place] 3rd [place] beach  Hypothyroid- on NP thyroid 60mg  daily.  Some palpitations w/ prednisone taper.  Denies excessive fatigue.  Anxiety- pt was dx'd w/ glaucoma yesterday, last month was told she has bulging disc and bone spur in neck and has appt pending w/ surgeon.  She is feeling very overwhelmed by all of her new medical issues.  Review of Systems For ROS see HPI     Objective:   Physical Exam  Constitutional: She is oriented to person, place, and time. She appears well-developed and well-nourished. No distress.  HENT:  Head: Normocephalic and atraumatic.  Eyes: Pupils are equal, round, and reactive to light. Conjunctivae and EOM are normal.  Neck: Normal range of motion. Neck supple. No thyromegaly present.  Cardiovascular: Normal rate, regular rhythm, normal heart sounds and intact distal pulses.  No murmur heard. Pulmonary/Chest: Effort normal and breath sounds normal. No respiratory distress.  Abdominal: Soft. She exhibits no distension. There is no tenderness.  Musculoskeletal: She exhibits no edema.  Lymphadenopathy:    She has no cervical adenopathy.  Neurological: She is alert and oriented to person, place, and time.  Skin: Skin is warm and dry.  Psychiatric: Her behavior is normal.  Appropriately tearful when talking about feeling overwhelmed  Vitals reviewed.         Assessment & Plan:

## 2018-05-17 NOTE — Assessment & Plan Note (Signed)
Chronic problem.  Reports some palpitations since starting prednisone taper.  Check labs.  Adjust meds prn

## 2018-05-18 ENCOUNTER — Encounter: Payer: Self-pay | Admitting: Family Medicine

## 2018-06-19 DIAGNOSIS — R29898 Other symptoms and signs involving the musculoskeletal system: Secondary | ICD-10-CM | POA: Diagnosis not present

## 2018-06-26 DIAGNOSIS — B078 Other viral warts: Secondary | ICD-10-CM | POA: Diagnosis not present

## 2018-06-26 DIAGNOSIS — L570 Actinic keratosis: Secondary | ICD-10-CM | POA: Diagnosis not present

## 2018-06-26 DIAGNOSIS — L218 Other seborrheic dermatitis: Secondary | ICD-10-CM | POA: Diagnosis not present

## 2018-06-26 DIAGNOSIS — G540 Brachial plexus disorders: Secondary | ICD-10-CM | POA: Diagnosis not present

## 2018-06-26 DIAGNOSIS — G5621 Lesion of ulnar nerve, right upper limb: Secondary | ICD-10-CM | POA: Diagnosis not present

## 2018-06-26 DIAGNOSIS — L820 Inflamed seborrheic keratosis: Secondary | ICD-10-CM | POA: Diagnosis not present

## 2018-06-26 DIAGNOSIS — Z85820 Personal history of malignant melanoma of skin: Secondary | ICD-10-CM | POA: Diagnosis not present

## 2018-06-26 DIAGNOSIS — G5601 Carpal tunnel syndrome, right upper limb: Secondary | ICD-10-CM | POA: Diagnosis not present

## 2018-06-26 DIAGNOSIS — Z08 Encounter for follow-up examination after completed treatment for malignant neoplasm: Secondary | ICD-10-CM | POA: Diagnosis not present

## 2018-06-26 DIAGNOSIS — D485 Neoplasm of uncertain behavior of skin: Secondary | ICD-10-CM | POA: Diagnosis not present

## 2018-07-11 DIAGNOSIS — G540 Brachial plexus disorders: Secondary | ICD-10-CM | POA: Diagnosis not present

## 2018-07-13 ENCOUNTER — Other Ambulatory Visit: Payer: Self-pay

## 2018-07-13 ENCOUNTER — Ambulatory Visit: Payer: Medicare HMO | Attending: Neurological Surgery | Admitting: Physical Therapy

## 2018-07-13 DIAGNOSIS — M6281 Muscle weakness (generalized): Secondary | ICD-10-CM | POA: Insufficient documentation

## 2018-07-13 DIAGNOSIS — R600 Localized edema: Secondary | ICD-10-CM | POA: Diagnosis not present

## 2018-07-13 NOTE — Therapy (Signed)
[facility] [address]  [place], [place], [address] Phone: [phone]   Fax:  [phone]  Physical Therapy Evaluation  Patient Details  Name: [name] [name] MRN: [id number] Date of Birth: June 05, 1951 Referring Provider (PT): Eustace Moore, MD   Encounter Date: 07/13/2018  PT End of Session - 07/13/18 0803    Visit Number  1    Number of Visits  12    Date for PT Re-Evaluation  08/24/18    Authorization Type  [employer]    PT Start Time  0803    PT Stop Time  0848    PT Time Calculation (min)  45 min    Activity Tolerance  Patient tolerated treatment well    Behavior During Therapy  [facility] for tasks assessed/performed       Past Medical History:  Diagnosis Date  . Allergy, unspecified not elsewhere classified   . Borderline hypertension   . Bronchitis   . DJD (degenerative joint disease)   . GERD (gastroesophageal reflux disease)   . History of skin cancer   . Hypercholesterolemia   . Hypothyroidism   . IBS (irritable bowel syndrome)   . OSA (obstructive sleep apnea) 12/27/2017  . Plantar fasciitis   . PONV (postoperative nausea and vomiting)   . Postoperative nausea and vomiting 09/02/2015  . Primary localized osteoarthritis of left knee   . Skin cancer (melanoma) (HCC)    basil cell   . Vitamin D deficiency     Past Surgical History:  Procedure Laterality Date  . BUNIONECTOMY  2004   left  . COLONOSCOPY    . FOOT OSTEOTOMY  2003   right  . HERNIA REPAIR  1957  . KNEE ARTHROSCOPY  1987&2003   left  . MELANOMA EXCISION Right    shoulder  . SHOULDER ARTHROSCOPY WITH OPEN ROTATOR CUFF REPAIR  2013   right  . SKIN SURGERY  4/05   Melanoma surg left shoulder  . TOTAL KNEE ARTHROPLASTY Left 09/01/2015   Procedure: TOTAL KNEE ARTHROPLASTY;  Surgeon: Elsie Saas, MD;  Location: [facility];  Service: Orthopedics;  Laterality: Left;  . TUBAL LIGATION  1990    There were no vitals filed for  this visit.   Subjective Assessment - 07/13/18 0808    Subjective  Pt reports she initially had R shoulder pain progressing into R arm pain, then numbness along pinky side of forearm, starting in early July. Recieved steroids starting July 9th with resolution of pain and numbness but still experiences some intermittent tingling and for past 2 days has had swelling over dorsal surface of wrist. Notes atrophy of thenar eminence which she says MD told her will never come back.    Limitations  House hold activities    Patient Stated Goals  "total usage of fingers and hand back"    Currently in Pain?  No/denies    Pain Score  0-No pain    Pain Location  Wrist    Pain Orientation  Right    Pain Descriptors / Indicators  --   "swollen"   Pain Type  Acute pain    Pain Radiating Towards  rare intermittent tingling in R wrist and hand - mostly resolved    Pain Onset  More than a month ago   early July 2019   Pain Frequency  Intermittent         [facility] PT Assessment - 07/13/18 7793  Assessment   Medical Diagnosis  R hand weakness    Referring Provider (PT)  Eustace Moore, MD    Onset Date/Surgical Date  04/03/18    Hand Dominance  Right    Next MD Visit  none scheduled    Prior Therapy  PT after prior knee surgery, R shoulder labral tear & for back after a fall      Balance Screen   Has the patient fallen in the past 6 months  No    Has the patient had a decrease in activity level because of a fear of falling?   No    Is the patient reluctant to leave their home because of a fear of falling?   No      Prior Function   Level of Independence  Independent    Vocation  Retired    Leisure  traveling, aims for 5000 steps/day      Cognition   Overall Cognitive Status  Within Functional Limits for tasks assessed      Sensation   Light Touch  Appears Intact    Stereognosis  Appears Intact    Hot/Cold  Appears Intact      Coordination   Gross Motor Movements are Fluid and  Coordinated  Yes    Fine Motor Movements are Fluid and Coordinated  Yes      ROM / Strength   AROM / PROM / Strength  AROM;Strength      AROM   Overall AROM   Within functional limits for tasks performed    AROM Assessment Site  Forearm;Wrist;Finger;Thumb      Strength   Strength Assessment Site  Shoulder;Elbow;Forearm;Wrist;Hand    Right/Left Shoulder  Right;Left    Right Shoulder Flexion  5/5    Right Shoulder Extension  5/5    Right Shoulder ABduction  5/5    Right Shoulder Internal Rotation  5/5    Right Shoulder External Rotation  5/5    Left Shoulder Flexion  5/5    Left Shoulder Extension  5/5    Left Shoulder ABduction  5/5    Left Shoulder Internal Rotation  5/5    Left Shoulder External Rotation  5/5    Right/Left Elbow  Right;Left    Right Elbow Flexion  4+/5    Right Elbow Extension  4/5    Left Elbow Flexion  5/5    Left Elbow Extension  5/5    Right/Left Forearm  Right;Left    Right Forearm Pronation  4+/5    Right Forearm Supination  4+/5    Left Forearm Pronation  5/5    Left Forearm Supination  5/5    Right/Left Wrist  Right;Left    Right Wrist Flexion  4/5    Right Wrist Extension  4/5    Right Wrist Radial Deviation  4/5    Right Wrist Ulnar Deviation  4/5    Left Wrist Flexion  4+/5    Left Wrist Extension  4+/5    Left Wrist Radial Deviation  4+/5    Left Wrist Ulnar Deviation  4+/5    Right/Left hand  Right;Left    Right Hand Gross Grasp  Functional    Right Hand Grip (lbs)  29.3   32, 28, 28   Right Hand Lateral Pinch  4.3 lbs   4, 4, 5   Right Hand 3 Point Pinch  7 lbs   7, 7, 7   Left Hand Gross Grasp  Functional  Left Hand Grip (lbs)  47.7   52, 49, 42   Left Hand Lateral Pinch  12 lbs   13, 12, 11   Left Hand 3 Point Pinch  9.3 lbs   10, 9, 9     Palpation   Palpation comment  ttp with taut band in R wrist wrist extensor group                Objective measurements completed on examination: See above findings.       [facility] Adult PT Treatment/Exercise - 07/13/18 0803      Exercises   Exercises  Hand      Hand Exercises   Other Hand Exercises  Yellow putty exercises per HEP handout             PT Education - 07/13/18 1303    Education Details  PT eval findings, anticipated POC & initial HEP with yellow putty    Person(s) Educated  Patient    Methods  Explanation;Demonstration;Handout    Comprehension  Verbalized understanding;Returned demonstration       PT Short Term Goals - 07/13/18 0848      PT SHORT TERM GOAL #1   Title  Independent with initial HEP    Status  New    Target Date  08/03/18        PT Long Term Goals - 07/13/18 0848      PT LONG TERM GOAL #1   Title  Independent with ongoing HEP    Status  New    Target Date  08/24/18      PT LONG TERM GOAL #2   Title  R gross grip strength within 5# of L hand    Status  New    Target Date  08/24/18      PT LONG TERM GOAL #3   Title  R lateral pinch strength within 3# of L hand    Status  New    Target Date  08/24/18      PT LONG TERM GOAL #4   Title  Pt will report ability to use R hand functionally with daily tasks such as lifting light to medium weight objects or starting car    Status  New    Target Date  08/24/18             Plan - 07/13/18 0848    Clinical Impression Statement  Tenesia is a 67 y/o female who presents to OP PT for R hand weakness due to [place]-Turner Syndrome/brachial plexus neuritis. Pt reports pain and numbess/tingling in R UE initially experienced has now resolved s/p steroids, but still has edema in R wrist and atrophy of thenar eminence (which MD has told her will be permanent) with overall weakness and decreased functional use of R hand.    Clinical Presentation  Stable    Clinical Decision Making  Low    Rehab Potential  Good    PT Frequency  2x / week    PT Duration  6 weeks    PT Treatment/Interventions  Patient/family education;ADLs/Self Care Home  Management;Therapeutic exercise;Therapeutic activities;Electrical Stimulation;Ultrasound;Iontophoresis 4mg /ml Dexamethasone;Moist Heat;Cryotherapy;Manual techniques;Passive range of motion;Dry needling;Taping    PT Home Exercise Plan  putty exercises    Consulted and Agree with Plan of Care  Patient       Patient will benefit from skilled therapeutic intervention in order to improve the following deficits and impairments:  Decreased strength, Impaired UE functional use, Increased edema, Increased muscle spasms  Visit Diagnosis: Muscle weakness (generalized)  Localized edema     Problem List Patient Active Problem List   Diagnosis Date Noted  . Anxiety about health 05/17/2018  . OSA (obstructive sleep apnea) 12/27/2017  . Hx of adenomatous colonic polyps 01/17/2017  . Postoperative nausea and vomiting 09/02/2015  . Primary localized osteoarthritis of left knee   . IBS (irritable bowel syndrome)   . Physical exam 05/30/2015  . Hypothyroidism 11/22/2014  . Vitamin D deficiency 09/21/2009  . Hyperlipidemia 09/21/2009  . SKIN CANCER, HX OF 09/15/2009  . Anxiety state 12/19/2007  . GERD 12/19/2007  . ALLERGY 12/19/2007    Percival Spanish, PT, MPT 07/13/2018, 7:22 PM  [facility] [address]  [place], [place], [address] Phone: [phone]   Fax:  [phone]  Name: KEELIN NEVILLE MRN: [id number] Date of Birth: Sep 16, 1951

## 2018-07-17 DIAGNOSIS — Z961 Presence of intraocular lens: Secondary | ICD-10-CM | POA: Diagnosis not present

## 2018-07-17 DIAGNOSIS — H2511 Age-related nuclear cataract, right eye: Secondary | ICD-10-CM | POA: Diagnosis not present

## 2018-07-17 DIAGNOSIS — H52201 Unspecified astigmatism, right eye: Secondary | ICD-10-CM | POA: Diagnosis not present

## 2018-07-18 DIAGNOSIS — H2512 Age-related nuclear cataract, left eye: Secondary | ICD-10-CM | POA: Diagnosis not present

## 2018-07-26 ENCOUNTER — Ambulatory Visit: Payer: Medicare HMO | Admitting: Physical Therapy

## 2018-07-26 ENCOUNTER — Encounter: Payer: Self-pay | Admitting: Physical Therapy

## 2018-07-26 DIAGNOSIS — R600 Localized edema: Secondary | ICD-10-CM | POA: Diagnosis not present

## 2018-07-26 DIAGNOSIS — M6281 Muscle weakness (generalized): Secondary | ICD-10-CM

## 2018-07-26 NOTE — Therapy (Signed)
[facility] [address]  [place], [place], [address] Phone: [phone]   Fax:  [phone]  Physical Therapy Treatment  Patient Details  Name: [name] [name] MRN: [id number] Date of Birth: 06/03/1951 Referring Provider (PT): [name] Moore, MD   Encounter Date: 07/26/2018  PT End of Session - 07/26/18 0800    Visit Number  2    Number of Visits  12    Date for PT Re-Evaluation  08/24/18    Authorization Type  [employer]    PT Start Time  0800    PT Stop Time  0842    PT Time Calculation (min)  42 min    Activity Tolerance  Patient tolerated treatment well    Behavior During Therapy  [facility] for tasks assessed/performed       Past Medical History:  Diagnosis Date  . Allergy, unspecified not elsewhere classified   . Borderline hypertension   . Bronchitis   . DJD (degenerative joint disease)   . GERD (gastroesophageal reflux disease)   . History of skin cancer   . Hypercholesterolemia   . Hypothyroidism   . IBS (irritable bowel syndrome)   . OSA (obstructive sleep apnea) 12/27/2017  . Plantar fasciitis   . PONV (postoperative nausea and vomiting)   . Postoperative nausea and vomiting 09/02/2015  . Primary localized osteoarthritis of left knee   . Skin cancer (melanoma) (HCC)    basil cell   . Vitamin D deficiency     Past Surgical History:  Procedure Laterality Date  . BUNIONECTOMY  2004   left  . COLONOSCOPY    . FOOT OSTEOTOMY  2003   right  . HERNIA REPAIR  1957  . KNEE ARTHROSCOPY  1987&2003   left  . MELANOMA EXCISION Right    shoulder  . SHOULDER ARTHROSCOPY WITH OPEN ROTATOR CUFF REPAIR  2013   right  . SKIN SURGERY  4/05   Melanoma surg left shoulder  . TOTAL KNEE ARTHROPLASTY Left 09/01/2015   Procedure: TOTAL KNEE ARTHROPLASTY;  Surgeon: [name] Saas, MD;  Location: [facility];  Service: Orthopedics;  Laterality: Left;  . TUBAL LIGATION  1990    There were no vitals filed for  this visit.  Subjective Assessment - 07/26/18 0804    Subjective  Pt reporting no pain today - just frustration at wanting her strength back. No issues with HEP other than difficulty with thumb flexion into putty.    Limitations  House hold activities    Patient Stated Goals  "total usage of fingers and hand back"    Currently in Pain?  No/denies    Pain Onset  More than a month ago   early July 2019                      [facility] Adult PT Treatment/Exercise - 07/26/18 0800      Exercises   Exercises  Wrist;Hand      Shoulder Exercises: ROM/Strengthening   UBE (Upper Arm Bike)  L2.0 x 6 min (3' fwd/3'back)      Hand Exercises   PIPJ Extension  Right;15 reps    PIPJ Extension Limitations  figure 8 green rubberband    DIPJ Extension  Right;10 reps    DIPJ Extension Limitations  blue rubberband at distal end of fingers - isolating each finger individually    Digit Composite ABduction  Right;10 reps;Seated    Digit Composite ABduction Limitations  red rubberband    Digit Abduction/Adduction  R hand isolated digit abduction with black rubberband x10 each    Digiticizer  R hand - Yellow (1.5#) isolating each finger then full hand squeeze x10    Other Hand Exercises  Clothespins - 3 point pinch to largest diameter (top) bar, then lateral pinch from bar to upright pole - 1 set each with yellow, red, green then blue clothespins             PT Education - 07/26/18 0843    Education Details  HEP update - rubberband exercises    Person(s) Educated  Patient    Methods  Explanation;Demonstration;Handout    Comprehension  Verbalized understanding;Returned demonstration       PT Short Term Goals - 07/26/18 0807      PT SHORT TERM GOAL #1   Title  Independent with initial HEP    Status  Achieved        PT Long Term Goals - 07/26/18 3235      PT LONG TERM GOAL #1   Title  Independent with ongoing HEP    Status  On-going      PT LONG TERM GOAL #2   Title  R  gross grip strength within 5# of L hand    Status  On-going      PT LONG TERM GOAL #3   Title  R lateral pinch strength within 3# of L hand    Status  On-going      PT LONG TERM GOAL #4   Title  Pt will report ability to use R hand functionally with daily tasks such as lifting light to medium weight objects or starting car    Status  On-going            Plan - 07/26/18 0808    Clinical Impression Statement  [name] [name] reporting no issues with putty HEPs other than difficulty aligning R thumb with rest of hand for flexion & key/lateral grip exercises into putty. Progressed exercises to further address these motions as well as target digit extension & abduction motions using rubberbands with good tolerance other than fatiuge noted - HEP updated to include additonal exercises.    Rehab Potential  Good    PT Treatment/Interventions  Patient/family education;ADLs/Self Care Home Management;Therapeutic exercise;Therapeutic activities;Electrical Stimulation;Ultrasound;Iontophoresis 4mg /ml Dexamethasone;Moist Heat;Cryotherapy;Manual techniques;Passive range of motion;Dry needling;Taping    PT Home Exercise Plan  putty exercises; rubberband exercises    Consulted and Agree with Plan of Care  Patient       Patient will benefit from skilled therapeutic intervention in order to improve the following deficits and impairments:  Decreased strength, Impaired UE functional use, Increased edema, Increased muscle spasms  Visit Diagnosis: Muscle weakness (generalized)  Localized edema     Problem List Patient Active Problem List   Diagnosis Date Noted  . Anxiety about health 05/17/2018  . OSA (obstructive sleep apnea) 12/27/2017  . Hx of adenomatous colonic polyps 01/17/2017  . Postoperative nausea and vomiting 09/02/2015  . Primary localized osteoarthritis of left knee   . IBS (irritable bowel syndrome)   . Physical exam 05/30/2015  . Hypothyroidism 11/22/2014  . Vitamin D deficiency  09/21/2009  . Hyperlipidemia 09/21/2009  . SKIN CANCER, HX OF 09/15/2009  . Anxiety state 12/19/2007  . GERD 12/19/2007  . ALLERGY 12/19/2007    Percival Spanish, PT, MPT 07/26/2018, 11:32 AM  [facility] [address]  [address] [place],  [place], [address] Phone: [phone]   Fax:  [phone]  Name: [name] [name] MRN: [id number] Date of Birth: 07/09/1951

## 2018-07-27 ENCOUNTER — Other Ambulatory Visit: Payer: Self-pay | Admitting: Family Medicine

## 2018-08-07 DIAGNOSIS — H2512 Age-related nuclear cataract, left eye: Secondary | ICD-10-CM | POA: Diagnosis not present

## 2018-08-07 DIAGNOSIS — Z961 Presence of intraocular lens: Secondary | ICD-10-CM | POA: Diagnosis not present

## 2018-08-07 DIAGNOSIS — H52202 Unspecified astigmatism, left eye: Secondary | ICD-10-CM | POA: Diagnosis not present

## 2018-08-09 ENCOUNTER — Ambulatory Visit: Payer: Medicare HMO | Attending: Neurological Surgery

## 2018-08-09 DIAGNOSIS — M6281 Muscle weakness (generalized): Secondary | ICD-10-CM | POA: Insufficient documentation

## 2018-08-09 DIAGNOSIS — Z1231 Encounter for screening mammogram for malignant neoplasm of breast: Secondary | ICD-10-CM | POA: Diagnosis not present

## 2018-08-09 DIAGNOSIS — R600 Localized edema: Secondary | ICD-10-CM | POA: Insufficient documentation

## 2018-08-09 LAB — HM MAMMOGRAPHY

## 2018-08-09 NOTE — Therapy (Signed)
[facility] [address]  [place], [place], [address] Phone: [phone]   Fax:  [phone]  Physical Therapy Treatment  Patient Details  Name: [name] [name] MRN: [id number] Date of Birth: October 12, 1950 Referring Provider (PT): Eustace Moore, MD   Encounter Date: 08/09/2018  PT End of Session - 08/09/18 0853    Visit Number  3    Number of Visits  12    Date for PT Re-Evaluation  08/24/18    Authorization Type  [employer]    PT Start Time  0850    PT Stop Time  0929    PT Time Calculation (min)  39 min    Activity Tolerance  Patient tolerated treatment well    Behavior During Therapy  [facility] for tasks assessed/performed       Past Medical History:  Diagnosis Date  . Allergy, unspecified not elsewhere classified   . Borderline hypertension   . Bronchitis   . DJD (degenerative joint disease)   . GERD (gastroesophageal reflux disease)   . History of skin cancer   . Hypercholesterolemia   . Hypothyroidism   . IBS (irritable bowel syndrome)   . OSA (obstructive sleep apnea) 12/27/2017  . Plantar fasciitis   . PONV (postoperative nausea and vomiting)   . Postoperative nausea and vomiting 09/02/2015  . Primary localized osteoarthritis of left knee   . Skin cancer (melanoma) (HCC)    basil cell   . Vitamin D deficiency     Past Surgical History:  Procedure Laterality Date  . BUNIONECTOMY  2004   left  . COLONOSCOPY    . FOOT OSTEOTOMY  2003   right  . HERNIA REPAIR  1957  . KNEE ARTHROSCOPY  1987&2003   left  . MELANOMA EXCISION Right    shoulder  . SHOULDER ARTHROSCOPY WITH OPEN ROTATOR CUFF REPAIR  2013   right  . SKIN SURGERY  4/05   Melanoma surg left shoulder  . TOTAL KNEE ARTHROPLASTY Left 09/01/2015   Procedure: TOTAL KNEE ARTHROPLASTY;  Surgeon: Elsie Saas, MD;  Location: [facility];  Service: Orthopedics;  Laterality: Left;  . TUBAL LIGATION  1990    There were no vitals filed for  this visit.  Subjective Assessment - 08/09/18 0852    Subjective  Pt. denies pain today.  Notes she had surgery earlier this week.  No lifting >10#.      Patient Stated Goals  "total usage of fingers and hand back"    Currently in Pain?  No/denies    Pain Score  0-No pain    Multiple Pain Sites  No                       [facility] Adult PT Treatment/Exercise - 08/09/18 0857      Self-Care   Self-Care  Other Self-Care Comments    Other Self-Care Comments   Discussion of rationale behind NMES and what to expect before use       Hand Exercises   Digiticizer  R hand - Yellow (1.5#) isolating each finger then full hand squeeze x10    Other Hand Exercises  Clothespins - 3 point pinch to largest diameter (top) bar, then lateral pinch from bar to upright pole - 1 set each with yellow, red, green then blue, black clothespins      Electrical Stimulation   Electrical Stimulation Location  dorsal Interossei     Electrical Stimulation  Action  NMES    Electrical Stimulation Parameters  8 secon/5sec off time, 15'    Museum/gallery curator               PT Short Term Goals - 07/26/18 0807      PT SHORT TERM GOAL #1   Title  Independent with initial HEP    Status  Achieved        PT Long Term Goals - 07/26/18 1224      PT LONG TERM GOAL #1   Title  Independent with ongoing HEP    Status  On-going      PT LONG TERM GOAL #2   Title  R gross grip strength within 5# of L hand    Status  On-going      PT LONG TERM GOAL #3   Title  R lateral pinch strength within 3# of L hand    Status  On-going      PT LONG TERM GOAL #4   Title  Pt will report ability to use R hand functionally with daily tasks such as lifting light to medium weight objects or starting car    Status  On-going            Plan - 08/09/18 0853    Clinical Impression Statement  Pt. noting difficulty with turning car ignition and fatigue following prolonged writing.  Tolerated  progression of grip strengthening activities today well.  Initiated NMES training to hand musculature today with pt. able to demo improved activation with finger abduction activities with addition of NMES.  Pt. tolerated all activities in session well today and will plan to advance strengthening and NMES training in future visit per pt.        PT Treatment/Interventions  Patient/family education;ADLs/Self Care Home Management;Therapeutic exercise;Therapeutic activities;Electrical Stimulation;Ultrasound;Iontophoresis 4mg /ml Dexamethasone;Moist Heat;Cryotherapy;Manual techniques;Passive range of motion;Dry needling;Taping    Consulted and Agree with Plan of Care  Patient       Patient will benefit from skilled therapeutic intervention in order to improve the following deficits and impairments:  Decreased strength, Impaired UE functional use, Increased edema, Increased muscle spasms  Visit Diagnosis: Muscle weakness (generalized)  Localized edema     Problem List Patient Active Problem List   Diagnosis Date Noted  . Anxiety about health 05/17/2018  . OSA (obstructive sleep apnea) 12/27/2017  . Hx of adenomatous colonic polyps 01/17/2017  . Postoperative nausea and vomiting 09/02/2015  . Primary localized osteoarthritis of left knee   . IBS (irritable bowel syndrome)   . Physical exam 05/30/2015  . Hypothyroidism 11/22/2014  . Vitamin D deficiency 09/21/2009  . Hyperlipidemia 09/21/2009  . SKIN CANCER, HX OF 09/15/2009  . Anxiety state 12/19/2007  . GERD 12/19/2007  . ALLERGY 12/19/2007    Bess Harvest, PTA 08/09/18 1:25 PM   [facility] [address]  [place], [place], [address] Phone: [phone]   Fax:  [phone]  Name: [name] [name] MRN: [id number] Date of Birth: [birth year]

## 2018-08-11 ENCOUNTER — Ambulatory Visit: Payer: Medicare HMO

## 2018-08-11 ENCOUNTER — Encounter: Payer: Self-pay | Admitting: General Practice

## 2018-08-11 DIAGNOSIS — M6281 Muscle weakness (generalized): Secondary | ICD-10-CM | POA: Diagnosis not present

## 2018-08-11 DIAGNOSIS — R600 Localized edema: Secondary | ICD-10-CM

## 2018-08-11 NOTE — Therapy (Signed)
[facility] [address]  [place], [place], [address] Phone: [phone]   Fax:  [phone]  Physical Therapy Treatment  Patient Details  Name: [name] [name] MRN: [id number] Date of Birth: Jul 28, 1951 Referring Provider (PT): Eustace Moore, MD   Encounter Date: 08/11/2018  PT End of Session - 08/11/18 0855    Visit Number  4    Number of Visits  12    Date for PT Re-Evaluation  08/24/18    Authorization Type  [employer]    PT Start Time  0848    PT Stop Time  0930    PT Time Calculation (min)  42 min    Activity Tolerance  Patient tolerated treatment well    Behavior During Therapy  [facility] for tasks assessed/performed       Past Medical History:  Diagnosis Date  . Allergy, unspecified not elsewhere classified   . Borderline hypertension   . Bronchitis   . DJD (degenerative joint disease)   . GERD (gastroesophageal reflux disease)   . History of skin cancer   . Hypercholesterolemia   . Hypothyroidism   . IBS (irritable bowel syndrome)   . OSA (obstructive sleep apnea) 12/27/2017  . Plantar fasciitis   . PONV (postoperative nausea and vomiting)   . Postoperative nausea and vomiting 09/02/2015  . Primary localized osteoarthritis of left knee   . Skin cancer (melanoma) (HCC)    basil cell   . Vitamin D deficiency     Past Surgical History:  Procedure Laterality Date  . BUNIONECTOMY  2004   left  . COLONOSCOPY    . FOOT OSTEOTOMY  2003   right  . HERNIA REPAIR  1957  . KNEE ARTHROSCOPY  1987&2003   left  . MELANOMA EXCISION Right    shoulder  . SHOULDER ARTHROSCOPY WITH OPEN ROTATOR CUFF REPAIR  2013   right  . SKIN SURGERY  4/05   Melanoma surg left shoulder  . TOTAL KNEE ARTHROPLASTY Left 09/01/2015   Procedure: TOTAL KNEE ARTHROPLASTY;  Surgeon: Elsie Saas, MD;  Location: [place];  Service: Orthopedics;  Laterality: Left;  . TUBAL LIGATION  1990    There were no vitals filed for  this visit.  Subjective Assessment - 08/11/18 0854    Subjective  Pt. doing well today.  Felt fine after last visit.      Patient Stated Goals  "total usage of fingers and hand back"    Currently in Pain?  No/denies    Pain Score  0-No pain    Multiple Pain Sites  No                       [facility] Adult PT Treatment/Exercise - 08/11/18 0900      Hand Exercises   Digit Abduction/Adduction  R hand isolated digit abduction with black rubberband x 12 each    Digiticizer  R hand - red (3.0#) isolating each finger then full hand squeeze x10    Other Hand Exercises  Clothespins - 3 point pinch to largest diameter (top) bar, then lateral pinch from bar to upright pole - 1 set each with yellow, red, green then blue, black clothespins      Electrical Stimulation   Electrical Stimulation Location  Thenar musculature (with pt. AROM thumb opposition during "on" phase)    Electrical Stimulation Action  NMES    Electrical Stimulation Parameters  8sec on/5 sec off, 8'  Museum/gallery curator               PT Short Term Goals - 07/26/18 0807      PT SHORT TERM GOAL #1   Title  Independent with initial HEP    Status  Achieved        PT Long Term Goals - 07/26/18 4268      PT LONG TERM GOAL #1   Title  Independent with ongoing HEP    Status  On-going      PT LONG TERM GOAL #2   Title  R gross grip strength within 5# of L hand    Status  On-going      PT LONG TERM GOAL #3   Title  R lateral pinch strength within 3# of L hand    Status  On-going      PT LONG TERM GOAL #4   Title  Pt will report ability to use R hand functionally with daily tasks such as lifting light to medium weight objects or starting car    Status  On-going            Plan - 08/11/18 0856    Clinical Impression Statement  [name] [name] reports she felt fine after last visit.  Still noting most difficulty with tasks such as turning car key in ignition.  Session with progression  of intrinsic hand strengthening activities with progression of digitizer and progression of reps with band resisted finger abduction.  Pt. tolerated addition of NMES training to thenar musculature with AROM thumb opposition well with improved contraction of thenar musculature noted with addition of NMES.  Pt. ended visit pain free.  Will continue to progress toward goals.      PT Treatment/Interventions  Patient/family education;ADLs/Self Care Home Management;Therapeutic exercise;Therapeutic activities;Electrical Stimulation;Ultrasound;Iontophoresis 4mg /ml Dexamethasone;Moist Heat;Cryotherapy;Manual techniques;Passive range of motion;Dry needling;Taping    Consulted and Agree with Plan of Care  Patient       Patient will benefit from skilled therapeutic intervention in order to improve the following deficits and impairments:  Decreased strength, Impaired UE functional use, Increased edema, Increased muscle spasms  Visit Diagnosis: Muscle weakness (generalized)  Localized edema     Problem List Patient Active Problem List   Diagnosis Date Noted  . Anxiety about health 05/17/2018  . OSA (obstructive sleep apnea) 12/27/2017  . Hx of adenomatous colonic polyps 01/17/2017  . Postoperative nausea and vomiting 09/02/2015  . Primary localized osteoarthritis of left knee   . IBS (irritable bowel syndrome)   . Physical exam 05/30/2015  . Hypothyroidism 11/22/2014  . Vitamin D deficiency 09/21/2009  . Hyperlipidemia 09/21/2009  . SKIN CANCER, HX OF 09/15/2009  . Anxiety state 12/19/2007  . GERD 12/19/2007  . ALLERGY 12/19/2007    Bess Harvest, PTA 08/11/18 12:28 PM   [facility] [address]  [place], [place], [address] Phone: [phone]   Fax:  [phone]  Name: [name] [name] MRN: [id number] Date of Birth: 1951/07/20

## 2018-08-14 ENCOUNTER — Ambulatory Visit: Payer: Medicare HMO | Admitting: Physical Therapy

## 2018-08-14 DIAGNOSIS — R600 Localized edema: Secondary | ICD-10-CM | POA: Diagnosis not present

## 2018-08-14 DIAGNOSIS — M6281 Muscle weakness (generalized): Secondary | ICD-10-CM

## 2018-08-14 NOTE — Therapy (Addendum)
[facility] [address]  [place], [place], [address] Phone: [phone]   Fax:  [phone]  Physical Therapy Treatment  Patient Details  Name: [name] [name] MRN: [id number] Date of Birth: 01/15/1951 Referring Provider (PT): Eustace Moore, MD   Encounter Date: 08/14/2018  PT End of Session - 08/14/18 0846    Visit Number  5    Number of Visits  12    Date for PT Re-Evaluation  08/24/18    Authorization Type  [employer]    PT Start Time  0846    PT Stop Time  0927    PT Time Calculation (min)  41 min    Activity Tolerance  Patient tolerated treatment well    Behavior During Therapy  [facility] for tasks assessed/performed       Past Medical History:  Diagnosis Date  . Allergy, unspecified not elsewhere classified   . Borderline hypertension   . Bronchitis   . DJD (degenerative joint disease)   . GERD (gastroesophageal reflux disease)   . History of skin cancer   . Hypercholesterolemia   . Hypothyroidism   . IBS (irritable bowel syndrome)   . OSA (obstructive sleep apnea) 12/27/2017  . Plantar fasciitis   . PONV (postoperative nausea and vomiting)   . Postoperative nausea and vomiting 09/02/2015  . Primary localized osteoarthritis of left knee   . Skin cancer (melanoma) (HCC)    basil cell   . Vitamin D deficiency     Past Surgical History:  Procedure Laterality Date  . BUNIONECTOMY  2004   left  . COLONOSCOPY    . FOOT OSTEOTOMY  2003   right  . HERNIA REPAIR  1957  . KNEE ARTHROSCOPY  1987&2003   left  . MELANOMA EXCISION Right    shoulder  . SHOULDER ARTHROSCOPY WITH OPEN ROTATOR CUFF REPAIR  2013   right  . SKIN SURGERY  4/05   Melanoma surg left shoulder  . TOTAL KNEE ARTHROPLASTY Left 09/01/2015   Procedure: TOTAL KNEE ARTHROPLASTY;  Surgeon: Elsie Saas, MD;  Location: [facility];  Service: Orthopedics;  Laterality: Left;  . TUBAL LIGATION  1990    There were no vitals filed for  this visit.                    [facility] Adult PT Treatment/Exercise - 08/14/18 0001      Hand Exercises   Digit Composite ABduction  Right;10 reps;Seated    Digit Composite ABduction Limitations  white rubberband    Digit Abduction/Adduction  R hand isolated digit abduction with black rubberband x10 each      Electrical Stimulation   Electrical Stimulation Location  Thenar musculature working on thumb abduction & opposition & dorsal Interossei working on finger abduction    Electrical Stimulation Action  NMES    Electrical Stimulation Parameters  10 sec on/5 sec off, x 15 min    Electrical Stimulation Goals  Strength             PT Education - 08/14/18 [phone]    Education Details  HEP update - additional theraputty exercises (red theraputty provided for home as tolerated)    Person(s) Educated  Patient    Methods  Explanation;Handout    Comprehension  Verbalized understanding       PT Short Term Goals - 07/26/18 0807      PT SHORT TERM GOAL #1   Title  Independent with  initial HEP    Status  Achieved        PT Long Term Goals - 07/26/18 0355      PT LONG TERM GOAL #1   Title  Independent with ongoing HEP    Status  On-going      PT LONG TERM GOAL #2   Title  R gross grip strength within 5# of L hand    Status  On-going      PT LONG TERM GOAL #3   Title  R lateral pinch strength within 3# of L hand    Status  On-going      PT LONG TERM GOAL #4   Title  Pt will report ability to use R hand functionally with daily tasks such as lifting light to medium weight objects or starting car    Status  On-going            Plan - 08/14/18 0930    Clinical Impression Statement  Colena noting improving strength since start of PT but still feels most limited with activities requiring lateral pinch and turning such as turning a key. Able to progress theraputty resistance to red putty and added finger extension and abduction exercises as well as key pinch, 3  point pinch and lateral grasp with putty to HEP. Continued use NMES to promote increased muscle activation during ROM/strengthening exercises with good tolerance.    Rehab Potential  Good    PT Treatment/Interventions  Patient/family education;ADLs/Self Care Home Management;Therapeutic exercise;Therapeutic activities;Electrical Stimulation;Ultrasound;Iontophoresis 4mg /ml Dexamethasone;Moist Heat;Cryotherapy;Manual techniques;Passive range of motion;Dry needling;Taping    Consulted and Agree with Plan of Care  Patient       Patient will benefit from skilled therapeutic intervention in order to improve the following deficits and impairments:  Decreased strength, Impaired UE functional use, Increased edema, Increased muscle spasms  Visit Diagnosis: Muscle weakness (generalized)  Localized edema     Problem List Patient Active Problem List   Diagnosis Date Noted  . Anxiety about health 05/17/2018  . OSA (obstructive sleep apnea) 12/27/2017  . Hx of adenomatous colonic polyps 01/17/2017  . Postoperative nausea and vomiting 09/02/2015  . Primary localized osteoarthritis of left knee   . IBS (irritable bowel syndrome)   . Physical exam 05/30/2015  . Hypothyroidism 11/22/2014  . Vitamin D deficiency 09/21/2009  . Hyperlipidemia 09/21/2009  . SKIN CANCER, HX OF 09/15/2009  . Anxiety state 12/19/2007  . GERD 12/19/2007  . ALLERGY 12/19/2007    Percival Spanish, PT, MPT 08/14/2018, 11:50 AM  [facility] [address]  [place], [place], [address] Phone: [phone]   Fax:  [phone]  Name: [name] [name] MRN: [id number] Date of Birth: [birth year]

## 2018-08-17 ENCOUNTER — Encounter: Payer: Self-pay | Admitting: Physical Therapy

## 2018-08-17 ENCOUNTER — Ambulatory Visit: Payer: Medicare HMO | Admitting: Physical Therapy

## 2018-08-17 DIAGNOSIS — R600 Localized edema: Secondary | ICD-10-CM

## 2018-08-17 DIAGNOSIS — M6281 Muscle weakness (generalized): Secondary | ICD-10-CM | POA: Diagnosis not present

## 2018-08-17 NOTE — Therapy (Signed)
[facility] [address]  [place], [place], [address] Phone: [phone]   Fax:  [phone]  Physical Therapy Treatment  Patient Details  Name: [name] [name] MRN: [id number] Date of Birth: Jul 24, 1951 Referring Provider (PT): Eustace Moore, MD   Encounter Date: 08/17/2018  PT End of Session - 08/17/18 0848    Visit Number  6    Number of Visits  12    Date for PT Re-Evaluation  08/24/18    Authorization Type  [employer]    PT Start Time  8841    PT Stop Time  0933    PT Time Calculation (min)  49 min    Activity Tolerance  Patient tolerated treatment well    Behavior During Therapy  [facility] for tasks assessed/performed       Past Medical History:  Diagnosis Date  . Allergy, unspecified not elsewhere classified   . Borderline hypertension   . Bronchitis   . DJD (degenerative joint disease)   . GERD (gastroesophageal reflux disease)   . History of skin cancer   . Hypercholesterolemia   . Hypothyroidism   . IBS (irritable bowel syndrome)   . OSA (obstructive sleep apnea) 12/27/2017  . Plantar fasciitis   . PONV (postoperative nausea and vomiting)   . Postoperative nausea and vomiting 09/02/2015  . Primary localized osteoarthritis of left knee   . Skin cancer (melanoma) (HCC)    basil cell   . Vitamin D deficiency     Past Surgical History:  Procedure Laterality Date  . BUNIONECTOMY  2004   left  . COLONOSCOPY    . FOOT OSTEOTOMY  2003   right  . HERNIA REPAIR  1957  . KNEE ARTHROSCOPY  1987&2003   left  . MELANOMA EXCISION Right    shoulder  . SHOULDER ARTHROSCOPY WITH OPEN ROTATOR CUFF REPAIR  2013   right  . SKIN SURGERY  4/05   Melanoma surg left shoulder  . TOTAL KNEE ARTHROPLASTY Left 09/01/2015   Procedure: TOTAL KNEE ARTHROPLASTY;  Surgeon: Elsie Saas, MD;  Location: [facility];  Service: Orthopedics;  Laterality: Left;  . TUBAL LIGATION  1990    There were no vitals filed for  this visit.  Subjective Assessment - 08/17/18 0847    Subjective  Pt reporting "that pink putty was pretty tough".    Patient Stated Goals  "total usage of fingers and hand back"    Currently in Pain?  No/denies                       [facility] Adult PT Treatment/Exercise - 08/17/18 0844      Exercises   Exercises  Wrist;Hand      Elbow Exercises   Forearm Supination  Right;10 reps;Bar weights/barbell;Strengthening;Seated    Bar Weights/Barbell (Forearm Supination)  2 lbs    Forearm Pronation  Right;10 reps;Bar weights/barbell;Strengthening;Seated    Bar Weights/Barbell (Forearm Pronation)  2 lbs      Shoulder Exercises: ROM/Strengthening   UBE (Upper Arm Bike)  L2.0 x 6 min (3' fwd/3'back)      Hand Exercises   Digiticizer  R hand - Red (3.0#) isolating each finger then full hand squeeze x10    Other Hand Exercises  Stovall hand grip 25# x10; 35# x10    Other Hand Exercises  Clothespins - 3 point pinch to largest diameter (top) bar, then lateral pinch from bar to upright pole - 1  set each with red, green then blue, black clothespins      Wrist Exercises   Wrist Flexion  Right;10 reps;Bar weights/barbell;Strengthening;Seated    Bar Weights/Barbell (Wrist Flexion)  2 lbs    Wrist Extension  Right;10 reps;Bar weights/barbell;Strengthening;Seated    Bar Weights/Barbell (Wrist Extension)  2 lbs    Wrist Radial Deviation  Right;10 reps;Bar weights/barbell;Strengthening;Seated    Bar Weights/Barbell (Radial Deviation)  2 lbs    Wrist Ulnar Deviation  Right;10 reps;Bar weights/barbell;Strengthening;Seated    Bar Weights/Barbell (Ulnar Deviation)  2 lbs    Other wrist exercises  B wrist rotation with tethered yellow TB on PVC x 40 revolutions in each direction             PT Education - 08/17/18 0930    Education Details  HEP update - wrist/forearm strengthening    Person(s) Educated  Patient    Methods  Explanation;Demonstration;Handout    Comprehension   Verbalized understanding;Returned demonstration       PT Short Term Goals - 07/26/18 0807      PT SHORT TERM GOAL #1   Title  Independent with initial HEP    Status  Achieved        PT Long Term Goals - 07/26/18 0109      PT LONG TERM GOAL #1   Title  Independent with ongoing HEP    Status  On-going      PT LONG TERM GOAL #2   Title  R gross grip strength within 5# of L hand    Status  On-going      PT LONG TERM GOAL #3   Title  R lateral pinch strength within 3# of L hand    Status  On-going      PT LONG TERM GOAL #4   Title  Pt will report ability to use R hand functionally with daily tasks such as lifting light to medium weight objects or starting car    Status  On-going            Plan - 08/17/18 0848    Clinical Impression Statement  Added more wrist and forearm strengthening today with good tolerance and continued focus on grip and pinch strengthening esp with lateral key pinch. Pt will be out of town next week, therefire will plan to reassess and recert as indicated upon her return.     Rehab Potential  Good    PT Treatment/Interventions  Patient/family education;ADLs/Self Care Home Management;Therapeutic exercise;Therapeutic activities;Electrical Stimulation;Ultrasound;Iontophoresis 4mg /ml Dexamethasone;Moist Heat;Cryotherapy;Manual techniques;Passive range of motion;Dry needling;Taping    PT Next Visit Plan  Reassess with possible recert    Consulted and Agree with Plan of Care  Patient       Patient will benefit from skilled therapeutic intervention in order to improve the following deficits and impairments:  Decreased strength, Impaired UE functional use, Increased edema, Increased muscle spasms  Visit Diagnosis: Muscle weakness (generalized)  Localized edema     Problem List Patient Active Problem List   Diagnosis Date Noted  . Anxiety about health 05/17/2018  . OSA (obstructive sleep apnea) 12/27/2017  . Hx of adenomatous colonic polyps  01/17/2017  . Postoperative nausea and vomiting 09/02/2015  . Primary localized osteoarthritis of left knee   . IBS (irritable bowel syndrome)   . Physical exam 05/30/2015  . Hypothyroidism 11/22/2014  . Vitamin D deficiency 09/21/2009  . Hyperlipidemia 09/21/2009  . SKIN CANCER, HX OF 09/15/2009  . Anxiety state 12/19/2007  . GERD 12/19/2007  .  ALLERGY 12/19/2007    Percival Spanish, PT, MPT 08/17/2018, 12:41 PM  [facility] [address]  [place], [place], [address] Phone: [phone]   Fax:  [phone]  Name: JULETTA BERHE MRN: [id number] Date of Birth: 09/11/1951

## 2018-08-23 ENCOUNTER — Encounter: Payer: Self-pay | Admitting: Family Medicine

## 2018-08-28 ENCOUNTER — Ambulatory Visit: Payer: Medicare HMO | Admitting: Podiatry

## 2018-08-28 ENCOUNTER — Ambulatory Visit: Payer: Medicare HMO | Admitting: Physical Therapy

## 2018-08-28 DIAGNOSIS — M6281 Muscle weakness (generalized): Secondary | ICD-10-CM

## 2018-08-28 DIAGNOSIS — R600 Localized edema: Secondary | ICD-10-CM | POA: Diagnosis not present

## 2018-08-28 NOTE — Therapy (Addendum)
[facility] [address]  [place], [place], [address] Phone: [phone]   Fax:  [phone]  Physical Therapy Treatment  Patient Details  Name: [name] [name] MRN: [id number] Date of Birth: [birth year] Referring Provider (PT): Eustace Moore, MD   Progress Note  Reporting Period 07/13/18 to 08/28/18  See note below for Objective Data and Assessment of Progress/Goals.      Encounter Date: 08/28/2018  PT End of Session - 08/28/18 1314    Visit Number  7    Number of Visits  16    Date for PT Re-Evaluation  09/29/18    Authorization Type  [employer]    PT Start Time  1314    PT Stop Time  1402    PT Time Calculation (min)  48 min    Activity Tolerance  Patient tolerated treatment well    Behavior During Therapy  WFL for tasks assessed/performed       Past Medical History:  Diagnosis Date  . Allergy, unspecified not elsewhere classified   . Borderline hypertension   . Bronchitis   . DJD (degenerative joint disease)   . GERD (gastroesophageal reflux disease)   . History of skin cancer   . Hypercholesterolemia   . Hypothyroidism   . IBS (irritable bowel syndrome)   . OSA (obstructive sleep apnea) 12/27/2017  . Plantar fasciitis   . PONV (postoperative nausea and vomiting)   . Postoperative nausea and vomiting 09/02/2015  . Primary localized osteoarthritis of left knee   . Skin cancer (melanoma) (HCC)    basil cell   . Vitamin D deficiency     Past Surgical History:  Procedure Laterality Date  . BUNIONECTOMY  2004   left  . COLONOSCOPY    . FOOT OSTEOTOMY  2003   right  . HERNIA REPAIR  1957  . KNEE ARTHROSCOPY  1987&2003   left  . MELANOMA EXCISION Right    shoulder  . SHOULDER ARTHROSCOPY WITH OPEN ROTATOR CUFF REPAIR  2013   right  . SKIN SURGERY  4/05   Melanoma surg left shoulder  . TOTAL KNEE ARTHROPLASTY Left 09/01/2015   Procedure: TOTAL KNEE ARTHROPLASTY;  Surgeon: Elsie Saas, MD;  Location: [facility];  Service: Orthopedics;  Laterality: Left;  . TUBAL LIGATION  1990    There were no vitals filed for this visit.  Subjective Assessment - 08/28/18 1316    Subjective  Pt reports "things are doing pretty well." Feels liek she has gotten stronger but wonders is she is plateauing. Reports she was able to make her usual holiday sausage balls over the weekend which requires ~1 hr of squeezing ingredients together per batch. Also has been doing a lot of writing with writing Christmas cards.    Patient Stated Goals  "total usage of fingers and hand back"    Currently in Pain?  No/denies         [facility] PT Assessment - 08/28/18 1314      Assessment   Medical Diagnosis  R hand weakness    Referring Provider (PT)  Eustace Moore, MD    Onset Date/Surgical Date  04/03/18    Hand Dominance  Right    Next MD Visit  none scheduled      Prior Function   Level of Independence  Independent    Vocation  Retired    Leisure  traveling, aims for 5000 steps/day      Strength  Right Elbow Flexion  5/5    Right Elbow Extension  4+/5    Right Forearm Pronation  5/5    Right Forearm Supination  5/5    Right Wrist Flexion  4/5    Right Wrist Extension  4+/5    Right Wrist Radial Deviation  4+/5    Right Wrist Ulnar Deviation  4+/5    Right Hand Grip (lbs)  40.3   42, 41, 38   Right Hand Lateral Pinch  5 lbs   4, 6, 5   Right Hand 3 Point Pinch  8 lbs   10, 7, 7                  [facility] Adult PT Treatment/Exercise - 08/28/18 1314      Exercises   Exercises  Wrist;Hand      Shoulder Exercises: ROM/Strengthening   UBE (Upper Arm Bike)  L2.5 x 6 min (3' fwd/3'back)      Hand Exercises   Digit Composite ABduction  Right;15 reps;Seated    Digit Composite ABduction Limitations  r:grip    Digit Composite ADduction  Right;15 reps;Seated    Digit Composite Adduction Limitations  r:grip    Stereognosis  Locating 10 varying objects (coins, marbles, key,  paperclips, nuts/nolts) in bowl of beans    Other Hand Exercises  Bolt box - 5 of varying size per side with R hand internal to box      Wrist Exercises   Forearm Supination  Right;10 reps;Seated   3 sets: #1 - base of yellow shaft, #2 - mid grip, #3 - end   Forearm Pronation  Right;10 reps;Seated   3 sets: #1 - base of yellow shaft, #2 - mid grip, #3 - end              PT Short Term Goals - 07/26/18 0807      PT SHORT TERM GOAL #1   Title  Independent with initial HEP    Status  Achieved        PT Long Term Goals - 08/28/18 1321      PT LONG TERM GOAL #1   Title  Independent with ongoing HEP    Status  Partially Met    Target Date  09/29/18      PT LONG TERM GOAL #2   Title  R gross grip strength within 5# of L hand    Status  On-going    Target Date  09/29/18      PT LONG TERM GOAL #3   Title  R lateral pinch strength within 3# of L hand    Status  On-going    Target Date  09/29/18      PT LONG TERM GOAL #4   Title  Pt will report ability to use R hand functionally with daily tasks such as lifting light to medium weight objects or starting car    Status  Partially Met    Target Date  09/29/18            Plan - 08/28/18 1343    Clinical Impression Statement  [name] [name] has demonstrated good progress thus far with significant gains in distal R UE and grip strength, with remaining deficits more notable with 3 point and lateral key grip strength. She notes improving functional use of her R hand with daily task such as cooking or turning the key in the ignition but still notes limitations with activities such as isolating the desired key on  her key ring. Given progress with therapy and remaining deficits, will plan to recert for 1 additional month at 2x/wk.    Rehab Potential  Good    PT Treatment/Interventions  Patient/family education;ADLs/Self Care Home Management;Therapeutic exercise;Therapeutic activities;Electrical Stimulation;Ultrasound;Iontophoresis 19m/ml  Dexamethasone;Moist Heat;Cryotherapy;Manual techniques;Passive range of motion;Dry needling;Taping    PT Next Visit Plan  Wrist & instrinsic hand muscle strengthening, especially pinch grip activities    PT Home Exercise Plan  putty exercises; rubberband exercises    Consulted and Agree with Plan of Care  Patient       Patient will benefit from skilled therapeutic intervention in order to improve the following deficits and impairments:  Decreased strength, Impaired UE functional use, Increased edema, Increased muscle spasms  Visit Diagnosis: Muscle weakness (generalized)  Localized edema     Problem List Patient Active Problem List   Diagnosis Date Noted  . Anxiety about health 05/17/2018  . OSA (obstructive sleep apnea) 12/27/2017  . Hx of adenomatous colonic polyps 01/17/2017  . Postoperative nausea and vomiting 09/02/2015  . Primary localized osteoarthritis of left knee   . IBS (irritable bowel syndrome)   . Physical exam 05/30/2015  . Hypothyroidism 11/22/2014  . Vitamin D deficiency 09/21/2009  . Hyperlipidemia 09/21/2009  . SKIN CANCER, HX OF 09/15/2009  . Anxiety state 12/19/2007  . GERD 12/19/2007  . ALLERGY 12/19/2007    JPercival Spanish11/25/2019, 3:07 PM  [facility] [place] [address]Phone: [phone]  Fax:  [phone] Name: VZEYNA MKRTCHYANMRN: [id number] of Birth: [birth year]

## 2018-08-30 ENCOUNTER — Ambulatory Visit: Payer: Medicare HMO | Admitting: Physical Therapy

## 2018-08-30 DIAGNOSIS — R600 Localized edema: Secondary | ICD-10-CM | POA: Diagnosis not present

## 2018-08-30 DIAGNOSIS — M6281 Muscle weakness (generalized): Secondary | ICD-10-CM | POA: Diagnosis not present

## 2018-08-30 NOTE — Therapy (Signed)
[facility] [address]  [place], [place], [address] Phone: [phone]   Fax:  [phone]  Physical Therapy Treatment  Patient Details  Name: [name] [name] MRN: [id number] Date of Birth: June 30, 1951 Referring Provider (PT): Eustace Moore, MD   Encounter Date: 08/30/2018  PT End of Session - 08/30/18 0843    Visit Number  8    Number of Visits  16    Date for PT Re-Evaluation  09/29/18    Authorization Type  [employer]    PT Start Time  0843    PT Stop Time  0927    PT Time Calculation (min)  44 min    Activity Tolerance  Patient tolerated treatment well    Behavior During Therapy  [facility] for tasks assessed/performed       Past Medical History:  Diagnosis Date  . Allergy, unspecified not elsewhere classified   . Borderline hypertension   . Bronchitis   . DJD (degenerative joint disease)   . GERD (gastroesophageal reflux disease)   . History of skin cancer   . Hypercholesterolemia   . Hypothyroidism   . IBS (irritable bowel syndrome)   . OSA (obstructive sleep apnea) 12/27/2017  . Plantar fasciitis   . PONV (postoperative nausea and vomiting)   . Postoperative nausea and vomiting 09/02/2015  . Primary localized osteoarthritis of left knee   . Skin cancer (melanoma) (HCC)    basil cell   . Vitamin D deficiency     Past Surgical History:  Procedure Laterality Date  . BUNIONECTOMY  2004   left  . COLONOSCOPY    . FOOT OSTEOTOMY  2003   right  . HERNIA REPAIR  1957  . KNEE ARTHROSCOPY  1987&2003   left  . MELANOMA EXCISION Right    shoulder  . SHOULDER ARTHROSCOPY WITH OPEN ROTATOR CUFF REPAIR  2013   right  . SKIN SURGERY  4/05   Melanoma surg left shoulder  . TOTAL KNEE ARTHROPLASTY Left 09/01/2015   Procedure: TOTAL KNEE ARTHROPLASTY;  Surgeon: Elsie Saas, MD;  Location: [facility];  Service: Orthopedics;  Laterality: Left;  . TUBAL LIGATION  1990    There were no vitals filed for  this visit.  Subjective Assessment - 08/30/18 0845    Subjective  No new concerns today.    Patient Stated Goals  "total usage of fingers and hand back"    Currently in Pain?  No/denies                       [facility] Adult PT Treatment/Exercise - 08/30/18 0843      Exercises   Exercises  Wrist;Hand      Shoulder Exercises: ROM/Strengthening   UBE (Upper Arm Bike)  L2.5 x 6 min (3' fwd/3'back)      Hand Exercises   Thumb Opposition  Linked "ring" pull apart with each finger 10 x 3-5"     Digiticizer  R hand - Green (5.0#) isolating each finger then full hand squeeze x10    Other Hand Exercises  ABC's written in yellow putty; Lateral key pinch & pull, 3 point pinch & pull x 20 each with green putty     Other Hand Exercises  Lateral key grip (max pinch 7#) & 3 point pinch (max pinch 10#) grip with pinch dynamometer x15      Wrist Exercises   Other wrist exercises  B wrist rotation with tethered  red TB on PVC x 30-35 revolutions in each direction - 3 sets each palms down & up               PT Short Term Goals - 07/26/18 0807      PT SHORT TERM GOAL #1   Title  Independent with initial HEP    Status  Achieved        PT Long Term Goals - 08/28/18 1321      PT LONG TERM GOAL #1   Title  Independent with ongoing HEP    Status  Partially Met    Target Date  09/29/18      PT LONG TERM GOAL #2   Title  R gross grip strength within 5# of L hand    Status  On-going    Target Date  09/29/18      PT LONG TERM GOAL #3   Title  R lateral pinch strength within 3# of L hand    Status  On-going    Target Date  09/29/18      PT LONG TERM GOAL #4   Title  Pt will report ability to use R hand functionally with daily tasks such as lifting light to medium weight objects or starting car    Status  Partially Met    Target Date  09/29/18            Plan - 08/30/18 0846    Clinical Impression Statement  Treatment focusing on lateral key & 3 point pinch grips  in functional uses such as holding/writing with pen in putty for added resistance as well as continued finger opposition and wrist strengthening with good tolerance other than expected fatigue.    Rehab Potential  Good    PT Treatment/Interventions  Patient/family education;ADLs/Self Care Home Management;Therapeutic exercise;Therapeutic activities;Electrical Stimulation;Ultrasound;Iontophoresis 5m/ml Dexamethasone;Moist Heat;Cryotherapy;Manual techniques;Passive range of motion;Dry needling;Taping    PT Next Visit Plan  Wrist & instrinsic hand muscle strengthening, especially pinch grip activities    PT Home Exercise Plan  putty exercises; rubberband exercises    Consulted and Agree with Plan of Care  Patient       Patient will benefit from skilled therapeutic intervention in order to improve the following deficits and impairments:  Decreased strength, Impaired UE functional use, Increased edema, Increased muscle spasms  Visit Diagnosis: Muscle weakness (generalized)  Localized edema     Problem List Patient Active Problem List   Diagnosis Date Noted  . Anxiety about health 05/17/2018  . OSA (obstructive sleep apnea) 12/27/2017  . Hx of adenomatous colonic polyps 01/17/2017  . Postoperative nausea and vomiting 09/02/2015  . Primary localized osteoarthritis of left knee   . IBS (irritable bowel syndrome)   . Physical exam 05/30/2015  . Hypothyroidism 11/22/2014  . Vitamin D deficiency 09/21/2009  . Hyperlipidemia 09/21/2009  . SKIN CANCER, HX OF 09/15/2009  . Anxiety state 12/19/2007  . GERD 12/19/2007  . ALLERGY 12/19/2007    JPercival Spanish PT, MPT 08/30/2018, 9:33 AM  [facility] [place] [address]Phone: [phone]  Fax:  [phone] Name: [name] BUSTERMRN: [id number] of Birth: [birth year]

## 2018-09-04 ENCOUNTER — Telehealth: Payer: Self-pay | Admitting: *Deleted

## 2018-09-04 ENCOUNTER — Ambulatory Visit: Payer: Medicare HMO | Admitting: Podiatry

## 2018-09-04 ENCOUNTER — Encounter: Payer: Self-pay | Admitting: Podiatry

## 2018-09-04 DIAGNOSIS — L600 Ingrowing nail: Secondary | ICD-10-CM | POA: Diagnosis not present

## 2018-09-04 MED ORDER — NEOMYCIN-POLYMYXIN-HC 3.5-10000-1 OT SOLN: OTIC | 0 refills | Status: DC

## 2018-09-04 NOTE — Telephone Encounter (Signed)
John, pharmacist - [facility] asked if the cortisporin drops were for the toe and I told him yes. John asked if they could give pt the suspension and if she did not want to pay the high co-pay what would be the alternative medication. I told John the suspension would be fine and if pt could not afford could use neosporin ointment.

## 2018-09-04 NOTE — Patient Instructions (Signed)

## 2018-09-05 ENCOUNTER — Ambulatory Visit: Payer: Medicare HMO | Attending: Neurological Surgery

## 2018-09-05 DIAGNOSIS — R600 Localized edema: Secondary | ICD-10-CM | POA: Insufficient documentation

## 2018-09-05 DIAGNOSIS — M6281 Muscle weakness (generalized): Secondary | ICD-10-CM | POA: Diagnosis not present

## 2018-09-05 NOTE — Therapy (Signed)
[facility] [address]  [place], [place], [address] Phone: [phone]   Fax:  [phone]  Physical Therapy Treatment  Patient Details  Name: [name] [name] MRN: [id number] Date of Birth: 02/10/1951 Referring Provider (PT): Eustace Moore, MD   Encounter Date: 09/05/2018  PT End of Session - 09/05/18 0915    Visit Number  9    Number of Visits  16    Date for PT Re-Evaluation  09/29/18    Authorization Type  [employer]    PT Start Time  0843    PT Stop Time  0927    PT Time Calculation (min)  44 min    Activity Tolerance  Patient tolerated treatment well    Behavior During Therapy  [facility] for tasks assessed/performed       Past Medical History:  Diagnosis Date  . Allergy, unspecified not elsewhere classified   . Borderline hypertension   . Bronchitis   . DJD (degenerative joint disease)   . GERD (gastroesophageal reflux disease)   . History of skin cancer   . Hypercholesterolemia   . Hypothyroidism   . IBS (irritable bowel syndrome)   . OSA (obstructive sleep apnea) 12/27/2017  . Plantar fasciitis   . PONV (postoperative nausea and vomiting)   . Postoperative nausea and vomiting 09/02/2015  . Primary localized osteoarthritis of left knee   . Skin cancer (melanoma) (HCC)    basil cell   . Vitamin D deficiency     Past Surgical History:  Procedure Laterality Date  . BUNIONECTOMY  2004   left  . COLONOSCOPY    . FOOT OSTEOTOMY  2003   right  . HERNIA REPAIR  1957  . KNEE ARTHROSCOPY  1987&2003   left  . MELANOMA EXCISION Right    shoulder  . SHOULDER ARTHROSCOPY WITH OPEN ROTATOR CUFF REPAIR  2013   right  . SKIN SURGERY  4/05   Melanoma surg left shoulder  . TOTAL KNEE ARTHROPLASTY Left 09/01/2015   Procedure: TOTAL KNEE ARTHROPLASTY;  Surgeon: Elsie Saas, MD;  Location: [facility];  Service: Orthopedics;  Laterality: Left;  . TUBAL LIGATION  1990    There were no vitals filed for  this visit.  Subjective Assessment - 09/05/18 0845    Subjective  Pt. reporting she feels R grip strength has improved.      Patient Stated Goals  "total usage of fingers and hand back"    Currently in Pain?  No/denies    Pain Score  0-No pain    Multiple Pain Sites  No                       [facility] Adult PT Treatment/Exercise - 09/05/18 0848      Elbow Exercises   Forearm Supination  Right;Seated;15 reps   using hammer with hand on end of handle; good control   Forearm Pronation  Right;Seated;15 reps   using hammer with hand on end of handle; good control     Shoulder Exercises: ROM/Strengthening   UBE (Upper Arm Bike)  L2.5 x 6 min (3' fwd/3'back)      Hand Exercises   Digiticizer  R hand - Green (5.0#) isolating each finger then full hand squeeze x 15 reps     Other Hand Exercises  Bolt Box - 7 of varying sizes per side    with bolts slightly tightened   Other Hand Exercises  3-point  pinch using small black paper clips on paper at chest height to simulate putting clothes on clothes hangers x 10 clips on x 10 clips off       Wrist Exercises   Wrist Flexion  15 reps;Right;Seated;Bar weights/barbell;Strengthening    Bar Weights/Barbell (Wrist Flexion)  2 lbs    Wrist Extension  15 reps;Right;Strengthening;Seated;Bar weights/barbell    Bar Weights/Barbell (Wrist Extension)  2 lbs               PT Short Term Goals - 07/26/18 0807      PT SHORT TERM GOAL #1   Title  Independent with initial HEP    Status  Achieved        PT Long Term Goals - 08/28/18 1321      PT LONG TERM GOAL #1   Title  Independent with ongoing HEP    Status  Partially Met    Target Date  09/29/18      PT LONG TERM GOAL #2   Title  R gross grip strength within 5# of L hand    Status  On-going    Target Date  09/29/18      PT LONG TERM GOAL #3   Title  R lateral pinch strength within 3# of L hand    Status  On-going    Target Date  09/29/18      PT LONG TERM GOAL #4    Title  Pt will report ability to use R hand functionally with daily tasks such as lifting light to medium weight objects or starting car    Status  Partially Met    Target Date  09/29/18            Plan - 09/05/18 1219    Clinical Impression Statement  Yeraldin reporting she felt fine after last visit.  No new complaints today.  Reports ongoing difficulty turning keys and with clipping clothing items on clothes hangers in closet at home.  Session focused on pinch grip activities and wrist strengthening with pt. tolerating all well without pain.  Still with notable strength deficits managing bolt screwing/unscrewing activity today.  Justice does feel that her grip strength has improved since starting therapy.  Progressing well toward goals.      PT Treatment/Interventions  Patient/family education;ADLs/Self Care Home Management;Therapeutic exercise;Therapeutic activities;Electrical Stimulation;Ultrasound;Iontophoresis 52m/ml Dexamethasone;Moist Heat;Cryotherapy;Manual techniques;Passive range of motion;Dry needling;Taping    PT Next Visit Plan  Possible further work on clipping activities for simulation of hanging clothes; Wrist & instrinsic hand muscle strengthening, especially pinch grip activities    PT Home Exercise Plan  putty exercises; rubberband exercises    Consulted and Agree with Plan of Care  Patient       Patient will benefit from skilled therapeutic intervention in order to improve the following deficits and impairments:  Decreased strength, Impaired UE functional use, Increased edema, Increased muscle spasms  Visit Diagnosis: Muscle weakness (generalized)  Localized edema     Problem List Patient Active Problem List   Diagnosis Date Noted  . Anxiety about health 05/17/2018  . OSA (obstructive sleep apnea) 12/27/2017  . Hx of adenomatous colonic polyps 01/17/2017  . Postoperative nausea and vomiting 09/02/2015  . Primary localized osteoarthritis of left knee   . IBS  (irritable bowel syndrome)   . Physical exam 05/30/2015  . Hypothyroidism 11/22/2014  . Vitamin D deficiency 09/21/2009  . Hyperlipidemia 09/21/2009  . SKIN CANCER, HX OF 09/15/2009  . Anxiety state 12/19/2007  . GERD 12/19/2007  .  ALLERGY 12/19/2007    Bess Harvest, PTA 09/05/18 12:28 PM    [facility] [address]  [place], [place], [address] Phone: [phone]   Fax:  [phone]  Name: DEJANEE THIBEAUX MRN: [id number] Date of Birth: [birth year]

## 2018-09-05 NOTE — Progress Notes (Signed)
Subjective:   Patient ID: [name] [name], female   DOB: 67 y.o.   MRN: [id number]   HPI Patient states that the third nail on her left foot has been sore and making it hard to walk or wear shoe gear comfortably   ROS      Objective:  Physical Exam  Neurovascular status intact with thickened deformed dystrophic third nail left is painful     Assessment:  Chronic nail disease third left with pain     Plan:  Discussed correction and allow patient to read consent form for procedure explaining risk.  Today I infiltrated the third toe 60 mg like Marcaine mixture sterile prep applied to the digit and using sterile instrumentation I removed the nail exposed matrix and applied phenol 3 applications 30 seconds followed by alcohol lavage sterile dressing.  Instructed on leaving this on 24 hours but to take it off earlier if any throbbing were to occur and I placed on Corticosporin otic solution and encouraged to call with any questions concerns

## 2018-09-07 ENCOUNTER — Telehealth: Payer: Self-pay | Admitting: Podiatry

## 2018-09-07 ENCOUNTER — Encounter: Payer: Medicare HMO | Admitting: Podiatry

## 2018-09-07 DIAGNOSIS — L600 Ingrowing nail: Secondary | ICD-10-CM

## 2018-09-07 NOTE — Telephone Encounter (Signed)
I asked pt if she could come in today to see the clinical nurse at 2:30pm and she accepted. A. Horton placed pt on the schedule.

## 2018-09-07 NOTE — Telephone Encounter (Signed)
Pt had toenail removed on 12/2 and has been doing soaks as instructed. Pt now has a blister on the bottom of her toe and would like for the nurse to give her a call and advise.

## 2018-09-07 NOTE — Patient Instructions (Signed)

## 2018-09-08 ENCOUNTER — Ambulatory Visit: Payer: Medicare HMO | Admitting: Physical Therapy

## 2018-09-08 ENCOUNTER — Encounter: Payer: Self-pay | Admitting: Physical Therapy

## 2018-09-08 DIAGNOSIS — R600 Localized edema: Secondary | ICD-10-CM

## 2018-09-08 DIAGNOSIS — M6281 Muscle weakness (generalized): Secondary | ICD-10-CM | POA: Diagnosis not present

## 2018-09-08 NOTE — Therapy (Signed)
[facility] [address]  [place], [place], [address] Phone: [phone]   Fax:  [phone]  Physical Therapy Treatment  Patient Details  Name: [name] [name] MRN: [id number] Date of Birth: 1950-11-08 Referring Provider (PT): Eustace Moore, MD   Encounter Date: 09/08/2018  PT End of Session - 09/08/18 0933    Visit Number  10    Number of Visits  16    Date for PT Re-Evaluation  09/29/18    Authorization Type  [employer]    PT Start Time  0933    PT Stop Time  1014    PT Time Calculation (min)  41 min    Activity Tolerance  Patient tolerated treatment well    Behavior During Therapy  [facility] for tasks assessed/performed       Past Medical History:  Diagnosis Date  . Allergy, unspecified not elsewhere classified   . Borderline hypertension   . Bronchitis   . DJD (degenerative joint disease)   . GERD (gastroesophageal reflux disease)   . History of skin cancer   . Hypercholesterolemia   . Hypothyroidism   . IBS (irritable bowel syndrome)   . OSA (obstructive sleep apnea) 12/27/2017  . Plantar fasciitis   . PONV (postoperative nausea and vomiting)   . Postoperative nausea and vomiting 09/02/2015  . Primary localized osteoarthritis of left knee   . Skin cancer (melanoma) (HCC)    basil cell   . Vitamin D deficiency     Past Surgical History:  Procedure Laterality Date  . BUNIONECTOMY  2004   left  . COLONOSCOPY    . FOOT OSTEOTOMY  2003   right  . HERNIA REPAIR  1957  . KNEE ARTHROSCOPY  1987&2003   left  . MELANOMA EXCISION Right    shoulder  . SHOULDER ARTHROSCOPY WITH OPEN ROTATOR CUFF REPAIR  2013   right  . SKIN SURGERY  4/05   Melanoma surg left shoulder  . TOTAL KNEE ARTHROPLASTY Left 09/01/2015   Procedure: TOTAL KNEE ARTHROPLASTY;  Surgeon: Elsie Saas, MD;  Location: [facility];  Service: Orthopedics;  Laterality: Left;  . TUBAL LIGATION  1990    There were no vitals filed for  this visit.  Subjective Assessment - 09/08/18 0935    Subjective  Pt doing well today.    Patient Stated Goals  "total usage of fingers and hand back"    Currently in Pain?  No/denies         [facility] PT Assessment - 09/08/18 0933      Assessment   Medical Diagnosis  R hand weakness    Referring Provider (PT)  Eustace Moore, MD    Onset Date/Surgical Date  04/03/18    Hand Dominance  Right    Next MD Visit  none scheduled      Strength   Right Hand Grip (lbs)  40.3   42, 41, 38   Right Hand Lateral Pinch  6 lbs   5, 7, 6   Right Hand 3 Point Pinch  12 lbs   12, 12, 12                  [facility] Adult PT Treatment/Exercise - 09/08/18 0933      Exercises   Exercises  Wrist;Hand      Shoulder Exercises: ROM/Strengthening   UBE (Upper Arm Bike)  L3.0 x 6 min (3' fwd/3'back)      Hand Exercises  Digiticizer  R hand key grip thumb to each finger - Green (5.0#) x10 each    Other Hand Exercises  Bolt Box - 7 bolts/wingnuts of varying sizes per side     Other Hand Exercises  Clothespins - lateral pinch to horizontal polefrom bar to upright pole - 1 set each with red, green then blue, black clothespins      Wrist Exercises   Forearm Supination  Right;15 reps;Theraband;Seated    Theraband Level (Supination)  Level 1 (Yellow)    Forearm Supination Limitations  key grip hold on yellow TB    Forearm Pronation  Right;15 reps;Theraband;Seated    Theraband Level (Pronation)  Level 1 (Yellow)    Forearm Pronation Limitations  key grip hold on yellow TB               PT Short Term Goals - 07/26/18 0807      PT SHORT TERM GOAL #1   Title  Independent with initial HEP    Status  Achieved        PT Long Term Goals - 08/28/18 1321      PT LONG TERM GOAL #1   Title  Independent with ongoing HEP    Status  Partially Met    Target Date  09/29/18      PT LONG TERM GOAL #2   Title  R gross grip strength within 5# of L hand    Status  On-going    Target Date   09/29/18      PT LONG TERM GOAL #3   Title  R lateral pinch strength within 3# of L hand    Status  On-going    Target Date  09/29/18      PT LONG TERM GOAL #4   Title  Pt will report ability to use R hand functionally with daily tasks such as lifting light to medium weight objects or starting car    Status  Partially Met    Target Date  09/29/18            Plan - 09/08/18 0936    Clinical Impression Statement  R hand grip strength unchanged from last measurement, however 3 point pinch grip signifantly improved and lateral pinch/key grip strength slightly improved. Strengthening activities today continue to focus on the latter 2 grips with functional activity simulation such as key grip with forearm pronation/supination to turn key in lock or ignition as well as clothespin activites to simulate hanging clothes. Moderate cueing necessary to avoid substitution but otherwise good tolerance.    Rehab Potential  Good    PT Treatment/Interventions  Patient/family education;ADLs/Self Care Home Management;Therapeutic exercise;Therapeutic activities;Electrical Stimulation;Ultrasound;Iontophoresis 77m/ml Dexamethasone;Moist Heat;Cryotherapy;Manual techniques;Passive range of motion;Dry needling;Taping    PT Next Visit Plan  Wrist & instrinsic hand muscle strengthening, especially pinch grip activities, with functional activity carryover/simulation    PT Home Exercise Plan  putty exercises; rubberband exercises    Consulted and Agree with Plan of Care  Patient       Patient will benefit from skilled therapeutic intervention in order to improve the following deficits and impairments:  Decreased strength, Impaired UE functional use, Increased edema, Increased muscle spasms  Visit Diagnosis: Muscle weakness (generalized)  Localized edema     Problem List Patient Active Problem List   Diagnosis Date Noted  . Anxiety about health 05/17/2018  . OSA (obstructive sleep apnea) 12/27/2017  .  Hx of adenomatous colonic polyps 01/17/2017  . Postoperative nausea and vomiting 09/02/2015  .  Primary localized osteoarthritis of left knee   . IBS (irritable bowel syndrome)   . Physical exam 05/30/2015  . Hypothyroidism 11/22/2014  . Vitamin D deficiency 09/21/2009  . Hyperlipidemia 09/21/2009  . SKIN CANCER, HX OF 09/15/2009  . Anxiety state 12/19/2007  . GERD 12/19/2007  . ALLERGY 12/19/2007    Percival Spanish, PT, MPT 09/08/2018, 10:41 AM  [facility] [address]  [place], [place], [address] Phone: [phone]   Fax:  [phone]  Name: [name] [name] MRN: [id number] Date of Birth: Apr 07, 1951

## 2018-09-11 ENCOUNTER — Ambulatory Visit (INDEPENDENT_AMBULATORY_CARE_PROVIDER_SITE_OTHER): Payer: Medicare HMO | Admitting: Family Medicine

## 2018-09-11 ENCOUNTER — Encounter: Payer: Self-pay | Admitting: Family Medicine

## 2018-09-11 ENCOUNTER — Encounter: Payer: Self-pay | Admitting: Physical Therapy

## 2018-09-11 ENCOUNTER — Other Ambulatory Visit: Payer: Self-pay

## 2018-09-11 ENCOUNTER — Ambulatory Visit: Payer: Medicare HMO | Admitting: Physical Therapy

## 2018-09-11 VITALS — BP 123/82 | HR 75 | Temp 98.0°F | Resp 17 | Ht 66.0 in | Wt 204.2 lb

## 2018-09-11 DIAGNOSIS — M6281 Muscle weakness (generalized): Secondary | ICD-10-CM

## 2018-09-11 DIAGNOSIS — R002 Palpitations: Secondary | ICD-10-CM | POA: Diagnosis not present

## 2018-09-11 DIAGNOSIS — R600 Localized edema: Secondary | ICD-10-CM | POA: Diagnosis not present

## 2018-09-11 LAB — CBC WITH DIFFERENTIAL/PLATELET
Basophils Absolute: 0 10*3/uL (ref 0.0–0.1)
Basophils Relative: 0.6 % (ref 0.0–3.0)
Eosinophils Absolute: 0.1 10*3/uL (ref 0.0–0.7)
Eosinophils Relative: 1.9 % (ref 0.0–5.0)
HCT: 38.5 % (ref 36.0–46.0)
Hemoglobin: 12.6 g/dL (ref 12.0–15.0)
LYMPHS ABS: 2.1 10*3/uL (ref 0.7–4.0)
Lymphocytes Relative: 34.1 % (ref 12.0–46.0)
MCHC: 32.7 g/dL (ref 30.0–36.0)
MCV: 75.5 fl — AB (ref 78.0–100.0)
MONOS PCT: 6.7 % (ref 3.0–12.0)
Monocytes Absolute: 0.4 10*3/uL (ref 0.1–1.0)
NEUTROS ABS: 3.5 10*3/uL (ref 1.4–7.7)
Neutrophils Relative %: 56.7 % (ref 43.0–77.0)
Platelets: 307 10*3/uL (ref 150.0–400.0)
RBC: 5.1 Mil/uL (ref 3.87–5.11)
RDW: 17.2 % — ABNORMAL HIGH (ref 11.5–15.5)
WBC: 6.2 10*3/uL (ref 4.0–10.5)

## 2018-09-11 LAB — TSH: TSH: 1.14 u[IU]/mL (ref 0.35–4.50)

## 2018-09-11 LAB — BASIC METABOLIC PANEL
BUN: 16 mg/dL (ref 6–23)
CO2: 26 mEq/L (ref 19–32)
Calcium: 9.9 mg/dL (ref 8.4–10.5)
Chloride: 106 mEq/L (ref 96–112)
Creatinine, Ser: 0.72 mg/dL (ref 0.40–1.20)
GFR: 85.93 mL/min (ref >60.00)
Glucose, Bld: 93 mg/dL (ref 70–99)
Potassium: 4.2 mEq/L (ref 3.5–5.1)
Sodium: 141 mEq/L (ref 135–145)

## 2018-09-11 NOTE — Patient Instructions (Signed)
Follow up as needed We'll notify you of your lab results and make any changes if needed We'll call you to get your Holter monitor set up Try and avoid caffeine and sugar- these can both increase your heart rate Call with any questions or concerns Happy Holidays!!!

## 2018-09-11 NOTE — Progress Notes (Signed)
   Subjective:    Patient ID: [name] [name], female    DOB: [birth year], 67 y.o.   MRN: [id number]  HPI Palpitations- sxs started in September.  Pt reports HR increases w/ exertion.  Has had a few episodes at rest but this is not typical for her.  Denies associated diaphoresis, nausea.  Will have flushing.  No dizziness. No CP but some SOB.  Pt reports HR will elevate w/ minimal exertion.  Pt monitors using fitbit- when walking from parking lot to [employer] HR got to 149.  Pt reports this is a regular occurrence- daily.   Review of Systems For ROS see HPI      Objective:   Physical Exam  Constitutional: She is oriented to person, place, and time. She appears well-developed and well-nourished. No distress.  HENT:  Head: Normocephalic and atraumatic.  Eyes: Pupils are equal, round, and reactive to light. Conjunctivae and EOM are normal.  Neck: Normal range of motion. Neck supple. No thyromegaly present.  Cardiovascular: Normal rate, regular rhythm, normal heart sounds and intact distal pulses.  No murmur heard. Pulmonary/Chest: Effort normal and breath sounds normal. No respiratory distress.  Abdominal: Soft. She exhibits no distension. There is no tenderness.  Musculoskeletal: She exhibits no edema.  Lymphadenopathy:    She has no cervical adenopathy.  Neurological: She is alert and oriented to person, place, and time.  Skin: Skin is warm and dry.  Psychiatric: She has a normal mood and affect. Her behavior is normal.  Vitals reviewed.         Assessment & Plan:  Palpitations- new.  Pt reports HR will elevate w/ minimal exertion.  EKG WNL.  Check labs to r/o metabolic cause.  Get holter to assess over 48 hr period.  Depending on results or if sxs continue, will need Cards referral.  Pt expressed understanding and is in agreement w/ plan.

## 2018-09-11 NOTE — Therapy (Signed)
[facility] [address]  [place], [place], [address] Phone: [phone]   Fax:  [phone]  Physical Therapy Treatment  Patient Details  Name: [name] [name] MRN: [id number] Date of Birth: 1951-04-08 Referring Provider (PT): [name] Moore, MD   Encounter Date: 09/11/2018  PT End of Session - 09/11/18 0932    Visit Number  11    Number of Visits  16    Date for PT Re-Evaluation  09/29/18    Authorization Type  [employer]    PT Start Time  0932    PT Stop Time  1287    PT Time Calculation (min)  42 min    Activity Tolerance  Patient tolerated treatment well    Behavior During Therapy  [facility] for tasks assessed/performed       Past Medical History:  Diagnosis Date  . Allergy, unspecified not elsewhere classified   . Borderline hypertension   . Bronchitis   . DJD (degenerative joint disease)   . GERD (gastroesophageal reflux disease)   . History of skin cancer   . Hypercholesterolemia   . Hypothyroidism   . IBS (irritable bowel syndrome)   . OSA (obstructive sleep apnea) 12/27/2017  . Plantar fasciitis   . PONV (postoperative nausea and vomiting)   . Postoperative nausea and vomiting 09/02/2015  . Primary localized osteoarthritis of left knee   . Skin cancer (melanoma) (HCC)    basil cell   . Vitamin D deficiency     Past Surgical History:  Procedure Laterality Date  . BUNIONECTOMY  2004   left  . COLONOSCOPY    . FOOT OSTEOTOMY  2003   right  . HERNIA REPAIR  1957  . KNEE ARTHROSCOPY  1987&2003   left  . MELANOMA EXCISION Right    shoulder  . SHOULDER ARTHROSCOPY WITH OPEN ROTATOR CUFF REPAIR  2013   right  . SKIN SURGERY  4/05   Melanoma surg left shoulder  . TOTAL KNEE ARTHROPLASTY Left 09/01/2015   Procedure: TOTAL KNEE ARTHROPLASTY;  Surgeon: Elsie Saas, MD;  Location: [facility];  Service: Orthopedics;  Laterality: Left;  . TUBAL LIGATION  1990    There were no vitals filed for  this visit.  Subjective Assessment - 09/11/18 0934    Subjective  Pt reporting she has not attempted any of her HEP since last session because she was busy helping out at her son's house while he is layed up from his back.    Patient Stated Goals  "total usage of fingers and hand back"    Currently in Pain?  No/denies                       [facility] Adult PT Treatment/Exercise - 09/11/18 0932      Exercises   Exercises  Wrist;Hand      Elbow Exercises   Forearm Supination  Right;15 reps;Theraband;Seated    Theraband Level (Supination)  Level 1 (Yellow)    Forearm Supination Limitations  key grip hold on yellow TB with one end of band wrapped back over thumb    Forearm Pronation  Right;15 reps;Theraband;Seated    Theraband Level (Pronation)  Level 1 (Yellow)    Forearm Pronation Limitations  key grip hold on yellow TB      Shoulder Exercises: ROM/Strengthening   UBE (Upper Arm Bike)  L3.0 x 6 min (3' fwd/3'back)      Hand Exercises  DIPJ Flexion Limitations  2 hand lateral grip (4th & 5th digits) with red TB pull x15    Digit Composite ABduction  Right;15 reps;Seated    Digit Composite ABduction Limitations  r:grip    Digit Composite ADduction  Right;15 reps;Seated    Digit Composite Adduction Limitations  r:grip    Digiticizer  R hand key grip thumb to each finger - Green (5.0#) x15 each    Small Pegboard  30 pegs in random holes    Other Hand Exercises  Stovall hand grip 35# 2 x 15    Other Hand Exercises  Lateral key grip (max pinch 7#) & 3 point pinch (max pinch 8#) grip with pinch dynamometer x15      Wrist Exercises   Wrist Flexion  Right;15 reps;Theraband;Strengthening;Seated    Theraband Level (Wrist Flexion)  Level 2 (Red)    Wrist Extension  Right;15 reps;Theraband;Strengthening;Seated    Theraband Level (Wrist Extension)  Level 2 (Red)    Wrist Radial Deviation  Right;15 reps;Theraband;Strengthening;Seated    Theraband Level (Radial Deviation)  Level 2  (Red)    Wrist Ulnar Deviation  Right;15 reps;Theraband;Strengthening;Seated    Theraband Level (Ulnar Deviation)  Level 2 (Red)    Other wrist exercises  B wrist rotation with tethered green TB on PVC x 30-35 revolutions in each direction - 3 sets each palms down & up               PT Short Term Goals - 07/26/18 1884      PT SHORT TERM GOAL #1   Title  Independent with initial HEP    Status  Achieved        PT Long Term Goals - 09/11/18 0936      PT LONG TERM GOAL #1   Title  Independent with ongoing HEP    Status  Partially Met      PT LONG TERM GOAL #2   Title  R gross grip strength within 5# of L hand    Status  On-going      PT LONG TERM GOAL #3   Title  R lateral pinch strength within 3# of L hand    Status  On-going      PT LONG TERM GOAL #4   Title  Pt will report ability to use R hand functionally with daily tasks such as lifting light to medium weight objects or starting car    Status  Partially Met            Plan - 09/11/18 0936    Clinical Impression Statement  [name] demonstrating no difficulty with peg placement in small pegboard other the noting more effort requrie to manipulate peg in her hand to line it up for placement. Good tolerance for theraband wrist strengthening but cues necessary for proper pacing and continued difficulty with lateral pinch grip on TB during pronation/supination to simulate key turn.    Rehab Potential  Good    PT Treatment/Interventions  Patient/family education;ADLs/Self Care Home Management;Therapeutic exercise;Therapeutic activities;Electrical Stimulation;Ultrasound;Iontophoresis 53m/ml Dexamethasone;Moist Heat;Cryotherapy;Manual techniques;Passive range of motion;Dry needling;Taping    PT Next Visit Plan  Wrist & instrinsic hand muscle strengthening, especially pinch grip activities, with functional activity carryover/simulation    PT Home Exercise Plan  putty exercises; rubberband exercises    Consulted and Agree  with Plan of Care  Patient       Patient will benefit from skilled therapeutic intervention in order to improve the following deficits and impairments:  Decreased strength,  Impaired UE functional use, Increased edema, Increased muscle spasms  Visit Diagnosis: Muscle weakness (generalized)  Localized edema     Problem List Patient Active Problem List   Diagnosis Date Noted  . Anxiety about health 05/17/2018  . OSA (obstructive sleep apnea) 12/27/2017  . Hx of adenomatous colonic polyps 01/17/2017  . Postoperative nausea and vomiting 09/02/2015  . Primary localized osteoarthritis of left knee   . IBS (irritable bowel syndrome)   . Physical exam 05/30/2015  . Hypothyroidism 11/22/2014  . Vitamin D deficiency 09/21/2009  . Hyperlipidemia 09/21/2009  . SKIN CANCER, HX OF 09/15/2009  . Anxiety state 12/19/2007  . GERD 12/19/2007  . ALLERGY 12/19/2007    Percival Spanish, PT, MPT 09/11/2018, 2:48 PM  [facility] [address]  [place], [place], [address] Phone: [phone]   Fax:  [phone]  Name: [name] [name] MRN: [id number] Date of Birth: 1951/03/26

## 2018-09-12 ENCOUNTER — Ambulatory Visit (INDEPENDENT_AMBULATORY_CARE_PROVIDER_SITE_OTHER): Payer: Self-pay | Admitting: Podiatry

## 2018-09-12 DIAGNOSIS — L600 Ingrowing nail: Secondary | ICD-10-CM

## 2018-09-12 MED ORDER — SULFAMETHOXAZOLE-TRIMETHOPRIM 800-160 MG PO TABS: 1.0000 | ORAL_TABLET | Freq: Two times a day (BID) | ORAL | 0 refills | Status: DC

## 2018-09-14 ENCOUNTER — Ambulatory Visit: Payer: Medicare HMO

## 2018-09-14 ENCOUNTER — Other Ambulatory Visit: Payer: Medicare HMO

## 2018-09-14 DIAGNOSIS — M6281 Muscle weakness (generalized): Secondary | ICD-10-CM

## 2018-09-14 DIAGNOSIS — R600 Localized edema: Secondary | ICD-10-CM | POA: Diagnosis not present

## 2018-09-14 NOTE — Progress Notes (Signed)
Subjective: [name] [name] is a 67 y.o.  female returns to office today for follow up evaluation after having left 3rd digit toenail nail avulsion performed with Dr. Paulla Dolly.  She has been soaking Epson salts however she has noticed some redness and swelling to the toe.  She has been putting antibiotic drops.  Patient denies fevers, chills, nausea, vomiting. Denies any calf pain, chest pain, SOB.   Objective:  Vitals: Reviewed  General: Well developed, nourished, in no acute distress, alert and oriented x3   Dermatology: Skin is warm, dry and supple bilateral.  Left lesser toe procedure site border appears to be clean, dry, with mild granular tissue and surrounding scab.  There is mild surrounding erythema and edema at the tip there is no ascending cellulitis.  There is no fluctuation crepitation and there is no drainage of pus.  The remaining nails appear unremarkable at this time. There are no other lesions or other signs of infection present.  Neurovascular status: Intact. No lower extremity swelling; No pain with calf compression bilateral.  Musculoskeletal: Decreased tenderness to palpation of the affected toe. Muscular strength within normal limits bilateral.   Assesement and Plan: S/p partial nail avulsion, doing well.   -Continue soaking in epsom salts twice a day followed by antibiotic ointment and a band-aid. Can leave uncovered at night. Continue this until completely healed.  -Given the erythema will start Bactrim given her history of knee replacement. -Monitor for any signs/symptoms of infection. Call the office immediately if any occur or go directly to the emergency room. Call with any questions/concerns.  Celesta Gentile, DPM

## 2018-09-14 NOTE — Therapy (Signed)
[facility] [address]  [place], [place], [address] Phone: [phone]   Fax:  [phone]  Physical Therapy Treatment  Patient Details  Name: [name] [name] MRN: [id number] Date of Birth: 1951/04/11 Referring Provider (PT): Eustace Moore, MD   Encounter Date: 09/14/2018  PT End of Session - 09/14/18 0858    Visit Number  12    Number of Visits  16    Date for PT Re-Evaluation  09/29/18    Authorization Type  [employer]    PT Start Time  0850    PT Stop Time  0932    PT Time Calculation (min)  42 min    Activity Tolerance  Patient tolerated treatment well    Behavior During Therapy  [facility] for tasks assessed/performed       Past Medical History:  Diagnosis Date  . Allergy, unspecified not elsewhere classified   . Borderline hypertension   . Bronchitis   . DJD (degenerative joint disease)   . GERD (gastroesophageal reflux disease)   . History of skin cancer   . Hypercholesterolemia   . Hypothyroidism   . IBS (irritable bowel syndrome)   . OSA (obstructive sleep apnea) 12/27/2017  . Plantar fasciitis   . PONV (postoperative nausea and vomiting)   . Postoperative nausea and vomiting 09/02/2015  . Primary localized osteoarthritis of left knee   . Skin cancer (melanoma) (HCC)    basil cell   . Vitamin D deficiency     Past Surgical History:  Procedure Laterality Date  . BUNIONECTOMY  2004   left  . COLONOSCOPY    . FOOT OSTEOTOMY  2003   right  . HERNIA REPAIR  1957  . KNEE ARTHROSCOPY  1987&2003   left  . MELANOMA EXCISION Right    shoulder  . SHOULDER ARTHROSCOPY WITH OPEN ROTATOR CUFF REPAIR  2013   right  . SKIN SURGERY  4/05   Melanoma surg left shoulder  . TOTAL KNEE ARTHROPLASTY Left 09/01/2015   Procedure: TOTAL KNEE ARTHROPLASTY;  Surgeon: Elsie Saas, MD;  Location: [facility];  Service: Orthopedics;  Laterality: Left;  . TUBAL LIGATION  1990    There were no vitals filed for  this visit.  Subjective Assessment - 09/14/18 0854    Subjective  Pt. doing well.  Still having difficulty turning key in ignition and hanging clothes on clothes hangers.      Patient Stated Goals  "total usage of fingers and hand back"    Currently in Pain?  No/denies    Pain Score  0-No pain    Multiple Pain Sites  No                       [facility] Adult PT Treatment/Exercise - 09/14/18 0904      Elbow Exercises   Forearm Supination  Right;10 reps    Forearm Supination Limitations  hammer with hand grip at base of black rubber handle (conservative resistance)     Forearm Pronation  Right;10 reps;Strengthening    Forearm Pronation Limitations  hammer with hand grip at base of black rubber handle (conservative resistance)       Shoulder Exercises: ROM/Strengthening   UBE (Upper Arm Bike)  L3.0 x 6 min (3' fwd/3'back)      Hand Exercises   Digiticizer  R hand key grip thumb to each finger - Green (5.0#) x 20 each    Other Hand Exercises  Stovall hand grip 35# 2 x 20    Other Hand Exercises  R hand key turns with yelllow TB with 3-point grip x 10 reps each way       Wrist Exercises   Wrist Flexion  Right;15 reps    Bar Weights/Barbell (Wrist Flexion)  1 lb    Wrist Extension  Right;15 reps    Bar Weights/Barbell (Wrist Extension)  1 lb    Wrist Radial Deviation  Right;10 reps    Wrist Radial Deviation Limitations  hammer with hand at base of black grip (conservative resistance)    Wrist Ulnar Deviation  Right;10 reps    Wrist Ulnar Deviation Limitations  hammer with hand on base of black grip (conservative resistance)                PT Short Term Goals - 07/26/18 0807      PT SHORT TERM GOAL #1   Title  Independent with initial HEP    Status  Achieved        PT Long Term Goals - 09/11/18 0936      PT LONG TERM GOAL #1   Title  Independent with ongoing HEP    Status  Partially Met      PT LONG TERM GOAL #2   Title  R gross grip strength within  5# of L hand    Status  On-going      PT LONG TERM GOAL #3   Title  R lateral pinch strength within 3# of L hand    Status  On-going      PT LONG TERM GOAL #4   Title  Pt will report ability to use R hand functionally with daily tasks such as lifting light to medium weight objects or starting car    Status  Partially Met            Plan - 09/14/18 0859    Clinical Impression Statement  Pt. reporting she felt fine after last visit.  Continues to report most difficulty with turning key in car ignition and hanging clothes.  Able to progress grip and intrinsic hand strengthening activities without issue today.  Progressing well toward goals.      Rehab Potential  Good    PT Frequency  2x / week    PT Duration  6 weeks    PT Treatment/Interventions  Patient/family education;ADLs/Self Care Home Management;Therapeutic exercise;Therapeutic activities;Electrical Stimulation;Ultrasound;Iontophoresis 58m/ml Dexamethasone;Moist Heat;Cryotherapy;Manual techniques;Passive range of motion;Dry needling;Taping    PT Next Visit Plan  Wrist & instrinsic hand muscle strengthening, especially pinch grip activities, with functional activity carryover/simulation    PT Home Exercise Plan  putty exercises; rubberband exercises    Consulted and Agree with Plan of Care  Patient       Patient will benefit from skilled therapeutic intervention in order to improve the following deficits and impairments:  Decreased strength, Impaired UE functional use, Increased edema, Increased muscle spasms  Visit Diagnosis: Muscle weakness (generalized)  Localized edema     Problem List Patient Active Problem List   Diagnosis Date Noted  . Anxiety about health 05/17/2018  . OSA (obstructive sleep apnea) 12/27/2017  . Hx of adenomatous colonic polyps 01/17/2017  . Postoperative nausea and vomiting 09/02/2015  . Primary localized osteoarthritis of left knee   . IBS (irritable bowel syndrome)   . Physical exam  05/30/2015  . Hypothyroidism 11/22/2014  . Vitamin D deficiency 09/21/2009  . Hyperlipidemia 09/21/2009  . SKIN CANCER, HX OF 09/15/2009  .  Anxiety state 12/19/2007  . GERD 12/19/2007  . ALLERGY 12/19/2007    Bess Harvest, PTA 09/14/18 2:56 PM   [facility] [address]  [place], [place], [address] Phone: [phone]   Fax:  [phone]  Name: LYNNET HEFLEY MRN: [id number] Date of Birth: [birth year]

## 2018-09-15 ENCOUNTER — Telehealth: Payer: Self-pay | Admitting: Podiatry

## 2018-09-15 ENCOUNTER — Ambulatory Visit (INDEPENDENT_AMBULATORY_CARE_PROVIDER_SITE_OTHER): Payer: Self-pay | Admitting: Podiatry

## 2018-09-15 DIAGNOSIS — L600 Ingrowing nail: Secondary | ICD-10-CM

## 2018-09-15 NOTE — Telephone Encounter (Signed)
I called pt and pt states the whole toe is red, and there is swelling down into the foot, the toenail site looks soupy also. I told pt the swelling into the foot from the toe is concerning and she should come in to be seen. Pt is scheduled to come in 1:15pm today.

## 2018-09-15 NOTE — Telephone Encounter (Signed)
Pt was seen on Tuesday for blister on her foot and states that the area still looks bad and appears 'soupy'. Pt is concerned that the area isn't healing and wanted to know what she should do. She has a follow up appt on 12/19.

## 2018-09-17 NOTE — Progress Notes (Signed)
Subjective:   Patient ID: [name] [name], female   DOB: 67 y.o.   MRN: [id number]   HPI Patient presents stating of had some drainage my third digit left and I wanted to get it checked again to make sure everything was okay after having nail surgery   ROS      Objective:  Physical Exam  Neurovascular status intact with redness and drainage of the distal portion of the third nailbed left that is localized with the area beginning to crust over and no proximal edema erythema drainage noted     Assessment:  Paronychia of the left third digit localized in nature with no indications of proximal spread currently     Plan:  Reviewed condition recommended continued soaks and bandage usage during the day of finishing antibiotics.  Gave strict instructions of any erythema or any systemic signs of infection were to occur patient is to reappoint immediately or systemic signs of infection are present to go to the emergency room but it does look like it can heal uneventfully and hopefully should heal in the next week or 2

## 2018-09-19 ENCOUNTER — Ambulatory Visit (INDEPENDENT_AMBULATORY_CARE_PROVIDER_SITE_OTHER): Payer: Medicare HMO

## 2018-09-19 DIAGNOSIS — R002 Palpitations: Secondary | ICD-10-CM | POA: Diagnosis not present

## 2018-09-21 ENCOUNTER — Encounter: Payer: Self-pay | Admitting: Family Medicine

## 2018-09-21 ENCOUNTER — Ambulatory Visit: Payer: Medicare HMO

## 2018-09-21 ENCOUNTER — Telehealth: Payer: Self-pay | Admitting: General Practice

## 2018-09-21 ENCOUNTER — Encounter: Payer: Medicare HMO | Admitting: Physical Therapy

## 2018-09-21 DIAGNOSIS — L600 Ingrowing nail: Secondary | ICD-10-CM

## 2018-09-21 MED ORDER — OMEPRAZOLE 20 MG PO CPDR: 20.0000 mg | DELAYED_RELEASE_CAPSULE | Freq: Every day | ORAL | 1 refills | Status: DC

## 2018-09-21 MED ORDER — NP THYROID 60 MG PO TABS: ORAL_TABLET | ORAL | 1 refills | Status: DC

## 2018-09-21 NOTE — Addendum Note (Signed)
Addended by: Davis Gourd on: 09/21/2018 08:56 AM   Modules accepted: Orders

## 2018-09-21 NOTE — Telephone Encounter (Signed)
caleld pt and left a detailed message with her husband to have her return call/mychart to the office to advise how to proceed.

## 2018-09-21 NOTE — Telephone Encounter (Signed)
Please contact pt and ask her how she would like to proceed.  She can either pay out of pocket or we can switch

## 2018-09-21 NOTE — Telephone Encounter (Signed)
Received a fax from pt pharmacy to inform that NP thyroid 60mg  is not covered y insurance. Instead they are recommending levothyroxine.

## 2018-09-22 ENCOUNTER — Ambulatory Visit: Payer: Medicare HMO | Admitting: Physical Therapy

## 2018-09-22 ENCOUNTER — Encounter: Payer: Self-pay | Admitting: General Practice

## 2018-09-22 ENCOUNTER — Encounter: Payer: Self-pay | Admitting: Physical Therapy

## 2018-09-22 DIAGNOSIS — M6281 Muscle weakness (generalized): Secondary | ICD-10-CM | POA: Diagnosis not present

## 2018-09-22 DIAGNOSIS — R600 Localized edema: Secondary | ICD-10-CM | POA: Diagnosis not present

## 2018-09-22 NOTE — Therapy (Signed)
[facility] [address]  [place], [place], [address] Phone: [phone]   Fax:  [phone]  Physical Therapy Treatment  Patient Details  Name: [name] [name] MRN: [id number] Date of Birth: 10/19/1950 Referring Provider (PT): Eustace Moore, MD   Encounter Date: 09/22/2018  PT End of Session - 09/22/18 0929    Visit Number  13    Number of Visits  16    Date for PT Re-Evaluation  09/29/18    Authorization Type  [employer]    PT Start Time  0929    PT Stop Time  1012    PT Time Calculation (min)  43 min    Activity Tolerance  Patient tolerated treatment well    Behavior During Therapy  [facility] for tasks assessed/performed       Past Medical History:  Diagnosis Date  . Allergy, unspecified not elsewhere classified   . Borderline hypertension   . Bronchitis   . DJD (degenerative joint disease)   . GERD (gastroesophageal reflux disease)   . History of skin cancer   . Hypercholesterolemia   . Hypothyroidism   . IBS (irritable bowel syndrome)   . OSA (obstructive sleep apnea) 12/27/2017  . Plantar fasciitis   . PONV (postoperative nausea and vomiting)   . Postoperative nausea and vomiting 09/02/2015  . Primary localized osteoarthritis of left knee   . Skin cancer (melanoma) (HCC)    basil cell   . Vitamin D deficiency     Past Surgical History:  Procedure Laterality Date  . BUNIONECTOMY  2004   left  . COLONOSCOPY    . FOOT OSTEOTOMY  2003   right  . HERNIA REPAIR  1957  . KNEE ARTHROSCOPY  1987&2003   left  . MELANOMA EXCISION Right    shoulder  . SHOULDER ARTHROSCOPY WITH OPEN ROTATOR CUFF REPAIR  2013   right  . SKIN SURGERY  4/05   Melanoma surg left shoulder  . TOTAL KNEE ARTHROPLASTY Left 09/01/2015   Procedure: TOTAL KNEE ARTHROPLASTY;  Surgeon: Elsie Saas, MD;  Location: [place];  Service: Orthopedics;  Laterality: Left;  . TUBAL LIGATION  1990    There were no vitals filed for  this visit.  Subjective Assessment - 09/22/18 0932    Subjective  Pt doing well - no concerns.    Patient Stated Goals  "total usage of fingers and hand back"    Currently in Pain?  No/denies                       [facility] Adult PT Treatment/Exercise - 09/22/18 0929      Elbow Exercises   Forearm Supination  Right;15 reps;Theraband;Seated    Theraband Level (Supination)  Level 2 (Red)    Forearm Supination Limitations  key grip hold on red TB with one end of band wrapped back over thumb    Forearm Pronation Limitations  key grip hold on red TB      Shoulder Exercises: ROM/Strengthening   UBE (Upper Arm Bike)  L3.0 x 6 min (3' fwd/3'back)      Hand Exercises   PIPJ Extension  Right;15 reps    PIPJ Extension Limitations  figure 8 green rubberband    DIPJ Flexion  Both;10 reps    DIPJ Flexion Limitations  2 hand lateral grip (4th & 5th digits) with green theraputty pull     Digit Composite ABduction  Right;15 reps;Strengthening  Digit Composite ABduction Limitations  white rubberband    Thumb Opposition  White rubberband from 5th digit to thumb around back of hand - R thumb opposition to MCP joints of lateral 4 digits x 10    Digit Abduction/Adduction  R hand isolated digit abduction with black rubberband x10 each    Digiticizer  R hand key grip thumb to each finger - Green (5.0#) x 20 each      Wrist Exercises   Wrist Flexion  Right;15 reps;Theraband;Strengthening;Seated    Theraband Level (Wrist Flexion)  Level 3 (Green)    Wrist Extension  Right;15 reps;Theraband;Strengthening;Seated    Theraband Level (Wrist Extension)  Level 3 (Green)    Wrist Radial Deviation  Right;15 reps;Theraband;Strengthening;Seated    Theraband Level (Radial Deviation)  Level 3 (Green)    Wrist Ulnar Deviation  Right;15 reps;Theraband;Strengthening;Seated    Theraband Level (Ulnar Deviation)  Level 3 (Green)    Other wrist exercises  B wrist rotation with tethered green TB on PVC x 30-35  revolutions in each direction - 3 sets each palms down & up               PT Short Term Goals - 07/26/18 0807      PT SHORT TERM GOAL #1   Title  Independent with initial HEP    Status  Achieved        PT Long Term Goals - 09/11/18 0936      PT LONG TERM GOAL #1   Title  Independent with ongoing HEP    Status  Partially Met      PT LONG TERM GOAL #2   Title  R gross grip strength within 5# of L hand    Status  On-going      PT LONG TERM GOAL #3   Title  R lateral pinch strength within 3# of L hand    Status  On-going      PT LONG TERM GOAL #4   Title  Pt will report ability to use R hand functionally with daily tasks such as lifting light to medium weight objects or starting car    Status  Partially Met            Plan - 09/22/18 0935    Clinical Impression Statement  HEPs reviewed and updated with green theraputty provided for home use. Minor cues/clarifcation necessary with positioning of rubberbands/therabands to isolated desired resistance patterns but pt reporting good undertanding after review. Anticipate transiton to HEP as of next visit with option for 30 day hold per pt preference.    Rehab Potential  Good    PT Frequency  --    PT Duration  --    PT Treatment/Interventions  Patient/family education;ADLs/Self Care Home Management;Therapeutic exercise;Therapeutic activities;Electrical Stimulation;Ultrasound;Iontophoresis 63m/ml Dexamethasone;Moist Heat;Cryotherapy;Manual techniques;Passive range of motion;Dry needling;Taping    PT Next Visit Plan  Goal/discharge assessment; provide green TB as needed    PT Home Exercise Plan  putty exercises; rubberband exercises; theraband exercises    Consulted and Agree with Plan of Care  Patient       Patient will benefit from skilled therapeutic intervention in order to improve the following deficits and impairments:  Decreased strength, Impaired UE functional use, Increased edema, Increased muscle  spasms  Visit Diagnosis: Muscle weakness (generalized)  Localized edema     Problem List Patient Active Problem List   Diagnosis Date Noted  . Anxiety about health 05/17/2018  . OSA (obstructive sleep apnea) 12/27/2017  .  Hx of adenomatous colonic polyps 01/17/2017  . Postoperative nausea and vomiting 09/02/2015  . Primary localized osteoarthritis of left knee   . IBS (irritable bowel syndrome)   . Physical exam 05/30/2015  . Hypothyroidism 11/22/2014  . Vitamin D deficiency 09/21/2009  . Hyperlipidemia 09/21/2009  . SKIN CANCER, HX OF 09/15/2009  . Anxiety state 12/19/2007  . GERD 12/19/2007  . ALLERGY 12/19/2007    Percival Spanish, PT, MPT 09/22/2018, 11:47 AM  [facility] [address]  [place], [place], [address] Phone: [phone]   Fax:  [phone]  Name: [name] [name] MRN: [id number] Date of Birth: 01/28/1951

## 2018-09-22 NOTE — Progress Notes (Signed)
Patient is here today for follow-up appointment, procedure performed 2 weeks prior ingrown nail removal.  She says that the toe is starting to feel a lot better, and the swelling is going down.  Noted previous phenol reaction to be improving.  No redness, no erythema, swelling is improved, no other signs and symptoms of infection.  There is a small amount of serosanguineous drainage coming from the nailbed, but otherwise the area appears to be healing well.  Discussed with her to complete her antibiotics.  We talked about signs and symptoms of infection.  Verbal and written instructions were given to the patient.  She is to come back as needed with any acute symptom changes.

## 2018-09-22 NOTE — Telephone Encounter (Signed)
Mychart message also sent to pt to inform

## 2018-09-25 ENCOUNTER — Ambulatory Visit: Payer: Medicare HMO

## 2018-09-25 DIAGNOSIS — R600 Localized edema: Secondary | ICD-10-CM | POA: Diagnosis not present

## 2018-09-25 DIAGNOSIS — M6281 Muscle weakness (generalized): Secondary | ICD-10-CM | POA: Diagnosis not present

## 2018-09-25 NOTE — Therapy (Addendum)
[facility] [address]  [place], [place], [address] Phone: [phone]   Fax:  [phone]  Physical Therapy Treatment / Discharge Summary  Patient Details  Name: [name] [name] MRN: [id number] Date of Birth: 08/28/1951 Referring Provider (PT): Eustace Moore, MD   Encounter Date: 09/25/2018  PT End of Session - 09/25/18 0848    Visit Number  14    Number of Visits  16    Date for PT Re-Evaluation  09/29/18    Authorization Type  [employer]    PT Start Time  0845    PT Stop Time  0923    PT Time Calculation (min)  38 min    Activity Tolerance  Patient tolerated treatment well    Behavior During Therapy  [facility] for tasks assessed/performed       Past Medical History:  Diagnosis Date  . Allergy, unspecified not elsewhere classified   . Borderline hypertension   . Bronchitis   . DJD (degenerative joint disease)   . GERD (gastroesophageal reflux disease)   . History of skin cancer   . Hypercholesterolemia   . Hypothyroidism   . IBS (irritable bowel syndrome)   . OSA (obstructive sleep apnea) 12/27/2017  . Plantar fasciitis   . PONV (postoperative nausea and vomiting)   . Postoperative nausea and vomiting 09/02/2015  . Primary localized osteoarthritis of left knee   . Skin cancer (melanoma) (HCC)    basil cell   . Vitamin D deficiency     Past Surgical History:  Procedure Laterality Date  . BUNIONECTOMY  2004   left  . COLONOSCOPY    . FOOT OSTEOTOMY  2003   right  . HERNIA REPAIR  1957  . KNEE ARTHROSCOPY  1987&2003   left  . MELANOMA EXCISION Right    shoulder  . SHOULDER ARTHROSCOPY WITH OPEN ROTATOR CUFF REPAIR  2013   right  . SKIN SURGERY  4/05   Melanoma surg left shoulder  . TOTAL KNEE ARTHROPLASTY Left 09/01/2015   Procedure: TOTAL KNEE ARTHROPLASTY;  Surgeon: Elsie Saas, MD;  Location: [facility];  Service: Orthopedics;  Laterality: Left;  . TUBAL LIGATION  1990    There were  no vitals filed for this visit.  Subjective Assessment - 09/25/18 0848    Subjective  Pt. reporting she plans to go on 30-day hold after today's session.      Patient Stated Goals  "total usage of fingers and hand back"    Currently in Pain?  No/denies    Pain Score  0-No pain    Multiple Pain Sites  No         [facility] PT Assessment - 09/25/18 0001      Strength   Right Elbow Flexion  5/5    Right Elbow Extension  5/5    Right Forearm Pronation  5/5    Right Forearm Supination  5/5    Right Wrist Flexion  4+/5    Right Wrist Extension  4+/5    Right Wrist Radial Deviation  4+/5    Right Wrist Ulnar Deviation  4+/5    Right Hand Grip (lbs)  51   49, 51, 53#   Right Hand Lateral Pinch  8.15 lbs   8.5#, 8.0, 8.0   Right Hand 3 Point Pinch  12.15 lbs   12.0, 12.0, 12.5#   Left Hand Gross Grasp  --   59#   Left Hand Grip (lbs)  59    Left Hand Lateral Pinch  12.15 lbs   11.5, 12.5, 12.5#   Left Hand 3 Point Pinch  9.5 lbs   9.5, 10.0, 9.0#                  [facility] Adult PT Treatment/Exercise - 09/25/18 0001      Self-Care   Self-Care  Other Self-Care Comments    Other Self-Care Comments   final review of comprehensive HEP with green TB issued to pt. for supination/pronation (see pt. eduction section)      Elbow Exercises   Forearm Supination  Right;Theraband;Seated;10 reps    Theraband Level (Supination)  Level 3 (Green)      Shoulder Exercises: ROM/Strengthening   UBE (Upper Arm Bike)  L3.0 x 6 min (3' fwd/3'back)      Wrist Exercises   Wrist Flexion  Right;15 reps;Theraband;Strengthening;Seated    Theraband Level (Wrist Flexion)  Level 3 (Green)    Wrist Extension  Right;15 reps;Theraband;Strengthening;Seated    Theraband Level (Wrist Extension)  Level 3 (Green)    Wrist Radial Deviation  Right;15 reps;Theraband;Strengthening;Seated    Theraband Level (Radial Deviation)  Level 3 (Green)    Wrist Ulnar Deviation  Right;15  reps;Theraband;Strengthening;Seated    Theraband Level (Ulnar Deviation)  Level 3 Nyoka Cowden)             PT Education - 09/25/18 1555    Education Details  HEP update - green TB issued to pt. for supination/pronation     Person(s) Educated  Patient    Methods  Explanation;Demonstration;Verbal cues;Handout    Comprehension  Verbalized understanding;Returned demonstration;Verbal cues required;Need further instruction       PT Short Term Goals - 07/26/18 3149      PT SHORT TERM GOAL #1   Title  Independent with initial HEP    Status  Achieved        PT Long Term Goals - 09/25/18 0904      PT LONG TERM GOAL #1   Title  Independent with ongoing HEP    Status  Achieved      PT LONG TERM GOAL #2   Title  R gross grip strength within 5# of L hand    Status  Not Met   8# difference      PT LONG TERM GOAL #3   Title  R lateral pinch strength within 3# of L hand    Status  Not Met   4# difference      PT LONG TERM GOAL #4   Title  Pt will report ability to use R hand functionally with daily tasks such as lifting light to medium weight objects or starting car    Status  Achieved            Plan - 09/25/18 0850    Clinical Impression Statement  Pt. has made good progress with therapy.  Notes she wishes to go on 30-day hold from therapy following today's visit as previously discussed last session.  Able to demo R grip strength to within 8# of L grip strength, not quite meeting goal however demonstrating excellent improvement from initial measurement at eval.  Pt. able to demo R lateral pinch grip strength to within 4# of L, not quite meeting goal, however demonstrating excellent improvement in this area since initial measurement.  Pt. now noting she is able to start car most days without having to alter her grip on keys due to weakness and denies limitation in  other areas of her day-to-day activities.  Ended visit with review of pt.'s comprehensive HEP and updated  pronation/supination activity with green TB issued to pt.  Supervising PT approving 30-day hold for pt. today with pt. now on 30-day hold from therapy.     Rehab Potential  Good    PT Frequency  2x / week    PT Duration  6 weeks    PT Treatment/Interventions  Patient/family education;ADLs/Self Care Home Management;Therapeutic exercise;Therapeutic activities;Electrical Stimulation;Ultrasound;Iontophoresis 31m/ml Dexamethasone;Moist Heat;Cryotherapy;Manual techniques;Passive range of motion;Dry needling;Taping    PT Next Visit Plan  Pt. on 30-day hold from therapy    Consulted and Agree with Plan of Care  Patient       Patient will benefit from skilled therapeutic intervention in order to improve the following deficits and impairments:  Decreased strength, Impaired UE functional use, Increased edema, Increased muscle spasms  Visit Diagnosis: Muscle weakness (generalized)  Localized edema     Problem List Patient Active Problem List   Diagnosis Date Noted  . Anxiety about health 05/17/2018  . OSA (obstructive sleep apnea) 12/27/2017  . Hx of adenomatous colonic polyps 01/17/2017  . Postoperative nausea and vomiting 09/02/2015  . Primary localized osteoarthritis of left knee   . IBS (irritable bowel syndrome)   . Physical exam 05/30/2015  . Hypothyroidism 11/22/2014  . Vitamin D deficiency 09/21/2009  . Hyperlipidemia 09/21/2009  . SKIN CANCER, HX OF 09/15/2009  . Anxiety state 12/19/2007  . GERD 12/19/2007  . ALLERGY 12/19/2007    MBess Harvest PTA 09/25/18 3:56 PM  [facility] [address] [place] [address]Phone: [phone]  Fax:  [phone] Name: [name] HIRTMRN: [id number] of Birth: 230-Mar-1952 PHYSICAL THERAPY DISCHARGE SUMMARY  Visits from Start of Care: 14  Current functional level related to goals / functional outcomes:   Refer to above clinical impression for status as  of last visit on 09/25/18. Pt was placed on hold for 30 days and has not needed to return to PT, therefore will proceed with discharge from PT for this episode.   Remaining deficits:   As above.   Education / Equipment:   HEP  Plan: Patient agrees to discharge.  Patient goals were partially met. Patient is being discharged due to being pleased with the current functional level.  ?????     JPercival Spanish PT, MPT 10/25/18, 9:49 AM  [facility] [place] [address]Phone: [phone]  Fax:  [phone]

## 2018-10-10 DIAGNOSIS — H401131 Primary open-angle glaucoma, bilateral, mild stage: Secondary | ICD-10-CM | POA: Diagnosis not present

## 2018-10-23 ENCOUNTER — Other Ambulatory Visit: Payer: Self-pay | Admitting: Family Medicine

## 2018-10-30 DIAGNOSIS — M545 Low back pain: Secondary | ICD-10-CM | POA: Diagnosis not present

## 2018-10-30 DIAGNOSIS — M25551 Pain in right hip: Secondary | ICD-10-CM | POA: Diagnosis not present

## 2018-11-01 DIAGNOSIS — L821 Other seborrheic keratosis: Secondary | ICD-10-CM | POA: Diagnosis not present

## 2018-11-01 DIAGNOSIS — L570 Actinic keratosis: Secondary | ICD-10-CM | POA: Diagnosis not present

## 2018-11-01 DIAGNOSIS — Z08 Encounter for follow-up examination after completed treatment for malignant neoplasm: Secondary | ICD-10-CM | POA: Diagnosis not present

## 2018-11-01 DIAGNOSIS — Z85828 Personal history of other malignant neoplasm of skin: Secondary | ICD-10-CM | POA: Diagnosis not present

## 2018-11-01 DIAGNOSIS — L820 Inflamed seborrheic keratosis: Secondary | ICD-10-CM | POA: Diagnosis not present

## 2018-11-01 DIAGNOSIS — Z85820 Personal history of malignant melanoma of skin: Secondary | ICD-10-CM | POA: Diagnosis not present

## 2018-11-01 DIAGNOSIS — D485 Neoplasm of uncertain behavior of skin: Secondary | ICD-10-CM | POA: Diagnosis not present

## 2018-11-14 NOTE — Progress Notes (Addendum)
Subjective:   [name] [name] is a 68 y.o. female who presents for Medicare Annual (Subsequent) preventive examination.  Review of Systems:  No ROS.  Medicare Wellness Visit. Additional risk factors are reflected in the social history.  Cardiac Risk Factors include: advanced age (>89men, >66 women);dyslipidemia;obesity (BMI >30kg/m2);hypertension;family history of premature cardiovascular disease   Sleep patterns: Sleeps 8.5 hours.  Home Safety/Smoke Alarms: Feels safe in home. Smoke alarms in place.  Living environment; residence and Firearm Safety: Lives with husband in 2 story.  Seat Belt Safety/Bike Helmet: Wears seat belt.   Female:   Pap-2017     Mammo-08/09/2018, benign.   Dexa scan-12/27/2017,  Osteopenia.        CCS-Colonoscopy 03/29/2017, polyps. Recall 5 years.      Objective:     Vitals: BP 132/78 (BP Location: Left Arm, Patient Position: Sitting, Cuff Size: Normal)   Pulse 67   Temp 98 F (36.7 C) (Temporal)   Resp 16   Ht 5\' 6"  (1.676 m)   Wt 203 lb 6 oz (92.3 kg)   SpO2 98%   BMI 32.83 kg/m   Body mass index is 32.83 kg/m.  Advanced Directives 11/15/2018 07/13/2018 11/09/2017 01/05/2017 08/21/2015  Does Patient Have a Medical Advance Directive? Yes Yes Yes No Yes  Type of Advance Directive Living will;Healthcare Power of [place];Living will [place];Living will - [place];Living will  Does patient want to make changes to medical advance directive? - No - Patient declined - - No - Patient declined  Copy of [place] in Chart? No - copy requested Yes No - copy requested - No - copy requested    Tobacco Social History   Tobacco Use  Smoking Status Former Smoker  . Years: 6.00  . Last attempt to quit: 10/05/1975  . Years since quitting: 43.1  Smokeless Tobacco Never Used     Counseling given: Not Answered   Past Medical History:  Diagnosis Date  . Allergy,  unspecified not elsewhere classified   . Borderline hypertension   . Bronchitis   . Cataract   . DJD (degenerative joint disease)   . GERD (gastroesophageal reflux disease)   . Glaucoma   . History of skin cancer   . Hypercholesterolemia   . Hypothyroidism   . IBS (irritable bowel syndrome)   . OSA (obstructive sleep apnea) 12/27/2017  . Plantar fasciitis   . PONV (postoperative nausea and vomiting)   . Postoperative nausea and vomiting 09/02/2015  . Primary localized osteoarthritis of left knee   . Skin cancer (melanoma) (HCC)    basil cell   . Vitamin D deficiency    Past Surgical History:  Procedure Laterality Date  . BUNIONECTOMY  2004   left  . CATARACT EXTRACTION W/ INTRAOCULAR LENS  IMPLANT, BILATERAL    . COLONOSCOPY    . FOOT OSTEOTOMY  2003   right  . HERNIA REPAIR  1957  . KNEE ARTHROSCOPY  1987&2003   left  . MELANOMA EXCISION Right    shoulder  . SHOULDER ARTHROSCOPY WITH OPEN ROTATOR CUFF REPAIR  2013   right  . SKIN SURGERY  4/05   Melanoma surg left shoulder  . TOENAIL EXCISION  10/04/2017  . TOTAL KNEE ARTHROPLASTY Left 09/01/2015   Procedure: TOTAL KNEE ARTHROPLASTY;  Surgeon: Elsie Saas, MD;  Location: [facility];  Service: Orthopedics;  Laterality: Left;  . TUBAL LIGATION  1990   Family History  Problem  Relation Age of Onset  . Colon polyps Father   . Cancer Father        laryngeal  . Heart disease Father   . Diabetes Son   . Osteoporosis Sister   . Hypertension Brother   . Heart disease Maternal Grandfather   . Heart disease Paternal Grandfather   . Colon cancer Neg Hx   . Stomach cancer Neg Hx    Social History   Socioeconomic History  . Marital status: Married    Spouse name: Marya Amsler [phone]  . Number of children: 2  . Years of education: Not on file  . Highest education level: Not on file  Occupational History  . Not on file  Social Needs  . Financial resource strain: Not on file  . Food insecurity:    Worry: Not on file     Inability: Not on file  . Transportation needs:    Medical: Not on file    Non-medical: Not on file  Tobacco Use  . Smoking status: Former Smoker    Years: 6.00    Last attempt to quit: 10/05/1975    Years since quitting: 43.1  . Smokeless tobacco: Never Used  Substance and Sexual Activity  . Alcohol use: Yes    Alcohol/week: 12.0 standard drinks    Types: 12 Standard drinks or equivalent per week    Comment: socially  . Drug use: No  . Sexual activity: Not on file  Lifestyle  . Physical activity:    Days per week: Not on file    Minutes per session: Not on file  . Stress: Not on file  Relationships  . Social connections:    Talks on phone: Not on file    Gets together: Not on file    Attends religious service: Not on file    Active member of club or organization: Not on file    Attends meetings of clubs or organizations: Not on file    Relationship status: Not on file  Other Topics Concern  . Not on file  Social History Narrative  . Not on file    Outpatient Encounter Medications as of 11/15/2018  Medication Sig  . aspirin 325 MG tablet Take 325 mg by mouth daily.  . brimonidine (ALPHAGAN) 0.2 % ophthalmic solution Place 1 drop into both eyes 3 (three) times daily.  . cetirizine (ZYRTEC) 10 MG tablet Take 10 mg by mouth daily.  . Cholecalciferol (VITAMIN D3) 5000 UNITS CAPS Take 5,000 Units by mouth daily.   Marland Kitchen doxycycline (ADOXA) 50 MG tablet Take 50 mg by mouth daily. For rosacea  . Magnesium 200 MG TABS Take 200 mg by mouth daily.  Marland Kitchen MELATONIN PO Take 3 mg by mouth at bedtime as needed and may repeat dose one time if needed.  . Meloxicam 10 MG CAPS Take 1 tablet by mouth 2 (two) times daily as needed.  . Multiple Vitamins-Minerals (MULTIVITAMIN PO) Take 1 tablet by mouth daily.  . NP THYROID 60 MG tablet TAKE 1 TABLET(60 MG) BY MOUTH DAILY BEFORE BREAKFAST  . omeprazole (PRILOSEC) 20 MG capsule Take 1 capsule (20 mg total) by mouth daily.  Marland Kitchen POTASSIUM PO Take by  mouth.  . simvastatin (ZOCOR) 20 MG tablet TAKE 1 TABLET AT BEDTIME  . VITAMIN K PO Take by mouth.  . dorzolamide-timolol (COSOPT) 22.3-6.8 MG/ML ophthalmic solution Place 1 drop into both eyes 2 (two) times daily.  Marland Kitchen neomycin-polymyxin-hydrocortisone (CORTISPORIN) 3.5-10000-1 OTIC suspension APPLY 1 TO 2 DROPS TO TOE  AFTER SOAKING BID  . [DISCONTINUED] sulfamethoxazole-trimethoprim (BACTRIM DS,SEPTRA DS) 800-160 MG tablet Take 1 tablet by mouth 2 (two) times daily.   No facility-administered encounter medications on file as of 11/15/2018.     Activities of Daily Living In your present state of health, do you have any difficulty performing the following activities: 11/15/2018 11/15/2018  Hearing? N N  Vision? N N  Comment - -  Difficulty concentrating or making decisions? N N  Walking or climbing stairs? N N  Dressing or bathing? N N  Doing errands, shopping? N N  Preparing Food and eating ? - N  Using the Toilet? - N  In the past six months, have you accidently leaked urine? - N  Do you have problems with loss of bowel control? - N  Managing your Medications? - N  Managing your Finances? - N  Housekeeping or managing your Housekeeping? - N  Some recent data might be hidden    Patient Care Team: Midge Minium, MD as PCP - General (Family Medicine) Roque Cash., MD as Consulting Physician (Obstetrics and Gynecology) [facility], Katharine Look, MD as Referring Physician (Dermatology) Elsie Saas, MD as Consulting Physician (Orthopedic Surgery) Regal, Tamala Fothergill, DPM as Consulting Physician (Podiatry) Janann August, MD as Referring Physician (Dermatology) Darleen Crocker, MD as Consulting Physician (Ophthalmology)    Assessment:   This is a routine wellness examination for Ariele.  Exercise Activities and Dietary recommendations Current Exercise Habits: Home exercise routine, Type of exercise: walking, Frequency (Times/Week): 7, Exercise limited by: None identified   Diet  (meal preparation, eat out, water intake, caffeinated beverages, dairy products, fruits and vegetables): Drinks water and sweet tea.   Breakfast: pita chips with pimento cheese; cottage chz/fruit; OJ occasionally Lunch: sandwich Dinner: protein and veggies.   Goals    . Weight (lb) < 200 lb (90.7 kg)     Lose weight by cutting out carb/sugars in diet.     . Weight (lb) < 200 lb (90.7 kg)     Lose weight by increasing activity.        Fall Risk Fall Risk  11/15/2018 11/15/2018 09/11/2018 05/17/2018 11/09/2017  Falls in the past year? 0 0 0 No No  Number falls in past yr: 0 - - - -  Injury with Fall? 0 - - - -    Depression Screen PHQ 2/9 Scores 11/15/2018 11/15/2018 09/11/2018 05/17/2018  PHQ - 2 Score 0 0 0 0  PHQ- 9 Score 0 - 0 0     Cognitive Function MMSE - Mini Mental State Exam 11/15/2018 11/09/2017  Orientation to time 5 5  Orientation to Place 5 5  Registration 3 3  Attention/ Calculation 5 5  Recall 3 3  Language- name 2 objects 2 2  Language- repeat 1 1  Language- follow 3 step command 3 3  Language- read & follow direction 1 1  Write a sentence 1 1  Copy design 1 1  Total score 30 30        Immunization History  Administered Date(s) Administered  . Influenza Split 08/04/2012  . Influenza, High Dose Seasonal PF 06/12/2018  . Influenza,inj,Quad PF,6+ Mos 11/22/2014, 07/01/2015, 06/04/2016, 07/25/2017  . Pneumococcal Conjugate-13 05/17/2018  . Tdap 10/01/2011  . Zoster 12/03/2015  . Zoster Recombinat (Shingrix) 07/25/2017, 12/22/2017    Screening Tests Health Maintenance  Topic Date Due  . PNA vac Low Risk Adult (2 of 2 - PPSV23) 05/18/2019  . MAMMOGRAM  08/10/2019  . TETANUS/TDAP  09/30/2021  . COLONOSCOPY  03/29/2022  . INFLUENZA VACCINE  Completed  . DEXA SCAN  Completed  . Hepatitis C Screening  Completed       Plan:    Bring a copy of your living will and/or healthcare power of attorney to your next office visit.  Continue doing brain  stimulating activities (puzzles, reading, adult coloring books, staying active) to keep memory sharp.   I have personally reviewed and noted the following in the patient's chart:   . Medical and social history . Use of alcohol, tobacco or illicit drugs  . Current medications and supplements . Functional ability and status . Nutritional status . Physical activity . Advanced directives . List of other physicians . Hospitalizations, surgeries, and ER visits in previous 12 months . Vitals . Screenings to include cognitive, depression, and falls . Referrals and appointments  In addition, I have reviewed and discussed with patient certain preventive protocols, quality metrics, and best practice recommendations. A written personalized care plan for preventive services as well as general preventive health recommendations were provided to patient.     Gerilyn Nestle, RN  11/15/2018  Reviewed documentation provided by RN and agree w/ above.  Annye Asa, MD

## 2018-11-15 ENCOUNTER — Other Ambulatory Visit: Payer: Self-pay

## 2018-11-15 ENCOUNTER — Ambulatory Visit (INDEPENDENT_AMBULATORY_CARE_PROVIDER_SITE_OTHER): Payer: Medicare HMO

## 2018-11-15 ENCOUNTER — Ambulatory Visit (INDEPENDENT_AMBULATORY_CARE_PROVIDER_SITE_OTHER): Payer: Medicare HMO | Admitting: Family Medicine

## 2018-11-15 ENCOUNTER — Encounter: Payer: Self-pay | Admitting: Family Medicine

## 2018-11-15 ENCOUNTER — Encounter: Payer: Self-pay | Admitting: General Practice

## 2018-11-15 VITALS — BP 132/78 | HR 67 | Temp 98.0°F | Resp 16 | Ht 66.0 in | Wt 203.4 lb

## 2018-11-15 DIAGNOSIS — E559 Vitamin D deficiency, unspecified: Secondary | ICD-10-CM | POA: Diagnosis not present

## 2018-11-15 DIAGNOSIS — E785 Hyperlipidemia, unspecified: Secondary | ICD-10-CM | POA: Diagnosis not present

## 2018-11-15 DIAGNOSIS — E038 Other specified hypothyroidism: Secondary | ICD-10-CM | POA: Diagnosis not present

## 2018-11-15 DIAGNOSIS — Z0000 Encounter for general adult medical examination without abnormal findings: Secondary | ICD-10-CM

## 2018-11-15 DIAGNOSIS — E669 Obesity, unspecified: Secondary | ICD-10-CM | POA: Insufficient documentation

## 2018-11-15 LAB — VITAMIN D 25 HYDROXY (VIT D DEFICIENCY, FRACTURES): VITD: 78.57 ng/mL (ref 30.00–100.00)

## 2018-11-15 LAB — HEPATIC FUNCTION PANEL
ALT: 23 U/L (ref 0–35)
AST: 23 U/L (ref 0–37)
Albumin: 4.3 g/dL (ref 3.5–5.2)
Alkaline Phosphatase: 104 U/L (ref 39–117)
Bilirubin, Direct: 0.1 mg/dL (ref 0.0–0.3)
Total Bilirubin: 0.6 mg/dL (ref 0.2–1.2)
Total Protein: 7.1 g/dL (ref 6.0–8.3)

## 2018-11-15 LAB — BASIC METABOLIC PANEL
BUN: 15 mg/dL (ref 6–23)
CO2: 28 mEq/L (ref 19–32)
Calcium: 9.6 mg/dL (ref 8.4–10.5)
Chloride: 102 mEq/L (ref 96–112)
Creatinine, Ser: 0.77 mg/dL (ref 0.40–1.20)
GFR: 74.78 mL/min (ref >60.00)
GLUCOSE: 93 mg/dL (ref 70–99)
Potassium: 4.2 mEq/L (ref 3.5–5.1)
Sodium: 140 mEq/L (ref 135–145)

## 2018-11-15 LAB — CBC WITH DIFFERENTIAL/PLATELET
BASOS ABS: 0 10*3/uL (ref 0.0–0.1)
Basophils Relative: 0.7 % (ref 0.0–3.0)
Eosinophils Absolute: 0.1 10*3/uL (ref 0.0–0.7)
Eosinophils Relative: 1.6 % (ref 0.0–5.0)
HCT: 38.7 % (ref 36.0–46.0)
Hemoglobin: 12.3 g/dL (ref 12.0–15.0)
Lymphocytes Relative: 26.8 % (ref 12.0–46.0)
Lymphs Abs: 1.8 10*3/uL (ref 0.7–4.0)
MCHC: 31.9 g/dL (ref 30.0–36.0)
MCV: 77.2 fl — AB (ref 78.0–100.0)
Monocytes Absolute: 0.4 10*3/uL (ref 0.1–1.0)
Monocytes Relative: 6.6 % (ref 3.0–12.0)
Neutro Abs: 4.2 10*3/uL (ref 1.4–7.7)
Neutrophils Relative %: 64.3 % (ref 43.0–77.0)
Platelets: 305 10*3/uL (ref 150.0–400.0)
RBC: 5.01 Mil/uL (ref 3.87–5.11)
RDW: 16.7 % — ABNORMAL HIGH (ref 11.5–15.5)
WBC: 6.5 10*3/uL (ref 4.0–10.5)

## 2018-11-15 LAB — TSH: TSH: 2.21 u[IU]/mL (ref 0.35–4.50)

## 2018-11-15 LAB — LIPID PANEL
Cholesterol: 182 mg/dL (ref 0–200)
HDL: 64.5 mg/dL (ref >39.00)
LDL CALC: 99 mg/dL (ref 0–99)
NonHDL: 117.04
Total CHOL/HDL Ratio: 3
Triglycerides: 88 mg/dL (ref 0.0–149.0)
VLDL: 17.6 mg/dL (ref 0.0–40.0)

## 2018-11-15 MED ORDER — NP THYROID 60 MG PO TABS: ORAL_TABLET | ORAL | 1 refills | Status: DC

## 2018-11-15 MED ORDER — SIMVASTATIN 20 MG PO TABS: 20.0000 mg | ORAL_TABLET | Freq: Every day | ORAL | 1 refills | Status: DC

## 2018-11-15 MED ORDER — OMEPRAZOLE 20 MG PO CPDR: 20.0000 mg | DELAYED_RELEASE_CAPSULE | Freq: Every day | ORAL | 1 refills | Status: DC

## 2018-11-15 NOTE — Patient Instructions (Addendum)

## 2018-11-15 NOTE — Progress Notes (Signed)
   Subjective:    Patient ID: [name] [name], female    DOB: Jul 03, 1951, 68 y.o.   MRN: [id number]  HPI CPE- UTD on colonoscopy, mammo, DEXA, immunizations.   Review of Systems Patient reports no vision/ hearing changes, adenopathy,fever, weight change,  persistant/recurrent hoarseness , swallowing issues, chest pain, palpitations, edema, persistant/recurrent cough, hemoptysis, dyspnea (rest/exertional/paroxysmal nocturnal), gastrointestinal bleeding (melena, rectal bleeding), abdominal pain, significant heartburn, bowel changes, GU symptoms (dysuria, hematuria, incontinence), Gyn symptoms (abnormal  bleeding, pain),  syncope, focal weakness, memory loss, numbness & tingling, skin/hair/nail changes, abnormal bruising or bleeding, anxiety, or depression.     Objective:   Physical Exam General Appearance:    Alert, cooperative, no distress, appears stated age  Head:    Normocephalic, without obvious abnormality, atraumatic  Eyes:    PERRL, conjunctiva/corneas clear, EOM's intact, fundi    benign, both eyes  Ears:    Normal TM's and external ear canals, both ears  Nose:   Nares normal, septum midline, mucosa normal, no drainage    or sinus tenderness  Throat:   Lips, mucosa, and tongue normal; teeth and gums normal  Neck:   Supple, symmetrical, trachea midline, no adenopathy;    Thyroid: no enlargement/tenderness/nodules  Back:     Symmetric, no curvature, ROM normal, no CVA tenderness  Lungs:     Clear to auscultation bilaterally, respirations unlabored  Chest Wall:    No tenderness or deformity   Heart:    Regular rate and rhythm, S1 and S2 normal, no murmur, rub   or gallop  Breast Exam:    Deferred to mammo  Abdomen:     Soft, non-tender, bowel sounds active all four quadrants,    no masses, no organomegaly  Genitalia:    Deferred  Rectal:    Extremities:   Extremities normal, atraumatic, no cyanosis or edema  Pulses:   2+ and symmetric all extremities  Skin:   Skin color,  texture, turgor normal, no rashes or lesions  Lymph nodes:   Cervical, supraclavicular, and axillary nodes normal  Neurologic:   CNII-XII intact, normal strength, sensation and reflexes    throughout          Assessment & Plan:

## 2018-11-15 NOTE — Patient Instructions (Signed)
Follow up in 6 months to recheck cholesterol We'll notify you of your lab results and make any changes if needed Continue to work on healthy diet and regular exercise- you can do it! Call with any questions or concerns Happy Early Birthday!!!

## 2018-11-15 NOTE — Assessment & Plan Note (Signed)
Chronic problem.  Currently asymptomatic.  Check labs.  Adjust meds prn  

## 2018-11-15 NOTE — Assessment & Plan Note (Signed)
Chronic problem.  Tolerating statin w/o difficulty.  Check labs.  Adjust meds prn  

## 2018-11-15 NOTE — Assessment & Plan Note (Signed)
Ongoing issue for pt.  Stressed need for healthy diet and regular exercise.  Check labs to risk stratify.  Will follow 

## 2018-11-15 NOTE — Assessment & Plan Note (Signed)
Pt w/ hx of this.  Check labs and replete prn.

## 2018-11-15 NOTE — Assessment & Plan Note (Signed)
Pt's PE WNL w/ exception of obesity.  UTD on mammo, colonoscopy, DEXA, immunizations.  Check labs.  Anticipatory guidance provided.  

## 2018-11-19 LAB — HM MAMMOGRAPHY: HM Mammogram: NORMAL (ref 0–4)

## 2019-01-02 ENCOUNTER — Encounter: Payer: Self-pay | Admitting: Family Medicine

## 2019-01-03 ENCOUNTER — Encounter: Payer: Self-pay | Admitting: Family Medicine

## 2019-01-08 DIAGNOSIS — N6323 Unspecified lump in the left breast, lower outer quadrant: Secondary | ICD-10-CM | POA: Diagnosis not present

## 2019-01-08 DIAGNOSIS — N644 Mastodynia: Secondary | ICD-10-CM | POA: Diagnosis not present

## 2019-01-10 DIAGNOSIS — N6323 Unspecified lump in the left breast, lower outer quadrant: Secondary | ICD-10-CM | POA: Diagnosis not present

## 2019-01-10 DIAGNOSIS — N644 Mastodynia: Secondary | ICD-10-CM | POA: Diagnosis not present

## 2019-01-22 DIAGNOSIS — H18413 Arcus senilis, bilateral: Secondary | ICD-10-CM | POA: Diagnosis not present

## 2019-01-22 DIAGNOSIS — Z961 Presence of intraocular lens: Secondary | ICD-10-CM | POA: Diagnosis not present

## 2019-01-22 DIAGNOSIS — H401131 Primary open-angle glaucoma, bilateral, mild stage: Secondary | ICD-10-CM | POA: Diagnosis not present

## 2019-01-22 DIAGNOSIS — H02831 Dermatochalasis of right upper eyelid: Secondary | ICD-10-CM | POA: Diagnosis not present

## 2019-02-02 DIAGNOSIS — J0100 Acute maxillary sinusitis, unspecified: Secondary | ICD-10-CM | POA: Diagnosis not present

## 2019-02-07 DIAGNOSIS — L821 Other seborrheic keratosis: Secondary | ICD-10-CM | POA: Diagnosis not present

## 2019-02-07 DIAGNOSIS — L820 Inflamed seborrheic keratosis: Secondary | ICD-10-CM | POA: Diagnosis not present

## 2019-02-07 DIAGNOSIS — L570 Actinic keratosis: Secondary | ICD-10-CM | POA: Diagnosis not present

## 2019-02-07 DIAGNOSIS — L718 Other rosacea: Secondary | ICD-10-CM | POA: Diagnosis not present

## 2019-02-07 DIAGNOSIS — I781 Nevus, non-neoplastic: Secondary | ICD-10-CM | POA: Diagnosis not present

## 2019-02-07 DIAGNOSIS — Z08 Encounter for follow-up examination after completed treatment for malignant neoplasm: Secondary | ICD-10-CM | POA: Diagnosis not present

## 2019-02-07 DIAGNOSIS — Z85820 Personal history of malignant melanoma of skin: Secondary | ICD-10-CM | POA: Diagnosis not present

## 2019-02-07 DIAGNOSIS — B078 Other viral warts: Secondary | ICD-10-CM | POA: Diagnosis not present

## 2019-04-24 DIAGNOSIS — H02831 Dermatochalasis of right upper eyelid: Secondary | ICD-10-CM | POA: Diagnosis not present

## 2019-04-24 DIAGNOSIS — Z961 Presence of intraocular lens: Secondary | ICD-10-CM | POA: Diagnosis not present

## 2019-04-24 DIAGNOSIS — H401132 Primary open-angle glaucoma, bilateral, moderate stage: Secondary | ICD-10-CM | POA: Diagnosis not present

## 2019-04-24 DIAGNOSIS — H18413 Arcus senilis, bilateral: Secondary | ICD-10-CM | POA: Diagnosis not present

## 2019-05-14 ENCOUNTER — Ambulatory Visit: Payer: Medicare HMO | Admitting: Family Medicine

## 2019-05-14 ENCOUNTER — Ambulatory Visit (INDEPENDENT_AMBULATORY_CARE_PROVIDER_SITE_OTHER): Payer: Medicare HMO | Admitting: Family Medicine

## 2019-05-14 ENCOUNTER — Other Ambulatory Visit: Payer: Self-pay

## 2019-05-14 ENCOUNTER — Encounter: Payer: Self-pay | Admitting: Family Medicine

## 2019-05-14 VITALS — BP 138/84 | HR 68 | Ht 66.0 in | Wt 206.0 lb

## 2019-05-14 DIAGNOSIS — E669 Obesity, unspecified: Secondary | ICD-10-CM | POA: Diagnosis not present

## 2019-05-14 DIAGNOSIS — R51 Headache: Secondary | ICD-10-CM | POA: Diagnosis not present

## 2019-05-14 DIAGNOSIS — E785 Hyperlipidemia, unspecified: Secondary | ICD-10-CM

## 2019-05-14 DIAGNOSIS — R519 Headache, unspecified: Secondary | ICD-10-CM

## 2019-05-14 NOTE — Progress Notes (Signed)
I have discussed the procedure for the virtual visit with the patient who has given consent to proceed with assessment and treatment.   Neysha Criado L Natiya Seelinger, CMA     

## 2019-05-14 NOTE — Progress Notes (Signed)
Virtual Visit via Video   I connected with patient on 05/14/19 at  8:30 AM EDT by a video enabled telemedicine application and verified that I am speaking with the correct person using two identifiers.  Location patient: Home Location provider: Acup[facility]ist, Office Persons participating in the virtual visit: Patient, Provider, [place] (Jess B)  I discussed the limitations of evaluation and management by telemedicine and the availability of in person appointments. The patient expressed understanding and agreed to proceed.  Subjective:   HPI:   Hyperlipidemia- chronic problem, on Simvastatin 20mg  daily.  No CP, SOB, visual changes, abd pain, N/V.  + HAs- started after spraying her yard for bugs.  No dizziness.  Improves w/ tylenol.  Drinking plenty of water and taking daily Zyrtec.  Obesity- pt has gained weight during COVID.  She is aware that she needs to lose weight.  Walking sporadically.  Plans to be more vigilant on diet.  ROS:   See pertinent positives and negatives per HPI.  Patient Active Problem List   Diagnosis Date Noted  . Obesity (BMI 30-39.9) 11/15/2018  . Anxiety about health 05/17/2018  . OSA (obstructive sleep apnea) 12/27/2017  . Hx of adenomatous colonic polyps 01/17/2017  . Postoperative nausea and vomiting 09/02/2015  . Primary localized osteoarthritis of left knee   . IBS (irritable bowel syndrome)   . Physical exam 05/30/2015  . Hypothyroidism 11/22/2014  . Vitamin D deficiency 09/21/2009  . Hyperlipidemia 09/21/2009  . SKIN CANCER, HX OF 09/15/2009  . Anxiety state 12/19/2007  . GERD 12/19/2007  . ALLERGY 12/19/2007    Social History   Tobacco Use  . Smoking status: Former Smoker    Years: 6.00    Quit date: 10/05/1975    Years since quitting: 43.6  . Smokeless tobacco: Never Used  Substance Use Topics  . Alcohol use: Yes    Alcohol/week: 12.0 standard drinks    Types: 12 Standard drinks or equivalent per week    Comment: socially     Current Outpatient Medications:  .  aspirin 325 MG tablet, Take 325 mg by mouth daily., Disp: , Rfl:  .  brimonidine (ALPHAGAN) 0.2 % ophthalmic solution, Place 1 drop into both eyes 3 (three) times daily., Disp: , Rfl:  .  cetirizine (ZYRTEC) 10 MG tablet, Take 10 mg by mouth daily., Disp: , Rfl:  .  Cholecalciferol (VITAMIN D3) 5000 UNITS CAPS, Take 5,000 Units by mouth daily. , Disp: , Rfl:  .  dorzolamide-timolol (COSOPT) 22.3-6.8 MG/ML ophthalmic solution, Place 1 drop into both eyes 2 (two) times daily., Disp: , Rfl:  .  doxycycline (ADOXA) 50 MG tablet, Take 50 mg by mouth daily. For rosacea, Disp: , Rfl:  .  Magnesium 200 MG TABS, Take 200 mg by mouth daily., Disp: , Rfl:  .  Meloxicam 10 MG CAPS, Take 1 tablet by mouth 2 (two) times daily as needed., Disp: , Rfl:  .  Multiple Vitamins-Minerals (MULTIVITAMIN PO), Take 1 tablet by mouth daily., Disp: , Rfl:  .  neomycin-polymyxin-hydrocortisone (CORTISPORIN) 3.5-10000-1 OTIC suspension, APPLY 1 TO 2 DROPS TO TOE AFTER SOAKING BID, Disp: , Rfl: 0 .  NP THYROID 60 MG tablet, TAKE 1 TABLET(60 MG) BY MOUTH DAILY BEFORE BREAKFAST, Disp: 90 tablet, Rfl: 1 .  omeprazole (PRILOSEC) 20 MG capsule, Take 1 capsule (20 mg total) by mouth daily., Disp: 90 capsule, Rfl: 1 .  POTASSIUM PO, Take by mouth., Disp: , Rfl:  .  simvastatin (ZOCOR) 20 MG tablet, Take  1 tablet (20 mg total) by mouth at bedtime., Disp: 90 tablet, Rfl: 1 .  VITAMIN K PO, Take by mouth., Disp: , Rfl:  .  MELATONIN PO, Take 3 mg by mouth at bedtime as needed and may repeat dose one time if needed., Disp: , Rfl:   Allergies  Allergen Reactions  . Clindamycin/Lincomycin Diarrhea  . Flagyl [Metronidazole Hcl] Diarrhea    Stomach upset. Pt states she CANNOT TAKE oral, has no problem with topical.   . Augmentin [Amoxicillin-Pot Clavulanate] Diarrhea  . Telithromycin Diarrhea    Objective:   BP 138/84   Pulse 68   Ht 5\' 6"  (1.676 m)   Wt 206 lb (93.4 kg)   BMI 33.25 kg/m    AAOx3, NAD NCAT, EOMI No obvious CN deficits Coloring WNL Pt is able to speak clearly, coherently without shortness of breath or increased work of breathing.  Thought process is linear.  Mood is appropriate.   Assessment and Plan:   Hyperlipidemia- chronic problem.  Tolerating statin w/o difficulty.  Check labs.  Adjust meds prn   Obesity- ongoing.  Pt has gained weight during COVID but is aware of this and plans to be 'more vigilant' in her diet.  Encouraged regular exercise.  Check labs to risk stratify.  Will follow.  HA- new.  Suspect this was triggered by being out in the yard and possible exposure to pesticide.  She reports HAs are much less severe than they were initially and describes them currently as 'mild'.  Occur intermittently.  No associated visual changes, dizziness, N/V.  Encouraged her to keep a log to determine if there's a pattern or triggers.  Reviewed supportive care and red flags that should prompt return.  Pt expressed understanding and is in agreement w/ plan.    Annye Asa, MD 05/14/2019

## 2019-05-16 ENCOUNTER — Telehealth: Payer: Self-pay | Admitting: Family Medicine

## 2019-05-16 NOTE — Telephone Encounter (Signed)
LM asking pt to call back to schedule her cpe after 11/16/2019.

## 2019-05-17 ENCOUNTER — Ambulatory Visit: Payer: Medicare HMO

## 2019-05-20 ENCOUNTER — Encounter: Payer: Self-pay | Admitting: Family Medicine

## 2019-05-22 ENCOUNTER — Other Ambulatory Visit: Payer: Self-pay

## 2019-05-22 ENCOUNTER — Ambulatory Visit (INDEPENDENT_AMBULATORY_CARE_PROVIDER_SITE_OTHER): Payer: Medicare HMO

## 2019-05-22 DIAGNOSIS — E785 Hyperlipidemia, unspecified: Secondary | ICD-10-CM | POA: Diagnosis not present

## 2019-05-22 DIAGNOSIS — E669 Obesity, unspecified: Secondary | ICD-10-CM

## 2019-05-22 LAB — BASIC METABOLIC PANEL
BUN: 15 mg/dL (ref 6–23)
CO2: 27 mEq/L (ref 19–32)
Calcium: 9.4 mg/dL (ref 8.4–10.5)
Chloride: 104 mEq/L (ref 96–112)
Creatinine, Ser: 0.72 mg/dL (ref 0.40–1.20)
GFR: 80.68 mL/min (ref >60.00)
Glucose, Bld: 111 mg/dL — ABNORMAL HIGH (ref 70–99)
Potassium: 4.3 mEq/L (ref 3.5–5.1)
Sodium: 139 mEq/L (ref 135–145)

## 2019-05-22 LAB — CBC WITH DIFFERENTIAL/PLATELET
Basophils Absolute: 0.1 10*3/uL (ref 0.0–0.1)
Basophils Relative: 0.9 % (ref 0.0–3.0)
Eosinophils Absolute: 0.1 10*3/uL (ref 0.0–0.7)
Eosinophils Relative: 2 % (ref 0.0–5.0)
HCT: 44.1 % (ref 36.0–46.0)
Hemoglobin: 14.7 g/dL (ref 12.0–15.0)
Lymphocytes Relative: 27.9 % (ref 12.0–46.0)
Lymphs Abs: 1.7 10*3/uL (ref 0.7–4.0)
MCHC: 33.2 g/dL (ref 30.0–36.0)
MCV: 92.3 fl (ref 78.0–100.0)
Monocytes Absolute: 0.4 10*3/uL (ref 0.1–1.0)
Monocytes Relative: 6.5 % (ref 3.0–12.0)
Neutro Abs: 3.9 10*3/uL (ref 1.4–7.7)
Neutrophils Relative %: 62.7 % (ref 43.0–77.0)
Platelets: 268 10*3/uL (ref 150.0–400.0)
RBC: 4.78 Mil/uL (ref 3.87–5.11)
RDW: 14.9 % (ref 11.5–15.5)
WBC: 6.2 10*3/uL (ref 4.0–10.5)

## 2019-05-22 LAB — LIPID PANEL
Cholesterol: 151 mg/dL (ref 0–200)
HDL: 52.3 mg/dL (ref >39.00)
LDL Cholesterol: 80 mg/dL (ref 0–99)
NonHDL: 98.71
Total CHOL/HDL Ratio: 3
Triglycerides: 96 mg/dL (ref 0.0–149.0)
VLDL: 19.2 mg/dL (ref 0.0–40.0)

## 2019-05-22 LAB — HEPATIC FUNCTION PANEL
ALT: 34 U/L (ref 0–35)
AST: 28 U/L (ref 0–37)
Albumin: 4.5 g/dL (ref 3.5–5.2)
Alkaline Phosphatase: 90 U/L (ref 39–117)
Bilirubin, Direct: 0.1 mg/dL (ref 0.0–0.3)
Total Bilirubin: 0.7 mg/dL (ref 0.2–1.2)
Total Protein: 7.1 g/dL (ref 6.0–8.3)

## 2019-05-22 LAB — TSH: TSH: 2.05 u[IU]/mL (ref 0.35–4.50)

## 2019-05-31 ENCOUNTER — Encounter: Payer: Self-pay | Admitting: Family Medicine

## 2019-05-31 MED ORDER — OMEPRAZOLE 20 MG PO CPDR: 20.0000 mg | DELAYED_RELEASE_CAPSULE | Freq: Every day | ORAL | 1 refills | Status: DC

## 2019-05-31 MED ORDER — SIMVASTATIN 20 MG PO TABS: 20.0000 mg | ORAL_TABLET | Freq: Every day | ORAL | 1 refills | Status: DC

## 2019-06-18 DIAGNOSIS — L570 Actinic keratosis: Secondary | ICD-10-CM | POA: Diagnosis not present

## 2019-06-18 DIAGNOSIS — L638 Other alopecia areata: Secondary | ICD-10-CM | POA: Diagnosis not present

## 2019-06-18 DIAGNOSIS — Z85820 Personal history of malignant melanoma of skin: Secondary | ICD-10-CM | POA: Diagnosis not present

## 2019-06-18 DIAGNOSIS — L820 Inflamed seborrheic keratosis: Secondary | ICD-10-CM | POA: Diagnosis not present

## 2019-06-18 DIAGNOSIS — L821 Other seborrheic keratosis: Secondary | ICD-10-CM | POA: Diagnosis not present

## 2019-06-18 DIAGNOSIS — Z08 Encounter for follow-up examination after completed treatment for malignant neoplasm: Secondary | ICD-10-CM | POA: Diagnosis not present

## 2019-06-30 DIAGNOSIS — R197 Diarrhea, unspecified: Secondary | ICD-10-CM | POA: Diagnosis not present

## 2019-06-30 DIAGNOSIS — R51 Headache: Secondary | ICD-10-CM | POA: Diagnosis not present

## 2019-06-30 DIAGNOSIS — Z20828 Contact with and (suspected) exposure to other viral communicable diseases: Secondary | ICD-10-CM | POA: Diagnosis not present

## 2019-06-30 DIAGNOSIS — Z03818 Encounter for observation for suspected exposure to other biological agents ruled out: Secondary | ICD-10-CM | POA: Diagnosis not present

## 2019-06-30 DIAGNOSIS — R519 Headache, unspecified: Secondary | ICD-10-CM | POA: Insufficient documentation

## 2019-07-03 DIAGNOSIS — Z20828 Contact with and (suspected) exposure to other viral communicable diseases: Secondary | ICD-10-CM | POA: Insufficient documentation

## 2019-07-12 ENCOUNTER — Other Ambulatory Visit: Payer: Self-pay | Admitting: Family Medicine

## 2019-07-22 ENCOUNTER — Encounter: Payer: Self-pay | Admitting: Family Medicine

## 2019-07-22 DIAGNOSIS — R159 Full incontinence of feces: Secondary | ICD-10-CM

## 2019-08-06 ENCOUNTER — Other Ambulatory Visit: Payer: Self-pay

## 2019-08-06 ENCOUNTER — Encounter: Payer: Self-pay | Admitting: Gastroenterology

## 2019-08-06 ENCOUNTER — Ambulatory Visit (INDEPENDENT_AMBULATORY_CARE_PROVIDER_SITE_OTHER): Payer: Medicare HMO | Admitting: Gastroenterology

## 2019-08-06 VITALS — BP 144/74 | HR 81 | Temp 97.8°F | Ht 66.0 in | Wt 210.0 lb

## 2019-08-06 DIAGNOSIS — R151 Fecal smearing: Secondary | ICD-10-CM

## 2019-08-06 DIAGNOSIS — R140 Abdominal distension (gaseous): Secondary | ICD-10-CM | POA: Diagnosis not present

## 2019-08-06 NOTE — Patient Instructions (Signed)
If you are age 67 or older, your body mass index should be between 23-30. Your Body mass index is 33.89 kg/m. If this is out of the aforementioned range listed, please consider follow up with your Primary Care Provider.  If you are age 49 or younger, your body mass index should be between 19-25. Your Body mass index is 33.89 kg/m. If this is out of the aformentioned range listed, please consider follow up with your Primary Care Provider.   Start Probiotic such as Publishing copy, or Electronics engineer daily.  Start IBgard - take as needed.  You have been given samples.  Start Benefiber 2 tablespoons in 8 ounces of liquid daily.  Kegel exercises - see below  Call back in three weeks with an update.   Ask for Chong Sicilian, RN.  Thank you for choosing me and [facility].   [name] Bogus, PA-C   Kegel Exercises  Kegel exercises can help strengthen your pelvic floor muscles. The pelvic floor is a group of muscles that support your rectum, small intestine, and bladder. In females, pelvic floor muscles also help support the womb (uterus). These muscles help you control the flow of urine and stool. Kegel exercises are painless and simple, and they do not require any equipment. Your provider may suggest Kegel exercises to:  Improve bladder and bowel control.  Improve sexual response.  Improve weak pelvic floor muscles after surgery to remove the uterus (hysterectomy) or pregnancy (females).  Improve weak pelvic floor muscles after prostate gland removal or surgery (males). Kegel exercises involve squeezing your pelvic floor muscles, which are the same muscles you squeeze when you try to stop the flow of urine or keep from passing gas. The exercises can be done while sitting, standing, or lying down, but it is best to vary your position. Exercises How to do Kegel exercises: 1. Squeeze your pelvic floor muscles tight. You should feel a tight lift in your rectal area. If you are a female, you should also  feel a tightness in your vaginal area. Keep your stomach, buttocks, and legs relaxed. 2. Hold the muscles tight for up to 10 seconds. 3. Breathe normally. 4. Relax your muscles. 5. Repeat as told by your health care provider. Repeat this exercise daily as told by your health care provider. Continue to do this exercise for at least 4-6 weeks, or for as long as told by your health care provider. You may be referred to a physical therapist who can help you learn more about how to do Kegel exercises. Depending on your condition, your health care provider may recommend:  Varying how long you squeeze your muscles.  Doing several sets of exercises every day.  Doing exercises for several weeks.  Making Kegel exercises a part of your regular exercise routine. This information is not intended to replace advice given to you by your health care provider. Make sure you discuss any questions you have with your health care provider. Document Released: 09/06/2012 Document Revised: 05/10/2018 Document Reviewed: 05/10/2018 Elsevier Patient Education  2020 [employer].

## 2019-08-06 NOTE — Progress Notes (Signed)
08/06/2019 [name] BONHAM [id number] 1951-09-03   HISTORY OF PRESENT ILLNESS: This is a 68 year old female who is a patient of Dr. Lynne Leader.  She is here today with complaints of bloating and gas as well as stool leakage.  She says that few weeks ago she had an episode of severe abdominal pain and diarrhea.  The symptoms resolved quickly, but she has had a lot of gas and bloating as residual type symptoms.  She says that she had a small amount of bright red blood on the tissue paper and in the toilet bowl as well around the time that she had the episode of diarrhea, so she says she is sure that it was hemorrhoids.  She also reports that for the past 2 or 3 weeks now she has been having stool leakage into her underwear when walking, etc.  She says that each time she goes to urinate she notices that there is stool that needs to be cleaned and has to continuously/repeatedly wipe, also causing what she thinks is hemorrhoid bleeding.  Last colonoscopy was in June 2018 with Dr. Fuller Plan at which time she had a couple of polyps removed that were tubular adenomas and diverticulosis.  Repeat colonoscopy was recommended 5-year interval.    Past Medical History:  Diagnosis Date  . Allergy, unspecified not elsewhere classified   . Borderline hypertension   . Bronchitis   . Cataract   . DJD (degenerative joint disease)   . GERD (gastroesophageal reflux disease)   . Glaucoma   . History of skin cancer   . Hypercholesterolemia   . Hypothyroidism   . IBS (irritable bowel syndrome)   . OSA (obstructive sleep apnea) 12/27/2017  . Plantar fasciitis   . PONV (postoperative nausea and vomiting)   . Postoperative nausea and vomiting 09/02/2015  . Primary localized osteoarthritis of left knee   . Skin cancer (melanoma) (HCC)    basil cell   . Vitamin D deficiency    Past Surgical History:  Procedure Laterality Date  . BUNIONECTOMY  2004   left  . CATARACT EXTRACTION W/ INTRAOCULAR LENS  IMPLANT,  BILATERAL    . COLONOSCOPY    . FOOT OSTEOTOMY  2003   right  . HERNIA REPAIR  1957  . KNEE ARTHROSCOPY  1987&2003   left  . MELANOMA EXCISION Right    shoulder  . SHOULDER ARTHROSCOPY WITH OPEN ROTATOR CUFF REPAIR  2013   right  . SKIN SURGERY  4/05   Melanoma surg left shoulder  . TOENAIL EXCISION  10/04/2017  . TOTAL KNEE ARTHROPLASTY Left 09/01/2015   Procedure: TOTAL KNEE ARTHROPLASTY;  Surgeon: Elsie Saas, MD;  Location: [facility];  Service: Orthopedics;  Laterality: Left;  . TUBAL LIGATION  1990    reports that she quit smoking about 43 years ago. She quit after 6.00 years of use. She has never used smokeless tobacco. She reports current alcohol use of about 12.0 standard drinks of alcohol per week. She reports that she does not use drugs. family history includes Cancer in her father; Colon polyps in her father; Diabetes in her son; Heart disease in her father, maternal grandfather, and paternal grandfather; Hypertension in her brother; Osteoporosis in her sister. Allergies  Allergen Reactions  . Clindamycin/Lincomycin Diarrhea  . Flagyl [Metronidazole Hcl] Diarrhea    Stomach upset. Pt states she CANNOT TAKE oral, has no problem with topical.   . Augmentin [Amoxicillin-Pot Clavulanate] Diarrhea  . Telithromycin Diarrhea  Outpatient Encounter Medications as of 08/06/2019  Medication Sig  . aspirin 325 MG tablet Take 325 mg by mouth daily.  . brimonidine (ALPHAGAN) 0.2 % ophthalmic solution Place 1 drop into both eyes 3 (three) times daily.  . cetirizine (ZYRTEC) 10 MG tablet Take 10 mg by mouth daily.  . Cholecalciferol (VITAMIN D3) 5000 UNITS CAPS Take 5,000 Units by mouth daily.   . dorzolamide-timolol (COSOPT) 22.3-6.8 MG/ML ophthalmic solution Place 1 drop into both eyes 2 (two) times daily.  Marland Kitchen doxycycline (ADOXA) 50 MG tablet Take 50 mg by mouth daily. For rosacea  . Magnesium 200 MG TABS Take 200 mg by mouth daily.  Marland Kitchen MELATONIN PO Take 3 mg by mouth at bedtime as  needed and may repeat dose one time if needed.  . Multiple Vitamins-Minerals (MULTIVITAMIN PO) Take 1 tablet by mouth daily.  . NP THYROID 60 MG tablet TAKE 1 TABLET BY MOUTH ONCE DAILY BEFORE BREAKFAST  . omeprazole (PRILOSEC) 20 MG capsule Take 1 capsule (20 mg total) by mouth daily.  Marland Kitchen POTASSIUM PO Take by mouth.  . simvastatin (ZOCOR) 20 MG tablet Take 1 tablet (20 mg total) by mouth at bedtime.  Marland Kitchen VITAMIN K PO Take by mouth.  . [DISCONTINUED] Meloxicam 10 MG CAPS Take 1 tablet by mouth 2 (two) times daily as needed.  . [DISCONTINUED] neomycin-polymyxin-hydrocortisone (CORTISPORIN) 3.5-10000-1 OTIC suspension APPLY 1 TO 2 DROPS TO TOE AFTER SOAKING BID   No facility-administered encounter medications on file as of 08/06/2019.      REVIEW OF SYSTEMS  : All other systems reviewed and negative except where noted in the History of Present Illness.   PHYSICAL EXAM: BP (!) 144/74   Pulse 81   Temp 97.8 F (36.6 C)   Ht 5\' 6"  (1.676 m) Comment: pt supplied  Wt 210 lb (95.3 kg)   BMI 33.89 kg/m  General: Well developed white female in no acute distress Head: Normocephalic and atraumatic Eyes:  Sclerae anicteric, conjunctiva pink. Ears: Normal auditory acuity Lungs: Clear throughout to auscultation; no increased WOB. Heart: Regular rate and rhythm; no M/R/G. Abdomen: Soft, non-distended.  BS present.  Non-tender. Rectal:  Somewhat decreased resting sphincter tone and squeeze.  DRE did not reveal any masses.  Light brown stool on exam glove. Musculoskeletal: Symmetrical with no gross deformities  Skin: No lesions on visible extremities Extremities: No edema  Neurological: Alert oriented x 4, grossly non-focal Psychological:  Alert and cooperative. Normal mood and affect  ASSESSMENT AND PLAN: *Bloating: Nonspecific.  Could be some post-infectious IBS as she had an episode of severe abdominal pain and diarrhea a few weeks ago before this started.  We will start a daily probiotic such  as Florastor or align.  Also gave samples of IBgard for her to try.  This is available over-the-counter also. *Fecal leakage: Likely due to decreased sphincter tone/pelvic muscle weakness.  We discussed Kegel exercises and literature was given.  We also discussed potential referral to pelvic floor physical therapy.  Have also recommended that she begin taking powder fiber supplement such as Benefiber 2 tablespoons in 8 ounces of liquid daily to help bulk the stools.  **She will call back in 3 weeks to give [place] an update on her symptoms.   CC:  Midge Minium, MD

## 2019-08-22 ENCOUNTER — Encounter: Payer: Self-pay | Admitting: Gastroenterology

## 2019-08-22 DIAGNOSIS — R151 Fecal smearing: Secondary | ICD-10-CM | POA: Insufficient documentation

## 2019-08-22 DIAGNOSIS — R140 Abdominal distension (gaseous): Secondary | ICD-10-CM | POA: Insufficient documentation

## 2019-08-23 NOTE — Progress Notes (Signed)
Reviewed and agree with management plan.  Amal Saiki T. Alekzander Cardell, MD FACG [facility]  

## 2019-09-20 ENCOUNTER — Other Ambulatory Visit: Payer: Self-pay | Admitting: Family Medicine

## 2019-10-17 DIAGNOSIS — L570 Actinic keratosis: Secondary | ICD-10-CM | POA: Diagnosis not present

## 2019-10-17 DIAGNOSIS — L905 Scar conditions and fibrosis of skin: Secondary | ICD-10-CM | POA: Diagnosis not present

## 2019-10-17 DIAGNOSIS — L820 Inflamed seborrheic keratosis: Secondary | ICD-10-CM | POA: Diagnosis not present

## 2019-10-17 DIAGNOSIS — C44529 Squamous cell carcinoma of skin of other part of trunk: Secondary | ICD-10-CM | POA: Diagnosis not present

## 2019-10-26 ENCOUNTER — Ambulatory Visit: Payer: Medicare HMO | Attending: Internal Medicine

## 2019-10-26 DIAGNOSIS — Z23 Encounter for immunization: Secondary | ICD-10-CM | POA: Insufficient documentation

## 2019-10-26 NOTE — Progress Notes (Signed)
   Covid-19 Vaccination Clinic  Name:  [name] [name]    MRN: [id number] DOB: November 19, 1950  10/26/2019  Ms. [name] was observed post Covid-19 immunization for 15 minutes without incidence. She was provided with Vaccine Information Sheet and instruction to access the V-Safe system.   Ms. [name] was instructed to call 911 with any severe reactions post vaccine: Marland Kitchen Difficulty breathing  . Swelling of your face and throat  . A fast heartbeat  . A bad rash all over your body  . Dizziness and weakness    Immunizations Administered    Name Date Dose VIS Date Route   Pfizer COVID-19 Vaccine 10/26/2019  4:46 PM 0.3 mL 09/14/2019 Intramuscular   Manufacturer: [place]   Lot: [id number]   [place]: [id number]

## 2019-10-30 DIAGNOSIS — H18413 Arcus senilis, bilateral: Secondary | ICD-10-CM | POA: Diagnosis not present

## 2019-10-30 DIAGNOSIS — H43813 Vitreous degeneration, bilateral: Secondary | ICD-10-CM | POA: Diagnosis not present

## 2019-10-30 DIAGNOSIS — H02834 Dermatochalasis of left upper eyelid: Secondary | ICD-10-CM | POA: Diagnosis not present

## 2019-10-30 DIAGNOSIS — H401132 Primary open-angle glaucoma, bilateral, moderate stage: Secondary | ICD-10-CM | POA: Diagnosis not present

## 2019-11-02 ENCOUNTER — Other Ambulatory Visit: Payer: Self-pay

## 2019-11-02 ENCOUNTER — Other Ambulatory Visit: Payer: Medicare HMO

## 2019-11-02 ENCOUNTER — Ambulatory Visit: Payer: Medicare HMO | Admitting: Gastroenterology

## 2019-11-02 ENCOUNTER — Encounter: Payer: Self-pay | Admitting: Gastroenterology

## 2019-11-02 VITALS — BP 150/92 | HR 88 | Temp 98.0°F | Ht 66.0 in | Wt 208.4 lb

## 2019-11-02 DIAGNOSIS — Z86010 Personal history of colonic polyps: Secondary | ICD-10-CM

## 2019-11-02 DIAGNOSIS — R197 Diarrhea, unspecified: Secondary | ICD-10-CM

## 2019-11-02 DIAGNOSIS — R151 Fecal smearing: Secondary | ICD-10-CM

## 2019-11-02 NOTE — Patient Instructions (Addendum)
Your provider has requested that you go to the basement level for lab work before leaving today. Press "B" on the elevator. The lab is located at the first door on the left as you exit the elevator.  After turning stool specimen back in stop taking your magnesium x 5 days to see if this improves your symptoms. If no improvement then stop doxycycline x 5 days.   Start doing kegel exercises 5 x a day.   Due to recent COVID-19 restrictions implemented by our local and state authorities and in an effort to keep both patients and staff as safe as possible, our hospital system now requires COVID-19 testing prior to any scheduled hospital procedure. Please go to our [facility] location drive thru testing site ([address] [place], [place] [address]) on 11/13/19 at  12pm. There will be multiple testing areas, the first checkpoint being for pre-procedure/surgery testing. Get into the right (yellow) lane that leads to the PAT testing team. You will not be billed at the time of testing but may receive a bill later depending on your insurance. The approximate cost of the test is $100. You must agree to quarantine from the time of your testing until the procedure date on 11/16/19 . This should include staying at home with ONLY the people you live with. Avoid take-out, grocery store shopping or leaving the house for any non-emergent reason. Failure to have your COVID-19 test done on the date and time you have been scheduled will result in cancellation of procedure. Please call our office at [phone] if you have any questions.    You have been scheduled to have an anorectal manometry at [facility] on 11/16/19 at 8:30am. Please arrive 30 minutes prior to your appointment time for registration (1st floor of the hospital-admissions).  Please make certain to use 1 Fleets enema 2 hours prior to coming for your appointment. You can purchase Fleets enemas from the laxative section at your drug store. You  should not eat anything during the two hours prior to the procedure. You may take regular medications with small sips of water at least 2 hours prior to the study.  Anorectal manometry is a test performed to evaluate patients with constipation or fecal incontinence. This test measures the pressures of the anal sphincter muscles, the sensation in the rectum, and the neural reflexes that are needed for normal bowel movements.  THE PROCEDURE The test takes approximately 30 minutes to 1 hour. You will be asked to change into a hospital gown. A technician or nurse will explain the procedure to you, take a brief health history, and answer any questions you may have. The patient then lies on his or her left side. A small, flexible tube, about the size of a thermometer, with a balloon at the end is inserted into the rectum. The catheter is connected to a machine that measures the pressure. During the test, the small balloon attached to the catheter may be inflated in the rectum to assess the normal reflex pathways. The nurse or technician may also ask the person to squeeze, relax, and push at various times. The anal sphincter muscle pressures are measured during each of these maneuvers. To squeeze, the patient tightens the sphincter muscles as if trying to prevent anything from coming out. To push or bear down, the patient strains down as if trying to have a bowel movement.  Thank you for choosing me and [facility].  Pricilla Riffle. Dagoberto Ligas., MD., [facility] Regal

## 2019-11-02 NOTE — Addendum Note (Signed)
Addended by: Marzella Schlein on: 11/02/2019 03:02 PM   Modules accepted: Orders

## 2019-11-02 NOTE — Progress Notes (Signed)
    History of Present Illness: This is a 69 year old female returning for fecal leakage, diarrhea and abdominal bloating.  GI office visit on November 2 with Alonza Bogus, PA-C.  Postinfectious GI bloating, diarrhea and decreased sphincter tone were felt to be the primary problems.  She was advised to start a probiotic, Benefiber and begin IBgard prn.  Kegel exercises were recommended however she has only done them sporadically.  She notes her stools have been more formed but are still not well formed with persistent fecal soiling and incontinence.   Current Medications, Allergies, Past Medical History, Past Surgical History, Family History and Social History were reviewed in [employer] record.   Physical Exam: General: Well developed, well nourished, no acute distress Head: Normocephalic and atraumatic Eyes:  sclerae anicteric, EOMI Ears: Normal auditory acuity Pulses:  Normal pulses noted Extremities: No clubbing, cyanosis, edema or deformities noted Neurological: Alert oriented x 4, grossly nonfocal Psychological:  Alert and cooperative. Normal mood and affect   Assessment and Recommendations:  1. Diarrhea, fecal soiling, fecal incontinence.  GI pathogen panel.  5-day trial of holding magnesium, then 5-day trial of holding doxycycline and assess response.  If problems still persist consider adding Imodium once or twice daily.  Continue Benefiber daily.  Continue probiotic daily.  Kegel exercises 5 times per day for 5 minutes at a time.  Schedule anorectal manometry.  2.  Personal history of adenomatous colon polyps.  Surveillance colonoscopy recommended in June 2023.

## 2019-11-13 ENCOUNTER — Ambulatory Visit: Payer: Medicare HMO

## 2019-11-13 ENCOUNTER — Other Ambulatory Visit
Admission: RE | Admit: 2019-11-13 | Discharge: 2019-11-13 | Disposition: A | Discharge status: Home / Self Care | Payer: Medicare HMO | Source: Ambulatory Visit | Attending: Gastroenterology | Admitting: Gastroenterology

## 2019-11-13 ENCOUNTER — Other Ambulatory Visit: Payer: Medicare HMO

## 2019-11-13 DIAGNOSIS — R197 Diarrhea, unspecified: Secondary | ICD-10-CM | POA: Diagnosis not present

## 2019-11-13 DIAGNOSIS — R151 Fecal smearing: Secondary | ICD-10-CM

## 2019-11-13 DIAGNOSIS — Z20822 Contact with and (suspected) exposure to covid-19: Secondary | ICD-10-CM | POA: Diagnosis not present

## 2019-11-13 DIAGNOSIS — Z01812 Encounter for preprocedural laboratory examination: Secondary | ICD-10-CM | POA: Diagnosis not present

## 2019-11-13 DIAGNOSIS — Z23 Encounter for immunization: Secondary | ICD-10-CM

## 2019-11-13 LAB — SARS CORONAVIRUS 2 (TAT 6-24 HRS): SARS Coronavirus 2: NEGATIVE

## 2019-11-13 NOTE — Progress Notes (Signed)
   Covid-19 Vaccination Clinic  Name:  [name] [name]    MRN: [id number] DOB: 1951/01/18  11/13/2019  [name] was observed post Covid-19 immunization for 15 minutes without incidence. She was provided with Vaccine Information Sheet and instruction to access the V-Safe system.   [name] was instructed to call 911 with any severe reactions post vaccine: Marland Kitchen Difficulty breathing  . Swelling of your face and throat  . A fast heartbeat  . A bad rash all over your body  . Dizziness and weakness    Immunizations Administered    Name Date Dose VIS Date Route   Pfizer COVID-19 Vaccine 11/13/2019 10:28 AM 0.3 mL 09/14/2019 Intramuscular   Manufacturer: [place]   Lot: [id number]   [place]: [id number]

## 2019-11-15 LAB — GASTROINTESTINAL PATHOGEN PANEL PCR
C. difficile Tox A/B, PCR: NOT DETECTED
Campylobacter, PCR: NOT DETECTED
Cryptosporidium, PCR: NOT DETECTED
E coli (ETEC) LT/ST PCR: NOT DETECTED
E coli (STEC) stx1/stx2, PCR: NOT DETECTED
E coli 0157, PCR: NOT DETECTED
Giardia lamblia, PCR: NOT DETECTED
Norovirus, PCR: NOT DETECTED
Rotavirus A, PCR: NOT DETECTED
Salmonella, PCR: NOT DETECTED
Shigella, PCR: NOT DETECTED

## 2019-11-16 ENCOUNTER — Encounter (HOSPITAL_COMMUNITY)
Admission: RE | Disposition: A | Discharge status: Home / Self Care | Payer: Self-pay | Source: Home / Self Care | Attending: Gastroenterology

## 2019-11-16 ENCOUNTER — Ambulatory Visit (HOSPITAL_COMMUNITY)
Admission: RE | Admit: 2019-11-16 | Discharge: 2019-11-16 | Disposition: A | Discharge status: Home / Self Care | Payer: Medicare HMO | Attending: Gastroenterology | Admitting: Gastroenterology

## 2019-11-16 DIAGNOSIS — K6289 Other specified diseases of anus and rectum: Secondary | ICD-10-CM | POA: Insufficient documentation

## 2019-11-16 DIAGNOSIS — R159 Full incontinence of feces: Secondary | ICD-10-CM | POA: Insufficient documentation

## 2019-11-16 HISTORY — PX: ANAL RECTAL MANOMETRY: SHX6358

## 2019-11-16 SURGERY — MANOMETRY, ANORECTAL

## 2019-11-16 NOTE — Progress Notes (Addendum)
3D anorectal manometry performed per protocol.  Patient tolerated well.  Balloon expulsion test then done with 50 cc balloon expelled in 5 seconds.  Report sent to Dr Harl Bowie.

## 2019-11-20 ENCOUNTER — Other Ambulatory Visit: Payer: Self-pay

## 2019-11-20 ENCOUNTER — Encounter: Payer: Self-pay | Admitting: Family Medicine

## 2019-11-20 ENCOUNTER — Ambulatory Visit (INDEPENDENT_AMBULATORY_CARE_PROVIDER_SITE_OTHER): Payer: Medicare HMO | Admitting: Family Medicine

## 2019-11-20 VITALS — BP 132/83 | HR 81 | Temp 97.9°F | Resp 16 | Ht 66.0 in | Wt 208.1 lb

## 2019-11-20 DIAGNOSIS — E785 Hyperlipidemia, unspecified: Secondary | ICD-10-CM

## 2019-11-20 DIAGNOSIS — Z0000 Encounter for general adult medical examination without abnormal findings: Secondary | ICD-10-CM | POA: Diagnosis not present

## 2019-11-20 DIAGNOSIS — E559 Vitamin D deficiency, unspecified: Secondary | ICD-10-CM | POA: Diagnosis not present

## 2019-11-20 DIAGNOSIS — E669 Obesity, unspecified: Secondary | ICD-10-CM | POA: Diagnosis not present

## 2019-11-20 DIAGNOSIS — E038 Other specified hypothyroidism: Secondary | ICD-10-CM

## 2019-11-20 LAB — CBC WITH DIFFERENTIAL/PLATELET
Basophils Absolute: 0.1 10*3/uL (ref 0.0–0.1)
Basophils Relative: 0.9 % (ref 0.0–3.0)
Eosinophils Absolute: 0.1 10*3/uL (ref 0.0–0.7)
Eosinophils Relative: 2.2 % (ref 0.0–5.0)
HCT: 43.8 % (ref 36.0–46.0)
Hemoglobin: 14.6 g/dL (ref 12.0–15.0)
Lymphocytes Relative: 37 % (ref 12.0–46.0)
Lymphs Abs: 2.2 10*3/uL (ref 0.7–4.0)
MCHC: 33.2 g/dL (ref 30.0–36.0)
MCV: 92.7 fl (ref 78.0–100.0)
Monocytes Absolute: 0.4 10*3/uL (ref 0.1–1.0)
Monocytes Relative: 6.9 % (ref 3.0–12.0)
Neutro Abs: 3.1 10*3/uL (ref 1.4–7.7)
Neutrophils Relative %: 53 % (ref 43.0–77.0)
Platelets: 265 10*3/uL (ref 150.0–400.0)
RBC: 4.73 Mil/uL (ref 3.87–5.11)
RDW: 14.3 % (ref 11.5–15.5)
WBC: 5.8 10*3/uL (ref 4.0–10.5)

## 2019-11-20 LAB — HEPATIC FUNCTION PANEL
ALT: 31 U/L (ref 0–35)
AST: 23 U/L (ref 0–37)
Albumin: 4.4 g/dL (ref 3.5–5.2)
Alkaline Phosphatase: 90 U/L (ref 39–117)
Bilirubin, Direct: 0.1 mg/dL (ref 0.0–0.3)
Total Bilirubin: 0.5 mg/dL (ref 0.2–1.2)
Total Protein: 6.9 g/dL (ref 6.0–8.3)

## 2019-11-20 LAB — BASIC METABOLIC PANEL
BUN: 17 mg/dL (ref 6–23)
CO2: 31 mEq/L (ref 19–32)
Calcium: 9.6 mg/dL (ref 8.4–10.5)
Chloride: 103 mEq/L (ref 96–112)
Creatinine, Ser: 0.76 mg/dL (ref 0.40–1.20)
GFR: 75.68 mL/min (ref >60.00)
Glucose, Bld: 99 mg/dL (ref 70–99)
Potassium: 5 mEq/L (ref 3.5–5.1)
Sodium: 140 mEq/L (ref 135–145)

## 2019-11-20 LAB — LIPID PANEL
Cholesterol: 152 mg/dL (ref 0–200)
HDL: 52.5 mg/dL (ref >39.00)
LDL Cholesterol: 81 mg/dL (ref 0–99)
NonHDL: 99.4
Total CHOL/HDL Ratio: 3
Triglycerides: 91 mg/dL (ref 0.0–149.0)
VLDL: 18.2 mg/dL (ref 0.0–40.0)

## 2019-11-20 LAB — VITAMIN D 25 HYDROXY (VIT D DEFICIENCY, FRACTURES): VITD: 44.1 ng/mL (ref 30.00–100.00)

## 2019-11-20 LAB — TSH: TSH: 1.01 u[IU]/mL (ref 0.35–4.50)

## 2019-11-20 NOTE — Progress Notes (Signed)
Subjective:    Patient ID: [name] [name], female    DOB: [birth year], 69 y.o.   MRN: [id number]  HPI Here today for CPE and MWV.  Risk Factors: Hypothyroid- chronic problem, on NP thyroid daily Hyperlipidemia- chronic problem, on Simvastatin 20mg  daily Obesity- chronic problem, weight is stable.  BMI is 33.59 Vit D Def- pt has hx of this Physical Activity: walking regularly Fall Risk: low Depression: denies current sxs Hearing: normal to conversational tones, mildly decreased to whispered voice ADL's: independent Cognitive: normal linear thought process, memory and attention intact Home Safety: safe at home, lives w/ husband Height, Weight, BMI, Visual Acuity: see vitals, vision corrected to 20/20 w/ cataract surgery Counseling: UTD on colonoscopy, DEXA, due for mammo.  UTD on flu, COVID Labs Ordered: See A&P Care Plan: See A&P   Health Maintenance  Topic Date Due  . MAMMOGRAM  11/20/2019  . PNA vac Low Risk Adult (2 of 2 - PPSV23) 11/19/2020 (Originally 05/18/2019)  . TETANUS/TDAP  09/30/2021  . COLONOSCOPY  03/29/2022  . INFLUENZA VACCINE  Completed  . DEXA SCAN  Completed  . Hepatitis C Screening  Completed     Patient Care Team    Relationship Specialty Notifications Start End  Midge Minium, MD PCP - General Family Medicine  11/22/14   Roque Cash., MD Consulting Physician Obstetrics and Gynecology  05/29/15   Hollar, Katharine Look, MD Referring Physician Dermatology  05/29/15   Elsie Saas, MD Consulting Physician Orthopedic Surgery  05/29/15   Wallene Huh, [place] Consulting Physician Podiatry  05/29/15   Janann August, MD Referring Physician Dermatology  11/09/17   Darleen Crocker, MD Consulting Physician Ophthalmology  11/15/18       Review of Systems Patient reports no vision/ hearing changes, adenopathy,fever, weight change,  persistant/recurrent hoarseness , swallowing issues, chest pain, palpitations, edema, persistant/recurrent cough, hemoptysis,  dyspnea (rest/exertional/paroxysmal nocturnal), gastrointestinal bleeding (melena, rectal bleeding), abdominal pain, significant heartburn, GU symptoms (dysuria, hematuria, incontinence), Gyn symptoms (abnormal  bleeding, pain),  syncope, focal weakness, memory loss, numbness & tingling, skin/hair/nail changes, abnormal bruising or bleeding, anxiety, or depression.   + fecal leakage  This visit occurred during the SARS-CoV-2 public health emergency.  Safety protocols were in place, including screening questions prior to the visit, additional usage of staff PPE, and extensive cleaning of exam room while observing appropriate contact time as indicated for disinfecting solutions.       Objective:   Physical Exam General Appearance:    Alert, cooperative, no distress, appears stated age, obese  Head:    Normocephalic, without obvious abnormality, atraumatic  Eyes:    PERRL, conjunctiva/corneas clear, EOM's intact, fundi    benign, both eyes  Ears:    Normal TM's and external ear canals, both ears  Nose:   Deferred due to COVID  Throat:   Neck:   Supple, symmetrical, trachea midline, no adenopathy;    Thyroid: no enlargement/tenderness/nodules  Back:     Symmetric, no curvature, ROM normal, no CVA tenderness  Lungs:     Clear to auscultation bilaterally, respirations unlabored  Chest Wall:    No tenderness or deformity   Heart:    Regular rate and rhythm, S1 and S2 normal, no murmur, rub   or gallop  Breast Exam:    Deferred to GYN  Abdomen:     Soft, non-tender, bowel sounds active all four quadrants,    no masses, no organomegaly  Genitalia:    Deferred to GYN  Rectal:  Extremities:   Extremities normal, atraumatic, no cyanosis or edema  Pulses:   2+ and symmetric all extremities  Skin:   Skin color, texture, turgor normal, no rashes or lesions  Lymph nodes:   Cervical, supraclavicular, and axillary nodes normal  Neurologic:   CNII-XII intact, normal strength, sensation and reflexes     throughout          Assessment & Plan:

## 2019-11-20 NOTE — Assessment & Plan Note (Signed)
Pt's PE WNL w/ exception of obesity.  UTD on colonoscopy, immunizations.  Due for mammo- pt to schedule.  Check labs.  Discussed healthy diet, regular exercise, COVID safety.

## 2019-11-20 NOTE — Assessment & Plan Note (Signed)
Pt has hx of this.  Check labs and replete prn. 

## 2019-11-20 NOTE — Assessment & Plan Note (Signed)
Chronic problem, tolerating statin w/o difficulty.  Encouraged healthy diet and regular exercise.  Check labs.  Adjust meds prn  

## 2019-11-20 NOTE — Assessment & Plan Note (Signed)
Ongoing issue for pt.  Applauded her efforts at regular exercise and encouraged healthy diet.  Check labs to risk stratify.  Will follow.

## 2019-11-20 NOTE — Patient Instructions (Addendum)
Follow up in 6 months to recheck cholesterol We'll notify you of your lab results and make any changes if needed Keep up the good work on healthy diet and regular exercise- you can do it! Call and schedule your mammogram at your convenience Call with any questions or concerns Stay Safe!  Stay Healthy! HAPPY EARLY BIRTHDAY!!!   Preventive Care 68 Years and Older, Female Preventive care refers to lifestyle choices and visits with your health care provider that can promote health and wellness. This includes:  A yearly physical exam. This is also called an annual well check.  Regular dental and eye exams.  Immunizations.  Screening for certain conditions.  Healthy lifestyle choices, such as diet and exercise. What can I expect for my preventive care visit? Physical exam Your health care provider will check:  Height and weight. These may be used to calculate body mass index (BMI), which is a measurement that tells if you are at a healthy weight.  Heart rate and blood pressure.  Your skin for abnormal spots. Counseling Your health care provider may ask you questions about:  Alcohol, tobacco, and drug use.  Emotional well-being.  Home and relationship well-being.  Sexual activity.  Eating habits.  History of falls.  Memory and ability to understand (cognition).  Work and work Statistician.  Pregnancy and menstrual history. What immunizations do I need?  Influenza (flu) vaccine  This is recommended every year. Tetanus, diphtheria, and pertussis (Tdap) vaccine  You may need a Td booster every 10 years. Varicella (chickenpox) vaccine  You may need this vaccine if you have not already been vaccinated. Zoster (shingles) vaccine  You may need this after age 71. Pneumococcal conjugate (PCV13) vaccine  One dose is recommended after age 72. Pneumococcal polysaccharide (PPSV23) vaccine  One dose is recommended after age 84. Measles, mumps, and rubella (MMR)  vaccine  You may need at least one dose of MMR if you were born in 1957 or later. You may also need a second dose. Meningococcal conjugate (MenACWY) vaccine  You may need this if you have certain conditions. Hepatitis A vaccine  You may need this if you have certain conditions or if you travel or work in places where you may be exposed to hepatitis A. Hepatitis B vaccine  You may need this if you have certain conditions or if you travel or work in places where you may be exposed to hepatitis B. Haemophilus influenzae type b (Hib) vaccine  You may need this if you have certain conditions. You may receive vaccines as individual doses or as more than one vaccine together in one shot (combination vaccines). Talk with your health care provider about the risks and benefits of combination vaccines. What tests do I need? Blood tests  Lipid and cholesterol levels. These may be checked every 5 years, or more frequently depending on your overall health.  Hepatitis C test.  Hepatitis B test. Screening  Lung cancer screening. You may have this screening every year starting at age 97 if you have a 30-pack-year history of smoking and currently smoke or have quit within the past 15 years.  Colorectal cancer screening. All adults should have this screening starting at age 23 and continuing until age 46. Your health care provider may recommend screening at age 53 if you are at increased risk. You will have tests every 1-10 years, depending on your results and the type of screening test.  Diabetes screening. This is done by checking your blood sugar (glucose) after  you have not eaten for a while (fasting). You may have this done every 1-3 years.  Mammogram. This may be done every 1-2 years. Talk with your health care provider about how often you should have regular mammograms.  BRCA-related cancer screening. This may be done if you have a family history of breast, ovarian, tubal, or peritoneal  cancers. Other tests  Sexually transmitted disease (STD) testing.  Bone density scan. This is done to screen for osteoporosis. You may have this done starting at age 59. Follow these instructions at home: Eating and drinking  Eat a diet that includes fresh fruits and vegetables, whole grains, lean protein, and low-fat dairy products. Limit your intake of foods with high amounts of sugar, saturated fats, and salt.  Take vitamin and mineral supplements as recommended by your health care provider.  Do not drink alcohol if your health care provider tells you not to drink.  If you drink alcohol: ? Limit how much you have to 0-1 drink a day. ? Be aware of how much alcohol is in your drink. In the [place] one drink equals one 12 oz bottle of beer (355 mL), one 5 oz glass of wine (148 mL), or one 1 oz glass of hard liquor (44 mL). Lifestyle  Take daily care of your teeth and gums.  Stay active. Exercise for at least 30 minutes on 5 or more days each week.  Do not use any products that contain nicotine or tobacco, such as cigarettes, e-cigarettes, and chewing tobacco. If you need help quitting, ask your health care provider.  If you are sexually active, practice safe sex. Use a condom or other form of protection in order to prevent STIs (sexually transmitted infections).  Talk with your health care provider about taking a low-dose aspirin or statin. What's next?  Go to your health care provider once a year for a well check visit.  Ask your health care provider how often you should have your eyes and teeth checked.  Stay up to date on all vaccines. This information is not intended to replace advice given to you by your health care provider. Make sure you discuss any questions you have with your health care provider. Document Revised: 09/14/2018 Document Reviewed: 09/14/2018 Elsevier Patient Education  2020 [employer].

## 2019-11-20 NOTE — Assessment & Plan Note (Signed)
Chronic problem.  Currently asymptomatic.  Check labs.  Adjust meds prn  

## 2019-11-21 ENCOUNTER — Telehealth: Payer: Self-pay | Admitting: General Practice

## 2019-11-21 NOTE — Telephone Encounter (Signed)
Pt will advise if symptoms worsen.

## 2019-11-21 NOTE — Telephone Encounter (Signed)
I would recommend pt be tested for COVID if symptoms continue

## 2019-11-21 NOTE — Telephone Encounter (Signed)
Called to give pt her lab results. She advised that she is not feeling well. She woke up in the middle of the night sneezing/coughing and has had diarrhea at least 3 times this morning alone.   Pt is going to try OTC medications but is going to let you know if it worsens.

## 2019-11-23 DIAGNOSIS — R159 Full incontinence of feces: Secondary | ICD-10-CM

## 2019-11-29 ENCOUNTER — Other Ambulatory Visit: Payer: Self-pay

## 2019-11-29 DIAGNOSIS — R151 Fecal smearing: Secondary | ICD-10-CM

## 2019-11-29 DIAGNOSIS — K5902 Outlet dysfunction constipation: Secondary | ICD-10-CM

## 2019-11-29 DIAGNOSIS — K6289 Other specified diseases of anus and rectum: Secondary | ICD-10-CM

## 2019-12-12 ENCOUNTER — Ambulatory Visit: Payer: Medicare HMO | Attending: Gastroenterology | Admitting: Physical Therapy

## 2019-12-12 ENCOUNTER — Encounter: Payer: Self-pay | Admitting: Physical Therapy

## 2019-12-12 ENCOUNTER — Other Ambulatory Visit: Payer: Self-pay

## 2019-12-12 DIAGNOSIS — R279 Unspecified lack of coordination: Secondary | ICD-10-CM

## 2019-12-12 DIAGNOSIS — M6281 Muscle weakness (generalized): Secondary | ICD-10-CM | POA: Diagnosis not present

## 2019-12-12 NOTE — Therapy (Signed)
[facility] [address] [place], [place], [address] Phone: [phone]   Fax:  [phone]  Physical Therapy Evaluation  Patient Details  Name: [name] [name] MRN: [id number] Date of Birth: 26-Mar-1951 Referring Provider (PT): Annye Asa, MD   Encounter Date: 12/12/2019  PT End of Session - 12/12/19 1222    Visit Number  1    Date for PT Re-Evaluation  03/05/20    PT Start Time  1222    PT Stop Time  1307    PT Time Calculation (min)  45 min    Activity Tolerance  Patient tolerated treatment well    Behavior During Therapy  [facility] for tasks assessed/performed       Past Medical History:  Diagnosis Date  . Allergy, unspecified not elsewhere classified   . Borderline hypertension   . Bronchitis   . Cataract   . DJD (degenerative joint disease)   . GERD (gastroesophageal reflux disease)   . Glaucoma   . History of skin cancer   . Hypercholesterolemia   . Hypothyroidism   . IBS (irritable bowel syndrome)   . OSA (obstructive sleep apnea) 12/27/2017  . Plantar fasciitis   . PONV (postoperative nausea and vomiting)   . Postoperative nausea and vomiting 09/02/2015  . Primary localized osteoarthritis of left knee   . Skin cancer (melanoma) (HCC)    basil cell   . Vitamin D deficiency     Past Surgical History:  Procedure Laterality Date  . ANAL RECTAL MANOMETRY N/A 11/16/2019   Procedure: ANO RECTAL MANOMETRY;  Surgeon: Mauri Pole, MD;  Location: [facility];  Service: Endoscopy;  Laterality: N/A;  . BUNIONECTOMY  2004   left  . CATARACT EXTRACTION W/ INTRAOCULAR LENS  IMPLANT, BILATERAL    . COLONOSCOPY    . FOOT OSTEOTOMY  2003   right  . HERNIA REPAIR  1957  . KNEE ARTHROSCOPY  1987&2003   left  . MELANOMA EXCISION Right    shoulder  . SHOULDER ARTHROSCOPY WITH OPEN ROTATOR CUFF REPAIR  2013   right  . SKIN SURGERY  4/05   Melanoma surg left shoulder  . TOENAIL EXCISION   10/04/2017  . TOTAL KNEE ARTHROPLASTY Left 09/01/2015   Procedure: TOTAL KNEE ARTHROPLASTY;  Surgeon: Elsie Saas, MD;  Location: [place];  Service: Orthopedics;  Laterality: Left;  . TUBAL LIGATION  1990    There were no vitals filed for this visit.   Subjective Assessment - 12/12/19 1227    Subjective  In the fall started having diarhea but has managed that with food.  Now just wiping a lot and there always something there about 30% of the time when I have to void or have a BM.  About 30% of the time there is smearing on underwear. Usually was having a BM 1x/day now it is usually 3x/day.    Patient Stated Goals  Not have any smearing    Currently in Pain?  No/denies         [facility] PT Assessment - 12/12/19 0001      Assessment   Medical Diagnosis  R15.1 (ICD-10-CM) - Fecal soiling;K59.02 (ICD-10-CM) - Dyssynergic defecation;K62.89 (ICD-10-CM) - Relaxation of internal anal sphincter    Referring Provider (PT)  Annye Asa, MD    Prior Therapy  No      Precautions   Precautions  None      Restrictions   Weight Bearing Restrictions  No  Balance Screen   Has the patient fallen in the past 6 months  No      [place] residence    Living Arrangements  Spouse/significant other      Prior Function   Level of [place]  Retired    Art therapist, reading, going to the [employer]    Overall Cognitive Status  Within Functional Limits for tasks assessed      Posture/Postural Control   Posture/Postural Control  Postural limitations    Postural Limitations  Increased lumbar lordosis;Anterior pelvic tilt      ROM / Strength   AROM / PROM / Strength  Strength;PROM      PROM   Overall PROM Comments  Rt hip IR and flexion mild stiffness      Strength   Overall Strength Comments  core decreased tone      Flexibility   Soft Tissue Assessment /Muscle Length  yes    Hamstrings  90 deg       Palpation   Palpation comment  Rt glutes and lumbar paraspinals tight      Ambulation/Gait   Gait Pattern  Within Functional Limits                Objective measurements completed on examination: See above findings.    Pelvic Floor Special Questions - 12/12/19 0001    Prior Pelvic/Prostate Exam  Yes    Prior Pregnancies  Yes    Number of Pregnancies  4   2 miscarriages   Number of Vaginal Deliveries  2    Currently Sexually Active  Yes    Urinary Leakage  No    Fecal incontinence  Yes    Falling out feeling (prolapse)  No    Skin Integrity  Intact    Perineal Body/Introitus   Descended    External Palpation  normal    Prolapse  Anterior Wall   looks very mild   Pelvic Floor Internal Exam  pt identity confirmed and consent to perform internal assessment was given    Exam Type  Rectal    Strength  weak squeeze, no lift    Strength # of seconds  5    Tone  normal to high       [facility] Adult PT Treatment/Exercise - 12/12/19 0001      Self-Care   Self-Care  Other Self-Care Comments    Other Self-Care Comments   toileting techniques and TC for pelvic contraction             PT Education - 12/12/19 1332    Education Details  toileting techniques and sidelying contract with TC    Person(s) Educated  Patient    Methods  Explanation;Demonstration;Handout;Verbal cues    Comprehension  Verbalized understanding;Returned demonstration       PT Short Term Goals - 12/12/19 1336      PT SHORT TERM GOAL #1   Title  Independent with initial toileting techniques    Time  4    Period  Weeks    Status  New    Target Date  01/09/20        PT Long Term Goals - 12/12/19 1318      PT LONG TERM GOAL #1   Title  Independent with ongoing HEP    Time  12    Period  Weeks    Status  New  Target Date  03/05/20      PT LONG TERM GOAL #2   Title  Pt will report only 1 BM per day    Time  12    Period  Weeks    Status  New    Target Date  03/05/20      PT LONG  TERM GOAL #3   Title  Pt will report having no fecal smearing on her underwear    Time  12    Period  Weeks    Status  New    Target Date  03/05/20      PT LONG TERM GOAL #4   Title  Pt will be able to feel when she is going to have a bowel movement due to improved awareness of sphincter muscles    Time  12    Period  Weeks    Status  New    Target Date  03/05/20             Plan - 12/12/19 1322    Clinical Impression Statement  Pt presents to skilled PT due to recently developing fecal smearing and mild fecal incontinence.  She had issue with diarrhea last fall and since had difficulty with stool coming out in small amounts when wiping.  Pt has some tension in Rt gluteals and low back. She has slightly elevated tone of her anal sphincter muscles with little change when attempting to contract the sphincters.  She has 2/5 MMT rectally and difficulty isolating pelvic floor muscles.  Pt has increased lumbar lordosis, anterior pelvic tilt and lower core tone.  Pt will benefit from skilled PT to address concerns and impairments for return to full activities and quality of life.    Examination-Activity Limitations  Toileting;Continence    Examination-Participation Restrictions  Community Activity    Stability/Clinical Decision Making  Stable/Uncomplicated    Clinical Decision Making  Low    Rehab Potential  Excellent    PT Frequency  1x / week    PT Duration  12 weeks    PT Treatment/Interventions  ADLs/Self Care Home Management;Biofeedback;Cryotherapy;Electrical Stimulation;Moist Heat;Neuromuscular re-education;Therapeutic exercise;Therapeutic activities;Patient/family education;Manual techniques;Dry needling;Passive range of motion;Taping    PT Next Visit Plan  TC to engage pelvic floor side lying, biofeedback, f/u on initial toileting techniques    Consulted and Agree with Plan of Care  Patient       Patient will benefit from skilled therapeutic intervention in order to improve the  following deficits and impairments:  Impaired tone, Decreased strength, Decreased coordination  Visit Diagnosis: Muscle weakness (generalized)  Unspecified lack of coordination     Problem List Patient Active Problem List   Diagnosis Date Noted  . Incontinence of feces   . Bloating 08/22/2019  . Fecal soiling 08/22/2019  . Obesity (BMI 30-39.9) 11/15/2018  . Anxiety about health 05/17/2018  . OSA (obstructive sleep apnea) 12/27/2017  . Hx of adenomatous colonic polyps 01/17/2017  . Postoperative nausea and vomiting 09/02/2015  . Primary localized osteoarthritis of left knee   . IBS (irritable bowel syndrome)   . Physical exam 05/30/2015  . Hypothyroidism 11/22/2014  . Vitamin D deficiency 09/21/2009  . Hyperlipidemia 09/21/2009  . SKIN CANCER, HX OF 09/15/2009  . Anxiety state 12/19/2007  . GERD 12/19/2007  . ALLERGY 12/19/2007    Jule Ser, PT 12/12/2019, 1:49 PM  [facility] [address] [place], [place], [address] Phone: [phone]   Fax:  [phone]  Name: [name] [name] MRN: [id number] Date of Birth: 06-28-1951

## 2019-12-12 NOTE — Patient Instructions (Signed)
Toileting Techniques for Bowel Movements (Defecation) Using your belly (abdomen) and pelvic floor muscles to have a bowel movement is usually instinctive.  Sometimes people can have problems with these muscles and have to relearn proper defecation (emptying) techniques.  If you have weakness in your muscles, organs that are falling out, decreased sensation in your pelvis, or ignore your urge to go, you may find yourself straining to have a bowel movement.  You are straining if you are: . holding your breath or taking in a huge gulp of air and holding it  . keeping your lips and jaw tensed and closed tightly . turning red in the face because of excessive pushing or forcing . developing or worsening your  hemorrhoids . getting faint while pushing . not emptying completely and have to defecate many times a day  If you are straining, you are actually making it harder for yourself to have a bowel movement.  Many people find they are pulling up with the pelvic floor muscles and closing off instead of opening the anus. Due to lack pelvic floor relaxation and coordination the abdominal muscles, one has to work harder to push the feces out.  Many people have never been taught how to defecate efficiently and effectively.  Notice what happens to your body when you are having a bowel movement.  While you are sitting on the toilet pay attention to the following areas: . Jaw and mouth position . Angle of your hips   . Whether your feet touch the ground or not . Arm placement  . Spine position . Waist . Belly tension . Anus (opening of the anal canal)  An Evacuation/Defecation Plan   Here are the 4 basic points:  1. Lean forward enough for your elbows to rest on your knees 2. Support your feet on the floor or use a low stool if your feet don't touch the floor  3. Push out your belly as if you have swallowed a beach ball-you should feel a widening of your waist 4. Open and relax your pelvic floor muscles,  rather than tightening around the anus      The following conditions my require modifications to your toileting posture:  . If you have had surgery in the past that limits your back, hip, pelvic, knee or ankle flexibility . Constipation   Your healthcare practitioner may make the following additional suggestions and adjustments:  1) Sit on the toilet  a) Make sure your feet are supported. b) Notice your hip angle and spine position-most people find it effective to lean forward or raise their knees, which can help the muscles around the anus to relax  c) When you lean forward, place your forearms on your thighs for support  2) Relax suggestions a) Breath deeply in through your nose and out slowly through your mouth as if you are smelling the flowers and blowing out the candles. b) To become aware of how to relax your muscles, contracting and releasing muscles can be helpful.  Pull your pelvic floor muscles in tightly by using the image of holding back gas, or closing around the anus (visualize making a circle smaller) and lifting the anus up and in.  Then release the muscles and your anus should drop down and feel open. Repeat 5 times ending with the feeling of relaxation. c) Keep your pelvic floor muscles relaxed; let your belly bulge out. d) The digestive tract starts at the mouth and ends at the anal opening, so be   sure to relax both ends of the tube.  Place your tongue on the roof of your mouth with your teeth separated.  This helps relax your mouth and will help to relax the anus at the same time.  3) Empty (defecation) a) Keep your pelvic floor and sphincter relaxed, then bulge your anal muscles.  Make the anal opening wide.  b) Stick your belly out as if you have swallowed a beach ball. c) Make your belly wall hard using your belly muscles while continuing to breathe. Doing this makes it easier to open your anus. d) Breath out and give a grunt (or try using other sounds such as  ahhhh, shhhhh, ohhhh or grrrrrrr).  4) Finish a) As you finish your bowel movement, pull the pelvic floor muscles up and in.  This will leave your anus in the proper place rather than remaining pushed out and down. If you leave your anus pushed out and down, it will start to feel as though that is normal and give you incorrect signals about needing to have a bowel movement.   [facility] [address] [place], [place] [address]  

## 2019-12-13 ENCOUNTER — Other Ambulatory Visit: Payer: Self-pay | Admitting: Family Medicine

## 2020-01-07 ENCOUNTER — Ambulatory Visit: Payer: Medicare HMO | Attending: Gastroenterology | Admitting: Physical Therapy

## 2020-01-07 ENCOUNTER — Other Ambulatory Visit: Payer: Self-pay

## 2020-01-07 DIAGNOSIS — R600 Localized edema: Secondary | ICD-10-CM | POA: Diagnosis not present

## 2020-01-07 DIAGNOSIS — R279 Unspecified lack of coordination: Secondary | ICD-10-CM

## 2020-01-07 DIAGNOSIS — M6281 Muscle weakness (generalized): Secondary | ICD-10-CM | POA: Diagnosis not present

## 2020-01-07 NOTE — Therapy (Signed)
[facility] [address] [place], [place], [address] Phone: [phone]   Fax:  [phone]  Physical Therapy Treatment  Patient Details  Name: [name] [name] MRN: [id number] Date of Birth: [birth year] Referring Provider (PT): Annye Asa, MD   Encounter Date: 01/07/2020  PT End of Session - 01/07/20 1144    Visit Number  2    Date for PT Re-Evaluation  03/05/20    PT Start Time  [id number]    PT Stop Time  1225    PT Time Calculation (min)  40 min    Activity Tolerance  Patient tolerated treatment well    Behavior During Therapy  [facility] for tasks assessed/performed       Past Medical History:  Diagnosis Date  . Allergy, unspecified not elsewhere classified   . Borderline hypertension   . Bronchitis   . Cataract   . DJD (degenerative joint disease)   . GERD (gastroesophageal reflux disease)   . Glaucoma   . History of skin cancer   . Hypercholesterolemia   . Hypothyroidism   . IBS (irritable bowel syndrome)   . OSA (obstructive sleep apnea) 12/27/2017  . Plantar fasciitis   . PONV (postoperative nausea and vomiting)   . Postoperative nausea and vomiting 09/02/2015  . Primary localized osteoarthritis of left knee   . Skin cancer (melanoma) (HCC)    basil cell   . Vitamin D deficiency     Past Surgical History:  Procedure Laterality Date  . ANAL RECTAL MANOMETRY N/A 11/16/2019   Procedure: ANO RECTAL MANOMETRY;  Surgeon: Mauri Pole, MD;  Location: [facility];  Service: Endoscopy;  Laterality: N/A;  . BUNIONECTOMY  2004   left  . CATARACT EXTRACTION W/ INTRAOCULAR LENS  IMPLANT, BILATERAL    . COLONOSCOPY    . FOOT OSTEOTOMY  2003   right  . HERNIA REPAIR  1957  . KNEE ARTHROSCOPY  1987&2003   left  . MELANOMA EXCISION Right    shoulder  . SHOULDER ARTHROSCOPY WITH OPEN ROTATOR CUFF REPAIR  2013   right  . SKIN SURGERY  4/05   Melanoma surg left shoulder  . TOENAIL EXCISION  10/04/2017   . TOTAL KNEE ARTHROPLASTY Left 09/01/2015   Procedure: TOTAL KNEE ARTHROPLASTY;  Surgeon: Elsie Saas, MD;  Location: [place];  Service: Orthopedics;  Laterality: Left;  . TUBAL LIGATION  1990    There were no vitals filed for this visit.  Subjective Assessment - 01/07/20 1151    Subjective  [place]  The wiping multiple times has improved by using the toileting techniques.                       [facility] Adult PT Treatment/Exercise - 01/07/20 0001      Exercises   Exercises  Lumbar      Lumbar Exercises: Stretches   Active Hamstring Stretch  Right;Left;2 reps;20 seconds    Figure 4 Stretch  2 reps;20 seconds;Supine;With overpressure      Lumbar Exercises: Supine   Ab Set  10 reps    Pelvic Tilt  10 reps    Clam  15 reps    Clam Limitations  red    Bent Knee Raise  20 reps;3 seconds    Bridge  10 reps    Other Supine Lumbar Exercises  ball squeeze 15x 3 sec  PT Education - 01/07/20 1242    Education Details  Access Code: [email or web address]    Person(s) Educated  Patient    Methods  Explanation;Demonstration;Handout;Verbal cues    Comprehension  Verbalized understanding;Returned demonstration;Tactile cues required       PT Short Term Goals - 01/07/20 1244      PT SHORT TERM GOAL #1   Title  Independent with initial toileting techniques    Status  Achieved        PT Long Term Goals - 12/12/19 1318      PT LONG TERM GOAL #1   Title  Independent with ongoing HEP    Time  12    Period  Weeks    Status  New    Target Date  03/05/20      PT LONG TERM GOAL #2   Title  Pt will report only 1 BM per day    Time  12    Period  Weeks    Status  New    Target Date  03/05/20      PT LONG TERM GOAL #3   Title  Pt will report having no fecal smearing on her underwear    Time  12    Period  Weeks    Status  New    Target Date  03/05/20      PT LONG TERM GOAL #4   Title  Pt will be able to feel when she is going to  have a bowel movement due to improved awareness of sphincter muscles    Time  12    Period  Weeks    Status  New    Target Date  03/05/20            Plan - 01/07/20 1242    Clinical Impression Statement  Pt did well with exercises and was able to feel the muscles engaging.  Pt required minmal TC to pelvic floor outside of clothing and abdomen for TrA activation.  Pt was able to demonstrate good control during exercises provided and they were added to HEP.  Pt feels some improvement overall at this time.    PT Treatment/Interventions  ADLs/Self Care Home Management;Biofeedback;Cryotherapy;Electrical Stimulation;Moist Heat;Neuromuscular re-education;Therapeutic exercise;Therapeutic activities;Patient/family education;Manual techniques;Dry needling;Passive range of motion;Taping    PT Next Visit Plan  TC to engage pelvic floor side lying, biofeedback if needed, f/u on initial HEP; sitting and nustep added    Consulted and Agree with Plan of Care  Patient       Patient will benefit from skilled therapeutic intervention in order to improve the following deficits and impairments:  Impaired tone, Decreased strength, Decreased coordination  Visit Diagnosis: Muscle weakness (generalized)  Unspecified lack of coordination  Localized edema     Problem List Patient Active Problem List   Diagnosis Date Noted  . Incontinence of feces   . Bloating 08/22/2019  . Fecal soiling 08/22/2019  . Obesity (BMI 30-39.9) 11/15/2018  . Anxiety about health 05/17/2018  . OSA (obstructive sleep apnea) 12/27/2017  . Hx of adenomatous colonic polyps 01/17/2017  . Postoperative nausea and vomiting 09/02/2015  . Primary localized osteoarthritis of left knee   . IBS (irritable bowel syndrome)   . Physical exam 05/30/2015  . Hypothyroidism 11/22/2014  . Vitamin D deficiency 09/21/2009  . Hyperlipidemia 09/21/2009  . SKIN CANCER, HX OF 09/15/2009  . Anxiety state 12/19/2007  . GERD 12/19/2007  .  ALLERGY 12/19/2007    Jule Ser, PT 01/07/2020, 12:48 PM  [facility] [address] [place], [place], [address] Phone: [phone]   Fax:  [phone]  Name: [name] [name] MRN: [id number] Date of Birth: 10/08/1950

## 2020-01-07 NOTE — Patient Instructions (Signed)
Access Code: [email or web address] URL: [email or web address] Date: 01/07/2020 Prepared by: Jari Favre  Exercises Hooklying Small March - 1 x daily - 7 x weekly - 2 sets - 10 reps - 3 sec hold Supine Hip Adductor Squeeze with Small Ball - 3 x daily - 7 x weekly - 10 reps - 1 sets - 3 sec hold Hooklying Clamshells with Resistance - 1 x daily - 7 x weekly - 1 sets - 10 reps Supine Bridge with Pelvic Floor Contraction and Hip Rotation - 1 x daily - 7 x weekly - 1 sets - 10 reps - 5 sec hold Supine Hamstring Stretch with Strap - 1 x daily - 7 x weekly - 3 reps - 1 sets - 30 sec hold Supine Hip External Rotation Stretch - 1 x daily - 7 x weekly - 10 reps - 3 sets Walking with Pelvic Floor Contraction - 1 x daily - 7 x weekly - 1 sets - 10 reps - 3 sec hold

## 2020-01-09 ENCOUNTER — Telehealth: Payer: Self-pay | Admitting: Family Medicine

## 2020-01-09 NOTE — Progress Notes (Signed)
  Chronic Care Management   Outreach Note  01/09/2020 Name: RUBYE BIA MRN: [id number] DOB: May 09, 1951  Referred by: Midge Minium, MD Reason for referral : No chief complaint on file.   An unsuccessful telephone outreach was attempted today. The patient was referred to the pharmacist for assistance with care management and care coordination.   Follow Up Plan:   Earney [place] Upstream Scheduler

## 2020-01-11 ENCOUNTER — Telehealth: Payer: Self-pay | Admitting: Family Medicine

## 2020-01-11 NOTE — Progress Notes (Signed)
  Chronic Care Management   Outreach Note  01/11/2020 Name: CHARENE WROBLESKI MRN: [id number] DOB: November 13, 1950  Referred by: Midge Minium, MD Reason for referral : No chief complaint on file.   A second unsuccessful telephone outreach was attempted today. The patient was referred to pharmacist for assistance with care management and care coordination.  Follow Up Plan:   Earney Hamburg Upstream Scheduler

## 2020-01-14 ENCOUNTER — Ambulatory Visit: Payer: Medicare HMO | Admitting: Physical Therapy

## 2020-01-14 ENCOUNTER — Encounter: Payer: Self-pay | Admitting: Physical Therapy

## 2020-01-14 ENCOUNTER — Other Ambulatory Visit: Payer: Self-pay

## 2020-01-14 DIAGNOSIS — R279 Unspecified lack of coordination: Secondary | ICD-10-CM | POA: Diagnosis not present

## 2020-01-14 DIAGNOSIS — R600 Localized edema: Secondary | ICD-10-CM | POA: Diagnosis not present

## 2020-01-14 DIAGNOSIS — M6281 Muscle weakness (generalized): Secondary | ICD-10-CM | POA: Diagnosis not present

## 2020-01-14 NOTE — Therapy (Signed)
[facility] [address], [place], [place], [address] Phone: [phone]   Fax:  [phone]  Physical Therapy Treatment  Patient Details  Name: [name] [name] MRN: [id number] Date of Birth: 08/12/1951 Referring Provider (PT): Annye Asa, MD   Encounter Date: 01/14/2020  PT End of Session - 01/14/20 1141    Visit Number  3    Date for PT Re-Evaluation  03/05/20    PT Start Time  1141    PT Stop Time  1225    PT Time Calculation (min)  44 min    Activity Tolerance  Patient tolerated treatment well    Behavior During Therapy  [facility] for tasks assessed/performed       Past Medical History:  Diagnosis Date  . Allergy, unspecified not elsewhere classified   . Borderline hypertension   . Bronchitis   . Cataract   . DJD (degenerative joint disease)   . GERD (gastroesophageal reflux disease)   . Glaucoma   . History of skin cancer   . Hypercholesterolemia   . Hypothyroidism   . IBS (irritable bowel syndrome)   . OSA (obstructive sleep apnea) 12/27/2017  . Plantar fasciitis   . PONV (postoperative nausea and vomiting)   . Postoperative nausea and vomiting 09/02/2015  . Primary localized osteoarthritis of left knee   . Skin cancer (melanoma) (HCC)    basil cell   . Vitamin D deficiency     Past Surgical History:  Procedure Laterality Date  . ANAL RECTAL MANOMETRY N/A 11/16/2019   Procedure: ANO RECTAL MANOMETRY;  Surgeon: Mauri Pole, MD;  Location: [facility];  Service: Endoscopy;  Laterality: N/A;  . BUNIONECTOMY  2004   left  . CATARACT EXTRACTION W/ INTRAOCULAR LENS  IMPLANT, BILATERAL    . COLONOSCOPY    . FOOT OSTEOTOMY  2003   right  . HERNIA REPAIR  1957  . KNEE ARTHROSCOPY  1987&2003   left  . MELANOMA EXCISION Right    shoulder  . SHOULDER ARTHROSCOPY WITH OPEN ROTATOR CUFF REPAIR  2013   right  . SKIN SURGERY  4/05   Melanoma surg left shoulder  . TOENAIL EXCISION  10/04/2017   . TOTAL KNEE ARTHROPLASTY Left 09/01/2015   Procedure: TOTAL KNEE ARTHROPLASTY;  Surgeon: Elsie Saas, MD;  Location: [facility];  Service: Orthopedics;  Laterality: Left;  . TUBAL LIGATION  1990    There were no vitals filed for this visit.  Subjective Assessment - 01/14/20 1241    Subjective  Good until today    Patient Stated Goals  Not have any smearing    Currently in Pain?  No/denies                       [facility] Adult PT Treatment/Exercise - 01/14/20 0001      Neuro Re-ed    Neuro Re-ed Details   biofeedback; able to do correctly with cues no holding breath; [id number]; good quick flick; holds about 6 sec and then relax 6      Lumbar Exercises: Standing   Functional Squats Limitations  kegel with squat and hold 3 sec x 10    Other Standing Lumbar Exercises  mraching with kegel - 6 sec hold; 6 rest      Lumbar Exercises: Seated   Other Seated Lumbar Exercises  kegel sitting; sitting with ball squeeze      Lumbar Exercises: Supine   Ab Set  10 reps  Other Supine Lumbar Exercises  ball squeeze 15x 6 sec; squeeze with roll on foam roll      Lumbar Exercises: Sidelying   Clam  Right;10 reps               PT Short Term Goals - 01/07/20 1244      PT SHORT TERM GOAL #1   Title  Independent with initial toileting techniques    Status  Achieved        PT Long Term Goals - 01/14/20 1205      PT LONG TERM GOAL #1   Title  Independent with ongoing HEP    Status  On-going      PT LONG TERM GOAL #2   Title  Pt will report only 1 BM per day    Baseline  having problems today but while I was away was okay    Status  On-going      PT LONG TERM GOAL #3   Title  Pt will report having no fecal smearing on her underwear    Baseline  did not last couple of weeks    Status  Achieved      PT LONG TERM GOAL #4   Title  Pt will be able to feel when she is going to have a bowel movement due to improved awareness of sphincter muscles    Status  On-going             Plan - 01/14/20 1228    Clinical Impression Statement  Pt did well with biofeedback today.  Pt has made progress without any issues while she was at the beach.  Pt was having  a lot of BMs today however and is not sure why. Pt was educated on not holding her breath during the exercise.  she is able to do this correctly when not contracting to 100% max.    PT Treatment/Interventions  ADLs/Self Care Home Management;Biofeedback;Cryotherapy;Electrical Stimulation;Moist Heat;Neuromuscular re-education;Therapeutic exercise;Therapeutic activities;Patient/family education;Manual techniques;Dry needling;Passive range of motion;Taping    PT Next Visit Plan  f/u on adding mini squat; biofeedback if she wants again; sitting on ball; nustep for warm up; core and pelvic floor    PT Home Exercise Plan  Access Code: [email or web address]    Consulted and Agree with Plan of Care  Patient       Patient will benefit from skilled therapeutic intervention in order to improve the following deficits and impairments:  Impaired tone, Decreased strength, Decreased coordination  Visit Diagnosis: Muscle weakness (generalized)  Unspecified lack of coordination     Problem List Patient Active Problem List   Diagnosis Date Noted  . Incontinence of feces   . Bloating 08/22/2019  . Fecal soiling 08/22/2019  . Obesity (BMI 30-39.9) 11/15/2018  . Anxiety about health 05/17/2018  . OSA (obstructive sleep apnea) 12/27/2017  . Hx of adenomatous colonic polyps 01/17/2017  . Postoperative nausea and vomiting 09/02/2015  . Primary localized osteoarthritis of left knee   . IBS (irritable bowel syndrome)   . Physical exam 05/30/2015  . Hypothyroidism 11/22/2014  . Vitamin D deficiency 09/21/2009  . Hyperlipidemia 09/21/2009  . SKIN CANCER, HX OF 09/15/2009  . Anxiety state 12/19/2007  . GERD 12/19/2007  . ALLERGY 12/19/2007    Jule Ser, PT 01/14/2020, 12:45 PM  [facility] [address] [place], [place], [address] Phone: [phone]   Fax:  [phone]  Name: [name] [name] MRN: [id number] Date of Birth:  09/07/1951   

## 2020-01-21 ENCOUNTER — Other Ambulatory Visit: Payer: Self-pay

## 2020-01-21 ENCOUNTER — Encounter: Payer: Self-pay | Admitting: Physical Therapy

## 2020-01-21 ENCOUNTER — Ambulatory Visit: Payer: Medicare HMO | Admitting: Physical Therapy

## 2020-01-21 DIAGNOSIS — R279 Unspecified lack of coordination: Secondary | ICD-10-CM | POA: Diagnosis not present

## 2020-01-21 DIAGNOSIS — M6281 Muscle weakness (generalized): Secondary | ICD-10-CM

## 2020-01-21 DIAGNOSIS — R600 Localized edema: Secondary | ICD-10-CM | POA: Diagnosis not present

## 2020-01-21 NOTE — Patient Instructions (Signed)
Access Code: [email or web address] URL: [email or web address] Date: 01/21/2020 Prepared by: Jari Favre  Exercises Hooklying Small March - 1 x daily - 7 x weekly - 2 sets - 10 reps - 6 sec hold Supine Hip Adductor Squeeze with Small Ball - 3 x daily - 7 x weekly - 10 reps - 1 sets - 6 sec hold Hooklying Clamshells with Resistance - 1 x daily - 7 x weekly - 1 sets - 10 reps Supine Bridge with Pelvic Floor Contraction and Hip Rotation - 1 x daily - 7 x weekly - 1 sets - 10 reps - 6 sec hold Supine Hamstring Stretch with Strap - 1 x daily - 7 x weekly - 3 reps - 1 sets - 30 sec hold Supine Hip External Rotation Stretch - 1 x daily - 7 x weekly - 3 reps - 1 sets - 30 sec hold Walking with Pelvic Floor Contraction - 1 x daily - 7 x weekly - 1 sets - 10 reps - 6 sec hold Sit to stand pelvic - blow as you go - 3 x daily - 7 x weekly - 10 reps - 1 sets

## 2020-01-21 NOTE — Therapy (Signed)
[facility] [address], [name], [place], [address] Phone: [phone]   Fax:  [phone]  Physical Therapy Treatment  Patient Details  Name: [name] [name] MRN: [id number] Date of Birth: Jul 30, 1951 Referring Provider (PT): Annye Asa, MD   Encounter Date: 01/21/2020  PT End of Session - 01/21/20 1136    Visit Number  4    Date for PT Re-Evaluation  03/05/20    PT Start Time  [id number]    PT Stop Time  1219    PT Time Calculation (min)  43 min    Activity Tolerance  Patient tolerated treatment well    Behavior During Therapy  [facility] for tasks assessed/performed       Past Medical History:  Diagnosis Date  . Allergy, unspecified not elsewhere classified   . Borderline hypertension   . Bronchitis   . Cataract   . DJD (degenerative joint disease)   . GERD (gastroesophageal reflux disease)   . Glaucoma   . History of skin cancer   . Hypercholesterolemia   . Hypothyroidism   . IBS (irritable bowel syndrome)   . OSA (obstructive sleep apnea) 12/27/2017  . Plantar fasciitis   . PONV (postoperative nausea and vomiting)   . Postoperative nausea and vomiting 09/02/2015  . Primary localized osteoarthritis of left knee   . Skin cancer (melanoma) (HCC)    basil cell   . Vitamin D deficiency     Past Surgical History:  Procedure Laterality Date  . ANAL RECTAL MANOMETRY N/A 11/16/2019   Procedure: ANO RECTAL MANOMETRY;  Surgeon: Mauri Pole, MD;  Location: [facility];  Service: Endoscopy;  Laterality: N/A;  . BUNIONECTOMY  2004   left  . CATARACT EXTRACTION W/ INTRAOCULAR LENS  IMPLANT, BILATERAL    . COLONOSCOPY    . FOOT OSTEOTOMY  2003   right  . HERNIA REPAIR  1957  . KNEE ARTHROSCOPY  1987&2003   left  . MELANOMA EXCISION Right    shoulder  . SHOULDER ARTHROSCOPY WITH OPEN ROTATOR CUFF REPAIR  2013   right  . SKIN SURGERY  4/05   Melanoma surg left shoulder  . TOENAIL EXCISION  10/04/2017   . TOTAL KNEE ARTHROPLASTY Left 09/01/2015   Procedure: TOTAL KNEE ARTHROPLASTY;  Surgeon: Elsie Saas, MD;  Location: [facility];  Service: Orthopedics;  Laterality: Left;  . TUBAL LIGATION  1990    There were no vitals filed for this visit.  Subjective Assessment - 01/21/20 1138    Subjective  Pt sates she had a great weekend with her grandchildren.    Currently in Pain?  No/denies                       [facility] Adult PT Treatment/Exercise - 01/21/20 0001      Lumbar Exercises: Aerobic   Nustep  L3 x 6 min - PT present to get status update      Lumbar Exercises: Standing   Functional Squats Limitations  hip abdcution red band    Other Standing Lumbar Exercises  tapping toes with kegel 4 ; step ups 6 " - 15x each    Other Standing Lumbar Exercises  pallof press in staggared stance - red band; isometric flexion fwd step yellow band - 20x      Lumbar Exercises: Seated   Sit to Stand  10 reps    Sit to Stand Limitations  kegel      Lumbar Exercises:  Supine   Bridge with clamshell  10 reps;5 seconds             PT Education - 01/21/20 1400    Education Details  Access Code: [email or web address]    Person(s) Educated  Patient    Methods  Explanation;Demonstration;Tactile cues;Verbal cues;Handout    Comprehension  Verbalized understanding;Returned demonstration       PT Short Term Goals - 01/07/20 1244      PT SHORT TERM GOAL #1   Title  Independent with initial toileting techniques    Status  Achieved        PT Long Term Goals - 01/14/20 1205      PT LONG TERM GOAL #1   Title  Independent with ongoing HEP    Status  On-going      PT LONG TERM GOAL #2   Title  Pt will report only 1 BM per day    Baseline  having problems today but while I was away was okay    Status  On-going      PT LONG TERM GOAL #3   Title  Pt will report having no fecal smearing on her underwear    Baseline  did not last couple of weeks    Status  Achieved      PT LONG TERM GOAL #4    Title  Pt will be able to feel when she is going to have a bowel movement due to improved awareness of sphincter muscles    Status  On-going            Plan - 01/21/20 1228    Clinical Impression Statement  Pt was able to progress standing exercises today.  She is doing better with ability to feel when she has to have a BM.  Pt was observed for correct posture and to see if she is holding her breath and did well with both done correctly.  Pt will continue to benefit from skilled PT to work on progression of strength and endurance during functional activities.    PT Treatment/Interventions  ADLs/Self Care Home Management;Biofeedback;Cryotherapy;Electrical Stimulation;Moist Heat;Neuromuscular re-education;Therapeutic exercise;Therapeutic activities;Patient/family education;Manual techniques;Dry needling;Passive range of motion;Taping    PT Next Visit Plan  f/u on sit to stand; add quadruped; sitting on ball; nustep warm up; core and pelvic progression    PT Home Exercise Plan  Access Code: [email or web address]    Consulted and Agree with Plan of Care  Patient       Patient will benefit from skilled therapeutic intervention in order to improve the following deficits and impairments:  Impaired tone, Decreased strength, Decreased coordination  Visit Diagnosis: Muscle weakness (generalized)  Unspecified lack of coordination  Localized edema     Problem List Patient Active Problem List   Diagnosis Date Noted  . Incontinence of feces   . Bloating 08/22/2019  . Fecal soiling 08/22/2019  . Obesity (BMI 30-39.9) 11/15/2018  . Anxiety about health 05/17/2018  . OSA (obstructive sleep apnea) 12/27/2017  . Hx of adenomatous colonic polyps 01/17/2017  . Postoperative nausea and vomiting 09/02/2015  . Primary localized osteoarthritis of left knee   . IBS (irritable bowel syndrome)   . Physical exam 05/30/2015  . Hypothyroidism 11/22/2014  . Vitamin D deficiency 09/21/2009  . Hyperlipidemia  09/21/2009  . SKIN CANCER, HX OF 09/15/2009  . Anxiety state 12/19/2007  . GERD 12/19/2007  . ALLERGY 12/19/2007    Jule Ser, PT 01/21/2020, 2:00 PM  [facility] [address], [place], [place], [address] Phone: [phone]   Fax:  [phone]  Name: [name] [name] MRN: [id number] Date of Birth: 10-02-1951

## 2020-01-24 ENCOUNTER — Telehealth: Payer: Self-pay | Admitting: Family Medicine

## 2020-01-24 NOTE — Progress Notes (Signed)
°  Chronic Care Management   Note  01/24/2020 Name: [name] [name] MRN: [id number] DOB: 23-Mar-1951  [name] [name] is a 69 y.o. year old female who is a primary care patient of Birdie Riddle, Aundra Millet, MD. I reached out to [name] [name] by phone today in response to a referral sent by Ms. Inette L Geiler's PCP, Midge Minium, MD.   [name] [name] was given information about Chronic Care Management services today including:  1. CCM service includes personalized support from designated clinical staff supervised by her physician, including individualized plan of care and coordination with other care providers 2. 24/7 contact phone numbers for assistance for urgent and routine care needs. 3. Service will only be billed when office clinical staff spend 20 minutes or more in a month to coordinate care. 4. Only one practitioner may furnish and bill the service in a calendar month. 5. The patient may stop CCM services at any time (effective at the end of the month) by phone call to the office staff.   Patient agreed to services and verbal consent obtained.   Follow up plan:   Earney Hamburg [employer] Scheduler

## 2020-01-28 ENCOUNTER — Ambulatory Visit: Payer: Medicare HMO | Admitting: Physical Therapy

## 2020-01-28 ENCOUNTER — Other Ambulatory Visit: Payer: Self-pay

## 2020-01-28 DIAGNOSIS — R279 Unspecified lack of coordination: Secondary | ICD-10-CM | POA: Diagnosis not present

## 2020-01-28 DIAGNOSIS — R600 Localized edema: Secondary | ICD-10-CM | POA: Diagnosis not present

## 2020-01-28 DIAGNOSIS — M6281 Muscle weakness (generalized): Secondary | ICD-10-CM

## 2020-01-28 NOTE — Therapy (Signed)
[facility] [address] [place], [place], [address] Phone: [phone]   Fax:  [phone]  Physical Therapy Treatment  Patient Details  Name: [name] [name] MRN: [id number] Date of Birth: 1951-05-29 Referring Provider (PT): Annye Asa, MD   Encounter Date: 01/28/2020  PT End of Session - 01/28/20 1411    Visit Number  5    Date for PT Re-Evaluation  03/05/20    PT Start Time  1140    PT Stop Time  1225    PT Time Calculation (min)  45 min    Activity Tolerance  Patient tolerated treatment well    Behavior During Therapy  [facility] for tasks assessed/performed       Past Medical History:  Diagnosis Date  . Allergy, unspecified not elsewhere classified   . Borderline hypertension   . Bronchitis   . Cataract   . DJD (degenerative joint disease)   . GERD (gastroesophageal reflux disease)   . Glaucoma   . History of skin cancer   . Hypercholesterolemia   . Hypothyroidism   . IBS (irritable bowel syndrome)   . OSA (obstructive sleep apnea) 12/27/2017  . Plantar fasciitis   . PONV (postoperative nausea and vomiting)   . Postoperative nausea and vomiting 09/02/2015  . Primary localized osteoarthritis of left knee   . Skin cancer (melanoma) (HCC)    basil cell   . Vitamin D deficiency     Past Surgical History:  Procedure Laterality Date  . ANAL RECTAL MANOMETRY N/A 11/16/2019   Procedure: ANO RECTAL MANOMETRY;  Surgeon: Mauri Pole, MD;  Location: [facility];  Service: Endoscopy;  Laterality: N/A;  . BUNIONECTOMY  2004   left  . CATARACT EXTRACTION W/ INTRAOCULAR LENS  IMPLANT, BILATERAL    . COLONOSCOPY    . FOOT OSTEOTOMY  2003   right  . HERNIA REPAIR  1957  . KNEE ARTHROSCOPY  1987&2003   left  . MELANOMA EXCISION Right    shoulder  . SHOULDER ARTHROSCOPY WITH OPEN ROTATOR CUFF REPAIR  2013   right  . SKIN SURGERY  4/05   Melanoma surg left shoulder  . TOENAIL EXCISION  10/04/2017   . TOTAL KNEE ARTHROPLASTY Left 09/01/2015   Procedure: TOTAL KNEE ARTHROPLASTY;  Surgeon: Elsie Saas, MD;  Location: [facility];  Service: Orthopedics;  Laterality: Left;  . TUBAL LIGATION  1990    There were no vitals filed for this visit.  Subjective Assessment - 01/28/20 1418    Subjective  Had a great weekend with family.  I had a few days where I had to wipe a lot to get clean    Currently in Pain?  No/denies                                 PT Short Term Goals - 01/07/20 1244      PT SHORT TERM GOAL #1   Title  Independent with initial toileting techniques    Status  Achieved        PT Long Term Goals - 01/14/20 1205      PT LONG TERM GOAL #1   Title  Independent with ongoing HEP    Status  On-going      PT LONG TERM GOAL #2   Title  Pt will report only 1 BM per day    Baseline  having problems today but while I  was away was okay    Status  On-going      PT LONG TERM GOAL #3   Title  Pt will report having no fecal smearing on her underwear    Baseline  did not last couple of weeks    Status  Achieved      PT LONG TERM GOAL #4   Title  Pt will be able to feel when she is going to have a bowel movement due to improved awareness of sphincter muscles    Status  On-going            Plan - 01/28/20 1412    Clinical Impression Statement  Pt did well with porgression of core and pelvic strength today.  She continues to need some cues to exhale on exertion.  Pt needs cues for this about 25-30% of the time.  Ptwas able to progress with light resistance and increase the amount of time she engaged her pelvic floor.  She is still having episodes of having to wipe excessive amounts of time . She will benefit from skilled PT to work on increased strength and endurance.    PT Treatment/Interventions  ADLs/Self Care Home Management;Biofeedback;Cryotherapy;Electrical Stimulation;Moist Heat;Neuromuscular re-education;Therapeutic exercise;Therapeutic  activities;Patient/family education;Manual techniques;Dry needling;Passive range of motion;Taping    PT Next Visit Plan  f/u on sit to stand; add quadruped; sitting on ball; nustep warm up; core and pelvic progression    PT Home Exercise Plan  Access Code: [email or web address]    Consulted and Agree with Plan of Care  Patient       Patient will benefit from skilled therapeutic intervention in order to improve the following deficits and impairments:  Impaired tone, Decreased strength, Decreased coordination  Visit Diagnosis: Muscle weakness (generalized)  Unspecified lack of coordination  Localized edema     Problem List Patient Active Problem List   Diagnosis Date Noted  . Incontinence of feces   . Bloating 08/22/2019  . Fecal soiling 08/22/2019  . Obesity (BMI 30-39.9) 11/15/2018  . Anxiety about health 05/17/2018  . OSA (obstructive sleep apnea) 12/27/2017  . Hx of adenomatous colonic polyps 01/17/2017  . Postoperative nausea and vomiting 09/02/2015  . Primary localized osteoarthritis of left knee   . IBS (irritable bowel syndrome)   . Physical exam 05/30/2015  . Hypothyroidism 11/22/2014  . Vitamin D deficiency 09/21/2009  . Hyperlipidemia 09/21/2009  . SKIN CANCER, HX OF 09/15/2009  . Anxiety state 12/19/2007  . GERD 12/19/2007  . ALLERGY 12/19/2007    Jule Ser, PT 01/28/2020, 2:18 PM  [facility] [address], [place], [place], [address] Phone: [phone]   Fax:  [phone]  Name: [name] [name] MRN: [id number] Date of Birth: Sep 29, 1951

## 2020-02-04 ENCOUNTER — Other Ambulatory Visit: Payer: Self-pay

## 2020-02-04 ENCOUNTER — Ambulatory Visit: Payer: Medicare HMO | Attending: Gastroenterology | Admitting: Physical Therapy

## 2020-02-04 DIAGNOSIS — M6281 Muscle weakness (generalized): Secondary | ICD-10-CM | POA: Insufficient documentation

## 2020-02-04 DIAGNOSIS — R279 Unspecified lack of coordination: Secondary | ICD-10-CM | POA: Insufficient documentation

## 2020-02-04 DIAGNOSIS — R600 Localized edema: Secondary | ICD-10-CM | POA: Diagnosis not present

## 2020-02-04 NOTE — Therapy (Signed)
[facility] [address] [place], [place], [address] Phone: [phone]   Fax:  [phone]  Physical Therapy Treatment  Patient Details  Name: [name] [name] MRN: [id number] Date of Birth: 1951-06-23 Referring Provider (PT): Annye Asa, MD   Encounter Date: 02/04/2020  PT End of Session - 02/04/20 1223    Visit Number  6    Date for PT Re-Evaluation  03/05/20    PT Start Time  [id number]    PT Stop Time  1226    PT Time Calculation (min)  41 min    Activity Tolerance  Patient tolerated treatment well    Behavior During Therapy  [facility] for tasks assessed/performed       Past Medical History:  Diagnosis Date  . Allergy, unspecified not elsewhere classified   . Borderline hypertension   . Bronchitis   . Cataract   . DJD (degenerative joint disease)   . GERD (gastroesophageal reflux disease)   . Glaucoma   . History of skin cancer   . Hypercholesterolemia   . Hypothyroidism   . IBS (irritable bowel syndrome)   . OSA (obstructive sleep apnea) 12/27/2017  . Plantar fasciitis   . PONV (postoperative nausea and vomiting)   . Postoperative nausea and vomiting 09/02/2015  . Primary localized osteoarthritis of left knee   . Skin cancer (melanoma) (HCC)    basil cell   . Vitamin D deficiency     Past Surgical History:  Procedure Laterality Date  . ANAL RECTAL MANOMETRY N/A 11/16/2019   Procedure: ANO RECTAL MANOMETRY;  Surgeon: Mauri Pole, MD;  Location: [facility];  Service: Endoscopy;  Laterality: N/A;  . BUNIONECTOMY  2004   left  . CATARACT EXTRACTION W/ INTRAOCULAR LENS  IMPLANT, BILATERAL    . COLONOSCOPY    . FOOT OSTEOTOMY  2003   right  . HERNIA REPAIR  1957  . KNEE ARTHROSCOPY  1987&2003   left  . MELANOMA EXCISION Right    shoulder  . SHOULDER ARTHROSCOPY WITH OPEN ROTATOR CUFF REPAIR  2013   right  . SKIN SURGERY  4/05   Melanoma surg left shoulder  . TOENAIL EXCISION  10/04/2017   . TOTAL KNEE ARTHROPLASTY Left 09/01/2015   Procedure: TOTAL KNEE ARTHROPLASTY;  Surgeon: Elsie Saas, MD;  Location: [place];  Service: Orthopedics;  Laterality: Left;  . TUBAL LIGATION  1990    There were no vitals filed for this visit.  Subjective Assessment - 02/04/20 1228    Subjective  Overall, better but still having to wipe a lot at times and getting something there even when just going to urinate    Patient Stated Goals  Not have any smearing    Currently in Pain?  No/denies                        [facility] Adult PT Treatment/Exercise - 02/04/20 0001      Lumbar Exercises: Stretches   Active Hamstring Stretch  Right;Left;2 reps;20 seconds    Hip Flexor Stretch  Right;Left;2 reps;30 seconds      Lumbar Exercises: Aerobic   Nustep  L3 x 6 min - PT present to get status update      Lumbar Exercises: Standing   Wall Slides  5 reps      Lumbar Exercises: Prone   Other Prone Lumbar Exercises  toes in kegel 5 sec hold x 10 ; 20 sec x 2  Lumbar Exercises: Quadruped   Other Quadruped Lumbar Exercises  modified forearms on mat - toes in kegel      Manual Therapy   Manual Therapy  Soft tissue mobilization    Manual therapy comments  Lt>Rt    Soft tissue mobilization  lumbar erectors and QL               PT Short Term Goals - 01/07/20 1244      PT SHORT TERM GOAL #1   Title  Independent with initial toileting techniques    Status  Achieved        PT Long Term Goals - 01/14/20 1205      PT LONG TERM GOAL #1   Title  Independent with ongoing HEP    Status  On-going      PT LONG TERM GOAL #2   Title  Pt will report only 1 BM per day    Baseline  having problems today but while I was away was okay    Status  On-going      PT LONG TERM GOAL #3   Title  Pt will report having no fecal smearing on her underwear    Baseline  did not last couple of weeks    Status  Achieved      PT LONG TERM GOAL #4   Title  Pt will be able to feel when she  is going to have a bowel movement due to improved awareness of sphincter muscles    Status  On-going            Plan - 02/04/20 1229    Clinical Impression Statement  Today's session focused on ensureing that she is bulging correctly when having BM.  Pt was educated in and performed exercises with contracting pelvic floor muscles in isolation.  She did well with these exercises after STM to lumbar Lt>Rt to release lumbar erectors and QL.  Pt will benefit from skilled PT to continue to progress muscle strength and coordination for improved bowel function.    PT Treatment/Interventions  ADLs/Self Care Home Management;Biofeedback;Cryotherapy;Electrical Stimulation;Moist Heat;Neuromuscular re-education;Therapeutic exercise;Therapeutic activities;Patient/family education;Manual techniques;Dry needling;Passive range of motion;Taping    PT Next Visit Plan  f/u breathing and bulging; continue quadruped; sitting on ball; nustep warm up; core and pelvic progression    PT Home Exercise Plan  Access Code: [email or web address]    Consulted and Agree with Plan of Care  Patient       Patient will benefit from skilled therapeutic intervention in order to improve the following deficits and impairments:  Impaired tone, Decreased strength, Decreased coordination  Visit Diagnosis: Muscle weakness (generalized)  Unspecified lack of coordination  Localized edema     Problem List Patient Active Problem List   Diagnosis Date Noted  . Incontinence of feces   . Bloating 08/22/2019  . Fecal soiling 08/22/2019  . Obesity (BMI 30-39.9) 11/15/2018  . Anxiety about health 05/17/2018  . OSA (obstructive sleep apnea) 12/27/2017  . Hx of adenomatous colonic polyps 01/17/2017  . Postoperative nausea and vomiting 09/02/2015  . Primary localized osteoarthritis of left knee   . IBS (irritable bowel syndrome)   . Physical exam 05/30/2015  . Hypothyroidism 11/22/2014  . Vitamin D deficiency 09/21/2009  . Hyperlipidemia  09/21/2009  . SKIN CANCER, HX OF 09/15/2009  . Anxiety state 12/19/2007  . GERD 12/19/2007  . ALLERGY 12/19/2007    Jule Ser, PT 02/04/2020, 12:33 PM  [facility] [address]  [address] [place], [place], [address] Phone: [phone]   Fax:  [phone]  Name: OLIVIAMARIE DUENSING MRN: [id number] Date of Birth: 01/23/1951

## 2020-02-07 ENCOUNTER — Other Ambulatory Visit: Payer: Self-pay | Admitting: Family Medicine

## 2020-02-11 ENCOUNTER — Other Ambulatory Visit: Payer: Self-pay

## 2020-02-11 ENCOUNTER — Encounter: Payer: Self-pay | Admitting: Physical Therapy

## 2020-02-11 ENCOUNTER — Ambulatory Visit: Payer: Medicare HMO | Admitting: Physical Therapy

## 2020-02-11 DIAGNOSIS — M6281 Muscle weakness (generalized): Secondary | ICD-10-CM | POA: Diagnosis not present

## 2020-02-11 DIAGNOSIS — R279 Unspecified lack of coordination: Secondary | ICD-10-CM

## 2020-02-11 DIAGNOSIS — R600 Localized edema: Secondary | ICD-10-CM | POA: Diagnosis not present

## 2020-02-11 NOTE — Patient Instructions (Signed)
Access Code: [email or web address] URL: [email or web address] Date: 02/04/2020 Prepared by: Jari Favre  Exercises Hooklying Small March - 1 x daily - 7 x weekly - 2 sets - 10 reps - 6 sec hold Supine Hip Adductor Squeeze with Small Ball - 3 x daily - 7 x weekly - 10 reps - 1 sets - 6 sec hold Hooklying Clamshells with Resistance - 1 x daily - 7 x weekly - 1 sets - 10 reps Supine Bridge with Pelvic Floor Contraction and Hip Rotation - 1 x daily - 7 x weekly - 1 sets - 10 reps - 6 sec hold Supine Hamstring Stretch with Strap - 1 x daily - 7 x weekly - 3 reps - 1 sets - 30 sec hold Supine Hip External Rotation Stretch - 1 x daily - 7 x weekly - 3 reps - 1 sets - 30 sec hold Walking with Pelvic Floor Contraction - 1 x daily - 7 x weekly - 1 sets - 10 reps - 6 sec hold Sit to stand pelvic - blow as you go - 3 x daily - 7 x weekly - 10 reps - 1 sets Standing Hip Flexor Stretch - 3 x daily - 7 x weekly - 2 reps - 1 sets - 30 sec hold Seated Flexion Stretch with Swiss Ball - 1 x daily - 7 x weekly - 10 reps - 3 sets Swiss Ball March and Kick with Arms at Side - 1 x daily - 7 x weekly - 10 reps - 3 sets Wall Push Up - 1 x daily - 7 x weekly - 10 reps - 3 sets

## 2020-02-11 NOTE — Therapy (Signed)
[facility] [address] [place], [place], [address] Phone: [phone]   Fax:  [phone]  Physical Therapy Treatment  Patient Details  Name: [name] [name] MRN: [id number] Date of Birth: November 19, 1950 Referring Provider (PT): Annye Asa, MD   Encounter Date: 02/11/2020  PT End of Session - 02/11/20 1233    Visit Number  7    Date for PT Re-Evaluation  03/05/20    PT Start Time  6962    PT Stop Time  1231    PT Time Calculation (min)  45 min    Activity Tolerance  Patient tolerated treatment well    Behavior During Therapy  [facility] for tasks assessed/performed       Past Medical History:  Diagnosis Date  . Allergy, unspecified not elsewhere classified   . Borderline hypertension   . Bronchitis   . Cataract   . DJD (degenerative joint disease)   . GERD (gastroesophageal reflux disease)   . Glaucoma   . History of skin cancer   . Hypercholesterolemia   . Hypothyroidism   . IBS (irritable bowel syndrome)   . OSA (obstructive sleep apnea) 12/27/2017  . Plantar fasciitis   . PONV (postoperative nausea and vomiting)   . Postoperative nausea and vomiting 09/02/2015  . Primary localized osteoarthritis of left knee   . Skin cancer (melanoma) (HCC)    basil cell   . Vitamin D deficiency     Past Surgical History:  Procedure Laterality Date  . ANAL RECTAL MANOMETRY N/A 11/16/2019   Procedure: ANO RECTAL MANOMETRY;  Surgeon: Mauri Pole, MD;  Location: [facility];  Service: Endoscopy;  Laterality: N/A;  . BUNIONECTOMY  2004   left  . CATARACT EXTRACTION W/ INTRAOCULAR LENS  IMPLANT, BILATERAL    . COLONOSCOPY    . FOOT OSTEOTOMY  2003   right  . HERNIA REPAIR  1957  . KNEE ARTHROSCOPY  1987&2003   left  . MELANOMA EXCISION Right    shoulder  . SHOULDER ARTHROSCOPY WITH OPEN ROTATOR CUFF REPAIR  2013   right  . SKIN SURGERY  4/05   Melanoma surg left shoulder  . TOENAIL EXCISION  10/04/2017   . TOTAL KNEE ARTHROPLASTY Left 09/01/2015   Procedure: TOTAL KNEE ARTHROPLASTY;  Surgeon: Elsie Saas, MD;  Location: [place];  Service: Orthopedics;  Laterality: Left;  . TUBAL LIGATION  1990    There were no vitals filed for this visit.  Subjective Assessment - 02/11/20 1234    Subjective  Overall, the bowel movement and leakage is the same.  Better since I started but no change for the last few weeks.    Patient Stated Goals  Not have any smearing    Currently in Pain?  No/denies                       [facility] Adult PT Treatment/Exercise - 02/11/20 0001      Lumbar Exercises: Seated   Other Seated Lumbar Exercises  seated on pball LAQ and circles    Other Seated Lumbar Exercises  seated lumbar flexion stretch      Lumbar Exercises: Quadruped   Other Quadruped Lumbar Exercises  reaches and bracing; backward rocking    Other Quadruped Lumbar Exercises  walll push up on ball 20x      Manual Therapy   Manual therapy comments  Lt>Rt; pt informed and identity confirmed for internal STM    Soft tissue mobilization  levators and OI on Lt side; myofascial release to left abdomen             PT Education - 02/11/20 1232    Education Details  Access Code: [email or web address]    Person(s) Educated  Patient    Methods  Explanation;Demonstration    Comprehension  Verbalized understanding;Returned demonstration       PT Short Term Goals - 01/07/20 1244      PT SHORT TERM GOAL #1   Title  Independent with initial toileting techniques    Status  Achieved        PT Long Term Goals - 02/11/20 1208      PT LONG TERM GOAL #1   Title  Independent with ongoing HEP    Status  On-going      PT LONG TERM GOAL #2   Title  Pt will report only 1 BM per day    Baseline  still has days where it just keeps coming, stool is softer on those days, but has gotten better    Status  Partially Met      PT LONG TERM GOAL #3   Title  Pt will report having no fecal smearing on her  underwear    Status  Achieved      PT LONG TERM GOAL #4   Title  Pt will be able to feel when she is going to have a bowel movement due to improved awareness of sphincter muscles    Baseline  sometimes just know when I go to the bathroom    Status  On-going            Plan - 02/11/20 1214    Clinical Impression Statement  Pt has some tenderness with palpation to the Lt levators.  Responded well to [facility] internally and myofascial release to Lt abdomen.  Pt is still progressing with core strength and did well with muscle coordination with contract relax after gettting tactile and verbal cues. Pt will benefit from one more visit to ensure successful transition to HEP    PT Treatment/Interventions  ADLs/Self Care Home Management;Biofeedback;Cryotherapy;Electrical Stimulation;Moist Heat;Neuromuscular re-education;Therapeutic exercise;Therapeutic activities;Patient/family education;Manual techniques;Dry needling;Passive range of motion;Taping    PT Next Visit Plan  d/c likely; f/u on STM and final goals review; review strength, HEP progress or adjust as needed    PT Home Exercise Plan  Access Code: [email or web address]    Consulted and Agree with Plan of Care  Patient       Patient will benefit from skilled therapeutic intervention in order to improve the following deficits and impairments:  Impaired tone, Decreased strength, Decreased coordination  Visit Diagnosis: Muscle weakness (generalized)  Unspecified lack of coordination  Localized edema     Problem List Patient Active Problem List   Diagnosis Date Noted  . Incontinence of feces   . Bloating 08/22/2019  . Fecal soiling 08/22/2019  . Obesity (BMI 30-39.9) 11/15/2018  . Anxiety about health 05/17/2018  . OSA (obstructive sleep apnea) 12/27/2017  . Hx of adenomatous colonic polyps 01/17/2017  . Postoperative nausea and vomiting 09/02/2015  . Primary localized osteoarthritis of left knee   . IBS (irritable bowel syndrome)   .  Physical exam 05/30/2015  . Hypothyroidism 11/22/2014  . Vitamin D deficiency 09/21/2009  . Hyperlipidemia 09/21/2009  . SKIN CANCER, HX OF 09/15/2009  . Anxiety state 12/19/2007  . GERD 12/19/2007  . ALLERGY 12/19/2007    Jule Ser, PT 02/11/2020, 12:41 PM  [facility] [address] [place], [place], [address] Phone: [phone]   Fax:  [phone]  Name: EMMELINE WINEBARGER MRN: [id number] Date of Birth: 07-Dec-1950

## 2020-02-13 ENCOUNTER — Other Ambulatory Visit: Payer: Self-pay | Admitting: General Practice

## 2020-02-13 DIAGNOSIS — R002 Palpitations: Secondary | ICD-10-CM

## 2020-02-13 DIAGNOSIS — Z85820 Personal history of malignant melanoma of skin: Secondary | ICD-10-CM | POA: Diagnosis not present

## 2020-02-13 DIAGNOSIS — L905 Scar conditions and fibrosis of skin: Secondary | ICD-10-CM | POA: Diagnosis not present

## 2020-02-13 DIAGNOSIS — E785 Hyperlipidemia, unspecified: Secondary | ICD-10-CM

## 2020-02-13 DIAGNOSIS — Z08 Encounter for follow-up examination after completed treatment for malignant neoplasm: Secondary | ICD-10-CM | POA: Diagnosis not present

## 2020-02-13 DIAGNOSIS — D485 Neoplasm of uncertain behavior of skin: Secondary | ICD-10-CM | POA: Diagnosis not present

## 2020-02-13 DIAGNOSIS — E669 Obesity, unspecified: Secondary | ICD-10-CM

## 2020-02-13 DIAGNOSIS — L570 Actinic keratosis: Secondary | ICD-10-CM | POA: Diagnosis not present

## 2020-02-18 ENCOUNTER — Encounter: Payer: Medicare HMO | Admitting: Physical Therapy

## 2020-02-25 ENCOUNTER — Ambulatory Visit: Payer: Medicare HMO | Admitting: Physical Therapy

## 2020-02-25 ENCOUNTER — Other Ambulatory Visit: Payer: Self-pay

## 2020-02-25 DIAGNOSIS — R600 Localized edema: Secondary | ICD-10-CM | POA: Diagnosis not present

## 2020-02-25 DIAGNOSIS — R279 Unspecified lack of coordination: Secondary | ICD-10-CM | POA: Diagnosis not present

## 2020-02-25 DIAGNOSIS — M6281 Muscle weakness (generalized): Secondary | ICD-10-CM

## 2020-02-25 NOTE — Patient Instructions (Signed)
    [email or web address] 

## 2020-02-25 NOTE — Therapy (Signed)
[facility] [address] [place], [place], [address] Phone: [phone]   Fax:  [phone]  Physical Therapy Treatment  Patient Details  Name: [name] [name] MRN: [id number] Date of Birth: [birth year] Referring Provider (PT): Annye Asa, MD   Encounter Date: 02/25/2020  PT End of Session - 02/25/20 1137    Visit Number  8    Date for PT Re-Evaluation  03/05/20    PT Start Time  1138    PT Stop Time  1220    PT Time Calculation (min)  42 min    Activity Tolerance  Patient tolerated treatment well    Behavior During Therapy  [facility] for tasks assessed/performed       Past Medical History:  Diagnosis Date  . Allergy, unspecified not elsewhere classified   . Borderline hypertension   . Bronchitis   . Cataract   . DJD (degenerative joint disease)   . GERD (gastroesophageal reflux disease)   . Glaucoma   . History of skin cancer   . Hypercholesterolemia   . Hypothyroidism   . IBS (irritable bowel syndrome)   . OSA (obstructive sleep apnea) 12/27/2017  . Plantar fasciitis   . PONV (postoperative nausea and vomiting)   . Postoperative nausea and vomiting 09/02/2015  . Primary localized osteoarthritis of left knee   . Skin cancer (melanoma) (HCC)    basil cell   . Vitamin D deficiency     Past Surgical History:  Procedure Laterality Date  . ANAL RECTAL MANOMETRY N/A 11/16/2019   Procedure: ANO RECTAL MANOMETRY;  Surgeon: Mauri Pole, MD;  [facility]: [facility];  Service: Endoscopy;  Laterality: N/A;  . BUNIONECTOMY  2004   left  . CATARACT EXTRACTION W/ INTRAOCULAR LENS  IMPLANT, BILATERAL    . COLONOSCOPY    . FOOT OSTEOTOMY  2003   right  . HERNIA REPAIR  1957  . KNEE ARTHROSCOPY  1987&2003   left  . MELANOMA EXCISION Right    shoulder  . SHOULDER ARTHROSCOPY WITH OPEN ROTATOR CUFF REPAIR  2013   right  . SKIN SURGERY  4/05   Melanoma surg left shoulder  . TOENAIL EXCISION  10/04/2017   . TOTAL KNEE ARTHROPLASTY Left 09/01/2015   Procedure: TOTAL KNEE ARTHROPLASTY;  Surgeon: Elsie Saas, MD;  Location: [place];  Service: Orthopedics;  Laterality: Left;  . TUBAL LIGATION  1990    There were no vitals filed for this visit.  Subjective Assessment - 02/25/20 1154    Subjective  Overall, I think it is 60% imporved.  I still have                        [place] [facility] Adult PT Treatment/Exercise - 02/25/20 0001      Lumbar Exercises: Aerobic   Nustep  L3 x 6 min - PT present to get status update      Lumbar Exercises: Standing   Functional Squats  10 reps    Functional Squats Limitations  slow lowering    Forward Lunge Limitations  sliders 3 ways with 3 sec hold - 10x each way    Wall Slides  5 reps;3 seconds    Other Standing Lumbar Exercises  wall push up      Lumbar Exercises: Seated   Other Seated Lumbar Exercises  hip series - 10x each with kegel      Lumbar Exercises: Sidelying   Hip Abduction  Right;Left;10 reps  PT Education - 02/25/20 1206    Education Details  pelvic release wand for bowel movement    Person(s) Educated  Patient    Methods  Explanation;Demonstration;Verbal cues;Handout    Comprehension  Verbalized understanding       PT Short Term Goals - 01/07/20 1244      PT SHORT TERM GOAL #1   Title  Independent with initial toileting techniques    Status  Achieved        PT Long Term Goals - 02/25/20 1155      PT LONG TERM GOAL #1   Title  Independent with ongoing HEP    Status  Achieved      PT LONG TERM GOAL #2   Title  Pt will report only 1 BM per day    Status  Not Met      PT LONG TERM GOAL #3   Title  Pt will report having no fecal smearing on her underwear    Baseline  60% better    Status  Partially Met      PT LONG TERM GOAL #4   Title  Pt will be able to feel when she is going to have a bowel movement due to improved awareness of sphincter muscles    Baseline  sometimes just know when I go  to the bathroom    Status  Not Met            Plan - 02/25/20 1305    Clinical Impression Statement  Pt has not made any progress towards her goals since last couple of visits.  Pt is doing exercises correctly and has made good progress overall. She will benefit from discharge with HEP today    PT Treatment/Interventions  ADLs/Self Care Home Management;Biofeedback;Cryotherapy;Electrical Stimulation;Moist Heat;Neuromuscular re-education;Therapeutic exercise;Therapeutic activities;Patient/family education;Manual techniques;Dry needling;Passive range of motion;Taping    PT Next Visit Plan  d/c today    PT Home Exercise Plan  Access Code: [email or web address]    Consulted and Agree with Plan of Care  Patient       Patient will benefit from skilled therapeutic intervention in order to improve the following deficits and impairments:  Impaired tone, Decreased strength, Decreased coordination  Visit Diagnosis: Muscle weakness (generalized)  Unspecified lack of coordination  Localized edema     Problem List Patient Active Problem List   Diagnosis Date Noted  . Incontinence of feces   . Bloating 08/22/2019  . Fecal soiling 08/22/2019  . Obesity (BMI 30-39.9) 11/15/2018  . Anxiety about health 05/17/2018  . OSA (obstructive sleep apnea) 12/27/2017  . Hx of adenomatous colonic polyps 01/17/2017  . Postoperative nausea and vomiting 09/02/2015  . Primary localized osteoarthritis of left knee   . IBS (irritable bowel syndrome)   . Physical exam 05/30/2015  . Hypothyroidism 11/22/2014  . Vitamin D deficiency 09/21/2009  . Hyperlipidemia 09/21/2009  . SKIN CANCER, HX OF 09/15/2009  . Anxiety state 12/19/2007  . GERD 12/19/2007  . ALLERGY 12/19/2007    Jule Ser, PT 02/25/2020, 1:07 PM  [facility] [address], [place], [place], [address] Phone: [phone]   Fax:  [phone]  Name: [name] [name] MRN:  [id number] Date of Birth: Apr 28, 1951  PHYSICAL THERAPY DISCHARGE SUMMARY  Visits from Start of Care: 8  Current functional level related to goals / functional outcomes: See above   Remaining deficits: See above   Education / Equipment: HEP  Plan: Patient agrees to discharge.  Patient  goals were partially met. Patient is being discharged due to lack of progress.  ?????     [employer], PT 02/25/20 1:08 PM

## 2020-03-11 NOTE — Progress Notes (Signed)
error 

## 2020-03-12 ENCOUNTER — Ambulatory Visit: Payer: Medicare HMO

## 2020-03-12 ENCOUNTER — Telehealth: Payer: Self-pay

## 2020-03-12 ENCOUNTER — Other Ambulatory Visit: Payer: Self-pay

## 2020-03-12 NOTE — Telephone Encounter (Signed)
  Chronic Care Management   Outreach Note   Name: [name] [name] MRN: [id number] DOB: 1950-11-21  Referred by: Midge Minium, MD Reason for referral: Telephone Appointment with [place] Pharmacist, Madelin Rear.    An unsuccessful telephone outreach was attempted today. The patient was referred to the pharmacist for assistance with care management and care coordination.    Telephone appointment with clinical pharmacist 03/12/2020 at 830am. If patient returns call immediately transfer to ext [phone], otherwise please provide number below to reschedule visit.   Madelin Rear, Pharm.D., BCGP Clinical Pharmacist [facility] [phone]

## 2020-03-13 ENCOUNTER — Telehealth: Payer: Self-pay | Admitting: Family Medicine

## 2020-03-13 NOTE — Progress Notes (Signed)
°  Chronic Care Management   Outreach Note  03/13/2020 Name: RENETTA SUMAN MRN: [id number] DOB: 05/25/1951  Referred by: Midge Minium, MD Reason for referral : No chief complaint on file.   An unsuccessful telephone outreach was attempted today. The patient was referred to the pharmacist for assistance with care management and care coordination.   This note is not being shared with the patient for the following reason: To respect privacy (The patient or proxy has requested that the information not be shared).  Follow Up Plan:   E[facility] [place] [facility] Scheduler

## 2020-04-02 DIAGNOSIS — L578 Other skin changes due to chronic exposure to nonionizing radiation: Secondary | ICD-10-CM | POA: Diagnosis not present

## 2020-04-02 DIAGNOSIS — D485 Neoplasm of uncertain behavior of skin: Secondary | ICD-10-CM | POA: Diagnosis not present

## 2020-04-02 DIAGNOSIS — L821 Other seborrheic keratosis: Secondary | ICD-10-CM | POA: Diagnosis not present

## 2020-04-02 DIAGNOSIS — L570 Actinic keratosis: Secondary | ICD-10-CM | POA: Diagnosis not present

## 2020-04-28 ENCOUNTER — Other Ambulatory Visit: Payer: Self-pay | Admitting: Family Medicine

## 2020-04-29 DIAGNOSIS — H02831 Dermatochalasis of right upper eyelid: Secondary | ICD-10-CM | POA: Diagnosis not present

## 2020-04-29 DIAGNOSIS — H43813 Vitreous degeneration, bilateral: Secondary | ICD-10-CM | POA: Diagnosis not present

## 2020-04-29 DIAGNOSIS — H18413 Arcus senilis, bilateral: Secondary | ICD-10-CM | POA: Diagnosis not present

## 2020-04-29 DIAGNOSIS — H401132 Primary open-angle glaucoma, bilateral, moderate stage: Secondary | ICD-10-CM | POA: Diagnosis not present

## 2020-05-13 ENCOUNTER — Other Ambulatory Visit: Payer: Self-pay | Admitting: Family Medicine

## 2020-05-21 ENCOUNTER — Other Ambulatory Visit: Payer: Self-pay

## 2020-05-21 ENCOUNTER — Ambulatory Visit (INDEPENDENT_AMBULATORY_CARE_PROVIDER_SITE_OTHER): Payer: Medicare HMO | Admitting: Family Medicine

## 2020-05-21 ENCOUNTER — Encounter: Payer: Self-pay | Admitting: Family Medicine

## 2020-05-21 VITALS — BP 126/81 | HR 76 | Temp 98.4°F | Resp 16 | Ht 66.0 in | Wt 201.1 lb

## 2020-05-21 DIAGNOSIS — Z1231 Encounter for screening mammogram for malignant neoplasm of breast: Secondary | ICD-10-CM | POA: Diagnosis not present

## 2020-05-21 DIAGNOSIS — R251 Tremor, unspecified: Secondary | ICD-10-CM | POA: Insufficient documentation

## 2020-05-21 DIAGNOSIS — E785 Hyperlipidemia, unspecified: Secondary | ICD-10-CM

## 2020-05-21 DIAGNOSIS — E669 Obesity, unspecified: Secondary | ICD-10-CM

## 2020-05-21 DIAGNOSIS — E038 Other specified hypothyroidism: Secondary | ICD-10-CM

## 2020-05-21 HISTORY — DX: Tremor, unspecified: R25.1

## 2020-05-21 LAB — BASIC METABOLIC PANEL
BUN: 16 mg/dL (ref 6–23)
CO2: 27 mEq/L (ref 19–32)
Calcium: 9.6 mg/dL (ref 8.4–10.5)
Chloride: 103 mEq/L (ref 96–112)
Creatinine, Ser: 0.83 mg/dL (ref 0.40–1.20)
GFR: 68.27 mL/min (ref >60.00)
Glucose, Bld: 104 mg/dL — ABNORMAL HIGH (ref 70–99)
Potassium: 4.4 mEq/L (ref 3.5–5.1)
Sodium: 140 mEq/L (ref 135–145)

## 2020-05-21 LAB — HEPATIC FUNCTION PANEL
ALT: 33 U/L (ref 0–35)
AST: 29 U/L (ref 0–37)
Albumin: 4.5 g/dL (ref 3.5–5.2)
Alkaline Phosphatase: 90 U/L (ref 39–117)
Bilirubin, Direct: 0.1 mg/dL (ref 0.0–0.3)
Total Bilirubin: 0.7 mg/dL (ref 0.2–1.2)
Total Protein: 7.2 g/dL (ref 6.0–8.3)

## 2020-05-21 LAB — CBC WITH DIFFERENTIAL/PLATELET
Basophils Absolute: 0 10*3/uL (ref 0.0–0.1)
Basophils Relative: 0.7 % (ref 0.0–3.0)
Eosinophils Absolute: 0.1 10*3/uL (ref 0.0–0.7)
Eosinophils Relative: 1.4 % (ref 0.0–5.0)
HCT: 44.3 % (ref 36.0–46.0)
Hemoglobin: 14.8 g/dL (ref 12.0–15.0)
Lymphocytes Relative: 35.2 % (ref 12.0–46.0)
Lymphs Abs: 1.9 10*3/uL (ref 0.7–4.0)
MCHC: 33.5 g/dL (ref 30.0–36.0)
MCV: 89.8 fl (ref 78.0–100.0)
Monocytes Absolute: 0.4 10*3/uL (ref 0.1–1.0)
Monocytes Relative: 6.9 % (ref 3.0–12.0)
Neutro Abs: 3 10*3/uL (ref 1.4–7.7)
Neutrophils Relative %: 55.8 % (ref 43.0–77.0)
Platelets: 227 10*3/uL (ref 150.0–400.0)
RBC: 4.93 Mil/uL (ref 3.87–5.11)
RDW: 13.4 % (ref 11.5–15.5)
WBC: 5.3 10*3/uL (ref 4.0–10.5)

## 2020-05-21 LAB — LIPID PANEL
Cholesterol: 145 mg/dL (ref 0–200)
HDL: 54.3 mg/dL (ref >39.00)
LDL Cholesterol: 73 mg/dL (ref 0–99)
NonHDL: 90.84
Total CHOL/HDL Ratio: 3
Triglycerides: 90 mg/dL (ref 0.0–149.0)
VLDL: 18 mg/dL (ref 0.0–40.0)

## 2020-05-21 LAB — TSH: TSH: 1.25 u[IU]/mL (ref 0.35–4.50)

## 2020-05-21 NOTE — Assessment & Plan Note (Signed)
Chronic problem.  Tolerating statin w/o difficulty.  Check labs.  Adjust meds prn  

## 2020-05-21 NOTE — Patient Instructions (Addendum)
Schedule your complete physical in 6 months We'll notify you of your lab results and make any changes if needed Continue to work on healthy diet and regular exercise- you're doing great!! The order is in for the mammogram so you should be able to schedule We'll call you with your Neurology referral Call with any questions or concerns Stay Safe!  Stay Healthy!!

## 2020-05-21 NOTE — Assessment & Plan Note (Signed)
New to provider, ongoing for pt.  Not noted on exam today.  Since pt feels that tremor is worsening at times, will refer to neuro for complete evaluation and tx.  Pt expressed understanding and is in agreement w/ plan.

## 2020-05-21 NOTE — Progress Notes (Signed)
   Subjective:    Patient ID: [name] [name], female    DOB: [birth year], 69 y.o.   MRN: [id number]  HPI Hyperlipidemia- chronic problem, on Simvastatin 20mg  daily.  No CP.  Mild SOB w/ exertion.  Denies abd pain, N/V.  Hypothyroid- chronic problem, on NP thyroid 60mg  daily.  'I stay hot all the time'.  Hair loss has slowed but was pretty notable for 3-4 months.    Obesity- pt is down 7 lbs since Feb.  Pt has cut out snacking at night.  Is trying to eat better overall.  Walking regularly.  Tremor- pt would like referral as sometimes she feels tremor is more pronounced   Review of Systems For ROS see HPI   This visit occurred during the SARS-CoV-2 public health emergency.  Safety protocols were in place, including screening questions prior to the visit, additional usage of staff PPE, and extensive cleaning of exam room while observing appropriate contact time as indicated for disinfecting solutions.       Objective:   Physical Exam Vitals reviewed.  Constitutional:      General: She is not in acute distress.    Appearance: She is well-developed. She is obese.  HENT:     Head: Normocephalic and atraumatic.  Eyes:     Conjunctiva/sclera: Conjunctivae normal.     Pupils: Pupils are equal, round, and reactive to light.  Neck:     Thyroid: No thyromegaly.  Cardiovascular:     Rate and Rhythm: Normal rate and regular rhythm.     Heart sounds: Normal heart sounds. No murmur heard.   Pulmonary:     Effort: Pulmonary effort is normal. No respiratory distress.     Breath sounds: Normal breath sounds.  Abdominal:     General: There is no distension.     Palpations: Abdomen is soft.     Tenderness: There is no abdominal tenderness.  Musculoskeletal:     Cervical back: Normal range of motion and neck supple.  Lymphadenopathy:     Cervical: No cervical adenopathy.  Skin:    General: Skin is warm and dry.  Neurological:     Mental Status: She is alert and oriented to person, place,  and time.     Comments: No tremor noted today  Psychiatric:        Behavior: Behavior normal.           Assessment & Plan:

## 2020-05-21 NOTE — Assessment & Plan Note (Signed)
Chronic problem.  Currently having sxs of hyperthyroid- heat intolerance, hair loss, feeling shaky.  Check labs.  Adjust meds prn

## 2020-05-21 NOTE — Assessment & Plan Note (Signed)
Pt is down 7 lbs since last visit.  Applauded her efforts at diet and exercise.  Will continue to follow.

## 2020-05-26 ENCOUNTER — Other Ambulatory Visit: Payer: Self-pay | Admitting: Ophthalmology

## 2020-05-26 DIAGNOSIS — D3190 Benign neoplasm of unspecified part of unspecified eye: Secondary | ICD-10-CM

## 2020-05-27 NOTE — Progress Notes (Addendum)
Assessment/Plan:   1.  Tremor.  -potentially ET given fam hx but no signficant tremor seen on exam.  Because of that, I told her that I would not recommend medication right now.  I think that the risk : Benefit ratio is just not in her favor.    -pt has CT brain scheduled for 8/30 already by another physician.  We will try to get a copy of that.  2. Word finding trouble  -discussed neurocog testing.  She has opted to hold on that for now.  I did not notice any wound trouble today.  -check b12 3. ?VF cut  -Patient notes that this is the reason that ophthalmology is ordering CT of the brain.  Her ophthalmologist, Dr. Talbert Forest, is not within the [facility] system.  I will try to get a copy of his notes.  If he perhaps is thinking IIH then MRI/MRV brain may be of more benefit, but only if she actually had papilledema.  I am clearly missing a piece of the puzzle without notes.  As above, will try to get a copy of her CT of the brain.  Addendum: Addendum is dated June 02, 2020.  I received notes from the patient's ophthalmologist.  The notes were dated April 29, 2020.  On those notes it stated that there was no disc edema either in the right or left eye.  He did state that the left visual field was full and the right was unreliable and that the patient was supposed to follow-up for repeat visual field with the tech only.  Presumably, she did that and still had perhaps some abnormalities and that is why she had the CT scheduled.  Nonetheless, no papilledema was found on his exam.  4.  F/u will depend on #3.  Otherwise, f/u prn  Subjective:   Tristin L Gearing was seen in consultation in the movement disorder clinic at the request of Birdie Riddle Aundra Millet, MD.  The evaluation is for tremor.  Prior medical records made available to me are reviewed.  I reviewed notes from Dr. Rexene Alberts, who the patient saw for the same thing in 2014.  On that visit, Dr. Rexene Alberts noted that the patient did not have any notable tremor during the  visit, but felt that the patient likely had essential tremor.  Dr. Virgil Benedict recent exam also did not note any tremor  Tremor started approximately 10 years ago and "its not real bad."  It involves the bilateral UE.  It is intermittent.    Tremor is most noticeable when she is stressed.   There is a family hx of tremor in her mother in her hands and vocal tremor.    Affected by caffeine:  Doesn't know (drinks 1 can diet coke/tea) Affected by alcohol:  No. (2 glasses wine/night) Affected by stress:  Yes.   Affected by fatigue:  Yes.    Spills soup if on spoon:  No. ,she is R hand dominant Spills glass of liquid if full:  No. Affects ADL's (tying shoes, brushing teeth, etc):  No. (rare trouble with mascara)  Current/Previously tried tremor medications: n/a  Current medications that may exacerbate tremor:  n/a  Outside reports reviewed: historical medical records, office notes and referral letter/letters.  Patient has CT brain scheduled for August 30.  This was ordered by ophthalmology for VF change/VF cut.    Separate from tremor, she notes word finding trouble when singing in the car.    Allergies  Allergen Reactions  . Clindamycin/Lincomycin  Diarrhea  . Flagyl [Metronidazole Hcl] Diarrhea    Stomach upset. Pt states she CANNOT TAKE oral, has no problem with topical.   . Augmentin [Amoxicillin-Pot Clavulanate] Diarrhea  . Telithromycin Diarrhea    Current Outpatient Medications  Medication Instructions  . aspirin 325 mg, Oral, Daily  . B Complex-C (SUPER B COMPLEX PO) 1 tablet, Oral, Daily  . brimonidine (ALPHAGAN) 0.2 % ophthalmic solution 1 drop, 3 times daily  . cetirizine (ZYRTEC) 10 mg, Oral, Daily  . Coenzyme Q10 (COQ10) 200 MG CAPS 1 capsule, Oral, Daily  . doxycycline (ADOXA) 50 mg, Oral, Daily, For rosacea  . Magnesium 200 mg, Oral, 2 times daily  . Multiple Vitamins-Minerals (MULTIVITAMIN PO) 1 tablet, Daily  . NP THYROID 60 MG tablet TAKE 1 TABLET BY MOUTH ONCE DAILY  BEFORE BREAKFAST  . omeprazole (PRILOSEC) 20 mg, Oral, Daily  . simvastatin (ZOCOR) 20 mg, Oral, Daily at bedtime  . SYSTANE BALANCE 0.6 % SOLN SMARTSIG:1 Drop(s) In Eye(s) PRN  . Travoprost, BAK Free, (TRAVATAN) 0.004 % SOLN ophthalmic solution 1 drop, Both Eyes, Daily at bedtime  . Vitamin D3 5,000 Units, Oral, Daily     Objective:   VITALS:   Vitals:   05/28/20 1036  BP: 138/83  Pulse: (!) 106  SpO2: 98%  Weight: 203 lb (92.1 kg)  Height: 5\' 6"  (1.676 m)   Gen:  Appears stated age and in NAD. HEENT:  Normocephalic, atraumatic. The mucous membranes are moist. The superficial temporal arteries are without ropiness or tenderness. Cardiovascular: tachy.  regular Lungs: Clear to auscultation bilaterally. Neck: There are no carotid bruits noted bilaterally.  NEUROLOGICAL:  Orientation:  The patient is alert and oriented x 3.   Cranial nerves: There is good facial symmetry. Extraocular muscles are intact and visual fields are full to confrontational testing. Speech is fluent and clear. Soft palate rises symmetrically and there is no tongue deviation. Hearing is intact to conversational tone. Tone: Tone is good throughout. Sensation: Sensation is intact to light touch touch throughout (facial, trunk, extremities). Vibration is intact at the bilateral big toe. There is no extinction with double simultaneous stimulation. There is no sensory dermatomal level identified. Coordination:  The patient has no dysdiadichokinesia or dysmetria. Motor: Strength is 5/5 in the bilateral upper and lower extremities.  Shoulder shrug is equal bilaterally.  There is no pronator drift.  There are no fasciculations noted. DTR's: Deep tendon reflexes are 2-/4 at the bilateral biceps, triceps, brachioradialis, 1/4 at the bilateral patella and achilles.  Plantar responses are downgoing bilaterally. Gait and Station: The patient is able to ambulate without difficulty. The patient is able to heel toe walk  without any difficulty. The patient is able to ambulate in a tandem fashion. The patient is able to stand in the Romberg position.   MOVEMENT EXAM: Tremor:  There is no rest tremor.  No postural tremor.  No significant intention tremor.  No significant trouble with Archimedes spirals.  No significant trouble pouring water from 1 glass to another.  I have reviewed and interpreted the following labs independently   Chemistry      Component Value Date/Time   NA 140 05/21/2020 1121   NA 139 03/18/2015 0000   K 4.4 05/21/2020 1121   CL 103 05/21/2020 1121   CO2 27 05/21/2020 1121   BUN 16 05/21/2020 1121   BUN 19 03/18/2015 0000   CREATININE 0.83 05/21/2020 1121   CREATININE 0.74 02/18/2017 1552   GLU 93 03/18/2015 0000  Component Value Date/Time   CALCIUM 9.6 05/21/2020 1121   ALKPHOS 90 05/21/2020 1121   AST 29 05/21/2020 1121   ALT 33 05/21/2020 1121   BILITOT 0.7 05/21/2020 1121      Lab Results  Component Value Date   WBC 5.3 05/21/2020   HGB 14.8 05/21/2020   HCT 44.3 05/21/2020   MCV 89.8 05/21/2020   PLT 227.0 05/21/2020   Lab Results  Component Value Date   TSH 1.25 05/21/2020   No results found for: VITAMINB12    Total time spent on today's visit was 80minutes, including both face-to-face time and nonface-to-face time.  Time included that spent on review of records (prior notes available to me/labs/imaging if pertinent), discussing treatment and goals, answering patient's questions and coordinating care.  CC:  Midge Minium, MD

## 2020-05-28 ENCOUNTER — Ambulatory Visit: Payer: Medicare HMO | Admitting: Neurology

## 2020-05-28 ENCOUNTER — Other Ambulatory Visit: Payer: Self-pay

## 2020-05-28 ENCOUNTER — Encounter: Payer: Self-pay | Admitting: Neurology

## 2020-05-28 ENCOUNTER — Other Ambulatory Visit (INDEPENDENT_AMBULATORY_CARE_PROVIDER_SITE_OTHER): Payer: Medicare HMO

## 2020-05-28 VITALS — BP 138/83 | HR 106 | Ht 66.0 in | Wt 203.0 lb

## 2020-05-28 DIAGNOSIS — R251 Tremor, unspecified: Secondary | ICD-10-CM | POA: Diagnosis not present

## 2020-05-28 DIAGNOSIS — R413 Other amnesia: Secondary | ICD-10-CM | POA: Diagnosis not present

## 2020-05-28 LAB — VITAMIN B12: Vitamin B-12: 639 pg/mL (ref 211–911)

## 2020-05-28 NOTE — Patient Instructions (Signed)
We will get a copy of your ophthalmology records  Your provider has requested that you have labwork completed today. Please go to [facility] ([phone]) on the second floor of this building before leaving the office today. You do not need to check in. If you are not called within 15 minutes please check with the front desk.   The physicians and staff at [facility] are committed to providing excellent care. You may receive a survey requesting feedback about your experience at our office. We strive to receive "very good" responses to the survey questions. If you feel that your experience would prevent you from giving the office a "very good " response, please contact our office to try to remedy the situation. We may be reached at [phone]. Thank you for taking the time out of your busy day to complete the survey.

## 2020-06-02 ENCOUNTER — Ambulatory Visit
Admission: RE | Admit: 2020-06-02 | Discharge: 2020-06-02 | Disposition: A | Discharge status: Home / Self Care | Payer: Medicare HMO | Source: Ambulatory Visit | Attending: Ophthalmology | Admitting: Ophthalmology

## 2020-06-02 ENCOUNTER — Other Ambulatory Visit: Payer: Self-pay

## 2020-06-02 DIAGNOSIS — D3190 Benign neoplasm of unspecified part of unspecified eye: Secondary | ICD-10-CM

## 2020-06-02 DIAGNOSIS — H53131 Sudden visual loss, right eye: Secondary | ICD-10-CM | POA: Diagnosis not present

## 2020-06-02 DIAGNOSIS — G319 Degenerative disease of nervous system, unspecified: Secondary | ICD-10-CM | POA: Diagnosis not present

## 2020-06-02 DIAGNOSIS — D3160 Benign neoplasm of unspecified site of unspecified orbit: Secondary | ICD-10-CM | POA: Diagnosis not present

## 2020-06-04 ENCOUNTER — Telehealth: Payer: Self-pay | Admitting: Neurology

## 2020-06-04 NOTE — Telephone Encounter (Signed)
Left detailed message with results, ok per DPR, and to call back if any questions.  

## 2020-06-04 NOTE — Telephone Encounter (Signed)
Reviewed CT brain and unremarkable.  Let pt know that I looked at CT brain (was ordered by ophthalmology for different problem than what I see her for).  Looked good.  Her ophth can let us know if he thinks that she needs [place] in that regard but don't think so based on his notes

## 2020-06-17 DIAGNOSIS — S46011A Strain of muscle(s) and tendon(s) of the rotator cuff of right shoulder, initial encounter: Secondary | ICD-10-CM | POA: Diagnosis not present

## 2020-06-19 DIAGNOSIS — M25311 Other instability, right shoulder: Secondary | ICD-10-CM | POA: Diagnosis not present

## 2020-06-19 DIAGNOSIS — M79621 Pain in right upper arm: Secondary | ICD-10-CM | POA: Diagnosis not present

## 2020-06-19 DIAGNOSIS — M6281 Muscle weakness (generalized): Secondary | ICD-10-CM | POA: Diagnosis not present

## 2020-06-19 DIAGNOSIS — M25511 Pain in right shoulder: Secondary | ICD-10-CM | POA: Diagnosis not present

## 2020-06-23 DIAGNOSIS — M25511 Pain in right shoulder: Secondary | ICD-10-CM | POA: Diagnosis not present

## 2020-06-23 DIAGNOSIS — M79621 Pain in right upper arm: Secondary | ICD-10-CM | POA: Diagnosis not present

## 2020-06-23 DIAGNOSIS — M6281 Muscle weakness (generalized): Secondary | ICD-10-CM | POA: Diagnosis not present

## 2020-06-23 DIAGNOSIS — M25311 Other instability, right shoulder: Secondary | ICD-10-CM | POA: Diagnosis not present

## 2020-06-26 DIAGNOSIS — M25311 Other instability, right shoulder: Secondary | ICD-10-CM | POA: Diagnosis not present

## 2020-06-26 DIAGNOSIS — M25511 Pain in right shoulder: Secondary | ICD-10-CM | POA: Diagnosis not present

## 2020-06-26 DIAGNOSIS — M79621 Pain in right upper arm: Secondary | ICD-10-CM | POA: Diagnosis not present

## 2020-06-26 DIAGNOSIS — M6281 Muscle weakness (generalized): Secondary | ICD-10-CM | POA: Diagnosis not present

## 2020-07-01 DIAGNOSIS — M79621 Pain in right upper arm: Secondary | ICD-10-CM | POA: Diagnosis not present

## 2020-07-01 DIAGNOSIS — M6281 Muscle weakness (generalized): Secondary | ICD-10-CM | POA: Diagnosis not present

## 2020-07-01 DIAGNOSIS — M25311 Other instability, right shoulder: Secondary | ICD-10-CM | POA: Diagnosis not present

## 2020-07-01 DIAGNOSIS — M25511 Pain in right shoulder: Secondary | ICD-10-CM | POA: Diagnosis not present

## 2020-08-01 DIAGNOSIS — M6281 Muscle weakness (generalized): Secondary | ICD-10-CM | POA: Diagnosis not present

## 2020-08-01 DIAGNOSIS — M25511 Pain in right shoulder: Secondary | ICD-10-CM | POA: Diagnosis not present

## 2020-08-01 DIAGNOSIS — M25311 Other instability, right shoulder: Secondary | ICD-10-CM | POA: Diagnosis not present

## 2020-08-01 DIAGNOSIS — M79621 Pain in right upper arm: Secondary | ICD-10-CM | POA: Diagnosis not present

## 2020-08-04 DIAGNOSIS — Z808 Family history of malignant neoplasm of other organs or systems: Secondary | ICD-10-CM | POA: Diagnosis not present

## 2020-08-04 DIAGNOSIS — Z1231 Encounter for screening mammogram for malignant neoplasm of breast: Secondary | ICD-10-CM | POA: Diagnosis not present

## 2020-08-08 DIAGNOSIS — M25311 Other instability, right shoulder: Secondary | ICD-10-CM | POA: Diagnosis not present

## 2020-08-08 DIAGNOSIS — M6281 Muscle weakness (generalized): Secondary | ICD-10-CM | POA: Diagnosis not present

## 2020-08-08 DIAGNOSIS — M79621 Pain in right upper arm: Secondary | ICD-10-CM | POA: Diagnosis not present

## 2020-08-08 DIAGNOSIS — M25511 Pain in right shoulder: Secondary | ICD-10-CM | POA: Diagnosis not present

## 2020-08-11 DIAGNOSIS — M6281 Muscle weakness (generalized): Secondary | ICD-10-CM | POA: Diagnosis not present

## 2020-08-11 DIAGNOSIS — M25511 Pain in right shoulder: Secondary | ICD-10-CM | POA: Diagnosis not present

## 2020-08-11 DIAGNOSIS — M79621 Pain in right upper arm: Secondary | ICD-10-CM | POA: Diagnosis not present

## 2020-08-11 DIAGNOSIS — M25311 Other instability, right shoulder: Secondary | ICD-10-CM | POA: Diagnosis not present

## 2020-08-13 ENCOUNTER — Other Ambulatory Visit: Payer: Self-pay | Admitting: Family Medicine

## 2020-08-13 DIAGNOSIS — M6281 Muscle weakness (generalized): Secondary | ICD-10-CM | POA: Diagnosis not present

## 2020-08-13 DIAGNOSIS — M25511 Pain in right shoulder: Secondary | ICD-10-CM | POA: Diagnosis not present

## 2020-08-13 DIAGNOSIS — M79621 Pain in right upper arm: Secondary | ICD-10-CM | POA: Diagnosis not present

## 2020-08-13 DIAGNOSIS — M25311 Other instability, right shoulder: Secondary | ICD-10-CM | POA: Diagnosis not present

## 2020-08-14 LAB — HM MAMMOGRAPHY

## 2020-08-26 DIAGNOSIS — M25561 Pain in right knee: Secondary | ICD-10-CM | POA: Diagnosis not present

## 2020-08-27 DIAGNOSIS — M25511 Pain in right shoulder: Secondary | ICD-10-CM | POA: Diagnosis not present

## 2020-08-27 DIAGNOSIS — M25311 Other instability, right shoulder: Secondary | ICD-10-CM | POA: Diagnosis not present

## 2020-08-27 DIAGNOSIS — M79621 Pain in right upper arm: Secondary | ICD-10-CM | POA: Diagnosis not present

## 2020-08-27 DIAGNOSIS — M6281 Muscle weakness (generalized): Secondary | ICD-10-CM | POA: Diagnosis not present

## 2020-08-28 ENCOUNTER — Telehealth: Payer: Medicare HMO | Admitting: Family

## 2020-08-28 DIAGNOSIS — J069 Acute upper respiratory infection, unspecified: Secondary | ICD-10-CM

## 2020-08-28 MED ORDER — BENZONATATE 100 MG PO CAPS: 100.0000 mg | ORAL_CAPSULE | Freq: Three times a day (TID) | ORAL | 0 refills | Status: DC | PRN

## 2020-08-28 MED ORDER — FLUTICASONE PROPIONATE 50 MCG/ACT NA SUSP: 2.0000 | Freq: Every day | NASAL | 6 refills | Status: DC

## 2020-08-28 NOTE — Progress Notes (Signed)

## 2020-08-28 NOTE — Addendum Note (Signed)
Addended by: Evelina Dun A on: 08/28/2020 11:35 AM   Modules accepted: Orders

## 2020-08-29 ENCOUNTER — Encounter: Payer: Self-pay | Admitting: Family Medicine

## 2020-09-01 ENCOUNTER — Other Ambulatory Visit: Payer: Self-pay

## 2020-09-01 ENCOUNTER — Telehealth (INDEPENDENT_AMBULATORY_CARE_PROVIDER_SITE_OTHER): Payer: Medicare HMO | Admitting: Physician Assistant

## 2020-09-01 ENCOUNTER — Encounter: Payer: Self-pay | Admitting: Physician Assistant

## 2020-09-01 DIAGNOSIS — M79621 Pain in right upper arm: Secondary | ICD-10-CM | POA: Diagnosis not present

## 2020-09-01 DIAGNOSIS — M6281 Muscle weakness (generalized): Secondary | ICD-10-CM | POA: Diagnosis not present

## 2020-09-01 DIAGNOSIS — J019 Acute sinusitis, unspecified: Secondary | ICD-10-CM

## 2020-09-01 DIAGNOSIS — B9689 Other specified bacterial agents as the cause of diseases classified elsewhere: Secondary | ICD-10-CM | POA: Diagnosis not present

## 2020-09-01 DIAGNOSIS — M25511 Pain in right shoulder: Secondary | ICD-10-CM | POA: Diagnosis not present

## 2020-09-01 DIAGNOSIS — M25311 Other instability, right shoulder: Secondary | ICD-10-CM | POA: Diagnosis not present

## 2020-09-01 MED ORDER — DOXYCYCLINE HYCLATE 100 MG PO CAPS: 100.0000 mg | ORAL_CAPSULE | Freq: Two times a day (BID) | ORAL | 0 refills | Status: DC

## 2020-09-01 NOTE — Patient Instructions (Signed)
Instructions sent to MyChart

## 2020-09-01 NOTE — Progress Notes (Signed)
Virtual Visit via Video   I connected with patient on 09/01/20 at  2:30 PM EST by a video enabled telemedicine application and verified that I am speaking with the correct person using two identifiers.  Location patient: Home Location provider: [facility], Office Persons participating in the virtual visit: Patient, Provider, [place] (Patina Moore)  I discussed the limitations of evaluation and management by telemedicine and the availability of in person appointments. The patient expressed understanding and agreed to proceed.  Subjective:   HPI:   Patient presents via [facility] today c/o 1 week of URI symptoms starting with mid sore throat and drainage followed by cough and sinus pressure. Denies fever, chills, body ache. Notes thick and colored nasal mucous with odor. Some sinus pain and R ear pain. Is taking Mucinex and Coricidin. Notes persistent, dry cough. Has had some mild chest tightness during coughing spells only. This has improved greatly from onset of symptoms. Did note chest wall tenderness with coughing but resolved in past couple of days. Denies recent travel. Had been around son last weekend who had a mild cold. She and her husband have had covid tests after being around her son and then again when her symptoms started. All tests were negative.   ROS:   See pertinent positives and negatives per HPI.  Patient Active Problem List   Diagnosis Date Noted  . Tremor of both hands 05/21/2020  . Incontinence of feces   . Bloating 08/22/2019  . Fecal soiling 08/22/2019  . Obesity (BMI 30-39.9) 11/15/2018  . Anxiety about health 05/17/2018  . OSA (obstructive sleep apnea) 12/27/2017  . Hx of adenomatous colonic polyps 01/17/2017  . Postoperative nausea and vomiting 09/02/2015  . Primary localized osteoarthritis of left knee   . IBS (irritable bowel syndrome)   . Physical exam 05/30/2015  . Hypothyroidism 11/22/2014  . Vitamin D deficiency 09/21/2009  .  Hyperlipidemia 09/21/2009  . SKIN CANCER, HX OF 09/15/2009  . Anxiety state 12/19/2007  . GERD 12/19/2007  . ALLERGY 12/19/2007    Social History   Tobacco Use  . Smoking status: Former Smoker    Years: 6.00    Quit date: 10/05/1975    Years since quitting: 44.9  . Smokeless tobacco: Never Used  Substance Use Topics  . Alcohol use: Yes    Alcohol/week: 14.0 standard drinks    Types: 12 Standard drinks or equivalent, 2 Glasses of wine per week    Comment: socially    Current Outpatient Medications:  .  aspirin 325 MG tablet, Take 325 mg by mouth daily., Disp: , Rfl:  .  B Complex-C (SUPER B COMPLEX PO), Take 1 tablet by mouth daily. , Disp: , Rfl:  .  benzonatate (TESSALON PERLES) 100 MG capsule, Take 1 capsule (100 mg total) by mouth 3 (three) times daily as needed., Disp: 20 capsule, Rfl: 0 .  brimonidine (ALPHAGAN) 0.2 % ophthalmic solution, 1 drop 3 (three) times daily., Disp: , Rfl:  .  cetirizine (ZYRTEC) 10 MG tablet, Take 10 mg by mouth daily., Disp: , Rfl:  .  Cholecalciferol (VITAMIN D3) 5000 UNITS CAPS, Take 5,000 Units by mouth daily. , Disp: , Rfl:  .  Coenzyme Q10 (COQ10) 200 MG CAPS, Take 1 capsule by mouth daily. , Disp: , Rfl:  .  doxycycline (ADOXA) 50 MG tablet, Take 50 mg by mouth daily. For rosacea, Disp: , Rfl:  .  fluticasone (FLONASE) 50 MCG/ACT nasal spray, Place 2 sprays into both nostrils daily., Disp: 16  g, Rfl: 6 .  Magnesium 200 MG TABS, Take 200 mg by mouth in the morning and at bedtime. , Disp: , Rfl:  .  Multiple Vitamins-Minerals (MULTIVITAMIN PO), Take 1 tablet by mouth daily., Disp: , Rfl:  .  NP THYROID 60 MG tablet, TAKE 1 TABLET BY MOUTH ONCE DAILY BEFORE BREAKFAST, Disp: 90 tablet, Rfl: 0 .  omeprazole (PRILOSEC) 20 MG capsule, TAKE 1 CAPSULE (20 MG TOTAL) BY MOUTH DAILY., Disp: 90 capsule, Rfl: 1 .  simvastatin (ZOCOR) 20 MG tablet, TAKE 1 TABLET (20 MG TOTAL) BY MOUTH AT BEDTIME., Disp: 90 tablet, Rfl: 1 .  SYSTANE BALANCE 0.6 % SOLN,  SMARTSIG:1 Drop(s) In Eye(s) PRN, Disp: , Rfl:  .  Travoprost, BAK Free, (TRAVATAN) 0.004 % SOLN ophthalmic solution, Place 1 drop into both eyes at bedtime. , Disp: , Rfl:   Allergies  Allergen Reactions  . Clindamycin/Lincomycin Diarrhea  . Flagyl [Metronidazole Hcl] Diarrhea    Stomach upset. Pt states she CANNOT TAKE oral, has no problem with topical.   . Augmentin [Amoxicillin-Pot Clavulanate] Diarrhea  . Telithromycin Diarrhea    Objective:   There were no vitals taken for this visit.  Patient is well-developed, well-nourished in no acute distress.  Resting comfortably at home.  Head is normocephalic, atraumatic.  No labored breathing.  Speech is clear and coherent with logical content.  Patient is alert and oriented at baseline.   Assessment and Plan:   1. Acute bacterial sinusitis Rx doxycycline.  Increase fluids.  Rest.  Saline nasal spray.  Probiotic.  Mucinex as directed.  Humidifier in bedroom. Continue Coricidin and Tessalon.  Call or return to clinic if symptoms are not improving.     Leeanne Rio, PA-C 09/01/2020

## 2020-09-01 NOTE — Progress Notes (Signed)
I have discussed the procedure for the virtual visit with the patient who has given consent to proceed with assessment and treatment.   [name] [name] S Daaiel Starlin, CMA     

## 2020-09-03 DIAGNOSIS — M79621 Pain in right upper arm: Secondary | ICD-10-CM | POA: Diagnosis not present

## 2020-09-03 DIAGNOSIS — M6281 Muscle weakness (generalized): Secondary | ICD-10-CM | POA: Diagnosis not present

## 2020-09-03 DIAGNOSIS — M25311 Other instability, right shoulder: Secondary | ICD-10-CM | POA: Diagnosis not present

## 2020-09-03 DIAGNOSIS — M25511 Pain in right shoulder: Secondary | ICD-10-CM | POA: Diagnosis not present

## 2020-09-05 ENCOUNTER — Encounter: Payer: Self-pay | Admitting: Family Medicine

## 2020-09-11 DIAGNOSIS — M25511 Pain in right shoulder: Secondary | ICD-10-CM | POA: Diagnosis not present

## 2020-09-11 DIAGNOSIS — M25311 Other instability, right shoulder: Secondary | ICD-10-CM | POA: Diagnosis not present

## 2020-09-11 DIAGNOSIS — M79621 Pain in right upper arm: Secondary | ICD-10-CM | POA: Diagnosis not present

## 2020-09-11 DIAGNOSIS — M6281 Muscle weakness (generalized): Secondary | ICD-10-CM | POA: Diagnosis not present

## 2020-09-16 DIAGNOSIS — M25561 Pain in right knee: Secondary | ICD-10-CM | POA: Diagnosis not present

## 2020-09-24 DIAGNOSIS — Z85820 Personal history of malignant melanoma of skin: Secondary | ICD-10-CM | POA: Diagnosis not present

## 2020-09-24 DIAGNOSIS — Z0189 Encounter for other specified special examinations: Secondary | ICD-10-CM | POA: Diagnosis not present

## 2020-09-24 DIAGNOSIS — D485 Neoplasm of uncertain behavior of skin: Secondary | ICD-10-CM | POA: Diagnosis not present

## 2020-09-24 DIAGNOSIS — L820 Inflamed seborrheic keratosis: Secondary | ICD-10-CM | POA: Diagnosis not present

## 2020-09-24 DIAGNOSIS — Z85828 Personal history of other malignant neoplasm of skin: Secondary | ICD-10-CM | POA: Diagnosis not present

## 2020-09-24 DIAGNOSIS — C4441 Basal cell carcinoma of skin of scalp and neck: Secondary | ICD-10-CM | POA: Diagnosis not present

## 2020-09-24 DIAGNOSIS — Z859 Personal history of malignant neoplasm, unspecified: Secondary | ICD-10-CM | POA: Diagnosis not present

## 2020-09-24 DIAGNOSIS — C434 Malignant melanoma of scalp and neck: Secondary | ICD-10-CM | POA: Diagnosis not present

## 2020-09-24 DIAGNOSIS — L578 Other skin changes due to chronic exposure to nonionizing radiation: Secondary | ICD-10-CM | POA: Diagnosis not present

## 2020-10-08 DIAGNOSIS — Z881 Allergy status to other antibiotic agents status: Secondary | ICD-10-CM | POA: Diagnosis not present

## 2020-10-08 DIAGNOSIS — Z880 Allergy status to penicillin: Secondary | ICD-10-CM | POA: Diagnosis not present

## 2020-10-08 DIAGNOSIS — C434 Malignant melanoma of scalp and neck: Secondary | ICD-10-CM | POA: Diagnosis not present

## 2020-10-09 DIAGNOSIS — C434 Malignant melanoma of scalp and neck: Secondary | ICD-10-CM | POA: Insufficient documentation

## 2020-10-16 ENCOUNTER — Other Ambulatory Visit: Payer: Self-pay | Admitting: Family Medicine

## 2020-10-16 DIAGNOSIS — R918 Other nonspecific abnormal finding of lung field: Secondary | ICD-10-CM | POA: Diagnosis not present

## 2020-10-16 DIAGNOSIS — C434 Malignant melanoma of scalp and neck: Secondary | ICD-10-CM | POA: Diagnosis not present

## 2020-10-17 DIAGNOSIS — H401132 Primary open-angle glaucoma, bilateral, moderate stage: Secondary | ICD-10-CM | POA: Diagnosis not present

## 2020-10-17 DIAGNOSIS — Z961 Presence of intraocular lens: Secondary | ICD-10-CM | POA: Diagnosis not present

## 2020-10-17 DIAGNOSIS — H43813 Vitreous degeneration, bilateral: Secondary | ICD-10-CM | POA: Diagnosis not present

## 2020-10-17 DIAGNOSIS — H18413 Arcus senilis, bilateral: Secondary | ICD-10-CM | POA: Diagnosis not present

## 2020-10-27 DIAGNOSIS — C434 Malignant melanoma of scalp and neck: Secondary | ICD-10-CM | POA: Diagnosis not present

## 2020-10-30 DIAGNOSIS — E669 Obesity, unspecified: Secondary | ICD-10-CM | POA: Diagnosis not present

## 2020-10-30 DIAGNOSIS — C434 Malignant melanoma of scalp and neck: Secondary | ICD-10-CM | POA: Diagnosis not present

## 2020-10-30 DIAGNOSIS — K219 Gastro-esophageal reflux disease without esophagitis: Secondary | ICD-10-CM | POA: Diagnosis not present

## 2020-10-30 DIAGNOSIS — E039 Hypothyroidism, unspecified: Secondary | ICD-10-CM | POA: Diagnosis not present

## 2020-10-30 DIAGNOSIS — C4441 Basal cell carcinoma of skin of scalp and neck: Secondary | ICD-10-CM | POA: Diagnosis not present

## 2020-10-30 DIAGNOSIS — Z6831 Body mass index (BMI) 31.0-31.9, adult: Secondary | ICD-10-CM | POA: Diagnosis not present

## 2020-10-30 DIAGNOSIS — E785 Hyperlipidemia, unspecified: Secondary | ICD-10-CM | POA: Diagnosis not present

## 2020-10-30 DIAGNOSIS — K589 Irritable bowel syndrome without diarrhea: Secondary | ICD-10-CM | POA: Diagnosis not present

## 2020-10-30 DIAGNOSIS — G4733 Obstructive sleep apnea (adult) (pediatric): Secondary | ICD-10-CM | POA: Diagnosis not present

## 2020-11-06 DIAGNOSIS — C4441 Basal cell carcinoma of skin of scalp and neck: Secondary | ICD-10-CM | POA: Diagnosis not present

## 2020-11-06 DIAGNOSIS — K219 Gastro-esophageal reflux disease without esophagitis: Secondary | ICD-10-CM | POA: Diagnosis not present

## 2020-11-06 DIAGNOSIS — L905 Scar conditions and fibrosis of skin: Secondary | ICD-10-CM | POA: Diagnosis not present

## 2020-11-06 DIAGNOSIS — Z6831 Body mass index (BMI) 31.0-31.9, adult: Secondary | ICD-10-CM | POA: Diagnosis not present

## 2020-11-06 DIAGNOSIS — E039 Hypothyroidism, unspecified: Secondary | ICD-10-CM | POA: Diagnosis not present

## 2020-11-06 DIAGNOSIS — K589 Irritable bowel syndrome without diarrhea: Secondary | ICD-10-CM | POA: Diagnosis not present

## 2020-11-06 DIAGNOSIS — E785 Hyperlipidemia, unspecified: Secondary | ICD-10-CM | POA: Diagnosis not present

## 2020-11-06 DIAGNOSIS — E669 Obesity, unspecified: Secondary | ICD-10-CM | POA: Diagnosis not present

## 2020-11-06 DIAGNOSIS — G4733 Obstructive sleep apnea (adult) (pediatric): Secondary | ICD-10-CM | POA: Diagnosis not present

## 2020-11-06 DIAGNOSIS — C434 Malignant melanoma of scalp and neck: Secondary | ICD-10-CM | POA: Diagnosis not present

## 2020-11-07 DIAGNOSIS — E785 Hyperlipidemia, unspecified: Secondary | ICD-10-CM | POA: Diagnosis not present

## 2020-11-07 DIAGNOSIS — E039 Hypothyroidism, unspecified: Secondary | ICD-10-CM | POA: Diagnosis not present

## 2020-11-07 DIAGNOSIS — K589 Irritable bowel syndrome without diarrhea: Secondary | ICD-10-CM | POA: Diagnosis not present

## 2020-11-07 DIAGNOSIS — C4441 Basal cell carcinoma of skin of scalp and neck: Secondary | ICD-10-CM | POA: Diagnosis not present

## 2020-11-07 DIAGNOSIS — K219 Gastro-esophageal reflux disease without esophagitis: Secondary | ICD-10-CM | POA: Diagnosis not present

## 2020-11-07 DIAGNOSIS — Z6831 Body mass index (BMI) 31.0-31.9, adult: Secondary | ICD-10-CM | POA: Diagnosis not present

## 2020-11-07 DIAGNOSIS — E669 Obesity, unspecified: Secondary | ICD-10-CM | POA: Diagnosis not present

## 2020-11-07 DIAGNOSIS — C434 Malignant melanoma of scalp and neck: Secondary | ICD-10-CM | POA: Diagnosis not present

## 2020-11-07 DIAGNOSIS — G4733 Obstructive sleep apnea (adult) (pediatric): Secondary | ICD-10-CM | POA: Diagnosis not present

## 2020-11-10 DIAGNOSIS — C4441 Basal cell carcinoma of skin of scalp and neck: Secondary | ICD-10-CM | POA: Diagnosis not present

## 2020-11-10 DIAGNOSIS — Z483 Aftercare following surgery for neoplasm: Secondary | ICD-10-CM | POA: Diagnosis not present

## 2020-11-17 DIAGNOSIS — C434 Malignant melanoma of scalp and neck: Secondary | ICD-10-CM | POA: Diagnosis not present

## 2020-11-17 DIAGNOSIS — Z483 Aftercare following surgery for neoplasm: Secondary | ICD-10-CM | POA: Diagnosis not present

## 2020-11-17 DIAGNOSIS — R918 Other nonspecific abnormal finding of lung field: Secondary | ICD-10-CM | POA: Diagnosis not present

## 2020-11-20 ENCOUNTER — Other Ambulatory Visit: Payer: Self-pay

## 2020-11-20 ENCOUNTER — Ambulatory Visit (INDEPENDENT_AMBULATORY_CARE_PROVIDER_SITE_OTHER): Payer: Medicare HMO | Admitting: Family Medicine

## 2020-11-20 ENCOUNTER — Encounter: Payer: Self-pay | Admitting: Family Medicine

## 2020-11-20 VITALS — BP 120/72 | HR 69 | Temp 97.3°F | Ht 66.0 in | Wt 188.0 lb

## 2020-11-20 DIAGNOSIS — Z0000 Encounter for general adult medical examination without abnormal findings: Secondary | ICD-10-CM

## 2020-11-20 DIAGNOSIS — E559 Vitamin D deficiency, unspecified: Secondary | ICD-10-CM

## 2020-11-20 DIAGNOSIS — E669 Obesity, unspecified: Secondary | ICD-10-CM | POA: Diagnosis not present

## 2020-11-20 LAB — HEPATIC FUNCTION PANEL
ALT: 26 U/L (ref 0–35)
AST: 24 U/L (ref 0–37)
Albumin: 4.2 g/dL (ref 3.5–5.2)
Alkaline Phosphatase: 80 U/L (ref 39–117)
Bilirubin, Direct: 0.1 mg/dL (ref 0.0–0.3)
Total Bilirubin: 0.7 mg/dL (ref 0.2–1.2)
Total Protein: 7.1 g/dL (ref 6.0–8.3)

## 2020-11-20 LAB — LIPID PANEL
Cholesterol: 167 mg/dL (ref 0–200)
HDL: 58.6 mg/dL (ref >39.00)
LDL Cholesterol: 76 mg/dL (ref 0–99)
NonHDL: 108.54
Total CHOL/HDL Ratio: 3
Triglycerides: 165 mg/dL — ABNORMAL HIGH (ref 0.0–149.0)
VLDL: 33 mg/dL (ref 0.0–40.0)

## 2020-11-20 LAB — CBC WITH DIFFERENTIAL/PLATELET
Basophils Absolute: 0.2 10*3/uL — ABNORMAL HIGH (ref 0.0–0.1)
Basophils Relative: 2 % (ref 0.0–3.0)
Eosinophils Absolute: 0.5 10*3/uL (ref 0.0–0.7)
Eosinophils Relative: 6.4 % — ABNORMAL HIGH (ref 0.0–5.0)
HCT: 38.6 % (ref 36.0–46.0)
Hemoglobin: 12.9 g/dL (ref 12.0–15.0)
Lymphocytes Relative: 23.6 % (ref 12.0–46.0)
Lymphs Abs: 1.8 10*3/uL (ref 0.7–4.0)
MCHC: 33.5 g/dL (ref 30.0–36.0)
MCV: 77.3 fl — ABNORMAL LOW (ref 78.0–100.0)
Monocytes Absolute: 0.4 10*3/uL (ref 0.1–1.0)
Monocytes Relative: 4.8 % (ref 3.0–12.0)
Neutro Abs: 4.9 10*3/uL (ref 1.4–7.7)
Neutrophils Relative %: 63.2 % (ref 43.0–77.0)
Platelets: 339 10*3/uL (ref 150.0–400.0)
RBC: 5 Mil/uL (ref 3.87–5.11)
RDW: 17.1 % — ABNORMAL HIGH (ref 11.5–15.5)
WBC: 7.7 10*3/uL (ref 4.0–10.5)

## 2020-11-20 LAB — VITAMIN D 25 HYDROXY (VIT D DEFICIENCY, FRACTURES): VITD: 52 ng/mL (ref 30.00–100.00)

## 2020-11-20 LAB — BASIC METABOLIC PANEL
BUN: 19 mg/dL (ref 6–23)
CO2: 29 mEq/L (ref 19–32)
Calcium: 9.7 mg/dL (ref 8.4–10.5)
Chloride: 103 mEq/L (ref 96–112)
Creatinine, Ser: 0.85 mg/dL (ref 0.40–1.20)
GFR: 70.12 mL/min (ref >60.00)
Glucose, Bld: 104 mg/dL — ABNORMAL HIGH (ref 70–99)
Potassium: 3.9 mEq/L (ref 3.5–5.1)
Sodium: 139 mEq/L (ref 135–145)

## 2020-11-20 LAB — TSH: TSH: 1.27 u[IU]/mL (ref 0.35–4.50)

## 2020-11-20 NOTE — Assessment & Plan Note (Signed)
Check labs and replete prn. 

## 2020-11-20 NOTE — Assessment & Plan Note (Signed)
Pt's PE WNL w/ exception of obesity which she has been working on.  UTD on colonoscopy, mammo, flu, COVID, Prevnar.  We forgot to give Pneumovax at end of visit so will update at her 6 month f/u.  Check labs.  Anticipatory guidance provided.

## 2020-11-20 NOTE — Assessment & Plan Note (Signed)
Pt is down 15 lbs since last visit.  Applauded her efforts.  Will continue to follow.  Check labs to risk stratify.

## 2020-11-20 NOTE — Patient Instructions (Signed)
Follow up in 6 months to recheck cholesterol We'll notify you of your lab results and make any changes if needed Continue to work on healthy diet and regular exercise- you look great!!! Call with any questions or concerns Stay Safe!  Stay Healthy!! 

## 2020-11-20 NOTE — Progress Notes (Signed)
Subjective:    Patient ID: [name] [name], female    DOB: 04-19-1951, 70 y.o.   MRN: [id number]  HPI CPE- UTD on colonoscopy, mammo, flu, COVID.  Has had Prevnar.  Due for Pneumovax.  Reviewed past medical, surgical, family and social histories.   Patient Care Team    Relationship Specialty Notifications Start End  Midge Minium, MD PCP - General Family Medicine  11/22/14   Roque Cash., MD Consulting Physician Obstetrics and Gynecology  05/29/15   Hollar, Katharine Look, MD Referring Physician Dermatology  05/29/15   Elsie Saas, MD Consulting Physician Orthopedic Surgery  05/29/15   Wallene Huh, [place] Consulting Physician Podiatry  05/29/15   Janann August, MD Referring Physician Dermatology  11/09/17   Darleen Crocker, MD Consulting Physician Ophthalmology  11/15/18   Madelin Rear, [facility] Pharmacist Pharmacist  01/24/20    Comment: PHONE NUMBER [phone]    Health Maintenance  Topic Date Due  . PNA vac Low Risk Adult (2 of 2 - PPSV23) 05/18/2019  . COVID-19 Vaccine (4 - Booster for Pfizer series) 12/17/2020  . MAMMOGRAM  08/14/2021  . TETANUS/TDAP  09/30/2021  . COLONOSCOPY (Pts 45-80yrs Insurance coverage will need to be confirmed)  03/29/2022  . INFLUENZA VACCINE  Completed  . DEXA SCAN  Completed  . Hepatitis C Screening  Completed      Review of Systems Patient reports no vision/ hearing changes, adenopathy,fever, persistant/recurrent hoarseness , swallowing issues, chest pain, palpitations, edema, persistant/recurrent cough, hemoptysis, dyspnea (rest/exertional/paroxysmal nocturnal), gastrointestinal bleeding (melena, rectal bleeding), abdominal pain, significant heartburn, bowel changes, GU symptoms (dysuria, hematuria, incontinence), Gyn symptoms (abnormal  bleeding, pain),  syncope, focal weakness, memory loss, numbness & tingling, hair/nail changes, abnormal bruising or bleeding, anxiety, or depression.   + 15 lb weight loss + melanoma excision w/ partial  neck dissection  This visit occurred during the SARS-CoV-2 public health emergency.  Safety protocols were in place, including screening questions prior to the visit, additional usage of staff PPE, and extensive cleaning of exam room while observing appropriate contact time as indicated for disinfecting solutions.       Objective:   Physical Exam General Appearance:    Alert, cooperative, no distress, appears stated age  Head:    Normocephalic, without obvious abnormality, atraumatic  Eyes:    PERRL, conjunctiva/corneas clear, EOM's intact, fundi    benign, both eyes  Ears:    Normal TM's and external ear canals, both ears  Nose:   Deferred due to COVID  Throat:   Neck:   Supple, symmetrical, trachea midline, no adenopathy; well healing scar on L side of neck   Thyroid: no enlargement/tenderness/nodules  Back:     Symmetric, no curvature, ROM normal, no CVA tenderness  Lungs:     Clear to auscultation bilaterally, respirations unlabored  Chest Wall:    No tenderness or deformity   Heart:    Regular rate and rhythm, S1 and S2 normal, no murmur, rub   or gallop  Breast Exam:    Deferred to GYN  Abdomen:     Soft, non-tender, bowel sounds active all four quadrants,    no masses, no organomegaly  Genitalia:    Deferred to GYN  Rectal:    Extremities:   Extremities normal, atraumatic, no cyanosis or edema  Pulses:   2+ and symmetric all extremities  Skin:   Skin color, texture, turgor normal, no rashes or lesions  Lymph nodes:   Cervical, supraclavicular, and axillary nodes normal  Neurologic:   CNII-XII intact, normal strength, sensation and reflexes    throughout          Assessment & Plan:

## 2020-11-21 NOTE — Progress Notes (Signed)
Subjective:   [name] [name] is a 70 y.o. female who presents for Medicare Annual (Subsequent) preventive examination.  I connected with Che today by a video enabled telemedicine application and verified that I am speaking with the correct person using two identifiers.  Location of patient:Home Location of provider:Work  Persons participating in the virtual visit: patient, nurse.   I discussed the limitations, risk, security and privacy concerns of evaluation and management by telemedicine. The patient expressed understanding and agreed to proceed.  Some vital signs may be absent or patient reported.   Review of Systems     Cardiac Risk Factors include: advanced age (>61men, >33 women);dyslipidemia;obesity (BMI >30kg/m2)     Objective:    Today's Vitals   11/24/20 0943  Weight: 188 lb (85.3 kg)  Height: 5\' 6"  (1.676 m)   Body mass index is 30.34 kg/m.  Advanced Directives 11/24/2020 05/28/2020 12/12/2019 11/15/2018 07/13/2018 11/09/2017 01/05/2017  Does Patient Have a Medical Advance Directive? Yes Yes Yes Yes Yes Yes No  Type of Paramedic of [place];Living will [place];Living will [place];Living will Living will;Healthcare Power of [place];Living will [place];Living will -  Does patient want to make changes to medical advance directive? - - - - No - Patient declined - -  Copy of [place] in Chart? No - copy requested - No - copy requested No - copy requested Yes No - copy requested -    Current Medications (verified) Outpatient Encounter Medications as of 11/24/2020  Medication Sig  . aspirin 325 MG tablet Take 325 mg by mouth daily.  . B Complex-C (SUPER B COMPLEX PO) Take 1 tablet by mouth daily.   . brimonidine (ALPHAGAN) 0.2 % ophthalmic solution 1 drop 3 (three) times daily.  . cetirizine (ZYRTEC) 10 MG tablet Take 10 mg by mouth  daily.  . Cholecalciferol (VITAMIN D3) 5000 UNITS CAPS Take 5,000 Units by mouth daily.   . Coenzyme Q10 (COQ10) 200 MG CAPS Take 1 capsule by mouth daily.   Marland Kitchen doxycycline (ADOXA) 50 MG tablet Take 50 mg by mouth daily. For rosacea  . Magnesium 200 MG TABS Take 200 mg by mouth in the morning and at bedtime.   . Multiple Vitamins-Minerals (MULTIVITAMIN PO) Take 1 tablet by mouth daily.  . NP THYROID 60 MG tablet TAKE 1 TABLET BY MOUTH ONCE DAILY BEFORE BREAKFAST  . omeprazole (PRILOSEC) 20 MG capsule TAKE 1 CAPSULE (20 MG TOTAL) BY MOUTH DAILY.  . simvastatin (ZOCOR) 20 MG tablet TAKE 1 TABLET (20 MG TOTAL) BY MOUTH AT BEDTIME.  Carren Rang BALANCE 0.6 % SOLN SMARTSIG:1 Drop(s) In Eye(s) PRN  . Travoprost, BAK Free, (TRAVATAN) 0.004 % SOLN ophthalmic solution Place 1 drop into both eyes at bedtime.    No facility-administered encounter medications on file as of 11/24/2020.    Allergies (verified) Clindamycin/lincomycin, Flagyl [metronidazole hcl], Augmentin [amoxicillin-pot clavulanate], and Telithromycin   History: Past Medical History:  Diagnosis Date  . Allergy, unspecified not elsewhere classified   . Borderline hypertension   . Bronchitis   . Cataract   . DJD (degenerative joint disease)   . GERD (gastroesophageal reflux disease)   . Glaucoma   . History of skin cancer   . Hypercholesterolemia   . Hypothyroidism   . IBS (irritable bowel syndrome)   . OSA (obstructive sleep apnea) 12/27/2017  . Plantar fasciitis   . PONV (postoperative nausea and vomiting)   .  Postoperative nausea and vomiting 09/02/2015  . Primary localized osteoarthritis of left knee   . Skin cancer (melanoma) (HCC)    basil cell   . Vitamin D deficiency    Past Surgical History:  Procedure Laterality Date  . ANAL RECTAL MANOMETRY N/A 11/16/2019   Procedure: ANO RECTAL MANOMETRY;  Surgeon: Mauri Pole, MD;  Location: [facility];  Service: Endoscopy;  Laterality: N/A;  . BUNIONECTOMY  2004    left  . CATARACT EXTRACTION W/ INTRAOCULAR LENS  IMPLANT, BILATERAL    . COLONOSCOPY    . FOOT OSTEOTOMY  2003   right  . HERNIA REPAIR  1957  . KNEE ARTHROSCOPY  1987&2003   left  . MELANOMA EXCISION Right    shoulder  . SHOULDER ARTHROSCOPY WITH OPEN ROTATOR CUFF REPAIR  2013   right  . SKIN SURGERY  4/05   Melanoma surg left shoulder  . TOENAIL EXCISION  10/04/2017  . TOTAL KNEE ARTHROPLASTY Left 09/01/2015   Procedure: TOTAL KNEE ARTHROPLASTY;  Surgeon: Elsie Saas, MD;  Location: [place];  Service: Orthopedics;  Laterality: Left;  . TUBAL LIGATION  1990   Family History  Problem Relation Age of Onset  . Colon polyps Father   . Cancer Father        laryngeal  . Heart disease Father   . Diabetes Son   . Osteoporosis Sister   . Hypertension Brother   . Heart disease Maternal Grandfather   . Heart disease Paternal Grandfather   . Colon cancer Neg Hx   . Stomach cancer Neg Hx    Social History   Socioeconomic History  . Marital status: Married    Spouse name: Marya Amsler [phone]  . Number of children: 2  . Years of education: Not on file  . Highest education level: Not on file  Occupational History  . Occupation: retired  Tobacco Use  . Smoking status: Former Smoker    Years: 6.00    Quit date: 10/05/1975    Years since quitting: 45.1  . Smokeless tobacco: Never Used  Vaping Use  . Vaping Use: Never used  Substance and Sexual Activity  . Alcohol use: Yes    Alcohol/week: 14.0 standard drinks    Types: 12 Standard drinks or equivalent, 2 Glasses of wine per week    Comment: socially  . Drug use: No  . Sexual activity: Not on file  Other Topics Concern  . Not on file  Social History Narrative  . Not on file   Social Determinants of Health   Financial Resource Strain: Low Risk   . Difficulty of Paying Living Expenses: Not hard at all  Food Insecurity: No Food Insecurity  . Worried About Charity fundraiser in the Last Year: Never true  . Ran Out of Food  in the Last Year: Never true  Transportation Needs: No Transportation Needs  . Lack of Transportation (Medical): No  . Lack of Transportation (Non-Medical): No  Physical Activity: Insufficiently Active  . Days of Exercise per Week: 7 days  . Minutes of Exercise per Session: 20 min  Stress: No Stress Concern Present  . Feeling of Stress : Not at all  Social Connections: Moderately Isolated  . Frequency of Communication with Friends and Family: More than three times a week  . Frequency of Social Gatherings with Friends and Family: More than three times a week  . Attends Religious Services: Never  . Active Member of Clubs or Organizations: No  . Attends  Club or Organization Meetings: Never  . Marital Status: Married    Tobacco Counseling Counseling given: Not Answered   Clinical Intake:  Pre-visit preparation completed: Yes  Pain : No/denies pain     Nutritional Status: BMI of 19-24  Normal Nutritional Risks: None Diabetes: No  How often do you need to have someone help you when you read instructions, pamphlets, or other written materials from your doctor or pharmacy?: 1 - Never  Diabetic?No  Interpreter Needed?: No  Information entered by :: Caroleen Hamman LPN   Activities of Daily Living In your present state of health, do you have any difficulty performing the following activities: 11/24/2020 05/21/2020  Hearing? N N  Vision? N N  Difficulty concentrating or making decisions? N N  Walking or climbing stairs? N N  Dressing or bathing? N N  Doing errands, shopping? N N  Preparing Food and eating ? N -  Using the Toilet? N -  In the past six months, have you accidently leaked urine? N -  Do you have problems with loss of bowel control? Y -  Comment occasionally-has seen GI -  Managing your Medications? N -  Managing your Finances? N -  Housekeeping or managing your Housekeeping? N -  Some recent data might be hidden    Patient Care Team: Midge Minium,  MD as PCP - General (Family Medicine) Roque Cash., MD as Consulting Physician (Obstetrics and Gynecology) Hollar, Katharine Look, MD as Referring Physician (Dermatology) Elsie Saas, MD as Consulting Physician (Orthopedic Surgery) Regal, Tamala Fothergill, DPM as Consulting Physician (Podiatry) Janann August, MD as Referring Physician (Dermatology) Darleen Crocker, MD as Consulting Physician (Ophthalmology) Madelin Rear, [facility] as Pharmacist (Pharmacist)  Indicate any recent Medical Services you may have received from other than [facility] providers in the past year (date may be approximate).     Assessment:   This is a routine wellness examination for Shariya.  Hearing/Vision screen  Hearing Screening   125Hz  250Hz  500Hz  1000Hz  2000Hz  3000Hz  4000Hz  6000Hz  8000Hz   Right ear:           Left ear:           Comments: No issues  Vision Screening Comments: Last eye exam-10/2020-Dr. Bevis & Dr. Isac Caddy  Dietary issues and exercise activities discussed: Current Exercise Habits: Home exercise routine, Type of exercise: walking, Time (Minutes): 20, Frequency (Times/Week): 7, Weekly Exercise (Minutes/Week): 140, Intensity: Mild, Exercise limited by: None identified  Goals    . PharmD Care Plan    . Weight (lb) < 200 lb (90.7 kg)     Lose weight by cutting out carb/sugars in diet.     . Weight (lb) < 200 lb (90.7 kg)     Lose weight by increasing activity.       Depression Screen PHQ 2/9 Scores 11/24/2020 05/21/2020 11/20/2019 05/14/2019 11/15/2018 11/15/2018 09/11/2018  PHQ - 2 Score 0 0 0 0 0 0 0  PHQ- 9 Score - 0 0 0 0 - 0    Fall Risk Fall Risk  11/24/2020 05/28/2020 05/21/2020 11/20/2019 11/20/2019  Falls in the past year? 0 0 0 0 0  Number falls in past yr: 0 0 0 0 0  Injury with Fall? 0 0 0 0 0  Follow up Falls prevention discussed - Falls evaluation completed Falls evaluation completed Falls evaluation completed    FALL RISK PREVENTION PERTAINING TO THE HOME:  Any stairs in or around the  home? Yes  If so, are there any  without handrails? No  Home free of loose throw rugs in walkways, pet beds, electrical cords, etc? Yes  Adequate lighting in your home to reduce risk of falls? Yes   ASSISTIVE DEVICES UTILIZED TO PREVENT FALLS:  Life alert? No  Use of a cane, walker or w/c? No  Grab bars in the bathroom? Yes  Shower chair or bench in shower? No  Elevated toilet seat or a handicapped toilet? No   TIMED UP AND GO:  Was the test performed? No . Phone visit   Cognitive Function:Normal cognitive status assessed by by this Nurse Health Advisor. No abnormalities found.   MMSE - Mini Mental State Exam 11/15/2018 11/09/2017  Orientation to time 5 5  Orientation to Place 5 5  Registration 3 3  Attention/ Calculation 5 5  Recall 3 3  Language- name 2 objects 2 2  Language- repeat 1 1  Language- follow 3 step command 3 3  Language- read & follow direction 1 1  Write a sentence 1 1  Copy design 1 1  Total score 30 30        Immunizations Immunization History  Administered Date(s) Administered  . Fluad Quad(high Dose 65+) 06/04/2019  . Influenza Split 08/04/2012  . Influenza, High Dose Seasonal PF 06/12/2018, 06/03/2020  . Influenza, Seasonal, Injecte, Preservative Fre 06/04/2019  . Influenza,inj,Quad PF,6+ Mos 11/22/2014, 07/01/2015, 06/04/2016, 07/25/2017  . PFIZER(Purple Top)SARS-COV-2 Vaccination 10/26/2019, 11/13/2019, 06/19/2020  . Pneumococcal Conjugate-13 05/17/2018  . Tdap 10/01/2011  . Zoster 12/03/2015  . Zoster Recombinat (Shingrix) 07/25/2017, 12/22/2017    TDAP status: Up to date  Flu Vaccine status: Up to date  Pneumococcal vaccine status: Due, Education has been provided regarding the importance of this vaccine. Advised may receive this vaccine at local pharmacy or Health Dept. Aware to provide a copy of the vaccination record if obtained from local pharmacy or Health Dept. Verbalized acceptance and understanding.  Covid-19 vaccine status:  Completed vaccines  Qualifies for Shingles Vaccine? No   Zostavax completed Yes   Shingrix Completed?: Yes  Screening Tests Health Maintenance  Topic Date Due  . PNA vac Low Risk Adult (2 of 2 - PPSV23) 05/18/2019  . COVID-19 Vaccine (4 - Booster for Pfizer series) 12/17/2020  . MAMMOGRAM  08/14/2021  . TETANUS/TDAP  09/30/2021  . COLONOSCOPY (Pts 45-63yrs Insurance coverage will need to be confirmed)  03/29/2022  . INFLUENZA VACCINE  Completed  . DEXA SCAN  Completed  . Hepatitis C Screening  Completed    Health Maintenance  Health Maintenance Due  Topic Date Due  . PNA vac Low Risk Adult (2 of 2 - PPSV23) 05/18/2019    Colorectal cancer screening: Type of screening: Colonoscopy. Completed 6.26.2018. Repeat every 5 years  Mammogram status: Completed Bilateral 08/14/2020. Repeat every year  Bone Density status: Ordered today. Pt provided with contact info and advised to call to schedule appt.  Lung Cancer Screening: (Low Dose CT Chest recommended if Age 43-80 years, 30 pack-year currently smoking OR have quit w/in 15years.) does not qualify.    Additional Screening:  Hepatitis C Screening:  Completed 11/04/2016  Vision Screening: Recommended annual ophthalmology exams for early detection of glaucoma and other disorders of the eye. Is the patient up to date with their annual eye exam?  Yes  Who is the provider or what is the name of the office in which the patient attends annual eye exams? Dr. Chrisandra Carota & Dr. Talbert Forest   Dental Screening: Recommended annual dental exams for proper oral  hygiene  Community Resource Referral / Chronic Care Management: CRR required this visit?  No   CCM required this visit?  No      Plan:     I have personally reviewed and noted the following in the patient's chart:   . Medical and social history . Use of alcohol, tobacco or illicit drugs  . Current medications and supplements . Functional ability and status . Nutritional  status . Physical activity . Advanced directives . List of other physicians . Hospitalizations, surgeries, and ER visits in previous 12 months . Vitals . Screenings to include cognitive, depression, and falls . Referrals and appointments  In addition, I have reviewed and discussed with patient certain preventive protocols, quality metrics, and best practice recommendations. A written personalized care plan for preventive services as well as general preventive health recommendations were provided to patient.    Due to this being a virtual visit, the after visit summary with patients personalized plan was offered to patient via mail or my-chart.  Patient would like to access on my-chart.   Marta Antu, LPN   [id number]/[id number]  Nurse Health Advisor  Nurse Notes: None

## 2020-11-24 ENCOUNTER — Ambulatory Visit (INDEPENDENT_AMBULATORY_CARE_PROVIDER_SITE_OTHER): Payer: Medicare HMO

## 2020-11-24 VITALS — Ht 66.0 in | Wt 188.0 lb

## 2020-11-24 DIAGNOSIS — Z780 Asymptomatic menopausal state: Secondary | ICD-10-CM | POA: Diagnosis not present

## 2020-11-24 DIAGNOSIS — Z0000 Encounter for general adult medical examination without abnormal findings: Secondary | ICD-10-CM

## 2020-11-24 NOTE — Patient Instructions (Signed)
[name] [name] , Thank you for taking time to complete your Medicare Wellness Visit. I appreciate your ongoing commitment to your health goals. Please review the following plan we discussed and let me know if I can assist you in the future.   Screening recommendations/referrals: Colonoscopy: Completed 03/29/2017-Due 03/29/2022 Mammogram: Completed 08/14/2020-Due 08/14/2021 Bone Density: Ordered today. Someone will be calling you to schedule. Recommended yearly ophthalmology/optometry visit for glaucoma screening and checkup Recommended yearly dental visit for hygiene and checkup  Vaccinations: Influenza vaccine: Up to date  Pneumococcal vaccine: Due for Pneumovax-23- May obtain vaccine at our office or your local pharmacy. Tdap vaccine: Up to date-Due-09/30/2021 Shingles vaccine: Completed vaccines Covid-19:Completed vaccines  Advanced directives: Please bring a copy for your chart  Conditions/risks identified: See problem list  Next appointment: Follow up in one year for your annual wellness visit 11/30/2021 @ 9:45.   Preventive Care 27 Years and Older, Female Preventive care refers to lifestyle choices and visits with your health care provider that can promote health and wellness. What does preventive care include?  A yearly physical exam. This is also called an annual well check.  Dental exams once or twice a year.  Routine eye exams. Ask your health care provider how often you should have your eyes checked.  Personal lifestyle choices, including:  Daily care of your teeth and gums.  Regular physical activity.  Eating a healthy diet.  Avoiding tobacco and drug use.  Limiting alcohol use.  Practicing safe sex.  Taking low-dose aspirin every day.  Taking vitamin and mineral supplements as recommended by your health care provider. What happens during an annual well check? The services and screenings done by your health care provider during your annual well check will  depend on your age, overall health, lifestyle risk factors, and family history of disease. Counseling  Your health care provider may ask you questions about your:  Alcohol use.  Tobacco use.  Drug use.  Emotional well-being.  Home and relationship well-being.  Sexual activity.  Eating habits.  History of falls.  Memory and ability to understand (cognition).  Work and work Statistician.  Reproductive health. Screening  You may have the following tests or measurements:  Height, weight, and BMI.  Blood pressure.  Lipid and cholesterol levels. These may be checked every 5 years, or more frequently if you are over 42 years old.  Skin check.  Lung cancer screening. You may have this screening every year starting at age 67 if you have a 30-pack-year history of smoking and currently smoke or have quit within the past 15 years.  Fecal occult blood test (FOBT) of the stool. You may have this test every year starting at age 35.  Flexible sigmoidoscopy or colonoscopy. You may have a sigmoidoscopy every 5 years or a colonoscopy every 10 years starting at age 71.  Hepatitis C blood test.  Hepatitis B blood test.  Sexually transmitted disease (STD) testing.  Diabetes screening. This is done by checking your blood sugar (glucose) after you have not eaten for a while (fasting). You may have this done every 1-3 years.  Bone density scan. This is done to screen for osteoporosis. You may have this done starting at age 80.  Mammogram. This may be done every 1-2 years. Talk to your health care provider about how often you should have regular mammograms. Talk with your health care provider about your test results, treatment options, and if necessary, the need for more tests. Vaccines  Your health care provider  may recommend certain vaccines, such as:  Influenza vaccine. This is recommended every year.  Tetanus, diphtheria, and acellular pertussis (Tdap, Td) vaccine. You may need a  Td booster every 10 years.  Zoster vaccine. You may need this after age 64.  Pneumococcal 13-valent conjugate (PCV13) vaccine. One dose is recommended after age 50.  Pneumococcal polysaccharide (PPSV23) vaccine. One dose is recommended after age 67. Talk to your health care provider about which screenings and vaccines you need and how often you need them. This information is not intended to replace advice given to you by your health care provider. Make sure you discuss any questions you have with your health care provider. Document Released: 10/17/2015 Document Revised: 06/09/2016 Document Reviewed: 07/22/2015 Elsevier Interactive Patient Education  2017 [place] Prevention in the Home Falls can cause injuries. They can happen to people of all ages. There are many things you can do to make your home safe and to help prevent falls. What can I do on the outside of my home?  Regularly fix the edges of walkways and driveways and fix any cracks.  Remove anything that might make you trip as you walk through a door, such as a raised step or threshold.  Trim any bushes or trees on the path to your home.  Use bright outdoor lighting.  Clear any walking paths of anything that might make someone trip, such as rocks or tools.  Regularly check to see if handrails are loose or broken. Make sure that both sides of any steps have handrails.  Any raised decks and porches should have guardrails on the edges.  Have any leaves, snow, or ice cleared regularly.  Use sand or salt on walking paths during winter.  Clean up any spills in your garage right away. This includes oil or grease spills. What can I do in the bathroom?  Use night lights.  Install grab bars by the toilet and in the tub and shower. Do not use towel bars as grab bars.  Use non-skid mats or decals in the tub or shower.  If you need to sit down in the shower, use a plastic, non-slip stool.  Keep the floor dry. Clean  up any water that spills on the floor as soon as it happens.  Remove soap buildup in the tub or shower regularly.  Attach bath mats securely with double-sided non-slip rug tape.  Do not have throw rugs and other things on the floor that can make you trip. What can I do in the bedroom?  Use night lights.  Make sure that you have a light by your bed that is easy to reach.  Do not use any sheets or blankets that are too big for your bed. They should not hang down onto the floor.  Have a firm chair that has side arms. You can use this for support while you get dressed.  Do not have throw rugs and other things on the floor that can make you trip. What can I do in the kitchen?  Clean up any spills right away.  Avoid walking on wet floors.  Keep items that you use a lot in easy-to-reach places.  If you need to reach something above you, use a strong step stool that has a grab bar.  Keep electrical cords out of the way.  Do not use floor polish or wax that makes floors slippery. If you must use wax, use non-skid floor wax.  Do not have throw rugs  and other things on the floor that can make you trip. What can I do with my stairs?  Do not leave any items on the stairs.  Make sure that there are handrails on both sides of the stairs and use them. Fix handrails that are broken or loose. Make sure that handrails are as long as the stairways.  Check any carpeting to make sure that it is firmly attached to the stairs. Fix any carpet that is loose or worn.  Avoid having throw rugs at the top or bottom of the stairs. If you do have throw rugs, attach them to the floor with carpet tape.  Make sure that you have a light switch at the top of the stairs and the bottom of the stairs. If you do not have them, ask someone to add them for you. What else can I do to help prevent falls?  Wear shoes that:  Do not have high heels.  Have rubber bottoms.  Are comfortable and fit you well.  Are  closed at the toe. Do not wear sandals.  If you use a stepladder:  Make sure that it is fully opened. Do not climb a closed stepladder.  Make sure that both sides of the stepladder are locked into place.  Ask someone to hold it for you, if possible.  Clearly mark and make sure that you can see:  Any grab bars or handrails.  First and last steps.  Where the edge of each step is.  Use tools that help you move around (mobility aids) if they are needed. These include:  Canes.  Walkers.  Scooters.  Crutches.  Turn on the lights when you go into a dark area. Replace any light bulbs as soon as they burn out.  Set up your furniture so you have a clear path. Avoid moving your furniture around.  If any of your floors are uneven, fix them.  If there are any pets around you, be aware of where they are.  Review your medicines with your doctor. Some medicines can make you feel dizzy. This can increase your chance of falling. Ask your doctor what other things that you can do to help prevent falls. This information is not intended to replace advice given to you by your health care provider. Make sure you discuss any questions you have with your health care provider. Document Released: 07/17/2009 Document Revised: 02/26/2016 Document Reviewed: 10/25/2014 Elsevier Interactive Patient Education  2017 [employer].

## 2020-12-01 ENCOUNTER — Encounter: Payer: Self-pay | Admitting: Family Medicine

## 2020-12-01 DIAGNOSIS — D0439 Carcinoma in situ of skin of other parts of face: Secondary | ICD-10-CM | POA: Diagnosis not present

## 2020-12-01 DIAGNOSIS — L570 Actinic keratosis: Secondary | ICD-10-CM | POA: Diagnosis not present

## 2020-12-01 DIAGNOSIS — L821 Other seborrheic keratosis: Secondary | ICD-10-CM | POA: Diagnosis not present

## 2020-12-01 DIAGNOSIS — L820 Inflamed seborrheic keratosis: Secondary | ICD-10-CM | POA: Diagnosis not present

## 2020-12-01 DIAGNOSIS — Z85820 Personal history of malignant melanoma of skin: Secondary | ICD-10-CM | POA: Diagnosis not present

## 2020-12-01 DIAGNOSIS — D485 Neoplasm of uncertain behavior of skin: Secondary | ICD-10-CM | POA: Diagnosis not present

## 2020-12-01 DIAGNOSIS — Z85828 Personal history of other malignant neoplasm of skin: Secondary | ICD-10-CM | POA: Diagnosis not present

## 2020-12-03 ENCOUNTER — Other Ambulatory Visit: Payer: Self-pay

## 2020-12-03 ENCOUNTER — Ambulatory Visit (INDEPENDENT_AMBULATORY_CARE_PROVIDER_SITE_OTHER)
Admission: RE | Admit: 2020-12-03 | Discharge: 2020-12-03 | Disposition: A | Discharge status: Home / Self Care | Payer: Medicare HMO | Source: Ambulatory Visit | Attending: Family Medicine | Admitting: Family Medicine

## 2020-12-03 DIAGNOSIS — Z780 Asymptomatic menopausal state: Secondary | ICD-10-CM

## 2020-12-05 ENCOUNTER — Encounter: Payer: Self-pay | Admitting: Family Medicine

## 2020-12-09 ENCOUNTER — Encounter: Payer: Self-pay | Admitting: Family Medicine

## 2020-12-10 ENCOUNTER — Other Ambulatory Visit: Payer: Self-pay | Admitting: *Deleted

## 2020-12-10 DIAGNOSIS — E038 Other specified hypothyroidism: Secondary | ICD-10-CM

## 2020-12-10 MED ORDER — NP THYROID 60 MG PO TABS: 60.0000 mg | ORAL_TABLET | Freq: Every day | ORAL | 0 refills | Status: DC

## 2020-12-28 DIAGNOSIS — S46011A Strain of muscle(s) and tendon(s) of the rotator cuff of right shoulder, initial encounter: Secondary | ICD-10-CM | POA: Diagnosis not present

## 2021-01-01 DIAGNOSIS — M25511 Pain in right shoulder: Secondary | ICD-10-CM | POA: Diagnosis not present

## 2021-01-06 DIAGNOSIS — S46011A Strain of muscle(s) and tendon(s) of the rotator cuff of right shoulder, initial encounter: Secondary | ICD-10-CM | POA: Diagnosis not present

## 2021-01-08 ENCOUNTER — Telehealth: Payer: Self-pay | Admitting: Family Medicine

## 2021-01-08 NOTE — Telephone Encounter (Signed)
I will fax this once it is completed.

## 2021-01-08 NOTE — Telephone Encounter (Signed)
Pre surgical form that was given to Dr. Birdie Riddle yesterday needs to be faxed to [phone].  Attention:Sherri

## 2021-01-12 ENCOUNTER — Other Ambulatory Visit: Payer: Self-pay

## 2021-01-12 ENCOUNTER — Encounter (HOSPITAL_BASED_OUTPATIENT_CLINIC_OR_DEPARTMENT_OTHER): Payer: Self-pay | Admitting: Orthopaedic Surgery

## 2021-01-12 NOTE — Telephone Encounter (Signed)
Noted  

## 2021-01-12 NOTE — Telephone Encounter (Signed)
Form completed and placed in basket  

## 2021-01-12 NOTE — Telephone Encounter (Signed)
Faxed to [phone] and sent to scanning

## 2021-01-14 ENCOUNTER — Encounter: Payer: Self-pay | Admitting: Family Medicine

## 2021-01-19 ENCOUNTER — Other Ambulatory Visit (HOSPITAL_COMMUNITY)
Admission: RE | Admit: 2021-01-19 | Discharge: 2021-01-19 | Disposition: A | Discharge status: Home / Self Care | Payer: Medicare HMO | Source: Ambulatory Visit | Attending: Orthopaedic Surgery | Admitting: Orthopaedic Surgery

## 2021-01-19 ENCOUNTER — Other Ambulatory Visit: Payer: Self-pay

## 2021-01-19 ENCOUNTER — Encounter (HOSPITAL_BASED_OUTPATIENT_CLINIC_OR_DEPARTMENT_OTHER)
Admission: RE | Admit: 2021-01-19 | Discharge: 2021-01-19 | Disposition: A | Discharge status: Home / Self Care | Payer: Medicare HMO | Source: Ambulatory Visit | Attending: Orthopaedic Surgery | Admitting: Orthopaedic Surgery

## 2021-01-19 DIAGNOSIS — E039 Hypothyroidism, unspecified: Secondary | ICD-10-CM | POA: Diagnosis not present

## 2021-01-19 DIAGNOSIS — S46011A Strain of muscle(s) and tendon(s) of the rotator cuff of right shoulder, initial encounter: Secondary | ICD-10-CM | POA: Diagnosis not present

## 2021-01-19 DIAGNOSIS — Z01812 Encounter for preprocedural laboratory examination: Secondary | ICD-10-CM | POA: Diagnosis not present

## 2021-01-19 DIAGNOSIS — Z833 Family history of diabetes mellitus: Secondary | ICD-10-CM | POA: Diagnosis not present

## 2021-01-19 DIAGNOSIS — Y939 Activity, unspecified: Secondary | ICD-10-CM | POA: Diagnosis not present

## 2021-01-19 DIAGNOSIS — Z880 Allergy status to penicillin: Secondary | ICD-10-CM | POA: Diagnosis not present

## 2021-01-19 DIAGNOSIS — Z8262 Family history of osteoporosis: Secondary | ICD-10-CM | POA: Diagnosis not present

## 2021-01-19 DIAGNOSIS — Z8249 Family history of ischemic heart disease and other diseases of the circulatory system: Secondary | ICD-10-CM | POA: Diagnosis not present

## 2021-01-19 DIAGNOSIS — Z7982 Long term (current) use of aspirin: Secondary | ICD-10-CM | POA: Diagnosis not present

## 2021-01-19 DIAGNOSIS — Z20822 Contact with and (suspected) exposure to covid-19: Secondary | ICD-10-CM | POA: Diagnosis not present

## 2021-01-19 DIAGNOSIS — Z79899 Other long term (current) drug therapy: Secondary | ICD-10-CM | POA: Diagnosis not present

## 2021-01-19 DIAGNOSIS — G4733 Obstructive sleep apnea (adult) (pediatric): Secondary | ICD-10-CM | POA: Diagnosis not present

## 2021-01-19 DIAGNOSIS — Z888 Allergy status to other drugs, medicaments and biological substances status: Secondary | ICD-10-CM | POA: Diagnosis not present

## 2021-01-19 DIAGNOSIS — Z881 Allergy status to other antibiotic agents status: Secondary | ICD-10-CM | POA: Diagnosis not present

## 2021-01-19 DIAGNOSIS — W19XXXA Unspecified fall, initial encounter: Secondary | ICD-10-CM | POA: Diagnosis not present

## 2021-01-19 DIAGNOSIS — Z87891 Personal history of nicotine dependence: Secondary | ICD-10-CM | POA: Diagnosis not present

## 2021-01-19 DIAGNOSIS — K589 Irritable bowel syndrome without diarrhea: Secondary | ICD-10-CM | POA: Diagnosis not present

## 2021-01-19 DIAGNOSIS — E785 Hyperlipidemia, unspecified: Secondary | ICD-10-CM | POA: Diagnosis not present

## 2021-01-19 DIAGNOSIS — K219 Gastro-esophageal reflux disease without esophagitis: Secondary | ICD-10-CM | POA: Diagnosis not present

## 2021-01-19 LAB — SURGICAL PCR SCREEN
MRSA, PCR: NEGATIVE
Staphylococcus aureus: NEGATIVE

## 2021-01-19 NOTE — Progress Notes (Signed)

## 2021-01-20 LAB — SARS CORONAVIRUS 2 (TAT 6-24 HRS): SARS Coronavirus 2: NEGATIVE

## 2021-01-20 NOTE — H&P (Signed)
PREOPERATIVE H&P  Chief Complaint: DJD RIGHT SHOULDER  HPI: [name] [name] is a 70 y.o. female who is scheduled for Procedure(s): REVERSE SHOULDER ARTHROPLASTY.   Patient has a past medical history significant for melanoma, PONV, OSA, IBS, Hypothyroidism, GERD, HLD.   Patient had a right shoulder pain after a fall at [place] on 12-27-20. She had a history of a rotator cuff in 2013 with mild soreness in the shoulder before. She was worried about a  repeat rotator cuff tear. She does have limitations in the right shoulder.  Her symptoms are rated as moderate to severe, and have been worsening.  This is significantly impairing activities of daily living.    Please see clinic note for further details on this patient's care.    She has elected for surgical management.   Past Medical History:  Diagnosis Date  . Allergy, unspecified not elsewhere classified   . Borderline hypertension   . Bronchitis   . Cataract   . DJD (degenerative joint disease)   . GERD (gastroesophageal reflux disease)   . Glaucoma   . History of skin cancer   . Hypercholesterolemia   . Hypothyroidism   . IBS (irritable bowel syndrome)   . OSA (obstructive sleep apnea) 12/27/2017   no CPAP  . Plantar fasciitis   . PONV (postoperative nausea and vomiting)   . Postoperative nausea and vomiting 09/02/2015  . Primary localized osteoarthritis of left knee   . Skin cancer (melanoma) (HCC)    basil cell   . Vitamin D deficiency    Past Surgical History:  Procedure Laterality Date  . ANAL RECTAL MANOMETRY N/A 11/16/2019   Procedure: ANO RECTAL MANOMETRY;  Surgeon: Mauri Pole, MD;  Location: [facility];  Service: Endoscopy;  Laterality: N/A;  . BUNIONECTOMY  2004   left  . CATARACT EXTRACTION W/ INTRAOCULAR LENS  IMPLANT, BILATERAL    . COLONOSCOPY    . FOOT OSTEOTOMY  2003   right  . HERNIA REPAIR  1957  . KNEE ARTHROSCOPY  1987&2003   left  . MELANOMA EXCISION Right    shoulder  .  SHOULDER ARTHROSCOPY WITH OPEN ROTATOR CUFF REPAIR  2013   right  . SKIN SURGERY  4/05   Melanoma surg left shoulder  . TOENAIL EXCISION  10/04/2017  . TOTAL KNEE ARTHROPLASTY Left 09/01/2015   Procedure: TOTAL KNEE ARTHROPLASTY;  Surgeon: Elsie Saas, MD;  Location: [facility];  Service: Orthopedics;  Laterality: Left;  . TUBAL LIGATION  1990   Social History   Socioeconomic History  . Marital status: Married    Spouse name: Marya Amsler [phone]  . Number of children: 2  . Years of education: Not on file  . Highest education level: Not on file  Occupational History  . Occupation: retired  Tobacco Use  . Smoking status: Former Smoker    Years: 6.00    Quit date: 10/05/1975    Years since quitting: 45.3  . Smokeless tobacco: Never Used  Vaping Use  . Vaping Use: Never used  Substance and Sexual Activity  . Alcohol use: Yes    Alcohol/week: 14.0 standard drinks    Types: 12 Standard drinks or equivalent, 2 Glasses of wine per week    Comment: socially  . Drug use: No  . Sexual activity: Not on file  Other Topics Concern  . Not on file  Social History Narrative  . Not on file   Social Determinants of Health   Financial Resource Strain: Low  Risk   . Difficulty of Paying Living Expenses: Not hard at all  Food Insecurity: No Food Insecurity  . Worried About Charity fundraiser in the Last Year: Never true  . Ran Out of Food in the Last Year: Never true  Transportation Needs: No Transportation Needs  . Lack of Transportation (Medical): No  . Lack of Transportation (Non-Medical): No  Physical Activity: Insufficiently Active  . Days of Exercise per Week: 7 days  . Minutes of Exercise per Session: 20 min  Stress: No Stress Concern Present  . Feeling of Stress : Not at all  Social Connections: Moderately Isolated  . Frequency of Communication with Friends and Family: More than three times a week  . Frequency of Social Gatherings with Friends and Family: More than three times a  week  . Attends Religious Services: Never  . Active Member of Clubs or Organizations: No  . Attends Archivist Meetings: Never  . Marital Status: Married   Family History  Problem Relation Age of Onset  . Colon polyps Father   . Cancer Father        laryngeal  . Heart disease Father   . Diabetes Son   . Osteoporosis Sister   . Hypertension Brother   . Heart disease Maternal Grandfather   . Heart disease Paternal Grandfather   . Colon cancer Neg Hx   . Stomach cancer Neg Hx    Allergies  Allergen Reactions  . Clindamycin/Lincomycin Diarrhea  . Flagyl [Metronidazole Hcl] Diarrhea    Stomach upset. Pt states she CANNOT TAKE oral, has no problem with topical.   . Augmentin [Amoxicillin-Pot Clavulanate] Diarrhea  . Telithromycin Diarrhea   Prior to Admission medications   Medication Sig Start Date End Date Taking? Authorizing Provider  aspirin 325 MG tablet Take 325 mg by mouth daily.   Yes [provider]  B Complex-C (SUPER B COMPLEX PO) Take 1 tablet by mouth daily.    Yes [provider]  brimonidine (ALPHAGAN) 0.2 % ophthalmic solution 1 drop 3 (three) times daily. 05/19/20  Yes [provider]  cetirizine (ZYRTEC) 10 MG tablet Take 10 mg by mouth daily.   Yes [provider]  Cholecalciferol (VITAMIN D3) 5000 UNITS CAPS Take 5,000 Units by mouth daily.    Yes [provider]  Coenzyme Q10 (COQ10) 200 MG CAPS Take 1 capsule by mouth daily.  10/04/13  Yes [provider]  doxycycline (ADOXA) 50 MG tablet Take 50 mg by mouth daily. For rosacea   Yes [provider]  Magnesium 200 MG TABS Take 200 mg by mouth in the morning and at bedtime.    Yes [provider]  Multiple Vitamins-Minerals (MULTIVITAMIN PO) Take 1 tablet by mouth daily.   Yes [provider]  NP THYROID 60 MG tablet Take 1 tablet (60 mg total) by mouth daily before breakfast. 12/10/20  Yes Midge Minium, MD  omeprazole  (PRILOSEC) 20 MG capsule TAKE 1 CAPSULE (20 MG TOTAL) BY MOUTH DAILY. 10/16/20  Yes Midge Minium, MD  simvastatin (ZOCOR) 20 MG tablet TAKE 1 TABLET (20 MG TOTAL) BY MOUTH AT BEDTIME. 10/16/20  Yes Midge Minium, MD  SYSTANE BALANCE 0.6 % SOLN SMARTSIG:1 Drop(s) In Eye(s) PRN 05/16/20  Yes [provider]  Travoprost, BAK Free, (TRAVATAN) 0.004 % SOLN ophthalmic solution Place 1 drop into both eyes at bedtime.  11/16/19  Yes [provider]    ROS: All other systems have been  reviewed and were otherwise negative with the exception of those mentioned in the HPI and as above.  Physical Exam: General: Alert, no acute distress Cardiovascular: No pedal edema Respiratory: No cyanosis, no use of accessory musculature GI: No organomegaly, abdomen is soft and non-tender Skin: No lesions in the area of chief complaint Neurologic: Sensation intact distally Psychiatric: Patient is competent for consent with normal mood and affect Lymphatic: No axillary or cervical lymphadenopathy  MUSCULOSKELETAL:  Right shoulder: Active forward elevation to about 80, external rotation to 20. Passive forward elevation to 160. Cuff strength is weak throughout at a 4/5.   Imaging: MRI is reviewed demonstrating massive supraspinatus and infraspinatus tear to the mid portion of the humeral head versus closer to the glenoid. She has overall poor appearing tissue quality on her MRI.  Assessment: DJD RIGHT SHOULDER  Plan: Plan for Procedure(s): REVERSE SHOULDER ARTHROPLASTY  The risks benefits and alternatives were discussed with the patient including but not limited to the risks of nonoperative treatment, versus surgical intervention including infection, bleeding, nerve injury,  blood clots, cardiopulmonary complications, morbidity, mortality, among others, and they were willing to proceed.   We additionally specifically discussed risks of axillary nerve injury, infection, periprosthetic  fracture, continued pain and longevity of implants prior to beginning procedure.    Patient will be closely monitored in PACU for medical stabilization and pain control. If found stable in PACU, patient may be discharged home with outpatient follow-up. If any concerns regarding patient's stabilization patient will be admitted for observation after surgery. The patient is planning to be discharged home with outpatient PT.   The patient acknowledged the explanation, agreed to proceed with the plan and consent was signed.   Operative Plan: Right reverse total shoulder arthroplasty Discharge Medications: Tylenol, Celebrex, Oxycodone, Zofran DVT Prophylaxis: Aspirin Physical Therapy: Outpatient PT Special Discharge needs: Sling. IceMan.   Ethelda Chick, PA-C  01/20/2021 4:20 PM

## 2021-01-21 ENCOUNTER — Ambulatory Visit (HOSPITAL_BASED_OUTPATIENT_CLINIC_OR_DEPARTMENT_OTHER): Payer: Medicare HMO | Admitting: Certified Registered"

## 2021-01-21 ENCOUNTER — Encounter (HOSPITAL_BASED_OUTPATIENT_CLINIC_OR_DEPARTMENT_OTHER)
Admission: RE | Disposition: A | Discharge status: Home / Self Care | Payer: Self-pay | Source: Home / Self Care | Attending: Orthopaedic Surgery

## 2021-01-21 ENCOUNTER — Ambulatory Visit (HOSPITAL_COMMUNITY): Payer: Medicare HMO

## 2021-01-21 ENCOUNTER — Ambulatory Visit (HOSPITAL_BASED_OUTPATIENT_CLINIC_OR_DEPARTMENT_OTHER)
Admission: RE | Admit: 2021-01-21 | Discharge: 2021-01-21 | Disposition: A | Discharge status: Home / Self Care | Payer: Medicare HMO | Attending: Orthopaedic Surgery | Admitting: Orthopaedic Surgery

## 2021-01-21 ENCOUNTER — Encounter (HOSPITAL_BASED_OUTPATIENT_CLINIC_OR_DEPARTMENT_OTHER): Payer: Self-pay | Admitting: Orthopaedic Surgery

## 2021-01-21 ENCOUNTER — Other Ambulatory Visit: Payer: Self-pay

## 2021-01-21 DIAGNOSIS — E039 Hypothyroidism, unspecified: Secondary | ICD-10-CM | POA: Insufficient documentation

## 2021-01-21 DIAGNOSIS — Z79899 Other long term (current) drug therapy: Secondary | ICD-10-CM | POA: Diagnosis not present

## 2021-01-21 DIAGNOSIS — Z881 Allergy status to other antibiotic agents status: Secondary | ICD-10-CM | POA: Insufficient documentation

## 2021-01-21 DIAGNOSIS — Z87891 Personal history of nicotine dependence: Secondary | ICD-10-CM | POA: Insufficient documentation

## 2021-01-21 DIAGNOSIS — G4733 Obstructive sleep apnea (adult) (pediatric): Secondary | ICD-10-CM | POA: Insufficient documentation

## 2021-01-21 DIAGNOSIS — Z8249 Family history of ischemic heart disease and other diseases of the circulatory system: Secondary | ICD-10-CM | POA: Insufficient documentation

## 2021-01-21 DIAGNOSIS — Z888 Allergy status to other drugs, medicaments and biological substances status: Secondary | ICD-10-CM | POA: Insufficient documentation

## 2021-01-21 DIAGNOSIS — K589 Irritable bowel syndrome without diarrhea: Secondary | ICD-10-CM | POA: Insufficient documentation

## 2021-01-21 DIAGNOSIS — Z96619 Presence of unspecified artificial shoulder joint: Secondary | ICD-10-CM

## 2021-01-21 DIAGNOSIS — Z09 Encounter for follow-up examination after completed treatment for conditions other than malignant neoplasm: Secondary | ICD-10-CM

## 2021-01-21 DIAGNOSIS — W19XXXA Unspecified fall, initial encounter: Secondary | ICD-10-CM | POA: Insufficient documentation

## 2021-01-21 DIAGNOSIS — Z833 Family history of diabetes mellitus: Secondary | ICD-10-CM | POA: Insufficient documentation

## 2021-01-21 DIAGNOSIS — Z7982 Long term (current) use of aspirin: Secondary | ICD-10-CM | POA: Insufficient documentation

## 2021-01-21 DIAGNOSIS — E785 Hyperlipidemia, unspecified: Secondary | ICD-10-CM | POA: Diagnosis not present

## 2021-01-21 DIAGNOSIS — K219 Gastro-esophageal reflux disease without esophagitis: Secondary | ICD-10-CM | POA: Insufficient documentation

## 2021-01-21 DIAGNOSIS — I10 Essential (primary) hypertension: Secondary | ICD-10-CM | POA: Diagnosis not present

## 2021-01-21 DIAGNOSIS — S46011A Strain of muscle(s) and tendon(s) of the rotator cuff of right shoulder, initial encounter: Secondary | ICD-10-CM | POA: Diagnosis not present

## 2021-01-21 DIAGNOSIS — Z8262 Family history of osteoporosis: Secondary | ICD-10-CM | POA: Insufficient documentation

## 2021-01-21 DIAGNOSIS — Z96611 Presence of right artificial shoulder joint: Secondary | ICD-10-CM | POA: Diagnosis not present

## 2021-01-21 DIAGNOSIS — M19011 Primary osteoarthritis, right shoulder: Secondary | ICD-10-CM | POA: Diagnosis not present

## 2021-01-21 DIAGNOSIS — Y939 Activity, unspecified: Secondary | ICD-10-CM | POA: Insufficient documentation

## 2021-01-21 DIAGNOSIS — Z880 Allergy status to penicillin: Secondary | ICD-10-CM | POA: Insufficient documentation

## 2021-01-21 DIAGNOSIS — G8918 Other acute postprocedural pain: Secondary | ICD-10-CM | POA: Diagnosis not present

## 2021-01-21 HISTORY — DX: Presence of unspecified artificial shoulder joint: Z96.619

## 2021-01-21 HISTORY — PX: REVERSE SHOULDER ARTHROPLASTY: SHX5054

## 2021-01-21 SURGERY — ARTHROPLASTY, SHOULDER, TOTAL, REVERSE
Anesthesia: General | Site: Shoulder | Laterality: Right

## 2021-01-21 MED ORDER — EPINEPHRINE PF 1 MG/ML IJ SOLN
INTRAMUSCULAR | Status: AC
  Filled 2021-01-21: qty 4

## 2021-01-21 MED ORDER — CELECOXIB 200 MG PO CAPS
400.0000 mg | ORAL_CAPSULE | Freq: Once | ORAL | Status: AC
  Administered 2021-01-21: 400 mg via ORAL

## 2021-01-21 MED ORDER — TRANEXAMIC ACID-NACL 1000-0.7 MG/100ML-% IV SOLN
1000.0000 mg | INTRAVENOUS | Status: AC
  Administered 2021-01-21: 1000 mg via INTRAVENOUS

## 2021-01-21 MED ORDER — ROCURONIUM BROMIDE 10 MG/ML (PF) SYRINGE
PREFILLED_SYRINGE | INTRAVENOUS | Status: AC
  Filled 2021-01-21: qty 10

## 2021-01-21 MED ORDER — LACTATED RINGERS IV SOLN: INTRAVENOUS | Status: DC

## 2021-01-21 MED ORDER — MIDAZOLAM HCL 2 MG/2ML IJ SOLN
2.0000 mg | Freq: Once | INTRAMUSCULAR | Status: AC
  Administered 2021-01-21: 1 mg via INTRAVENOUS

## 2021-01-21 MED ORDER — VANCOMYCIN HCL 1000 MG IV SOLR
INTRAVENOUS | Status: AC
  Filled 2021-01-21: qty 3000

## 2021-01-21 MED ORDER — PROPOFOL 500 MG/50ML IV EMUL
INTRAVENOUS | Status: AC
  Filled 2021-01-21: qty 50

## 2021-01-21 MED ORDER — ONDANSETRON HCL 4 MG PO TABS: 4.0000 mg | ORAL_TABLET | Freq: Three times a day (TID) | ORAL | 0 refills | Status: AC | PRN

## 2021-01-21 MED ORDER — PHENYLEPHRINE HCL (PRESSORS) 10 MG/ML IV SOLN
INTRAVENOUS | Status: DC | PRN
  Administered 2021-01-21 (×4): 80 ug via INTRAVENOUS

## 2021-01-21 MED ORDER — SCOPOLAMINE 1 MG/3DAYS TD PT72
MEDICATED_PATCH | TRANSDERMAL | Status: AC
  Filled 2021-01-21: qty 1

## 2021-01-21 MED ORDER — BUPIVACAINE LIPOSOME 1.3 % IJ SUSP
INTRAMUSCULAR | Status: DC | PRN
  Administered 2021-01-21: 10 mL via PERINEURAL

## 2021-01-21 MED ORDER — FENTANYL CITRATE (PF) 100 MCG/2ML IJ SOLN
INTRAMUSCULAR | Status: AC
  Filled 2021-01-21: qty 2

## 2021-01-21 MED ORDER — CELECOXIB 100 MG PO CAPS: 100.0000 mg | ORAL_CAPSULE | Freq: Two times a day (BID) | ORAL | 0 refills | Status: AC

## 2021-01-21 MED ORDER — LACTATED RINGERS IV BOLUS: 500.0000 mL | Freq: Once | INTRAVENOUS | Status: DC

## 2021-01-21 MED ORDER — EPHEDRINE SULFATE 50 MG/ML IJ SOLN
INTRAMUSCULAR | Status: DC | PRN
  Administered 2021-01-21 (×3): 10 mg via INTRAVENOUS

## 2021-01-21 MED ORDER — FENTANYL CITRATE (PF) 100 MCG/2ML IJ SOLN: 25.0000 ug | INTRAMUSCULAR | Status: DC | PRN

## 2021-01-21 MED ORDER — ACETAMINOPHEN 500 MG PO TABS: 1000.0000 mg | ORAL_TABLET | Freq: Three times a day (TID) | ORAL | 0 refills | Status: AC

## 2021-01-21 MED ORDER — PHENYLEPHRINE 40 MCG/ML (10ML) SYRINGE FOR IV PUSH (FOR BLOOD PRESSURE SUPPORT)
PREFILLED_SYRINGE | INTRAVENOUS | Status: AC
  Filled 2021-01-21: qty 10

## 2021-01-21 MED ORDER — AMISULPRIDE (ANTIEMETIC) 5 MG/2ML IV SOLN: 10.0000 mg | Freq: Once | INTRAVENOUS | Status: DC | PRN

## 2021-01-21 MED ORDER — CELECOXIB 200 MG PO CAPS
ORAL_CAPSULE | ORAL | Status: AC
  Filled 2021-01-21: qty 2

## 2021-01-21 MED ORDER — EPHEDRINE 5 MG/ML INJ
INTRAVENOUS | Status: AC
  Filled 2021-01-21: qty 10

## 2021-01-21 MED ORDER — DIPHENHYDRAMINE HCL 50 MG/ML IJ SOLN
INTRAMUSCULAR | Status: DC | PRN
  Administered 2021-01-21: 6.25 mg via INTRAVENOUS

## 2021-01-21 MED ORDER — ONDANSETRON HCL 4 MG/2ML IJ SOLN
INTRAMUSCULAR | Status: AC
  Filled 2021-01-21: qty 2

## 2021-01-21 MED ORDER — LIDOCAINE 2% (20 MG/ML) 5 ML SYRINGE
INTRAMUSCULAR | Status: AC
  Filled 2021-01-21: qty 5

## 2021-01-21 MED ORDER — LIDOCAINE HCL (CARDIAC) PF 100 MG/5ML IV SOSY
PREFILLED_SYRINGE | INTRAVENOUS | Status: DC | PRN
  Administered 2021-01-21: 80 mg via INTRAVENOUS

## 2021-01-21 MED ORDER — PROPOFOL 10 MG/ML IV BOLUS
INTRAVENOUS | Status: AC
  Filled 2021-01-21: qty 40

## 2021-01-21 MED ORDER — DEXAMETHASONE SODIUM PHOSPHATE 4 MG/ML IJ SOLN
INTRAMUSCULAR | Status: DC | PRN
  Administered 2021-01-21: 10 mg via INTRAVENOUS

## 2021-01-21 MED ORDER — ACETAMINOPHEN 500 MG PO TABS
ORAL_TABLET | ORAL | Status: AC
  Filled 2021-01-21: qty 1

## 2021-01-21 MED ORDER — ROCURONIUM BROMIDE 100 MG/10ML IV SOLN
INTRAVENOUS | Status: DC | PRN
  Administered 2021-01-21: 60 mg via INTRAVENOUS

## 2021-01-21 MED ORDER — PROPOFOL 10 MG/ML IV BOLUS
INTRAVENOUS | Status: DC | PRN
  Administered 2021-01-21: 140 mg via INTRAVENOUS
  Administered 2021-01-21: 50 ug/kg/min via INTRAVENOUS

## 2021-01-21 MED ORDER — DEXAMETHASONE SODIUM PHOSPHATE 10 MG/ML IJ SOLN
INTRAMUSCULAR | Status: AC
  Filled 2021-01-21: qty 1

## 2021-01-21 MED ORDER — BUPIVACAINE HCL (PF) 0.25 % IJ SOLN
INTRAMUSCULAR | Status: AC
  Filled 2021-01-21: qty 30

## 2021-01-21 MED ORDER — DIPHENHYDRAMINE HCL 50 MG/ML IJ SOLN
INTRAMUSCULAR | Status: AC
  Filled 2021-01-21: qty 1

## 2021-01-21 MED ORDER — SCOPOLAMINE 1 MG/3DAYS TD PT72
1.0000 | MEDICATED_PATCH | TRANSDERMAL | Status: DC
  Administered 2021-01-21: 1.5 mg via TRANSDERMAL

## 2021-01-21 MED ORDER — GABAPENTIN 300 MG PO CAPS
300.0000 mg | ORAL_CAPSULE | Freq: Once | ORAL | Status: AC
  Administered 2021-01-21: 300 mg via ORAL

## 2021-01-21 MED ORDER — GABAPENTIN 100 MG PO CAPS: 100.0000 mg | ORAL_CAPSULE | Freq: Three times a day (TID) | ORAL | 0 refills | Status: DC

## 2021-01-21 MED ORDER — CEFAZOLIN SODIUM-DEXTROSE 2-4 GM/100ML-% IV SOLN
2.0000 g | INTRAVENOUS | Status: AC
  Administered 2021-01-21: 2 g via INTRAVENOUS

## 2021-01-21 MED ORDER — CEFAZOLIN SODIUM-DEXTROSE 2-4 GM/100ML-% IV SOLN
INTRAVENOUS | Status: AC
  Filled 2021-01-21: qty 100

## 2021-01-21 MED ORDER — FENTANYL CITRATE (PF) 100 MCG/2ML IJ SOLN
100.0000 ug | Freq: Once | INTRAMUSCULAR | Status: AC
  Administered 2021-01-21: 50 ug via INTRAVENOUS

## 2021-01-21 MED ORDER — SUGAMMADEX SODIUM 200 MG/2ML IV SOLN
INTRAVENOUS | Status: DC | PRN
  Administered 2021-01-21: 200 mg via INTRAVENOUS

## 2021-01-21 MED ORDER — OXYCODONE HCL 5 MG PO TABS: ORAL_TABLET | ORAL | 0 refills | Status: AC

## 2021-01-21 MED ORDER — VANCOMYCIN HCL 1000 MG IV SOLR
INTRAVENOUS | Status: DC | PRN
  Administered 2021-01-21: 1000 mg via TOPICAL

## 2021-01-21 MED ORDER — TRANEXAMIC ACID-NACL 1000-0.7 MG/100ML-% IV SOLN
INTRAVENOUS | Status: AC
  Filled 2021-01-21: qty 100

## 2021-01-21 MED ORDER — BUPIVACAINE HCL (PF) 0.5 % IJ SOLN
INTRAMUSCULAR | Status: DC | PRN
  Administered 2021-01-21: 10 mL via PERINEURAL

## 2021-01-21 MED ORDER — MIDAZOLAM HCL 2 MG/2ML IJ SOLN
INTRAMUSCULAR | Status: AC
  Filled 2021-01-21: qty 2

## 2021-01-21 MED ORDER — LACTATED RINGERS IV BOLUS: 250.0000 mL | Freq: Once | INTRAVENOUS | Status: DC

## 2021-01-21 MED ORDER — GABAPENTIN 300 MG PO CAPS
ORAL_CAPSULE | ORAL | Status: AC
  Filled 2021-01-21: qty 1

## 2021-01-21 MED ORDER — ACETAMINOPHEN 500 MG PO TABS
1000.0000 mg | ORAL_TABLET | Freq: Once | ORAL | Status: AC
  Administered 2021-01-21: 1000 mg via ORAL

## 2021-01-21 SURGICAL SUPPLY — 73 items
AID PSTN UNV HD RSTRNT DISP (MISCELLANEOUS) ×1
APL PRP STRL LF DISP 70% ISPRP (MISCELLANEOUS) ×1
BASEPLATE GLENOSPHERE 25 STD (Miscellaneous) ×1 IMPLANT
BLADE HEX COATED 2.75 (ELECTRODE) IMPLANT
BLADE SAW SAG 73X25 THK (BLADE) ×1
BLADE SAW SGTL 73X25 THK (BLADE) ×1 IMPLANT
BLADE SURG 10 STRL SS (BLADE) IMPLANT
BLADE SURG 15 STRL LF DISP TIS (BLADE) IMPLANT
BLADE SURG 15 STRL SS (BLADE)
BNDG COHESIVE 4X5 TAN STRL (GAUZE/BANDAGES/DRESSINGS) IMPLANT
BSPLAT GLND STD 25 RVRS SHLDR (Miscellaneous) ×1 IMPLANT
CHLORAPREP W/TINT 26 (MISCELLANEOUS) ×2 IMPLANT
CLSR STERI-STRIP ANTIMIC 1/2X4 (GAUZE/BANDAGES/DRESSINGS) ×2 IMPLANT
COOLER ICEMAN CLASSIC (MISCELLANEOUS) ×2 IMPLANT
COVER BACK TABLE 60X90IN (DRAPES) ×2 IMPLANT
COVER MAYO STAND STRL (DRAPES) ×2 IMPLANT
COVER WAND RF STERILE (DRAPES) IMPLANT
DECANTER SPIKE VIAL GLASS SM (MISCELLANEOUS) IMPLANT
DRAPE IMP U-DRAPE 54X76 (DRAPES) ×2 IMPLANT
DRAPE INCISE IOBAN 66X45 STRL (DRAPES) ×2 IMPLANT
DRAPE U-SHAPE 76X120 STRL (DRAPES) ×4 IMPLANT
DRSG AQUACEL AG ADV 3.5X 6 (GAUZE/BANDAGES/DRESSINGS) ×2 IMPLANT
ELECT BLADE 4.0 EZ CLEAN MEGAD (MISCELLANEOUS) ×2
ELECT REM PT RETURN 9FT ADLT (ELECTROSURGICAL) ×2
ELECTRODE BLDE 4.0 EZ CLN MEGD (MISCELLANEOUS) ×1 IMPLANT
ELECTRODE REM PT RTRN 9FT ADLT (ELECTROSURGICAL) ×1 IMPLANT
GLENOSPHERE REV SHOULDER 36 (Joint) ×1 IMPLANT
GLOVE SRG 8 PF TXTR STRL LF DI (GLOVE) ×1 IMPLANT
GLOVE SURG ENC MOIS LTX SZ6.5 (GLOVE) ×4 IMPLANT
GLOVE SURG LTX SZ8 (GLOVE) ×2 IMPLANT
GLOVE SURG UNDER POLY LF SZ6.5 (GLOVE) ×2 IMPLANT
GLOVE SURG UNDER POLY LF SZ7 (GLOVE) ×1 IMPLANT
GLOVE SURG UNDER POLY LF SZ8 (GLOVE) ×2
GOWN STRL REUS W/ TWL LRG LVL3 (GOWN DISPOSABLE) ×2 IMPLANT
GOWN STRL REUS W/TWL LRG LVL3 (GOWN DISPOSABLE) ×4
GOWN STRL REUS W/TWL XL LVL3 (GOWN DISPOSABLE) ×2 IMPLANT
GUIDEWIRE GLENOID 2.5X220 (WIRE) ×1 IMPLANT
HANDPIECE INTERPULSE COAX TIP (DISPOSABLE) ×2
HUMERAL STEM AEQUALIS 3BX74MM (Stem) ×2 IMPLANT
IMPL REVERSE SHOULDER 0X3.5 (Shoulder) IMPLANT
IMPLANT REVERSE SHOULDER 0X3.5 (Shoulder) ×2 IMPLANT
INSERT HUMERAL 36X6MM 12.5DEG (Insert) ×1 IMPLANT
KIT LEG STABILIZATION (KITS) ×1 IMPLANT
KIT STABILIZATION SHOULDER (MISCELLANEOUS) ×1 IMPLANT
MANIFOLD NEPTUNE II (INSTRUMENTS) ×2 IMPLANT
NS IRRIG 1000ML POUR BTL (IV SOLUTION) ×1 IMPLANT
PACK BASIN DAY SURGERY FS (CUSTOM PROCEDURE TRAY) ×2 IMPLANT
PACK SHOULDER (CUSTOM PROCEDURE TRAY) ×2 IMPLANT
PAD COLD SHLDR UNI WRAP-ON (PAD) ×2
PAD COLD SHLDR WRAP-ON (PAD) ×1 IMPLANT
PAD COLD UNI WRAP-ON (PAD) IMPLANT
PAD ORTHO SHOULDER 7X19 LRG (SOFTGOODS) ×2 IMPLANT
PENCIL SMOKE EVACUATOR (MISCELLANEOUS) ×1 IMPLANT
RESTRAINT HEAD UNIVERSAL NS (MISCELLANEOUS) ×2 IMPLANT
SCREW 5.5X22 (Screw) ×1 IMPLANT
SCREW CENTRAL THREAD 6.5X45 (Screw) ×1 IMPLANT
SCREW PERIPHERAL 42 (Screw) ×1 IMPLANT
SET HNDPC FAN SPRY TIP SCT (DISPOSABLE) ×1 IMPLANT
SHEET MEDIUM DRAPE 40X70 STRL (DRAPES) ×2 IMPLANT
SHIELD FACE FULL FLUID (MISCELLANEOUS) ×1 IMPLANT
SLEEVE SCD COMPRESS KNEE MED (STOCKING) ×2 IMPLANT
SPONGE LAP 18X18 RF (DISPOSABLE) IMPLANT
STEM HUMERAL AEQUALIS 3BX74MM (Stem) IMPLANT
SUT ETHIBOND 2 V 37 (SUTURE) ×2 IMPLANT
SUT ETHIBOND NAB CT1 #1 30IN (SUTURE) ×2 IMPLANT
SUT FIBERWIRE #5 38 CONV NDL (SUTURE) ×8
SUT MNCRL AB 4-0 PS2 18 (SUTURE) ×1 IMPLANT
SUT VIC AB 3-0 SH 27 (SUTURE) ×2
SUT VIC AB 3-0 SH 27X BRD (SUTURE) ×1 IMPLANT
SUTURE FIBERWR #5 38 CONV NDL (SUTURE) ×3 IMPLANT
SYR BULB IRRIG 60ML STRL (SYRINGE) IMPLANT
TOWEL GREEN STERILE FF (TOWEL DISPOSABLE) ×6 IMPLANT
TUBE SUCTION HIGH CAP CLEAR NV (SUCTIONS) ×2 IMPLANT

## 2021-01-21 NOTE — Anesthesia Preprocedure Evaluation (Addendum)
Anesthesia Evaluation  Patient identified by MRN, date of birth, ID band Patient awake    Reviewed: Allergy & Precautions, NPO status , Patient's Chart, lab work & pertinent test results  History of Anesthesia Complications (+) PONV and history of anesthetic complications  Airway Mallampati: II  TM Distance: >3 FB Neck ROM: Full    Dental  (+) Dental Advisory Given, Teeth Intact   Pulmonary sleep apnea , former smoker,    Pulmonary exam normal breath sounds clear to auscultation       Cardiovascular hypertension, Normal cardiovascular exam Rhythm:Regular Rate:Normal     Neuro/Psych Anxiety    GI/Hepatic GERD  Medicated,  Endo/Other  Hypothyroidism   Renal/GU      Musculoskeletal  (+) Arthritis ,   Abdominal (+) + obese,   Peds  Hematology   Anesthesia Other Findings   Reproductive/Obstetrics                            Anesthesia Physical  Anesthesia Plan  ASA: II  Anesthesia Plan: General   Post-op Pain Management: GA combined w/ Regional for post-op pain   Induction:   PONV Risk Score and Plan: 4 or greater and Ondansetron, Dexamethasone, Propofol infusion, Midazolam, Treatment may vary due to age or medical condition and Diphenhydramine  Airway Management Planned: Oral ETT  Additional Equipment: None  Intra-op Plan:   Post-operative Plan: Extubation in OR  Informed Consent: I have reviewed the patients History and Physical, chart, labs and discussed the procedure including the risks, benefits and alternatives for the proposed anesthesia with the patient or authorized representative who has indicated his/her understanding and acceptance.     Dental advisory given  Plan Discussed with: CRNA  Anesthesia Plan Comments:        Anesthesia Quick Evaluation

## 2021-01-21 NOTE — Transfer of Care (Signed)
Immediate Anesthesia Transfer of Care Note  Patient: [name] [name]  Procedure(s) Performed: REVERSE SHOULDER ARTHROPLASTY (Right Shoulder)  Patient Location: PACU  Anesthesia Type:General  Level of Consciousness: awake, alert , oriented and patient cooperative  Airway & Oxygen Therapy: Patient Spontanous Breathing and Patient connected to face mask oxygen  Post-op Assessment: Report given to RN, Post -op Vital signs reviewed and stable and Patient moving all extremities X 4  Post vital signs: Reviewed and stable  Last Vitals:  Vitals Value Taken Time  BP 143/52 01/21/21 1210  Temp    Pulse 73 01/21/21 1212  Resp 13 01/21/21 1212  SpO2 100 % 01/21/21 1212  Vitals shown include unvalidated device data.  Last Pain:  Vitals:   01/21/21 0903  TempSrc: Oral  PainSc: 0-No pain      Patients Stated Pain Goal: 3 (09/26/48 7530)  Complications: No complications documented.

## 2021-01-21 NOTE — Discharge Instructions (Signed)
Ophelia Charter MD, MPH Noemi Chapel, PA-C [facility] [address] [place], [address] [phone] (tel)   [phone] (fax)   [facility]    WOUND CARE ? You may leave the operative dressing in place until your follow-up appointment. ? KEEP THE INCISIONS CLEAN AND DRY. ? There may be a small amount of fluid/bleeding leaking at the surgical site. This is normal after surgery.  ? If it fills with liquid or blood please call us immediately to change it for you. ? Use the provided ice machine or Ice packs as often as possible for the first 3-4 days, then as needed for pain relief.  Keep a layer of cloth or a shirt between your skin and the cooling unit to prevent frost bite as it can get very cold.  SHOWERING: - You may shower on Post-Op Day #2.  - The dressing is water resistant but do not scrub it as it may start to peel up.   - You may remove the sling for showering, but keep a water resistant pillow under the arm to keep both the  elbow and shoulder away from the body (mimicking the abduction sling).  - Gently pat the area dry.  - Do not soak the shoulder in water. Do not go swimming in the pool or ocean until your incision is completely healed (about 4 weeks after surgery) - KEEP THE INCISIONS CLEAN AND DRY.  EXERCISES ? Wear the sling at all times.  ? You may remove the sling for showering, but keep the arm across the chest or in a secondary sling.    ? Accidental/Purposeful External Rotation and shoulder flexion (reaching behind you) is to be avoided at all costs for the first month. ? It is ok to come out of your sling if your are sitting and have assistance for eating.   ? Do not lift anything heavier than 1 pound until we discuss it further in clinic.   REGIONAL ANESTHESIA (NERVE BLOCKS) . The anesthesia team may have performed a nerve block for you if safe in the setting of your care.  This is a great tool used to  minimize pain.  Typically the block may start wearing off overnight but the long acting medicine may last for 3-4 days.  The nerve block wearing off can be a challenging period but please utilize your as needed pain medications to try and manage this period.    POST-OP MEDICATIONS- Multimodal approach to pain control . In general your pain will be controlled with a combination of substances.  Prescriptions unless otherwise discussed are electronically sent to your pharmacy.  This is a carefully made plan we use to minimize narcotic use.     ? Celebrex - Anti-inflammatory medication taken on a scheduled basis - Take 1 tablet twice a day  ? Acetaminophen - Non-narcotic pain medicine taken on a scheduled basis  - Take two 500 mg tablets (1,000 mg total) every 8 hours ? Gabapentin - this is a medication to help with pain, take on a scheduled basis - Take 1 tablet three times a day ? Oxycodone - This is a strong narcotic, to be used only on an "as needed" basis for severe pain. ? Zofran -  take as needed for nausea  *You had Celebrex today at 9:10am you can take another dose at bedtime *You had 1000 mg of Tylenol at 9:10am you can take your next dose at 3:10pm  FOLLOW-UP ?  If you develop a Fever (>101.5), Redness or Drainage from the surgical incision site, please call our office to arrange for an evaluation. ? Please call the office to schedule a follow-up appointment for a wound check, 7-10 days post-operatively.  IF YOU HAVE ANY QUESTIONS, PLEASE FEEL FREE TO CALL OUR OFFICE.  HELPFUL INFORMATION  . If you had a block, it will wear off between 8-24 hrs postop typically.  This is period when your pain may go from nearly zero to the pain you would have had post-op without the block.  This is an abrupt transition but nothing dangerous is happening.  You may take an extra dose of narcotic when this happens.  ? Your arm will be in a sling following surgery. You will be in this sling for the next  4 weeks.  I will let you know the exact duration at your follow-up visit.  ? You may be more comfortable sleeping in a semi-seated position the first few nights following surgery.  Keep a pillow propped under the elbow and forearm for comfort.  If you have a recliner type of chair it might be beneficial.  If not that is fine too, but it would be helpful to sleep propped up with pillows behind your operated shoulder as well under your elbow and forearm.  This will reduce pulling on the suture lines.  ? When dressing, put your operative arm in the sleeve first.  When getting undressed, take your operative arm out last.  Loose fitting, button-down shirts are recommended.  ? In most states it is against the law to drive while your arm is in a sling. And certainly against the law to drive while taking narcotics.  ? You may return to work/school in the next couple of days when you feel up to it. Desk work and typing in the sling is fine.  ? We suggest you use the pain medication the first night prior to going to bed, in order to ease any pain when the anesthesia wears off. You should avoid taking pain medications on an empty stomach as it will make you nauseous.  ? Do not drink alcoholic beverages or take illicit drugs when taking pain medications.  ? Pain medication may make you constipated.  Below are a few solutions to try in this order: - Decrease the amount of pain medication if you aren't having pain. - Drink lots of decaffeinated fluids. - Drink prune juice and/or each dried prunes  o If the first 3 don't work start with additional solutions - Take Colace - an over-the-counter stool softener - Take Senokot - an over-the-counter laxative - Take Miralax - a stronger over-the-counter laxative   Dental Antibiotics:  In most cases prophylactic antibiotics for Dental procdeures after total joint surgery are not necessary.  Exceptions are as follows:  1. History of prior total joint  infection  2. Severely immunocompromised (Organ Transplant, cancer chemotherapy, Rheumatoid biologic meds such as [place])  3. Poorly controlled diabetes (A1C &gt; 8.0, blood glucose over 200)  If you have one of these conditions, contact your surgeon for an antibiotic prescription, prior to your dental procedure.   For more information including helpful videos and documents visit our website:   [email or web address]    Post Anesthesia Home Care Instructions  Activity: Get plenty of rest for the remainder of the day. A responsible individual must stay with you for 24 hours following the procedure.  For the next 24 hours, DO NOT: -Drive a car -  Operate machinery -Drink alcoholic beverages -Take any medication unless instructed by your physician -Make any legal decisions or sign important papers.  Meals: Start with liquid foods such as gelatin or soup. Progress to regular foods as tolerated. Avoid greasy, spicy, heavy foods. If nausea and/or vomiting occur, drink only clear liquids until the nausea and/or vomiting subsides. Call your physician if vomiting continues.  Special Instructions/Symptoms: Your throat may feel dry or sore from the anesthesia or the breathing tube placed in your throat during surgery. If this causes discomfort, gargle with warm salt water. The discomfort should disappear within 24 hours.  If you had a scopolamine patch placed behind your ear for the management of post- operative nausea and/or vomiting:  1. The medication in the patch is effective for 72 hours, after which it should be removed.  Wrap patch in a tissue and discard in the trash. Wash hands thoroughly with soap and water. 2. You may remove the patch earlier than 72 hours if you experience unpleasant side effects which may include dry mouth, dizziness or visual disturbances. 3. Avoid touching the patch. Wash your hands with soap and water after contact with the  patch.    Information for Discharge Teaching: EXPAREL (bupivacaine liposome injectable suspension)   Your surgeon or anesthesiologist gave you EXPAREL(bupivacaine) to help control your pain after surgery.   EXPAREL is a local anesthetic that provides pain relief by numbing the tissue around the surgical site.  EXPAREL is designed to release pain medication over time and can control pain for up to 72 hours.  Depending on how you respond to EXPAREL, you may require less pain medication during your recovery.  Possible side effects:  Temporary loss of sensation or ability to move in the area where bupivacaine was injected.  Nausea, vomiting, constipation  Rarely, numbness and tingling in your mouth or lips, lightheadedness, or anxiety may occur.  Call your doctor right away if you think you may be experiencing any of these sensations, or if you have other questions regarding possible side effects.  Follow all other discharge instructions given to you by your surgeon or nurse. Eat a healthy diet and drink plenty of water or other fluids.  If you return to the hospital for any reason within 96 hours following the administration of EXPAREL, it is important for health care providers to know that you have received this anesthetic. A teal colored band has been placed on your arm with the date, time and amount of EXPAREL you have received in order to alert and inform your health care providers. Please leave this armband in place for the full 96 hours following administration, and then you may remove the band.  Regional Anesthesia Blocks  1. Numbness or the inability to move the "blocked" extremity may last from 3-48 hours after placement. The length of time depends on the medication injected and your individual response to the medication. If the numbness is not going away after 48 hours, call your surgeon.  2. The extremity that is blocked will need to be protected until the numbness is gone and  the  Strength has returned. Because you cannot feel it, you will need to take extra care to avoid injury. Because it may be weak, you may have difficulty moving it or using it. You may not know what position it is in without looking at it while the block is in effect.  3. For blocks in the legs and feet, returning to weight bearing and walking needs  to be done carefully. You will need to wait until the numbness is entirely gone and the strength has returned. You should be able to move your leg and foot normally before you try and bear weight or walk. You will need someone to be with you when you first try to ensure you do not fall and possibly risk injury.  4. Bruising and tenderness at the needle site are common side effects and will resolve in a few days.  5. Persistent numbness or new problems with movement should be communicated to the surgeon or the [facility] [phone] [place] [phone]).

## 2021-01-21 NOTE — Anesthesia Postprocedure Evaluation (Signed)
Anesthesia Post Note  Patient: [name] [name]  Procedure(s) Performed: REVERSE SHOULDER ARTHROPLASTY (Right Shoulder)     Patient location during evaluation: PACU Anesthesia Type: General Level of consciousness: sedated and patient cooperative Pain management: pain level controlled Vital Signs Assessment: post-procedure vital signs reviewed and stable Respiratory status: spontaneous breathing Cardiovascular status: stable Anesthetic complications: no   No complications documented.  Last Vitals:  Vitals:   01/21/21 1300 01/21/21 1324  BP: (!) 155/75 (!) 157/76  Pulse: 70 74  Resp: 16 16  Temp:  36.6 C  SpO2: 96% 96%    Last Pain:  Vitals:   01/21/21 1324  TempSrc:   PainSc: 0-No pain                 Nolon Nations

## 2021-01-21 NOTE — Anesthesia Procedure Notes (Signed)
Procedure Name: Intubation Performed by: Jayne Peckenpaugh S, CRNA Pre-anesthesia Checklist: Patient identified, Emergency Drugs available, Suction available and Patient being monitored Patient Re-evaluated:Patient Re-evaluated prior to induction Oxygen Delivery Method: Circle System Utilized Preoxygenation: Pre-oxygenation with 100% oxygen Induction Type: IV induction Ventilation: Mask ventilation without difficulty Laryngoscope Size: Mac and 3 Grade View: Grade I Tube type: Oral Tube size: 7.0 mm Number of attempts: 1 Airway Equipment and Method: Stylet and Oral airway Placement Confirmation: ETT inserted through vocal cords under direct vision,  positive ETCO2 and breath sounds checked- equal and bilateral Secured at: 22 cm Tube secured with: Tape Dental Injury: Teeth and Oropharynx as per pre-operative assessment        

## 2021-01-21 NOTE — Progress Notes (Signed)
Assisted Dr. Germeroth with right, ultrasound guided, interscalene  block. Side rails up, monitors on throughout procedure. See vital signs in flow sheet. Tolerated Procedure well.  

## 2021-01-21 NOTE — Interval H&P Note (Signed)
All questions answered

## 2021-01-21 NOTE — Op Note (Signed)
Orthopaedic Surgery Operative Note (CSN: [id number])  [name] [name]  1951-03-27 Date of Surgery: 01/21/2021   Diagnoses:  Right shoulder irreparable cuff tear  Procedure: Right reverse total Shoulder Arthroplasty   Operative Finding Successful completion of planned procedure.  Patient superior cuff including supra infraspinatus were irreparably torn as was the subscapularis.  We are able to mobilize some of the subscapularis for eventual repair at least partially after her implant was placed.  Good fixation overall.  Post-operative plan: The patient will be NWB in sling.  The patient will be will be discharged from PACU if continues to be stable as was plan prior to surgery.  DVT prophylaxis Aspirin 81 mg twice daily for 6 weeks.  Pain control with PRN pain medication preferring oral medicines.  Follow up plan will be scheduled in approximately 7 days for incision check and XR.  Physical therapy to start after first visit.  Implants: Tornier size 3B short stem, 0 high offset tray with a 36+6 poly-, 25 standard baseplate with a 36 glenosphere and a 45 center screw  Post-Op Diagnosis: Same Surgeons:Primary: Hiram Gash, MD Assistants:Caroline McBane PA-C Location: [facility] OR ROOM 6 Anesthesia: General with Exparel Interscalene Antibiotics: Ancef 2g preop, Vancomycin 1000mg  locally Tourniquet time: None Estimated Blood Loss: 793 Complications: None Specimens: None Implants: Implant Name Type Inv. Item Serial No. Manufacturer Lot No. LRB No. Used Action  BASEPLATE GLENOSPHERE 90ZE STD - [id number] Miscellaneous BASEPLATE GLENOSPHERE 26JF STD [id number] [employer]  Right 1 Implanted  SCREW CENTRAL THREAD 6.5X45 - [id number] Screw SCREW CENTRAL THREAD 6.5X45  [employer] STERILIZED ON SET Right 1 Implanted  SCREW PERIPHERAL 42 - [id number] Screw SCREW PERIPHERAL 42  [employer] STERILIZED ON SET Right 1 Implanted  SCREW 5.5X22 - [id number] Screw SCREW 5.5X22  [employer] STERILIZED ON SET  Right 1 Implanted  GLENOSPHERE REV SHOULDER 36 - [id number] Joint GLENOSPHERE REV SHOULDER 36 [id number] [employer]  Right 1 Implanted  INSERT HUMERAL 36X6MM 12.5DEG - [id number] Insert INSERT HUMERAL 36X6MM 12.5DEG [id number] [employer]  Right 1 Implanted  IMPLANT REVERSE SHOULDER 0X3.5 - [id number] Shoulder IMPLANT REVERSE SHOULDER 0X3.5 [id number] [employer]  Right 1 Implanted  HUMERAL STEM AEQUALIS 3BX74MM - [id number] Stem HUMERAL STEM Eugenia Pancoast 3BX74MM [id number] [employer]  Right 1 Implanted    Indications for Surgery:   [name] [name] is a 70 y.o. female with irreparable cuff tear.  Benefits and risks of operative and nonoperative management were discussed prior to surgery with patient/guardian(s) and informed consent form was completed.  Infection and need for further surgery were discussed as was prosthetic stability and cuff issues.  We additionally specifically discussed risks of axillary nerve injury, infection, periprosthetic fracture, continued pain and longevity of implants prior to beginning procedure.      Procedure:   The patient was identified in the preoperative holding area where the surgical site was marked. Block placed by anesthesia with exparel.  The patient was taken to the OR where a procedural timeout was called and the above noted anesthesia was induced.  The patient was positioned beachchair on allen table with spider arm positioner.  Preoperative antibiotics were dosed.  The patient's right shoulder was prepped and draped in the usual sterile fashion.  A second preoperative timeout was called.       Standard deltopectoral approach was performed with a #10 blade. We dissected down to the subcutaneous tissues and the cephalic vein was taken laterally with the deltoid. Clavipectoral fascia was incised in line with  the incision. Deep retractors were placed. The long of the biceps tendon was identified and there was significant tenosynovitis present.  Tenodesis  was performed to the pectoralis tendon with #2 Ethibond. The remaining biceps was followed up into the rotator interval where it was released.   The subscapularis was taken down in a full thickness layer with capsule along the humeral neck extending inferiorly around the humeral head. We continued releasing the capsule directly off of the osteophytes inferiorly all the way around the corner. This allowed [place] to dislocate the humeral head.   The rotator cuff was carefully examined and noted to be irreperably torn.  The decision was confirmed that a reverse total shoulder was indicated for this patient.  There were osteophytes along the inferior humeral neck. The osteophytes were removed with an osteotome and a rongeur.  Osteophytes were removed with a rongeur and an osteotome and the anatomic neck was well visualized.     A humeral cutting guide was inserted down the intramedullary canal. The version was set at 20 of retroversion. Humeral osteotomy was performed with an oscillating saw. The head fragment was passed off the back table. A starter awl was used to open the humeral canal. We next used T-handle straight sound reamers to ream up to an appropriate fit. A chisel was used to remove proximal humeral bone. We then broached starting with a size one broach and broaching up to 3 which obtained an appropriate fit. The broach handle was removed. A cut protector was placed. The broach handle was removed and a cut protector was placed. The humerus was retracted posteriorly and we turned our attention to glenoid exposure.  The subscapularis was again identified and immediately we took care to palpate the axillary nerve anteriorly and verify its position with gentle palpation as well as the tug test.  We then released the SGHL with bovie cautery prior to placing a curved mayo at the junction of the anterior glenoid well above the axillary nerve and bluntly dissecting the subscapularis from the capsule.  We then  carefully protected the axillary nerve as we gently released the inferior capsule to fully mobilize the subscapularis.  An anterior deltoid retractor was then placed as well as a small Hohmann retractor superiorly.   The glenoid was relatively intact in the setting of an irreparable cuff tear  The remaining labrum was removed circumferentially taking great care not to disrupt the posterior capsule.   The glenoid drill guide was placed and used to drill a guide pin in the center, inferior position. The glenoid face was then reamed concentrically over the guide wire. The center hole was drilled over the guidepin in a near anatomic angle of version. Next the glenoid vault was drilled back to a depth of 45 mm.  We tapped and then placed a 40mm size baseplate with additional 69mm lateralization was selected with a 6.5 mm x 45 mm length central screw.  The base plate was screwed into the glenoid vault obtaining secure fixation. We next placed superior and inferior locking screws for additional fixation.  Next a 36 mm glenosphere was selected and impacted onto the baseplate. The center screw was tightened.  We turned attention back to the humeral side. The cut protector was removed. We trialed with multiple size tray and polyethylene options and selected a 6 which provided good stability and range of motion without excess soft tissue tension. The offset was dialed in to match the normal anatomy. The shoulder was trialed.  There was good ROM in all planes and the shoulder was stable with no inferior translation.  The real humeral implants were opened after again confirming sizes.  The trial was removed. #5 Fiberwire x4 sutures passed through the humeral neck for subscap repair. The humeral component was press-fit obtaining a secure fit. A +0 high offset tray was selected and impacted onto the stem.  A 36+6 polyethylene liner was impacted onto the stem.  The joint was reduced and thoroughly irrigated with pulsatile  lavage. Subscap was repaired back with #5 Fiberwire sutures through bone tunnels. Hemostasis was obtained. The deltopectoral interval was reapproximated with #1 Ethibond. The subcutaneous tissues were closed with 2-0 Vicryl and the skin was closed with running monocryl.    The wounds were cleaned and dried and an Aquacel dressing was placed. The drapes taken down. The arm was placed into sling with abduction pillow. Patient was awakened, extubated, and transferred to the recovery room in stable condition. There were no intraoperative complications. The sponge, needle, and attention counts were  correct at the end of the case.        Noemi Chapel, PA-C, present and scrubbed throughout the case, critical for completion in a timely fashion, and for retraction, instrumentation, closure.

## 2021-01-21 NOTE — Anesthesia Procedure Notes (Signed)
Anesthesia Regional Block: Interscalene brachial plexus block   Pre-Anesthetic Checklist: ,, timeout performed, Correct Patient, Correct Site, Correct Laterality, Correct Procedure, Correct Position, site marked, Risks and benefits discussed,  Surgical consent,  Pre-op evaluation,  At surgeon's request and post-op pain management  Laterality: Right  Prep: chloraprep       Needles:  Injection technique: Single-shot  Needle Type: Stimulator Needle - 40     Needle Length: 4cm  Needle Gauge: 22     Additional Needles:   Procedures:,,,, ultrasound used (permanent image in chart),,,,  Narrative:  Start time: 01/21/2021 9:15 AM End time: 01/21/2021 9:36 AM Injection made incrementally with aspirations every 5 mL.  Performed by: Personally  Anesthesiologist: Nolon Nations, MD  Additional Notes: BP cuff, EKG monitors applied. Sedation begun. Nerve location verified with U/S. Anesthetic injected incrementally, slowly , and after neg aspirations under direct u/s guidance. Good perineural spread. Tolerated well.

## 2021-01-22 ENCOUNTER — Encounter (HOSPITAL_BASED_OUTPATIENT_CLINIC_OR_DEPARTMENT_OTHER): Payer: Self-pay | Admitting: Orthopaedic Surgery

## 2021-01-29 DIAGNOSIS — M19011 Primary osteoarthritis, right shoulder: Secondary | ICD-10-CM | POA: Diagnosis not present

## 2021-01-30 DIAGNOSIS — M6281 Muscle weakness (generalized): Secondary | ICD-10-CM | POA: Diagnosis not present

## 2021-01-30 DIAGNOSIS — M25511 Pain in right shoulder: Secondary | ICD-10-CM | POA: Diagnosis not present

## 2021-01-30 DIAGNOSIS — Z96611 Presence of right artificial shoulder joint: Secondary | ICD-10-CM | POA: Diagnosis not present

## 2021-01-30 DIAGNOSIS — M25611 Stiffness of right shoulder, not elsewhere classified: Secondary | ICD-10-CM | POA: Diagnosis not present

## 2021-02-02 DIAGNOSIS — R918 Other nonspecific abnormal finding of lung field: Secondary | ICD-10-CM | POA: Diagnosis not present

## 2021-02-02 DIAGNOSIS — Z881 Allergy status to other antibiotic agents status: Secondary | ICD-10-CM | POA: Diagnosis not present

## 2021-02-02 DIAGNOSIS — Z880 Allergy status to penicillin: Secondary | ICD-10-CM | POA: Diagnosis not present

## 2021-02-02 DIAGNOSIS — Z886 Allergy status to analgesic agent status: Secondary | ICD-10-CM | POA: Diagnosis not present

## 2021-02-02 DIAGNOSIS — C434 Malignant melanoma of scalp and neck: Secondary | ICD-10-CM | POA: Diagnosis not present

## 2021-02-03 DIAGNOSIS — M25611 Stiffness of right shoulder, not elsewhere classified: Secondary | ICD-10-CM | POA: Diagnosis not present

## 2021-02-03 DIAGNOSIS — M6281 Muscle weakness (generalized): Secondary | ICD-10-CM | POA: Diagnosis not present

## 2021-02-03 DIAGNOSIS — Z96611 Presence of right artificial shoulder joint: Secondary | ICD-10-CM | POA: Diagnosis not present

## 2021-02-03 DIAGNOSIS — M25511 Pain in right shoulder: Secondary | ICD-10-CM | POA: Diagnosis not present

## 2021-02-05 DIAGNOSIS — Z96611 Presence of right artificial shoulder joint: Secondary | ICD-10-CM | POA: Diagnosis not present

## 2021-02-05 DIAGNOSIS — M6281 Muscle weakness (generalized): Secondary | ICD-10-CM | POA: Diagnosis not present

## 2021-02-05 DIAGNOSIS — M25511 Pain in right shoulder: Secondary | ICD-10-CM | POA: Diagnosis not present

## 2021-02-05 DIAGNOSIS — M25611 Stiffness of right shoulder, not elsewhere classified: Secondary | ICD-10-CM | POA: Diagnosis not present

## 2021-02-06 DIAGNOSIS — M6281 Muscle weakness (generalized): Secondary | ICD-10-CM | POA: Diagnosis not present

## 2021-02-06 DIAGNOSIS — M25611 Stiffness of right shoulder, not elsewhere classified: Secondary | ICD-10-CM | POA: Diagnosis not present

## 2021-02-06 DIAGNOSIS — M25511 Pain in right shoulder: Secondary | ICD-10-CM | POA: Diagnosis not present

## 2021-02-06 DIAGNOSIS — Z96611 Presence of right artificial shoulder joint: Secondary | ICD-10-CM | POA: Diagnosis not present

## 2021-02-09 DIAGNOSIS — M6281 Muscle weakness (generalized): Secondary | ICD-10-CM | POA: Diagnosis not present

## 2021-02-09 DIAGNOSIS — M25611 Stiffness of right shoulder, not elsewhere classified: Secondary | ICD-10-CM | POA: Diagnosis not present

## 2021-02-09 DIAGNOSIS — Z96611 Presence of right artificial shoulder joint: Secondary | ICD-10-CM | POA: Diagnosis not present

## 2021-02-09 DIAGNOSIS — M25511 Pain in right shoulder: Secondary | ICD-10-CM | POA: Diagnosis not present

## 2021-02-10 DIAGNOSIS — Z96611 Presence of right artificial shoulder joint: Secondary | ICD-10-CM | POA: Diagnosis not present

## 2021-02-10 DIAGNOSIS — M6281 Muscle weakness (generalized): Secondary | ICD-10-CM | POA: Diagnosis not present

## 2021-02-10 DIAGNOSIS — M19011 Primary osteoarthritis, right shoulder: Secondary | ICD-10-CM | POA: Diagnosis not present

## 2021-02-10 DIAGNOSIS — M25611 Stiffness of right shoulder, not elsewhere classified: Secondary | ICD-10-CM | POA: Diagnosis not present

## 2021-02-10 DIAGNOSIS — M25511 Pain in right shoulder: Secondary | ICD-10-CM | POA: Diagnosis not present

## 2021-02-12 DIAGNOSIS — M25511 Pain in right shoulder: Secondary | ICD-10-CM | POA: Diagnosis not present

## 2021-02-12 DIAGNOSIS — Z96611 Presence of right artificial shoulder joint: Secondary | ICD-10-CM | POA: Diagnosis not present

## 2021-02-12 DIAGNOSIS — M25611 Stiffness of right shoulder, not elsewhere classified: Secondary | ICD-10-CM | POA: Diagnosis not present

## 2021-02-12 DIAGNOSIS — M6281 Muscle weakness (generalized): Secondary | ICD-10-CM | POA: Diagnosis not present

## 2021-02-18 DIAGNOSIS — M25611 Stiffness of right shoulder, not elsewhere classified: Secondary | ICD-10-CM | POA: Diagnosis not present

## 2021-02-18 DIAGNOSIS — M6281 Muscle weakness (generalized): Secondary | ICD-10-CM | POA: Diagnosis not present

## 2021-02-18 DIAGNOSIS — M25511 Pain in right shoulder: Secondary | ICD-10-CM | POA: Diagnosis not present

## 2021-02-18 DIAGNOSIS — Z96611 Presence of right artificial shoulder joint: Secondary | ICD-10-CM | POA: Diagnosis not present

## 2021-02-19 ENCOUNTER — Other Ambulatory Visit: Payer: Self-pay | Admitting: Family Medicine

## 2021-02-19 DIAGNOSIS — Z96611 Presence of right artificial shoulder joint: Secondary | ICD-10-CM | POA: Diagnosis not present

## 2021-02-19 DIAGNOSIS — M25511 Pain in right shoulder: Secondary | ICD-10-CM | POA: Diagnosis not present

## 2021-02-19 DIAGNOSIS — M6281 Muscle weakness (generalized): Secondary | ICD-10-CM | POA: Diagnosis not present

## 2021-02-19 DIAGNOSIS — M25611 Stiffness of right shoulder, not elsewhere classified: Secondary | ICD-10-CM | POA: Diagnosis not present

## 2021-02-20 DIAGNOSIS — M25611 Stiffness of right shoulder, not elsewhere classified: Secondary | ICD-10-CM | POA: Diagnosis not present

## 2021-02-20 DIAGNOSIS — Z96611 Presence of right artificial shoulder joint: Secondary | ICD-10-CM | POA: Diagnosis not present

## 2021-02-20 DIAGNOSIS — M25511 Pain in right shoulder: Secondary | ICD-10-CM | POA: Diagnosis not present

## 2021-02-20 DIAGNOSIS — M6281 Muscle weakness (generalized): Secondary | ICD-10-CM | POA: Diagnosis not present

## 2021-02-27 DIAGNOSIS — M25511 Pain in right shoulder: Secondary | ICD-10-CM | POA: Diagnosis not present

## 2021-02-27 DIAGNOSIS — Z96611 Presence of right artificial shoulder joint: Secondary | ICD-10-CM | POA: Diagnosis not present

## 2021-02-27 DIAGNOSIS — M6281 Muscle weakness (generalized): Secondary | ICD-10-CM | POA: Diagnosis not present

## 2021-02-27 DIAGNOSIS — M25611 Stiffness of right shoulder, not elsewhere classified: Secondary | ICD-10-CM | POA: Diagnosis not present

## 2021-03-09 ENCOUNTER — Other Ambulatory Visit: Payer: Self-pay | Admitting: Family Medicine

## 2021-03-09 ENCOUNTER — Encounter: Payer: Self-pay | Admitting: Family Medicine

## 2021-03-09 ENCOUNTER — Other Ambulatory Visit: Payer: Self-pay

## 2021-03-09 DIAGNOSIS — E038 Other specified hypothyroidism: Secondary | ICD-10-CM

## 2021-03-09 MED ORDER — NP THYROID 60 MG PO TABS: 60.0000 mg | ORAL_TABLET | Freq: Every day | ORAL | 0 refills | Status: DC

## 2021-03-10 DIAGNOSIS — Z96611 Presence of right artificial shoulder joint: Secondary | ICD-10-CM | POA: Diagnosis not present

## 2021-03-10 DIAGNOSIS — M25611 Stiffness of right shoulder, not elsewhere classified: Secondary | ICD-10-CM | POA: Diagnosis not present

## 2021-03-10 DIAGNOSIS — M25511 Pain in right shoulder: Secondary | ICD-10-CM | POA: Diagnosis not present

## 2021-03-10 DIAGNOSIS — M6281 Muscle weakness (generalized): Secondary | ICD-10-CM | POA: Diagnosis not present

## 2021-03-11 DIAGNOSIS — L821 Other seborrheic keratosis: Secondary | ICD-10-CM | POA: Diagnosis not present

## 2021-03-11 DIAGNOSIS — L820 Inflamed seborrheic keratosis: Secondary | ICD-10-CM | POA: Diagnosis not present

## 2021-03-11 DIAGNOSIS — Z85828 Personal history of other malignant neoplasm of skin: Secondary | ICD-10-CM | POA: Diagnosis not present

## 2021-03-11 DIAGNOSIS — Z85820 Personal history of malignant melanoma of skin: Secondary | ICD-10-CM | POA: Diagnosis not present

## 2021-03-11 DIAGNOSIS — B078 Other viral warts: Secondary | ICD-10-CM | POA: Diagnosis not present

## 2021-03-11 DIAGNOSIS — D235 Other benign neoplasm of skin of trunk: Secondary | ICD-10-CM | POA: Diagnosis not present

## 2021-03-11 DIAGNOSIS — D485 Neoplasm of uncertain behavior of skin: Secondary | ICD-10-CM | POA: Diagnosis not present

## 2021-03-11 DIAGNOSIS — Z86008 Personal history of in-situ neoplasm of other site: Secondary | ICD-10-CM | POA: Diagnosis not present

## 2021-03-12 ENCOUNTER — Other Ambulatory Visit: Payer: Self-pay

## 2021-03-12 ENCOUNTER — Other Ambulatory Visit: Payer: Self-pay | Admitting: Family Medicine

## 2021-03-12 DIAGNOSIS — I2584 Coronary atherosclerosis due to calcified coronary lesion: Secondary | ICD-10-CM

## 2021-03-13 DIAGNOSIS — M25611 Stiffness of right shoulder, not elsewhere classified: Secondary | ICD-10-CM | POA: Diagnosis not present

## 2021-03-13 DIAGNOSIS — M6281 Muscle weakness (generalized): Secondary | ICD-10-CM | POA: Diagnosis not present

## 2021-03-13 DIAGNOSIS — M25511 Pain in right shoulder: Secondary | ICD-10-CM | POA: Diagnosis not present

## 2021-03-13 DIAGNOSIS — Z96611 Presence of right artificial shoulder joint: Secondary | ICD-10-CM | POA: Diagnosis not present

## 2021-03-16 DIAGNOSIS — C439 Malignant melanoma of skin, unspecified: Secondary | ICD-10-CM | POA: Insufficient documentation

## 2021-03-16 DIAGNOSIS — H409 Unspecified glaucoma: Secondary | ICD-10-CM | POA: Insufficient documentation

## 2021-03-16 DIAGNOSIS — H269 Unspecified cataract: Secondary | ICD-10-CM | POA: Insufficient documentation

## 2021-03-16 DIAGNOSIS — M722 Plantar fascial fibromatosis: Secondary | ICD-10-CM | POA: Insufficient documentation

## 2021-03-17 DIAGNOSIS — M25511 Pain in right shoulder: Secondary | ICD-10-CM | POA: Diagnosis not present

## 2021-03-17 DIAGNOSIS — Z96611 Presence of right artificial shoulder joint: Secondary | ICD-10-CM | POA: Diagnosis not present

## 2021-03-17 DIAGNOSIS — M25611 Stiffness of right shoulder, not elsewhere classified: Secondary | ICD-10-CM | POA: Diagnosis not present

## 2021-03-17 DIAGNOSIS — M6281 Muscle weakness (generalized): Secondary | ICD-10-CM | POA: Diagnosis not present

## 2021-03-19 DIAGNOSIS — M6281 Muscle weakness (generalized): Secondary | ICD-10-CM | POA: Diagnosis not present

## 2021-03-19 DIAGNOSIS — M25611 Stiffness of right shoulder, not elsewhere classified: Secondary | ICD-10-CM | POA: Diagnosis not present

## 2021-03-19 DIAGNOSIS — M25511 Pain in right shoulder: Secondary | ICD-10-CM | POA: Diagnosis not present

## 2021-03-19 DIAGNOSIS — Z96611 Presence of right artificial shoulder joint: Secondary | ICD-10-CM | POA: Diagnosis not present

## 2021-03-23 DIAGNOSIS — Z96611 Presence of right artificial shoulder joint: Secondary | ICD-10-CM | POA: Diagnosis not present

## 2021-03-23 DIAGNOSIS — M25511 Pain in right shoulder: Secondary | ICD-10-CM | POA: Diagnosis not present

## 2021-03-23 DIAGNOSIS — M6281 Muscle weakness (generalized): Secondary | ICD-10-CM | POA: Diagnosis not present

## 2021-03-23 DIAGNOSIS — M25611 Stiffness of right shoulder, not elsewhere classified: Secondary | ICD-10-CM | POA: Diagnosis not present

## 2021-04-01 ENCOUNTER — Encounter: Payer: Self-pay | Admitting: *Deleted

## 2021-04-08 DIAGNOSIS — M25611 Stiffness of right shoulder, not elsewhere classified: Secondary | ICD-10-CM | POA: Diagnosis not present

## 2021-04-08 DIAGNOSIS — D235 Other benign neoplasm of skin of trunk: Secondary | ICD-10-CM | POA: Diagnosis not present

## 2021-04-08 DIAGNOSIS — L570 Actinic keratosis: Secondary | ICD-10-CM | POA: Diagnosis not present

## 2021-04-08 DIAGNOSIS — M25511 Pain in right shoulder: Secondary | ICD-10-CM | POA: Diagnosis not present

## 2021-04-08 DIAGNOSIS — M6281 Muscle weakness (generalized): Secondary | ICD-10-CM | POA: Diagnosis not present

## 2021-04-08 DIAGNOSIS — Z96611 Presence of right artificial shoulder joint: Secondary | ICD-10-CM | POA: Diagnosis not present

## 2021-04-09 ENCOUNTER — Other Ambulatory Visit: Payer: Self-pay

## 2021-04-09 ENCOUNTER — Ambulatory Visit: Payer: Medicare HMO | Admitting: Cardiology

## 2021-04-09 ENCOUNTER — Encounter: Payer: Self-pay | Admitting: Family Medicine

## 2021-04-09 ENCOUNTER — Telehealth (INDEPENDENT_AMBULATORY_CARE_PROVIDER_SITE_OTHER): Payer: Medicare HMO | Admitting: Family Medicine

## 2021-04-09 ENCOUNTER — Encounter: Payer: Self-pay | Admitting: Cardiology

## 2021-04-09 VITALS — BP 124/78 | HR 106 | Ht 66.0 in | Wt 192.1 lb

## 2021-04-09 VITALS — BP 124/75 | Wt 192.0 lb

## 2021-04-09 DIAGNOSIS — B349 Viral infection, unspecified: Secondary | ICD-10-CM | POA: Diagnosis not present

## 2021-04-09 DIAGNOSIS — I2510 Atherosclerotic heart disease of native coronary artery without angina pectoris: Secondary | ICD-10-CM | POA: Insufficient documentation

## 2021-04-09 DIAGNOSIS — R072 Precordial pain: Secondary | ICD-10-CM

## 2021-04-09 DIAGNOSIS — R0600 Dyspnea, unspecified: Secondary | ICD-10-CM | POA: Diagnosis not present

## 2021-04-09 DIAGNOSIS — R0609 Other forms of dyspnea: Secondary | ICD-10-CM | POA: Insufficient documentation

## 2021-04-09 DIAGNOSIS — E782 Mixed hyperlipidemia: Secondary | ICD-10-CM

## 2021-04-09 DIAGNOSIS — E669 Obesity, unspecified: Secondary | ICD-10-CM | POA: Diagnosis not present

## 2021-04-09 DIAGNOSIS — K1370 Unspecified lesions of oral mucosa: Secondary | ICD-10-CM | POA: Diagnosis not present

## 2021-04-09 DIAGNOSIS — B370 Candidal stomatitis: Secondary | ICD-10-CM | POA: Diagnosis not present

## 2021-04-09 HISTORY — DX: Other forms of dyspnea: R06.09

## 2021-04-09 HISTORY — DX: Mixed hyperlipidemia: E78.2

## 2021-04-09 HISTORY — DX: Precordial pain: R07.2

## 2021-04-09 HISTORY — DX: Atherosclerotic heart disease of native coronary artery without angina pectoris: I25.10

## 2021-04-09 MED ORDER — CLOTRIMAZOLE 10 MG MT TROC: 10.0000 mg | Freq: Every day | OROMUCOSAL | 0 refills | Status: DC

## 2021-04-09 NOTE — Progress Notes (Signed)
Virtual Visit via Video Note  I connected with [name] on 04/09/21 at 5:26 PM by a video enabled telemedicine application and verified that I am speaking with the correct person using two identifiers.  Patient location: home  My location: office - [facility].    I discussed the limitations, risks, security and privacy concerns of performing an evaluation and management service by telephone and the availability of in person appointments. I also discussed with the patient that there may be a patient responsible charge related to this service. The patient expressed understanding and agreed to proceed, consent obtained  Chief complaint:  Chief Complaint  Patient presents with   Thrush    Pt reports pain on lips, tongue is white, dentist gave pt magic mouth wash last night, tried twice, pt reports little change     History of Present Illness: [name] [name] is a 70 y.o. female  Mouth pain, tongue coating.  Initially noted 4 days ago.  Mouth sore on way home from beach. [facility]. No sunburn. Used lip protection.  No known sick contacts. Was around 2yo to 9yo grandkids last week.  No hx of cold sores.  Tongue sensitive. Noticed white coating of tongue. Small bumps on inside of lips and end of tongue? No sores/blisters.  Called dentist yesterday. Called in magic mouthwash last night - 1 dose last night and again today. Some relief with lidocaine. ? White patches in back of mouth. Able to eat and drinking ok. No pain or difficulty with swallowing. Some nasal congestion today sinus pressure today.  Temp 100.5 this am, some chills. Neg ovid test yesterday and today  No recent abx, steroids, or steroid inhalers.    Patient Active Problem List   Diagnosis Date Noted   Precordial pain 04/09/2021   DOE (dyspnea on exertion) 04/09/2021   Coronary artery calcification seen on CAT scan 04/09/2021   Mixed hyperlipidemia 04/09/2021   Cataract 03/16/2021   Glaucoma 03/16/2021    Plantar fasciitis 03/16/2021   Skin cancer (melanoma) ([place]) 03/16/2021   Tremor of both hands 05/21/2020   Incontinence of feces    Bloating 08/22/2019   Fecal soiling 08/22/2019   Obesity (BMI 30-39.9) 11/15/2018   Anxiety about health 05/17/2018   OSA (obstructive sleep apnea) 12/27/2017   Hx of adenomatous colonic polyps 01/17/2017   Postoperative nausea and vomiting 09/02/2015   Primary localized osteoarthritis of left knee    IBS (irritable bowel syndrome)    Physical exam 05/30/2015   Hypothyroidism 11/22/2014   Vitamin D deficiency 09/21/2009   Hyperlipidemia 09/21/2009   SKIN CANCER, HX OF 09/15/2009   Anxiety state 12/19/2007   GERD 12/19/2007   ALLERGY 12/19/2007   Past Medical History:  Diagnosis Date   Allergy, unspecified not elsewhere classified    Borderline hypertension    Bronchitis    Cataract    DJD (degenerative joint disease)    GERD (gastroesophageal reflux disease)    Glaucoma    History of skin cancer    Hypercholesterolemia    Hypothyroidism    IBS (irritable bowel syndrome)    OSA (obstructive sleep apnea) 12/27/2017   no CPAP   Plantar fasciitis    PONV (postoperative nausea and vomiting)    Postoperative nausea and vomiting 09/02/2015   Primary localized osteoarthritis of left knee    Skin cancer (melanoma) ([facility])    basil cell    Vitamin D deficiency    Past Surgical History:  Procedure Laterality Date  ANAL RECTAL MANOMETRY N/A 11/16/2019   Procedure: ANO RECTAL MANOMETRY;  Surgeon: Mauri Pole, MD;  Location: [facility];  Service: Endoscopy;  Laterality: N/A;   BUNIONECTOMY  2004   left   CATARACT EXTRACTION W/ INTRAOCULAR LENS  IMPLANT, BILATERAL     COLONOSCOPY     FOOT OSTEOTOMY  2003   right   HERNIA REPAIR  1957   KNEE ARTHROSCOPY  1987&2003   left   MELANOMA EXCISION Right    shoulder   REVERSE SHOULDER ARTHROPLASTY Right 01/21/2021   Procedure: REVERSE SHOULDER ARTHROPLASTY;  Surgeon: Hiram Gash, MD;   Location: [place];  Service: Orthopedics;  Laterality: Right;   SHOULDER ARTHROSCOPY WITH OPEN ROTATOR CUFF REPAIR  2013   right   SKIN SURGERY  4/05   Melanoma surg left shoulder   TOENAIL EXCISION  10/04/2017   TOTAL KNEE ARTHROPLASTY Left 09/01/2015   Procedure: TOTAL KNEE ARTHROPLASTY;  Surgeon: Elsie Saas, MD;  Location: [place];  Service: Orthopedics;  Laterality: Left;   TUBAL LIGATION  1990   Allergies  Allergen Reactions   Clindamycin/Lincomycin Diarrhea   Flagyl [Metronidazole Hcl] Diarrhea    Stomach upset. Pt states she CANNOT TAKE oral, has no problem with topical.    Augmentin [Amoxicillin-Pot Clavulanate] Diarrhea   Telithromycin Diarrhea   Prior to Admission medications   Medication Sig Start Date End Date Taking? Authorizing Provider  aspirin 325 MG tablet Take 325 mg by mouth daily.   Yes [provider]  B Complex-C (SUPER B COMPLEX PO) Take 1 tablet by mouth daily.    Yes [provider]  brimonidine (ALPHAGAN) 0.2 % ophthalmic solution 1 drop 3 (three) times daily. 05/19/20  Yes [provider]  cetirizine (ZYRTEC) 10 MG tablet Take 10 mg by mouth daily.   Yes [provider]  Cholecalciferol (VITAMIN D3) 5000 UNITS CAPS Take 5,000 Units by mouth daily.    Yes [provider]  Coenzyme Q10 (COQ10) 200 MG CAPS Take 1 capsule by mouth daily.  10/04/13  Yes [provider]  doxycycline (ADOXA) 50 MG tablet Take 50 mg by mouth daily. For rosacea   Yes [provider]  Magnesium 200 MG TABS Take 200 mg by mouth in the morning and at bedtime.    Yes [provider]  Multiple Vitamins-Minerals (MULTIVITAMIN PO) Take 1 tablet by mouth daily.   Yes [provider]  NP THYROID 60 MG tablet Take 1 tablet (60 mg total) by mouth daily before breakfast. 03/09/21  Yes Midge Minium, MD  omeprazole (PRILOSEC) 20 MG capsule TAKE 1 CAPSULE (20 MG TOTAL) BY MOUTH DAILY. 03/12/21  Yes  Midge Minium, MD  simvastatin (ZOCOR) 20 MG tablet TAKE 1 TABLET (20 MG TOTAL) BY MOUTH AT BEDTIME. 03/12/21  Yes Midge Minium, MD  SYSTANE BALANCE 0.6 % SOLN SMARTSIG:1 Drop(s) In Eye(s) PRN 05/16/20  Yes [provider]  Travoprost, BAK Free, (TRAVATAN) 0.004 % SOLN ophthalmic solution Place 1 drop into both eyes at bedtime.  11/16/19  Yes [provider]   Social History   Socioeconomic History   Marital status: Married    Spouse name: Marya Amsler [phone]   Number of children: 2   Years of education: Not on file   Highest education level: Not on file  Occupational History   Occupation: retired  Tobacco Use   Smoking status: Former    Years: 6.00    Pack years: 0.00  Types: Cigarettes    Quit date: 10/05/1975    Years since quitting: 45.5   Smokeless tobacco: Never  Vaping Use   Vaping Use: Never used  Substance and Sexual Activity   Alcohol use: Yes    Alcohol/week: 14.0 standard drinks    Types: 12 Standard drinks or equivalent, 2 Glasses of wine per week    Comment: socially   Drug use: No   Sexual activity: Not on file  Other Topics Concern   Not on file  Social History Narrative   Not on file   Social Determinants of Health   Financial Resource Strain: Low Risk    Difficulty of Paying Living Expenses: Not hard at all  Food Insecurity: No Food Insecurity   Worried About Charity fundraiser in the Last Year: Never true   [place] in the Last Year: Never true  Transportation Needs: No Transportation Needs   Lack of Transportation (Medical): No   Lack of Transportation (Non-Medical): No  Physical Activity: Insufficiently Active   Days of Exercise per Week: 7 days   Minutes of Exercise per Session: 20 min  Stress: No Stress Concern Present   Feeling of Stress : Not at all  Social Connections: Moderately Isolated   Frequency of Communication with Friends and Family: More than three times a week   Frequency of Social Gatherings  with Friends and Family: More than three times a week   Attends Religious Services: Never   Marine scientist or Organizations: No   Attends Music therapist: Never   Marital Status: Married  Human resources officer Violence: Not At Risk   Fear of Current or Ex-Partner: No   Emotionally Abused: No   Physically Abused: No   Sexually Abused: No    Observations/Objective: Vitals:   04/09/21 1625  BP: 124/75  SpO2: 95%  Weight: 192 lb (87.1 kg)  Nontoxic appearance on video, no apparent rash on external lips.  Unable to see specific small bumps on the inside of her lip over video but I do see white patches across tongue diffusely.  On roof of mouth there were 3-4 small reddened round areas, described as small blisters by her family member using a flashlight.  Posterior oropharynx without apparent lesions with use of flashlight over video, no apparent white patches over the posterior oropharynx and uvula appears midline. Speaking in full sentences, no respiratory distress.   Assessment and Plan: Thrush  Lesion of mouth  Viral syndrome  Possible viral syndrome with differential including coxsackievirus/hand-foot-and-mouth with small bumps noted inside her lips as well as roof of the mouth, and with nasal congestion, low-grade temp as above today.  Negative initial COVID testing, recommended repeat tomorrow given chills, low-grade fever.  Symptomatic care discussed with continuing Magic mouthwash from dentist, add Mycelex atrocious for possible thrush.  In person evaluation if any worsening symptoms.  Understanding expressed  Follow Up Instructions: If not improving, sooner if worse.   I discussed the assessment and treatment plan with the patient. The patient was provided an opportunity to ask questions and all were answered. The patient agreed with the plan and demonstrated an understanding of the instructions.   The patient was advised to call back or seek an in-person  evaluation if the symptoms worsen or if the condition fails to improve as anticipated.  I provided 27 minutes of non-face-to-face time during this encounter.   Wendie Agreste, MD

## 2021-04-09 NOTE — Patient Instructions (Signed)
.  Medication Instructions:  Your physician recommends that you continue on your current medications as directed. Please refer to the Current Medication list given to you today.  *If you need a refill on your cardiac medications before your next appointment, please call your pharmacy*   Lab Work: None If you have labs (blood work) drawn today and your tests are completely normal, you will receive your results only by: [facility] (if you have MyChart) OR A paper copy in the mail If you have any lab test that is abnormal or we need to change your treatment, we will call you to review the results.   Testing/Procedures: Your physician has requested that you have en exercise stress myoview. For further information please visit [email or web address]. Please follow instruction sheet, as given.    Follow-Up: At [facility], you and your health needs are our priority.  As part of our continuing mission to provide you with exceptional heart care, we have created designated Provider Care Teams.  These Care Teams include your primary Cardiologist (physician) and Advanced Practice Providers (APPs -  Physician Assistants and Nurse Practitioners) who all work together to provide you with the care you need, when you need it.  We recommend signing up for the patient portal called "MyChart".  Sign up information is provided on this After Visit Summary.  MyChart is used to connect with patients for Virtual Visits (Telemedicine).  Patients are able to view lab/test results, encounter notes, upcoming appointments, etc.  Non-urgent messages can be sent to your provider as well.   To learn more about what you can do with MyChart, go to [email or web address].    Your next appointment:   6 month(s)  The format for your next appointment:   In Person     Other [facility] at [facility] [address], [place], [place] [address] Phone:  [phone]    Please arrive 15 minutes prior to your appointment time for registration and insurance purposes.  The test will take approximately 3 to 4 hours to complete; you may bring reading material.  If someone comes with you to your appointment, they will need to remain in the main lobby due to limited space in the testing area. **If you are pregnant or breastfeeding, please notify the nuclear lab prior to your appointment**  How to prepare for your Myocardial Perfusion Test: Do not eat or drink 3 hours prior to your test, except you may have water. Do not consume products containing caffeine (regular or decaffeinated) 12 hours prior to your test. (ex: coffee, chocolate, sodas, tea). Do bring a list of your current medications with you.  If not listed below, you may take your medications as normal. Do wear comfortable clothes (no dresses or overalls) and walking shoes, tennis shoes preferred (No heels or open toe shoes are allowed). Do NOT wear cologne, perfume, aftershave, or lotions (deodorant is allowed). If these instructions are not followed, your test will have to be rescheduled.  Please report to [address] for your test.  If you have questions or concerns about your appointment, you can call the Nuclear Lab at [phone].  If you cannot keep your appointment, please provide 24 hours notification to the Nuclear Lab, to avoid a possible $50 charge to your account.

## 2021-04-09 NOTE — Progress Notes (Signed)
Cardiology Office Note:    Date:  04/09/2021   ID:  [name] [name], DOB [birth year], MRN [id number]  PCP:  Midge Minium, MD  Cardiologist:  Berniece Salines, DO  Electrophysiologist:  None   Referring MD: Midge Minium, MD   " I have had some chest pain - and there was calcium noted on my ct scan"  History of Present Illness:    [name] [name] is a 70 y.o. female with a hx of OSA, malignant melanoma of the neck status post excision is here today at the request of her primary care doctor given an abnormal CT scan showing concern for coronary CT ossification.  The patient tells me that she has had some episodes of intermittent chest discomfort.  She describes it as a left-sided cramping sensation with no radiation.  Comes and goes.  Has a few seconds and then resolved.  She is unable to quantify.  She has no shortness of breath.  Past Medical History:  Diagnosis Date   Allergy, unspecified not elsewhere classified    Borderline hypertension    Bronchitis    Cataract    DJD (degenerative joint disease)    GERD (gastroesophageal reflux disease)    Glaucoma    History of skin cancer    Hypercholesterolemia    Hypothyroidism    IBS (irritable bowel syndrome)    OSA (obstructive sleep apnea) 12/27/2017   no CPAP   Plantar fasciitis    PONV (postoperative nausea and vomiting)    Postoperative nausea and vomiting 09/02/2015   Primary localized osteoarthritis of left knee    Skin cancer (melanoma) (HCC)    basil cell    Vitamin D deficiency     Past Surgical History:  Procedure Laterality Date   ANAL RECTAL MANOMETRY N/A 11/16/2019   Procedure: ANO RECTAL MANOMETRY;  Surgeon: Mauri Pole, MD;  Location: [facility];  Service: Endoscopy;  Laterality: N/A;   BUNIONECTOMY  2004   left   CATARACT EXTRACTION W/ INTRAOCULAR LENS  IMPLANT, BILATERAL     COLONOSCOPY     FOOT OSTEOTOMY  2003   right   HERNIA REPAIR  1957   KNEE ARTHROSCOPY  1987&2003   left    MELANOMA EXCISION Right    shoulder   REVERSE SHOULDER ARTHROPLASTY Right 01/21/2021   Procedure: REVERSE SHOULDER ARTHROPLASTY;  Surgeon: Hiram Gash, MD;  Location: [place];  Service: Orthopedics;  Laterality: Right;   SHOULDER ARTHROSCOPY WITH OPEN ROTATOR CUFF REPAIR  2013   right   SKIN SURGERY  4/05   Melanoma surg left shoulder   TOENAIL EXCISION  10/04/2017   TOTAL KNEE ARTHROPLASTY Left 09/01/2015   Procedure: TOTAL KNEE ARTHROPLASTY;  Surgeon: Elsie Saas, MD;  Location: [place];  Service: Orthopedics;  Laterality: Left;   TUBAL LIGATION  1990    Current Medications: Current Meds  Medication Sig   aspirin 325 MG tablet Take 325 mg by mouth daily.   B Complex-C (SUPER B COMPLEX PO) Take 1 tablet by mouth daily.    brimonidine (ALPHAGAN) 0.2 % ophthalmic solution 1 drop 3 (three) times daily.   cetirizine (ZYRTEC) 10 MG tablet Take 10 mg by mouth daily.   Cholecalciferol (VITAMIN D3) 5000 UNITS CAPS Take 5,000 Units by mouth daily.    Coenzyme Q10 (COQ10) 200 MG CAPS Take 1 capsule by mouth daily.    doxycycline (ADOXA) 50 MG tablet Take 50 mg by mouth daily. For rosacea   Magnesium  200 MG TABS Take 200 mg by mouth in the morning and at bedtime.    Multiple Vitamins-Minerals (MULTIVITAMIN PO) Take 1 tablet by mouth daily.   NP THYROID 60 MG tablet Take 1 tablet (60 mg total) by mouth daily before breakfast.   omeprazole (PRILOSEC) 20 MG capsule TAKE 1 CAPSULE (20 MG TOTAL) BY MOUTH DAILY.   simvastatin (ZOCOR) 20 MG tablet TAKE 1 TABLET (20 MG TOTAL) BY MOUTH AT BEDTIME.   SYSTANE BALANCE 0.6 % SOLN SMARTSIG:1 Drop(s) In Eye(s) PRN   Travoprost, BAK Free, (TRAVATAN) 0.004 % SOLN ophthalmic solution Place 1 drop into both eyes at bedtime.      Allergies:   Clindamycin/lincomycin, Flagyl [metronidazole hcl], Augmentin [amoxicillin-pot clavulanate], and Telithromycin   Social History   Socioeconomic History   Marital status: Married    Spouse name: Marya Amsler  [phone]   Number of children: 2   Years of education: Not on file   Highest education level: Not on file  Occupational History   Occupation: retired  Tobacco Use   Smoking status: Former    Years: 6.00    Pack years: 0.00    Types: Cigarettes    Quit date: 10/05/1975    Years since quitting: 45.5   Smokeless tobacco: Never  Vaping Use   Vaping Use: Never used  Substance and Sexual Activity   Alcohol use: Yes    Alcohol/week: 14.0 standard drinks    Types: 12 Standard drinks or equivalent, 2 Glasses of wine per week    Comment: socially   Drug use: No   Sexual activity: Not on file  Other Topics Concern   Not on file  Social History Narrative   Not on file   Social Determinants of Health   Financial Resource Strain: Low Risk    Difficulty of Paying Living Expenses: Not hard at all  Food Insecurity: No Food Insecurity   Worried About Charity fundraiser in the Last Year: Never true   [place] in the Last Year: Never true  Transportation Needs: No Transportation Needs   Lack of Transportation (Medical): No   Lack of Transportation (Non-Medical): No  Physical Activity: Insufficiently Active   Days of Exercise per Week: 7 days   Minutes of Exercise per Session: 20 min  Stress: No Stress Concern Present   Feeling of Stress : Not at all  Social Connections: Moderately Isolated   Frequency of Communication with Friends and Family: More than three times a week   Frequency of Social Gatherings with Friends and Family: More than three times a week   Attends Religious Services: Never   Marine scientist or Organizations: No   Attends Music therapist: Never   Marital Status: Married     Family History: The patient's family history includes Cancer in her father; Colon polyps in her father; Diabetes in her son; Heart disease in her father, maternal grandfather, and paternal grandfather; Hypertension in her brother; Osteoporosis in her sister. There  is no history of Colon cancer or Stomach cancer.  ROS:   Review of Systems  Constitution: Negative for decreased appetite, fever and weight gain.  HENT: Negative for congestion, ear discharge, hoarse voice and sore throat.   Eyes: Negative for discharge, redness, vision loss in right eye and visual halos.  Cardiovascular: Reports chest pain.negative for dyspnea on exertion, leg swelling, orthopnea and palpitations.  Respiratory: Negative for cough, hemoptysis, shortness of breath and snoring.   Endocrine: Negative for  heat intolerance and polyphagia.  Hematologic/Lymphatic: Negative for bleeding problem. Does not bruise/bleed easily.  Skin: Negative for flushing, nail changes, rash and suspicious lesions.  Musculoskeletal: Negative for arthritis, joint pain, muscle cramps, myalgias, neck pain and stiffness.  Gastrointestinal: Negative for abdominal pain, bowel incontinence, diarrhea and excessive appetite.  Genitourinary: Negative for decreased libido, genital sores and incomplete emptying.  Neurological: Negative for brief paralysis, focal weakness, headaches and loss of balance.  Psychiatric/Behavioral: Negative for altered mental status, depression and suicidal ideas.  Allergic/Immunologic: Negative for HIV exposure and persistent infections.    EKGs/Labs/Other Studies Reviewed:    The following studies were reviewed today:   EKG:  The ekg ordered today demonstrates sinus tachycardia, heart rate 106 bpm.  CT scan done Feb 02, 2021 Thoracic inlet/central airways: Thyroid normal. Airway patent.   Mediastinum/hila/axilla: No adenopathy. Small hiatal hernia.   Heart/vessels: Normal heart size. No pericardial effusion. Aorta normal in caliber. Heavy coronary artery calcifications.   Lungs/pleura: Similar appearance of bilateral solid nodules, for example:  5 mm left upper lobe nodule (series 2 image 44),  4 mm left lower lobe perifissural nodule (series 2 image 107), and  5 mm  nodule of the right middle lobe (series 2 image 117).  Linear atelectasis of the right lower lobe.  No pleural effusion or pneumothorax.   Upper abdomen: Unremarkable.   Pharmacologic nuclear stress test done March 15, 2017   Nuclear stress EF: 64%. Blood pressure demonstrated a normal response to exercise. There was no ST segment deviation noted during stress. The study is normal. This is a low risk study. The left ventricular ejection fraction is normal (55-65%).  Breast attenuation no ischemia or infarct EF 64%  Echocardiogram done March 15, 2017 Study Conclusions   - Left ventricle: The cavity size was normal. Wall thickness was    normal. Systolic function was normal. The estimated ejection    fraction was in the range of 55% to 60%. Wall motion was normal;    there were no regional wall motion abnormalities. Doppler    parameters are consistent with abnormal left ventricular    relaxation (grade 1 diastolic dysfunction).  - Left atrium: The atrium was at the upper limits of normal in    size.  - Tricuspid valve: There was mild regurgitation.   Recent Labs: 11/20/2020: ALT 26; BUN 19; Creatinine, Ser 0.85; Hemoglobin 12.9; Platelets 339.0; Potassium 3.9; Sodium 139; TSH 1.27  Recent Lipid Panel    Component Value Date/Time   CHOL 167 11/20/2020 0842   TRIG 165.0 (H) 11/20/2020 0842   HDL 58.60 11/20/2020 0842   CHOLHDL 3 11/20/2020 0842   VLDL 33.0 11/20/2020 0842   LDLCALC 76 11/20/2020 0842    Physical Exam:    VS:  BP 124/78 (BP Location: Right Arm, Patient Position: Sitting, Cuff Size: Normal)   Pulse (!) 106   Ht 5\' 6"  (1.676 m)   Wt 192 lb 1.9 oz (87.1 kg)   SpO2 95%   BMI 31.01 kg/m     Wt Readings from Last 3 Encounters:  04/09/21 192 lb (87.1 kg)  04/09/21 192 lb 1.9 oz (87.1 kg)  01/21/21 192 lb 14.4 oz (87.5 kg)     GEN: Well nourished, well developed in no acute distress HEENT: Normal NECK: No JVD; No carotid bruits LYMPHATICS: No  lymphadenopathy CARDIAC: S1S2 noted,RRR, no murmurs, rubs, gallops RESPIRATORY:  Clear to auscultation without rales, wheezing or rhonchi  ABDOMEN: Soft, non-tender, non-distended, +bowel sounds, no guarding. EXTREMITIES:  No edema, No cyanosis, no clubbing MUSCULOSKELETAL:  No deformity  SKIN: Warm and dry NEUROLOGIC:  Alert and oriented x 3, non-focal PSYCHIATRIC:  Normal affect, good insight  ASSESSMENT:    1. Precordial pain   2. DOE (dyspnea on exertion)   3. Coronary artery calcification seen on CAT scan   4. Mixed hyperlipidemia   5. Obesity (BMI 30-39.9)    PLAN:     She is experiencing some chest discomfort in light of her symptoms and heavy Coronary calcification it will be best to pursue an ischemic evaluation.  I reviewed her prior nuclear stress test which was done in June 2018 did not show any evidence of ischemia.  An exercise nuclear stress test will be appropriate at this time.  I also reviewed her echocardiogram there is no need for repeat study here.  She takes aspirin and statin we will continue this for now.  The patient understands the need to lose weight with diet and exercise. We have discussed specific strategies for this.  The patient is in agreement with the above plan. The patient left the office in stable condition.  The patient will follow up in   Medication Adjustments/Labs and Tests Ordered: Current medicines are reviewed at length with the patient today.  Concerns regarding medicines are outlined above.  Orders Placed This Encounter  Procedures   MYOCARDIAL PERFUSION IMAGING   EKG 12-Lead   No orders of the defined types were placed in this encounter.   Patient Instructions  .Medication Instructions:  Your physician recommends that you continue on your current medications as directed. Please refer to the Current Medication list given to you today.  *If you need a refill on your cardiac medications before your next appointment, please call  your pharmacy*   Lab Work: None If you have labs (blood work) drawn today and your tests are completely normal, you will receive your results only by: [facility] (if you have MyChart) OR A paper copy in the mail If you have any lab test that is abnormal or we need to change your treatment, we will call you to review the results.   Testing/Procedures: Your physician has requested that you have en exercise stress myoview. For further information please visit [email or web address]. Please follow instruction sheet, as given.    Follow-Up: At [facility], you and your health needs are our priority.  As part of our continuing mission to provide you with exceptional heart care, we have created designated Provider Care Teams.  These Care Teams include your primary Cardiologist (physician) and Advanced Practice Providers (APPs -  Physician Assistants and Nurse Practitioners) who all work together to provide you with the care you need, when you need it.  We recommend signing up for the patient portal called "MyChart".  Sign up information is provided on this After Visit Summary.  MyChart is used to connect with patients for Virtual Visits (Telemedicine).  Patients are able to view lab/test results, encounter notes, upcoming appointments, etc.  Non-urgent messages can be sent to your provider as well.   To learn more about what you can do with MyChart, go to [email or web address].    Your next appointment:   6 month(s)  The format for your next appointment:   In Person     Other [facility] at [facility] [address] [place], [place] [address] Phone: [phone]    Please arrive 15 minutes prior to your appointment time for registration  and insurance purposes.  The test will take approximately 3 to 4 hours to complete; you may bring reading material.  If someone comes with you to your appointment, they will need to  remain in the main lobby due to limited space in the testing area. **If you are pregnant or breastfeeding, please notify the nuclear lab prior to your appointment**  How to prepare for your Myocardial Perfusion Test: Do not eat or drink 3 hours prior to your test, except you may have water. Do not consume products containing caffeine (regular or decaffeinated) 12 hours prior to your test. (ex: coffee, chocolate, sodas, tea). Do bring a list of your current medications with you.  If not listed below, you may take your medications as normal. Do wear comfortable clothes (no dresses or overalls) and walking shoes, tennis shoes preferred (No heels or open toe shoes are allowed). Do NOT wear cologne, perfume, aftershave, or lotions (deodorant is allowed). If these instructions are not followed, your test will have to be rescheduled.  Please report to [address] for your test.  If you have questions or concerns about your appointment, you can call the Nuclear Lab at [phone].  If you cannot keep your appointment, please provide 24 hours notification to the Nuclear Lab, to avoid a possible $50 charge to your account.    Adopting a Healthy Lifestyle.  Know what a healthy weight is for you (roughly BMI <25) and aim to maintain this   Aim for 7+ servings of fruits and vegetables daily   65-80+ fluid ounces of water or unsweet tea for healthy kidneys   Limit to max 1 drink of alcohol per day; avoid smoking/tobacco   Limit animal fats in diet for cholesterol and heart health - choose grass fed whenever available   Avoid highly processed foods, and foods high in saturated/trans fats   Aim for low stress - take time to unwind and care for your mental health   Aim for 150 min of moderate intensity exercise weekly for heart health, and weights twice weekly for bone health   Aim for 7-9 hours of sleep daily   When it comes to diets, agreement about the perfect plan isnt  easy to find, even among the experts. Experts at the [employer] developed an idea known as the Healthy Eating Plate. Just imagine a plate divided into logical, healthy portions.   The emphasis is on diet quality:   Load up on vegetables and fruits - one-half of your plate: Aim for color and variety, and remember that potatoes dont count.   Go for whole grains - one-quarter of your plate: Whole wheat, barley, wheat berries, quinoa, oats, brown rice, and foods made with them. If you want pasta, go with whole wheat pasta.   Protein power - one-quarter of your plate: Fish, chicken, beans, and nuts are all healthy, versatile protein sources. Limit red meat.   The diet, however, does go beyond the plate, offering a few other suggestions.   Use healthy plant oils, such as olive, canola, soy, corn, sunflower and peanut. Check the labels, and avoid partially hydrogenated oil, which have unhealthy trans fats.   If youre thirsty, drink water. Coffee and tea are good in moderation, but skip sugary drinks and limit milk and dairy products to one or two daily servings.   The type of carbohydrate in the diet is more important than the amount. Some sources of carbohydrates, such as vegetables,  fruits, whole grains, and beans-are healthier than others.   Finally, stay active  Signed, Berniece Salines, DO  04/09/2021 5:16 PM    [facility]

## 2021-04-10 ENCOUNTER — Encounter: Payer: Self-pay | Admitting: Family Medicine

## 2021-04-10 DIAGNOSIS — U071 COVID-19: Secondary | ICD-10-CM | POA: Diagnosis not present

## 2021-04-10 NOTE — Telephone Encounter (Signed)
Called patient  Positive covid test today x2. Saw other clinic in [facility] this afternoon - was prescribed paxlovid. Has Rx paxlovid to start tonight. Advised to stop statin while on paxlovid. No other apparent contraindications. Symptomatic care and ER precautions discussed with understanding expressed.

## 2021-04-15 ENCOUNTER — Telehealth (HOSPITAL_COMMUNITY): Payer: Self-pay | Admitting: *Deleted

## 2021-04-15 ENCOUNTER — Encounter (HOSPITAL_COMMUNITY): Payer: Self-pay | Admitting: *Deleted

## 2021-04-15 NOTE — Telephone Encounter (Signed)
Patient given detailed instructions per Myocardial Perfusion Study Information Sheet for the test on 04/22/21 at 0730. Patient notified to arrive 15 minutes early and that it is imperative to arrive on time for appointment to keep from having the test rescheduled.  If you need to cancel or reschedule your appointment, please call the office within 24 hours of your appointment. . Patient verbalized understanding.[facility] Mychart letter sent

## 2021-04-16 ENCOUNTER — Encounter: Payer: Self-pay | Admitting: Family Medicine

## 2021-04-17 ENCOUNTER — Encounter: Payer: Self-pay | Admitting: Family Medicine

## 2021-04-17 MED ORDER — CHLORHEXIDINE GLUCONATE 0.12 % MT SOLN: 15.0000 mL | Freq: Two times a day (BID) | OROMUCOSAL | 0 refills | Status: DC

## 2021-04-20 ENCOUNTER — Encounter: Payer: Self-pay | Admitting: Family Medicine

## 2021-04-20 ENCOUNTER — Telehealth (INDEPENDENT_AMBULATORY_CARE_PROVIDER_SITE_OTHER): Payer: Medicare HMO | Admitting: Family Medicine

## 2021-04-20 VITALS — Wt 186.0 lb

## 2021-04-20 DIAGNOSIS — M25611 Stiffness of right shoulder, not elsewhere classified: Secondary | ICD-10-CM | POA: Diagnosis not present

## 2021-04-20 DIAGNOSIS — M25511 Pain in right shoulder: Secondary | ICD-10-CM | POA: Diagnosis not present

## 2021-04-20 DIAGNOSIS — Z96611 Presence of right artificial shoulder joint: Secondary | ICD-10-CM | POA: Diagnosis not present

## 2021-04-20 DIAGNOSIS — K1370 Unspecified lesions of oral mucosa: Secondary | ICD-10-CM | POA: Diagnosis not present

## 2021-04-20 DIAGNOSIS — B370 Candidal stomatitis: Secondary | ICD-10-CM | POA: Diagnosis not present

## 2021-04-20 DIAGNOSIS — U071 COVID-19: Secondary | ICD-10-CM

## 2021-04-20 DIAGNOSIS — M6281 Muscle weakness (generalized): Secondary | ICD-10-CM | POA: Diagnosis not present

## 2021-04-20 NOTE — Progress Notes (Signed)
Virtual Visit via Video Note  I connected with [name] on 04/20/21 at 5:17 PM by a video enabled telemedicine application and verified that I am speaking with the correct person using two identifiers.  Patient location: home  My location: office - [place].    I discussed the limitations, risks, security and privacy concerns of performing an evaluation and management service by telephone and the availability of in person appointments. I also discussed with the patient that there may be a patient responsible charge related to this service. The patient expressed understanding and agreed to proceed, consent obtained  Chief complaint:  Chief Complaint  Patient presents with   Thrush    Pt reports doing follow up today for white coating on tongue, pt reports to be doing somewhat better but not completely gone     History of Present Illness: [name] [name] is a 70 y.o. female  Suspected thrush Video visit 11 days ago, possible viral syndrome at that time with differential including coxsackievirus, but did have white coating from tongue.  Had been using Magic mouthwash from her dentist but we added Mycelex troche.  Unfortunately she did have a positive COVID test next day, treated with antivirals.    Has been doing much better. Completed antiviral, congestion improved breathing well. Slight residual cough but improving.   She has remained on Mycelex 5 times per day for about 8 days. Stopped after starting new mouth rinse chlorhexidine by Dr. Birdie Riddle on 7/15. White coating has improved but not completely resolved. Minimal white coating and few bumps in roof of mouth and tongue. No difficulty swallowing. Minimal sore throat, but swallowing normally, less osre now.  Patient Active Problem List   Diagnosis Date Noted   Precordial pain 04/09/2021   DOE (dyspnea on exertion) 04/09/2021   Coronary artery calcification seen on CAT scan 04/09/2021   Mixed hyperlipidemia 04/09/2021    Cataract 03/16/2021   Glaucoma 03/16/2021   Plantar fasciitis 03/16/2021   Skin cancer (melanoma) ([place]) 03/16/2021   Tremor of both hands 05/21/2020   Incontinence of feces    Bloating 08/22/2019   Fecal soiling 08/22/2019   Obesity (BMI 30-39.9) 11/15/2018   Anxiety about health 05/17/2018   OSA (obstructive sleep apnea) 12/27/2017   Hx of adenomatous colonic polyps 01/17/2017   Postoperative nausea and vomiting 09/02/2015   Primary localized osteoarthritis of left knee    IBS (irritable bowel syndrome)    Physical exam 05/30/2015   Hypothyroidism 11/22/2014   Vitamin D deficiency 09/21/2009   Hyperlipidemia 09/21/2009   SKIN CANCER, HX OF 09/15/2009   Anxiety state 12/19/2007   GERD 12/19/2007   ALLERGY 12/19/2007   Past Medical History:  Diagnosis Date   Allergy, unspecified not elsewhere classified    Borderline hypertension    Bronchitis    Cataract    DJD (degenerative joint disease)    GERD (gastroesophageal reflux disease)    Glaucoma    History of skin cancer    Hypercholesterolemia    Hypothyroidism    IBS (irritable bowel syndrome)    OSA (obstructive sleep apnea) 12/27/2017   no CPAP   Plantar fasciitis    PONV (postoperative nausea and vomiting)    Postoperative nausea and vomiting 09/02/2015   Primary localized osteoarthritis of left knee    Skin cancer (melanoma) ([facility])    basil cell    Vitamin D deficiency    Past Surgical History:  Procedure Laterality Date   ANAL RECTAL MANOMETRY N/A 11/16/2019  Procedure: ANO RECTAL MANOMETRY;  Surgeon: Mauri Pole, MD;  Location: [facility];  Service: Endoscopy;  Laterality: N/A;   BUNIONECTOMY  2004   left   CATARACT EXTRACTION W/ INTRAOCULAR LENS  IMPLANT, BILATERAL     COLONOSCOPY     FOOT OSTEOTOMY  2003   right   HERNIA REPAIR  1957   KNEE ARTHROSCOPY  1987&2003   left   MELANOMA EXCISION Right    shoulder   REVERSE SHOULDER ARTHROPLASTY Right 01/21/2021   Procedure: REVERSE SHOULDER  ARTHROPLASTY;  Surgeon: Hiram Gash, MD;  Location: [place];  Service: Orthopedics;  Laterality: Right;   SHOULDER ARTHROSCOPY WITH OPEN ROTATOR CUFF REPAIR  2013   right   SKIN SURGERY  4/05   Melanoma surg left shoulder   TOENAIL EXCISION  10/04/2017   TOTAL KNEE ARTHROPLASTY Left 09/01/2015   Procedure: TOTAL KNEE ARTHROPLASTY;  Surgeon: Elsie Saas, MD;  Location: [place];  Service: Orthopedics;  Laterality: Left;   TUBAL LIGATION  1990   Allergies  Allergen Reactions   Clindamycin/Lincomycin Diarrhea   Flagyl [Metronidazole Hcl] Diarrhea    Stomach upset. Pt states she CANNOT TAKE oral, has no problem with topical.    Augmentin [Amoxicillin-Pot Clavulanate] Diarrhea   Telithromycin Diarrhea   Prior to Admission medications   Medication Sig Start Date End Date Taking? Authorizing Provider  aspirin 325 MG tablet Take 325 mg by mouth daily.   Yes [provider]  B Complex-C (SUPER B COMPLEX PO) Take 1 tablet by mouth daily.    Yes [provider]  brimonidine (ALPHAGAN) 0.2 % ophthalmic solution 1 drop 3 (three) times daily. 05/19/20  Yes [provider]  cetirizine (ZYRTEC) 10 MG tablet Take 10 mg by mouth daily.   Yes [provider]  chlorhexidine (PERIDEX) 0.12 % solution Use as directed 15 mLs in the mouth or throat 2 (two) times daily. 04/17/21  Yes Midge Minium, MD  Cholecalciferol (VITAMIN D3) 5000 UNITS CAPS Take 5,000 Units by mouth daily.    Yes [provider]  clotrimazole (MYCELEX) 10 MG troche Take 1 tablet (10 mg total) by mouth 5 (five) times daily. 04/09/21  Yes Wendie Agreste, MD  Coenzyme Q10 (COQ10) 200 MG CAPS Take 1 capsule by mouth daily.  10/04/13  Yes [provider]  doxycycline (ADOXA) 50 MG tablet Take 50 mg by mouth daily. For rosacea   Yes [provider]  Magnesium 200 MG TABS Take 200 mg by mouth in the morning and at bedtime.    Yes [provider]   Multiple Vitamins-Minerals (MULTIVITAMIN PO) Take 1 tablet by mouth daily.   Yes [provider]  NP THYROID 60 MG tablet Take 1 tablet (60 mg total) by mouth daily before breakfast. 03/09/21  Yes Midge Minium, MD  omeprazole (PRILOSEC) 20 MG capsule TAKE 1 CAPSULE (20 MG TOTAL) BY MOUTH DAILY. 03/12/21  Yes Midge Minium, MD  simvastatin (ZOCOR) 20 MG tablet TAKE 1 TABLET (20 MG TOTAL) BY MOUTH AT BEDTIME. 03/12/21  Yes Midge Minium, MD  SYSTANE BALANCE 0.6 % SOLN SMARTSIG:1 Drop(s) In Eye(s) PRN 05/16/20  Yes [provider]  Travoprost, BAK Free, (TRAVATAN) 0.004 % SOLN ophthalmic solution Place 1 drop into both eyes at bedtime.  11/16/19  Yes [provider]   Social History   Socioeconomic History   Marital status: Married    Spouse name: Marya Amsler [phone]   Number of children: 2  Years of education: Not on file   Highest education level: Not on file  Occupational History   Occupation: retired  Tobacco Use   Smoking status: Former    Years: 6.00    Types: Cigarettes    Quit date: 10/05/1975    Years since quitting: 45.5   Smokeless tobacco: Never  Vaping Use   Vaping Use: Never used  Substance and Sexual Activity   Alcohol use: Yes    Alcohol/week: 14.0 standard drinks    Types: 12 Standard drinks or equivalent, 2 Glasses of wine per week    Comment: socially   Drug use: No   Sexual activity: Not on file  Other Topics Concern   Not on file  Social History Narrative   Not on file   Social Determinants of Health   Financial Resource Strain: Low Risk    Difficulty of Paying Living Expenses: Not hard at all  Food Insecurity: No Food Insecurity   Worried About Charity fundraiser in the Last Year: Never true   [place] in the Last Year: Never true  Transportation Needs: No Transportation Needs   Lack of Transportation (Medical): No   Lack of Transportation (Non-Medical): No  Physical Activity: Insufficiently Active   Days  of Exercise per Week: 7 days   Minutes of Exercise per Session: 20 min  Stress: No Stress Concern Present   Feeling of Stress : Not at all  Social Connections: Moderately Isolated   Frequency of Communication with Friends and Family: More than three times a week   Frequency of Social Gatherings with Friends and Family: More than three times a week   Attends Religious Services: Never   Marine scientist or Organizations: No   Attends Music therapist: Never   Marital Status: Married  Human resources officer Violence: Not At Risk   Fear of Current or Ex-Partner: No   Emotionally Abused: No   Physically Abused: No   Sexually Abused: No    Observations/Objective: Vitals:   04/20/21 1602  SpO2: 99%  Weight: 186 lb (84.4 kg)  Nontoxic appearance over video, speaking normally without audible stridor, or hoarseness.  Attempt at oropharyngeal exam over video with flashlight.  Possible slight amount of white coating on dorsum of tongue but none seen on the posterior oropharynx.  I did not appreciate any specific blisters or other oral lesions, but limited over video with resolution for the posterior oropharynx.  Speaking in full sentences, no respiratory distress, appropriate responses, all questions were answered with understanding plan expressed.   Assessment and Plan: COVID-19 virus infection  Thrush  Lesion of mouth  COVID-19 infection symptoms have improved status post antiviral.  RTC/urgent care/ER precautions given but anticipate continued improvement.  Possible thrush versus viral syndrome was previously discussed with reported bumps/oral lesions.  Some improvement with chlorhexidine, but still with some white coating.  Overall improving.   -recommended restart of clotrimazole and continue until finished.  If not resolved after completion of clotrimazole and use of chlorhexidine this week, recommend in person evaluation.  Sooner if worsening or new symptoms.  Follow Up  Instructions: In person eval in the next 1 week if not improved, sooner if worse   I discussed the assessment and treatment plan with the patient. The patient was provided an opportunity to ask questions and all were answered. The patient agreed with the plan and demonstrated an understanding of the instructions.   The patient was advised to call back  or seek an in-person evaluation if the symptoms worsen or if the condition fails to improve as anticipated.  I provided 20 minutes of non-face-to-face time during this encounter.   Wendie Agreste, MD

## 2021-04-21 DIAGNOSIS — H18413 Arcus senilis, bilateral: Secondary | ICD-10-CM | POA: Diagnosis not present

## 2021-04-21 DIAGNOSIS — D235 Other benign neoplasm of skin of trunk: Secondary | ICD-10-CM | POA: Diagnosis not present

## 2021-04-21 DIAGNOSIS — H35433 Paving stone degeneration of retina, bilateral: Secondary | ICD-10-CM | POA: Diagnosis not present

## 2021-04-21 DIAGNOSIS — H401132 Primary open-angle glaucoma, bilateral, moderate stage: Secondary | ICD-10-CM | POA: Diagnosis not present

## 2021-04-21 DIAGNOSIS — Z961 Presence of intraocular lens: Secondary | ICD-10-CM | POA: Diagnosis not present

## 2021-04-22 ENCOUNTER — Ambulatory Visit (HOSPITAL_COMMUNITY): Payer: Medicare HMO | Attending: Cardiology

## 2021-04-22 ENCOUNTER — Other Ambulatory Visit: Payer: Self-pay

## 2021-04-22 DIAGNOSIS — R0609 Other forms of dyspnea: Secondary | ICD-10-CM

## 2021-04-22 DIAGNOSIS — R072 Precordial pain: Secondary | ICD-10-CM | POA: Diagnosis not present

## 2021-04-22 DIAGNOSIS — R0600 Dyspnea, unspecified: Secondary | ICD-10-CM | POA: Diagnosis not present

## 2021-04-22 LAB — MYOCARDIAL PERFUSION IMAGING
Estimated workload: 7 METS
Exercise duration (min): 5 min
Exercise duration (sec): 3 s
LV dias vol: 62 mL (ref 46–106)
LV sys vol: 20 mL
MPHR: 151 {beats}/min
Peak HR: 166 {beats}/min
Percent HR: 109 %
RPE: 18
Rest HR: 61 {beats}/min
SDS: 0
SRS: 0
SSS: 0
TID: 1.15

## 2021-04-22 MED ORDER — TECHNETIUM TC 99M TETROFOSMIN IV KIT
30.7000 | PACK | Freq: Once | INTRAVENOUS | Status: AC | PRN
  Administered 2021-04-22: 30.7 via INTRAVENOUS
  Filled 2021-04-22: qty 31

## 2021-04-22 MED ORDER — TECHNETIUM TC 99M TETROFOSMIN IV KIT
10.9000 | PACK | Freq: Once | INTRAVENOUS | Status: AC | PRN
  Administered 2021-04-22: 10.9 via INTRAVENOUS
  Filled 2021-04-22: qty 11

## 2021-04-23 ENCOUNTER — Telehealth: Payer: Self-pay

## 2021-04-23 DIAGNOSIS — M25611 Stiffness of right shoulder, not elsewhere classified: Secondary | ICD-10-CM | POA: Diagnosis not present

## 2021-04-23 DIAGNOSIS — Z96611 Presence of right artificial shoulder joint: Secondary | ICD-10-CM | POA: Diagnosis not present

## 2021-04-23 DIAGNOSIS — M19011 Primary osteoarthritis, right shoulder: Secondary | ICD-10-CM | POA: Diagnosis not present

## 2021-04-23 DIAGNOSIS — M6281 Muscle weakness (generalized): Secondary | ICD-10-CM | POA: Diagnosis not present

## 2021-04-23 DIAGNOSIS — M25511 Pain in right shoulder: Secondary | ICD-10-CM | POA: Diagnosis not present

## 2021-04-23 NOTE — Telephone Encounter (Signed)
Spoke with patient regarding results and recommendation.  Patient verbalizes understanding and is agreeable to plan of care. Advised patient to call back with any issues or concerns.  

## 2021-04-23 NOTE — Telephone Encounter (Signed)
-----   Message from Richardo Priest, MD sent at 04/23/2021  1:25 PM EDT ----- Normal or stable result  Normal Myoview good result

## 2021-05-01 DIAGNOSIS — Z96611 Presence of right artificial shoulder joint: Secondary | ICD-10-CM | POA: Diagnosis not present

## 2021-05-01 DIAGNOSIS — M25611 Stiffness of right shoulder, not elsewhere classified: Secondary | ICD-10-CM | POA: Diagnosis not present

## 2021-05-01 DIAGNOSIS — M25511 Pain in right shoulder: Secondary | ICD-10-CM | POA: Diagnosis not present

## 2021-05-01 DIAGNOSIS — M6281 Muscle weakness (generalized): Secondary | ICD-10-CM | POA: Diagnosis not present

## 2021-05-08 DIAGNOSIS — M25511 Pain in right shoulder: Secondary | ICD-10-CM | POA: Diagnosis not present

## 2021-05-08 DIAGNOSIS — Z96611 Presence of right artificial shoulder joint: Secondary | ICD-10-CM | POA: Diagnosis not present

## 2021-05-08 DIAGNOSIS — M25611 Stiffness of right shoulder, not elsewhere classified: Secondary | ICD-10-CM | POA: Diagnosis not present

## 2021-05-08 DIAGNOSIS — M6281 Muscle weakness (generalized): Secondary | ICD-10-CM | POA: Diagnosis not present

## 2021-05-11 DIAGNOSIS — M25511 Pain in right shoulder: Secondary | ICD-10-CM | POA: Diagnosis not present

## 2021-05-11 DIAGNOSIS — M25611 Stiffness of right shoulder, not elsewhere classified: Secondary | ICD-10-CM | POA: Diagnosis not present

## 2021-05-11 DIAGNOSIS — M6281 Muscle weakness (generalized): Secondary | ICD-10-CM | POA: Diagnosis not present

## 2021-05-11 DIAGNOSIS — Z96611 Presence of right artificial shoulder joint: Secondary | ICD-10-CM | POA: Diagnosis not present

## 2021-05-13 DIAGNOSIS — Z96611 Presence of right artificial shoulder joint: Secondary | ICD-10-CM | POA: Diagnosis not present

## 2021-05-13 DIAGNOSIS — M25511 Pain in right shoulder: Secondary | ICD-10-CM | POA: Diagnosis not present

## 2021-05-13 DIAGNOSIS — M6281 Muscle weakness (generalized): Secondary | ICD-10-CM | POA: Diagnosis not present

## 2021-05-13 DIAGNOSIS — M25611 Stiffness of right shoulder, not elsewhere classified: Secondary | ICD-10-CM | POA: Diagnosis not present

## 2021-05-18 DIAGNOSIS — L720 Epidermal cyst: Secondary | ICD-10-CM | POA: Diagnosis not present

## 2021-05-18 DIAGNOSIS — M25611 Stiffness of right shoulder, not elsewhere classified: Secondary | ICD-10-CM | POA: Diagnosis not present

## 2021-05-18 DIAGNOSIS — M6281 Muscle weakness (generalized): Secondary | ICD-10-CM | POA: Diagnosis not present

## 2021-05-18 DIAGNOSIS — L821 Other seborrheic keratosis: Secondary | ICD-10-CM | POA: Diagnosis not present

## 2021-05-18 DIAGNOSIS — Z96611 Presence of right artificial shoulder joint: Secondary | ICD-10-CM | POA: Diagnosis not present

## 2021-05-18 DIAGNOSIS — M25511 Pain in right shoulder: Secondary | ICD-10-CM | POA: Diagnosis not present

## 2021-05-20 ENCOUNTER — Encounter: Payer: Self-pay | Admitting: Registered Nurse

## 2021-05-20 ENCOUNTER — Ambulatory Visit: Payer: Medicare HMO | Admitting: Family Medicine

## 2021-05-20 ENCOUNTER — Ambulatory Visit (INDEPENDENT_AMBULATORY_CARE_PROVIDER_SITE_OTHER): Payer: Medicare HMO | Admitting: Registered Nurse

## 2021-05-20 ENCOUNTER — Other Ambulatory Visit: Payer: Self-pay

## 2021-05-20 VITALS — BP 132/74 | HR 69 | Temp 98.0°F | Resp 18 | Ht 66.0 in | Wt 193.4 lb

## 2021-05-20 DIAGNOSIS — Z23 Encounter for immunization: Secondary | ICD-10-CM | POA: Diagnosis not present

## 2021-05-20 DIAGNOSIS — K219 Gastro-esophageal reflux disease without esophagitis: Secondary | ICD-10-CM

## 2021-05-20 DIAGNOSIS — E669 Obesity, unspecified: Secondary | ICD-10-CM | POA: Diagnosis not present

## 2021-05-20 DIAGNOSIS — I2510 Atherosclerotic heart disease of native coronary artery without angina pectoris: Secondary | ICD-10-CM

## 2021-05-20 DIAGNOSIS — E785 Hyperlipidemia, unspecified: Secondary | ICD-10-CM

## 2021-05-20 DIAGNOSIS — B370 Candidal stomatitis: Secondary | ICD-10-CM

## 2021-05-20 DIAGNOSIS — I2584 Coronary atherosclerosis due to calcified coronary lesion: Secondary | ICD-10-CM | POA: Diagnosis not present

## 2021-05-20 LAB — COMPREHENSIVE METABOLIC PANEL
ALT: 22 U/L (ref 0–35)
AST: 23 U/L (ref 0–37)
Albumin: 4.4 g/dL (ref 3.5–5.2)
Alkaline Phosphatase: 97 U/L (ref 39–117)
BUN: 17 mg/dL (ref 6–23)
CO2: 28 mEq/L (ref 19–32)
Calcium: 9.7 mg/dL (ref 8.4–10.5)
Chloride: 102 mEq/L (ref 96–112)
Creatinine, Ser: 0.78 mg/dL (ref 0.40–1.20)
GFR: 77.47 mL/min (ref >60.00)
Glucose, Bld: 99 mg/dL (ref 70–99)
Potassium: 4.3 mEq/L (ref 3.5–5.1)
Sodium: 139 mEq/L (ref 135–145)
Total Bilirubin: 0.7 mg/dL (ref 0.2–1.2)
Total Protein: 7.3 g/dL (ref 6.0–8.3)

## 2021-05-20 LAB — CBC WITH DIFFERENTIAL/PLATELET
Basophils Absolute: 0 10*3/uL (ref 0.0–0.1)
Basophils Relative: 0.8 % (ref 0.0–3.0)
Eosinophils Absolute: 0.1 10*3/uL (ref 0.0–0.7)
Eosinophils Relative: 1.7 % (ref 0.0–5.0)
HCT: 41.1 % (ref 36.0–46.0)
Hemoglobin: 13.3 g/dL (ref 12.0–15.0)
Lymphocytes Relative: 34.2 % (ref 12.0–46.0)
Lymphs Abs: 2 10*3/uL (ref 0.7–4.0)
MCHC: 32.4 g/dL (ref 30.0–36.0)
MCV: 80.6 fl (ref 78.0–100.0)
Monocytes Absolute: 0.4 10*3/uL (ref 0.1–1.0)
Monocytes Relative: 7.2 % (ref 3.0–12.0)
Neutro Abs: 3.3 10*3/uL (ref 1.4–7.7)
Neutrophils Relative %: 56.1 % (ref 43.0–77.0)
Platelets: 241 10*3/uL (ref 150.0–400.0)
RBC: 5.1 Mil/uL (ref 3.87–5.11)
RDW: 17.6 % — ABNORMAL HIGH (ref 11.5–15.5)
WBC: 5.9 10*3/uL (ref 4.0–10.5)

## 2021-05-20 LAB — LIPID PANEL
Cholesterol: 178 mg/dL (ref 0–200)
HDL: 69.4 mg/dL (ref >39.00)
LDL Cholesterol: 87 mg/dL (ref 0–99)
NonHDL: 108.85
Total CHOL/HDL Ratio: 3
Triglycerides: 111 mg/dL (ref 0.0–149.0)
VLDL: 22.2 mg/dL (ref 0.0–40.0)

## 2021-05-20 LAB — TSH: TSH: 1.42 u[IU]/mL (ref 0.35–5.50)

## 2021-05-20 MED ORDER — NYSTATIN 100000 UNIT/ML MT SUSP: 5.0000 mL | Freq: Four times a day (QID) | OROMUCOSAL | 0 refills | Status: DC

## 2021-05-20 MED ORDER — SUCRALFATE 1 G PO TABS: 1.0000 g | ORAL_TABLET | Freq: Three times a day (TID) | ORAL | 0 refills | Status: DC

## 2021-05-20 MED ORDER — OMEPRAZOLE 20 MG PO CPDR: 20.0000 mg | DELAYED_RELEASE_CAPSULE | Freq: Two times a day (BID) | ORAL | 3 refills | Status: DC

## 2021-05-20 NOTE — Patient Instructions (Addendum)
Ms. Kamauria Weisinger -   Great to meet you  Labs should be back later today. I'll call with any concerns.  For thrush: double omeprazole to twice daily for 1-2 weeks. Sucralfate taken 4 times daily: before meals and before bed. Nystatin rinse 4 times daily. Seems like a lot but I don't want to mess around since you've been dealing with this for too long.  Call me if no improvement in 7-10 days.  Thank you  Rich     If you have lab work done today you will be contacted with your lab results within the next 2 weeks.  If you have not heard from [place] then please contact us. The fastest way to get your results is to register for My Chart.   IF you received an x-ray today, you will receive an invoice from [facility]. Please contact [facility] at [phone] with questions or concerns regarding your invoice.   IF you received labwork today, you will receive an invoice from [facility]. Please contact [facility] at [phone] with questions or concerns regarding your invoice.   Our billing staff will not be able to assist you with questions regarding bills from these companies.  You will be contacted with the lab results as soon as they are available. The fastest way to get your results is to activate your My Chart account. Instructions are located on the last page of this paperwork. If you have not heard from [place] regarding the results in 2 weeks, please contact this office.

## 2021-05-26 DIAGNOSIS — M25611 Stiffness of right shoulder, not elsewhere classified: Secondary | ICD-10-CM | POA: Diagnosis not present

## 2021-05-26 DIAGNOSIS — M25511 Pain in right shoulder: Secondary | ICD-10-CM | POA: Diagnosis not present

## 2021-05-26 DIAGNOSIS — Z96611 Presence of right artificial shoulder joint: Secondary | ICD-10-CM | POA: Diagnosis not present

## 2021-05-26 DIAGNOSIS — M6281 Muscle weakness (generalized): Secondary | ICD-10-CM | POA: Diagnosis not present

## 2021-05-28 ENCOUNTER — Other Ambulatory Visit: Payer: Self-pay | Admitting: Family Medicine

## 2021-05-28 DIAGNOSIS — Z96611 Presence of right artificial shoulder joint: Secondary | ICD-10-CM | POA: Diagnosis not present

## 2021-05-28 DIAGNOSIS — M6281 Muscle weakness (generalized): Secondary | ICD-10-CM | POA: Diagnosis not present

## 2021-05-28 DIAGNOSIS — E038 Other specified hypothyroidism: Secondary | ICD-10-CM

## 2021-05-28 DIAGNOSIS — M25611 Stiffness of right shoulder, not elsewhere classified: Secondary | ICD-10-CM | POA: Diagnosis not present

## 2021-05-28 DIAGNOSIS — M25511 Pain in right shoulder: Secondary | ICD-10-CM | POA: Diagnosis not present

## 2021-05-31 IMAGING — CT CT HEAD W/O CM
2 series · 16 of 40 positions shown, 20 images · non-contrast
Comparison: None.

CLINICAL DATA: Decreased field of vision right eye. History of
glaucoma.

EXAM:
CT HEAD WITHOUT CONTRAST
TECHNIQUE: Contiguous axial images were obtained from the base of the skull
through the vertex without intravenous contrast.

[Series 2: head w/(date) · axial · 0.49mm/px · z∈[-135,+5]mm · 13 of 34 slices shown, 17 images]
[im 3/34  brain]
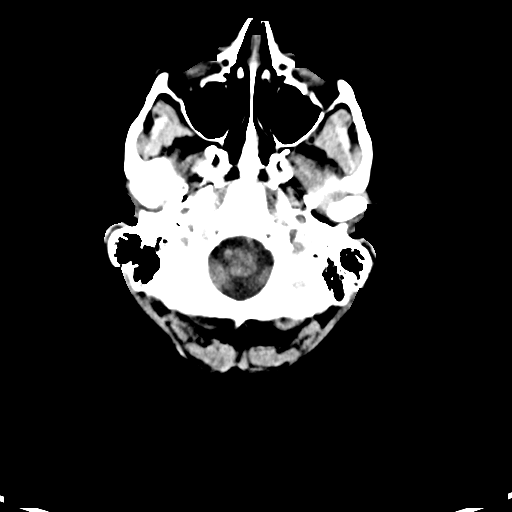
[im 3/34  bone]
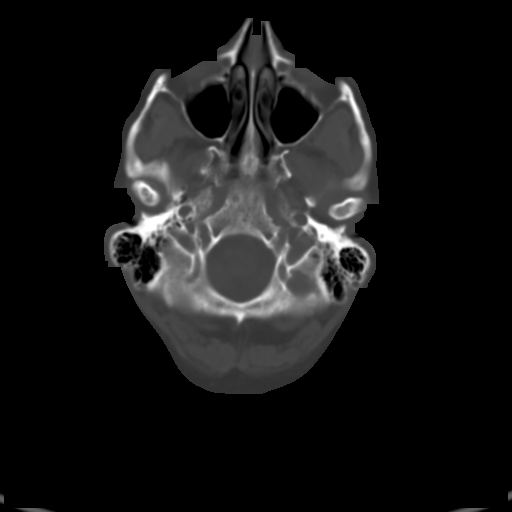
[im 5/34  brain]
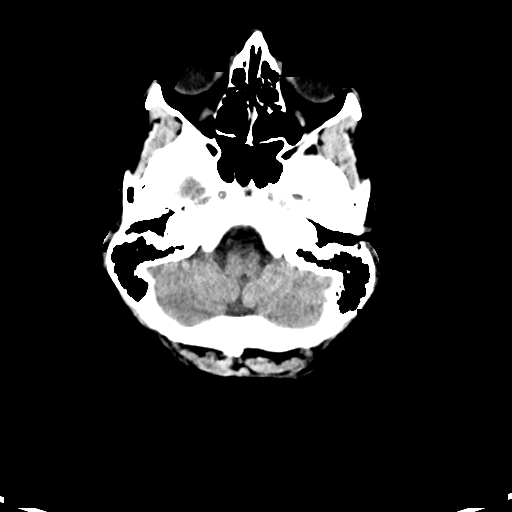
[im 7/34  brain]
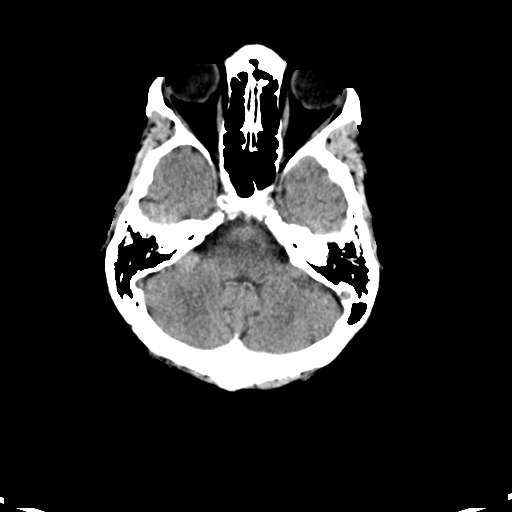
[im 10/34  brain]
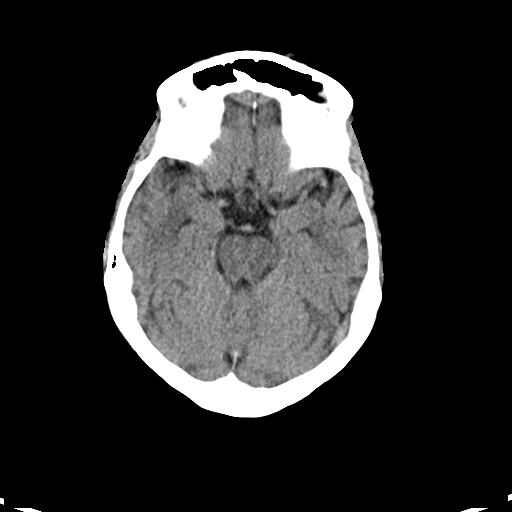
[im 12/34  brain]
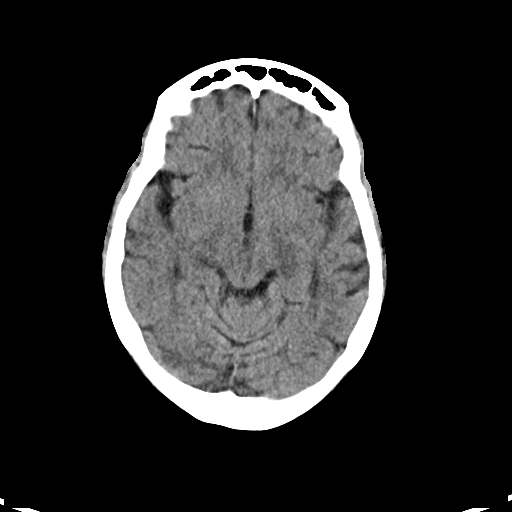
[im 12/34  bone]
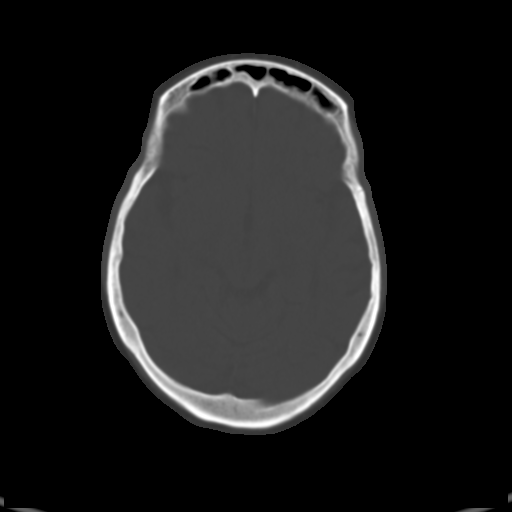
[im 14/34  brain]
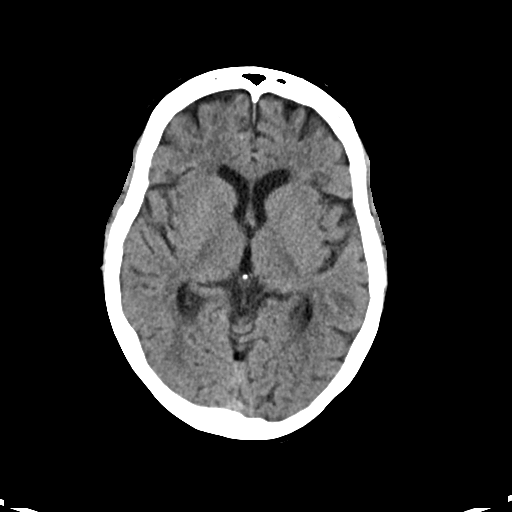
[im 18/34  brain]
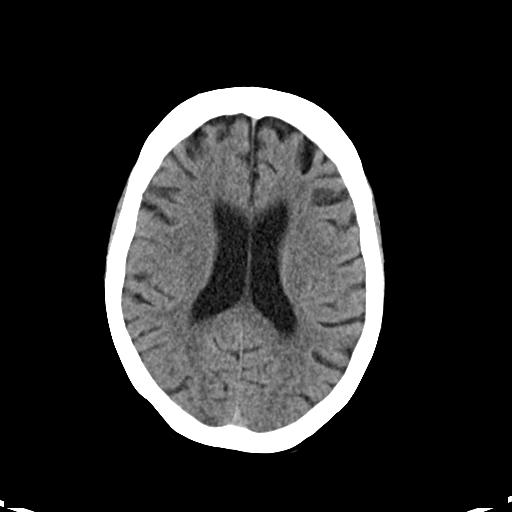
[im 20/34  brain]
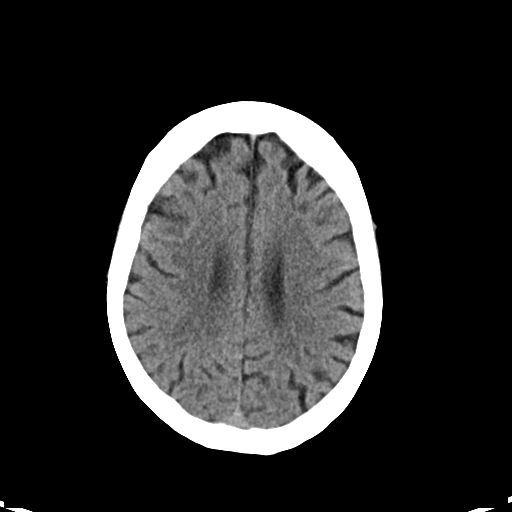
[im 22/34  brain]
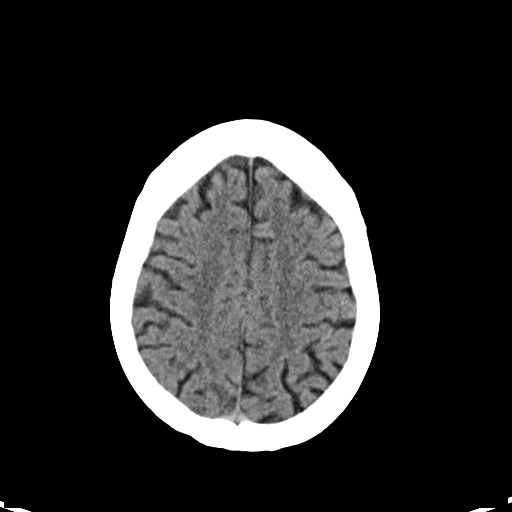
[im 22/34  bone]
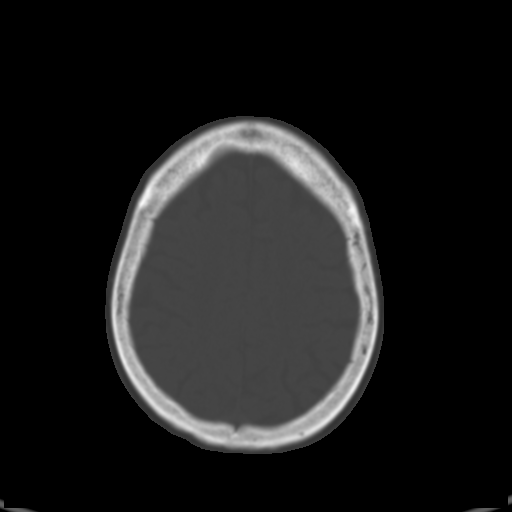
[im 24/34  brain]
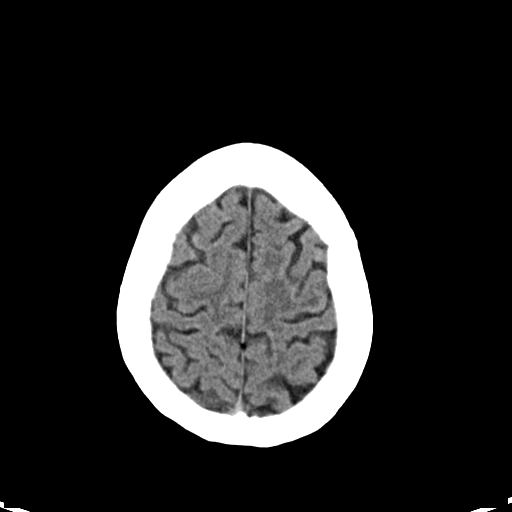
[im 27/34  brain]
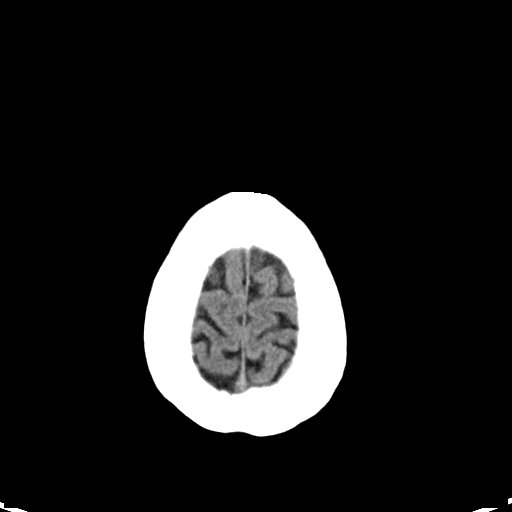
[im 29/34  brain]
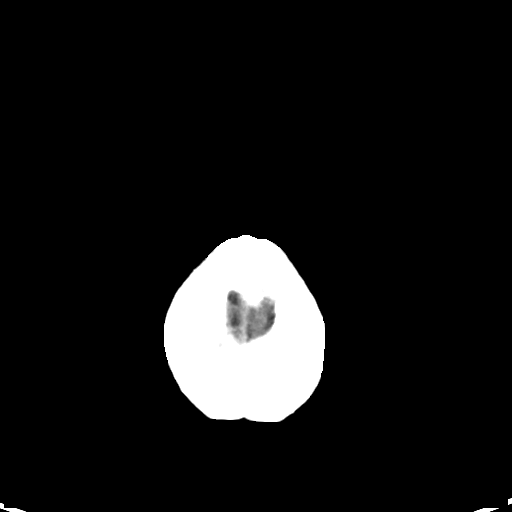
[im 31/34  brain]
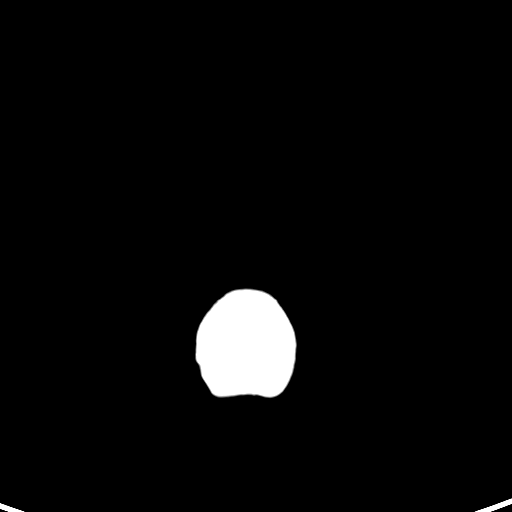
[im 31/34  bone]
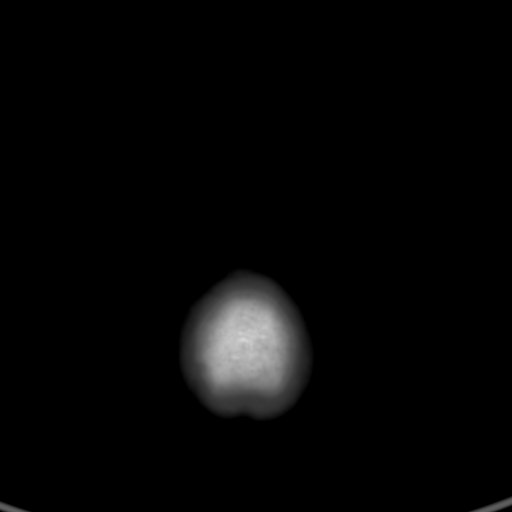

[Series 4: cor · coronal · 0.33mm/px · 3 of 72 slices shown]
[im 25/72  brain]
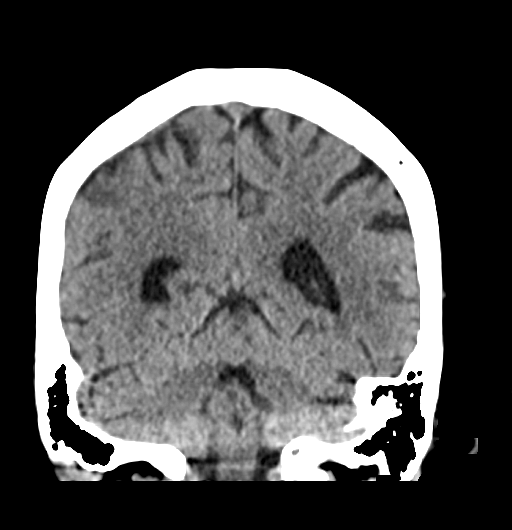
[im 32/72  brain]
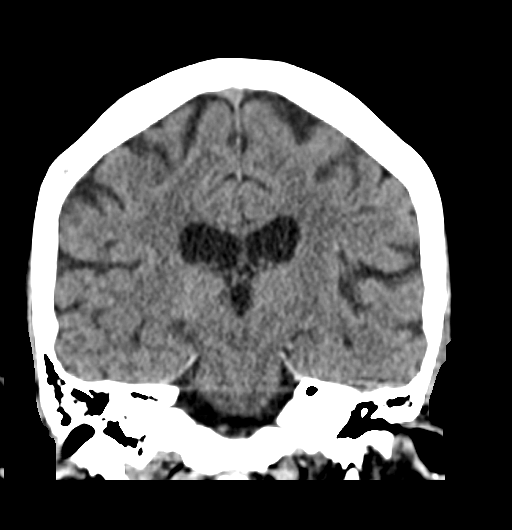
[im 40/72  brain]
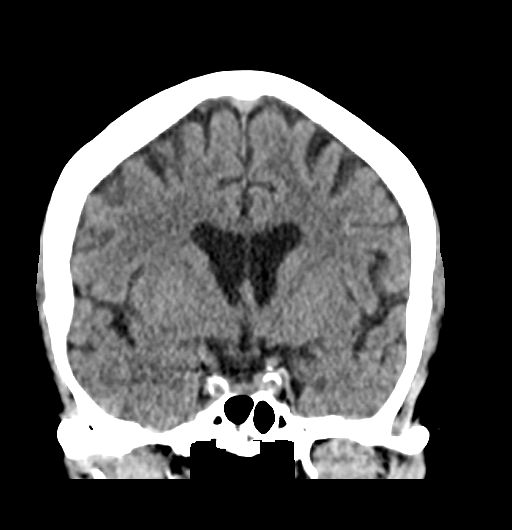

[16 of 40 positions shown; findings below may reference images not displayed]

FINDINGS: Brain: No evidence of acute infarction, hemorrhage, hydrocephalus,
extra-axial collection or mass lesion/mass effect. Mild cortical
atrophy.

Vascular: No hyperdense vessel or unexpected calcification.

Skull: Normal. Negative for fracture or focal lesion.

Sinuses/Orbits: Negative.

Other: None.
IMPRESSION: No acute abnormality.

Mild atrophy.

## 2021-06-09 DIAGNOSIS — M25511 Pain in right shoulder: Secondary | ICD-10-CM | POA: Diagnosis not present

## 2021-06-09 DIAGNOSIS — Z96611 Presence of right artificial shoulder joint: Secondary | ICD-10-CM | POA: Diagnosis not present

## 2021-06-09 DIAGNOSIS — M6281 Muscle weakness (generalized): Secondary | ICD-10-CM | POA: Diagnosis not present

## 2021-06-09 DIAGNOSIS — M25611 Stiffness of right shoulder, not elsewhere classified: Secondary | ICD-10-CM | POA: Diagnosis not present

## 2021-06-11 DIAGNOSIS — M25611 Stiffness of right shoulder, not elsewhere classified: Secondary | ICD-10-CM | POA: Diagnosis not present

## 2021-06-11 DIAGNOSIS — Z96611 Presence of right artificial shoulder joint: Secondary | ICD-10-CM | POA: Diagnosis not present

## 2021-06-11 DIAGNOSIS — M6281 Muscle weakness (generalized): Secondary | ICD-10-CM | POA: Diagnosis not present

## 2021-06-11 DIAGNOSIS — M25511 Pain in right shoulder: Secondary | ICD-10-CM | POA: Diagnosis not present

## 2021-06-20 ENCOUNTER — Other Ambulatory Visit: Payer: Self-pay | Admitting: Family Medicine

## 2021-06-20 DIAGNOSIS — E038 Other specified hypothyroidism: Secondary | ICD-10-CM

## 2021-06-24 DIAGNOSIS — M25511 Pain in right shoulder: Secondary | ICD-10-CM | POA: Diagnosis not present

## 2021-06-24 DIAGNOSIS — M6281 Muscle weakness (generalized): Secondary | ICD-10-CM | POA: Diagnosis not present

## 2021-06-24 DIAGNOSIS — Z96611 Presence of right artificial shoulder joint: Secondary | ICD-10-CM | POA: Diagnosis not present

## 2021-06-24 DIAGNOSIS — M25611 Stiffness of right shoulder, not elsewhere classified: Secondary | ICD-10-CM | POA: Diagnosis not present

## 2021-06-28 NOTE — Progress Notes (Signed)
Established Patient Office Visit  Subjective:  Patient ID: [name] [name], female    DOB: 1951-02-24  Age: 70 y.o. MRN: [id number]  CC:  Chief Complaint  Patient presents with   Follow-up    Patient is here for 6 month follow up. Patient would like to discuss sore inside of her mouth after seeing 3 diferrent doctors.    HPI [name] presents for 6 mo follow up  Hld: Diet and exercise controlled Lab Results  Component Value Date   CHOL 178 05/20/2021   HDL 69.40 05/20/2021   LDLCALC 87 05/20/2021   TRIG 111.0 05/20/2021   CHOLHDL 3 05/20/2021   Hypothyroidism: Taking NP thyroid 60mg  po qd Good effect, noAE Wants to continue.  Acute concern: Ulcer in mouth Persistent for a number of weeks Has used peridex rinse, mycelex, clotirmazole with limited effect No distinct ulcerations or draining lesions. No dysphagia No nvd Hx of GERD, but well controlled with PPI Past Medical History:  Diagnosis Date   Allergy, unspecified not elsewhere classified    Borderline hypertension    Bronchitis    Cataract    DJD (degenerative joint disease)    GERD (gastroesophageal reflux disease)    Glaucoma    History of skin cancer    Hypercholesterolemia    Hypothyroidism    IBS (irritable bowel syndrome)    OSA (obstructive sleep apnea) 12/27/2017   no CPAP   Plantar fasciitis    PONV (postoperative nausea and vomiting)    Postoperative nausea and vomiting 09/02/2015   Presence of shoulder implant 01/21/2021   Primary localized osteoarthritis of left knee    Skin cancer (mel[place]oma) ([place])    basil cell    Vitamin D deficiency     Past Surgical History:  Procedure Laterality Date   ANAL RECTAL MANOMETRY N/A 11/16/2019   Procedure: ANO RECTAL MANOMETRY;  Surgeon: Mauri Pole, MD;  Location: [facility];  Service: Endoscopy;  Laterality: N/A;   BUNIONECTOMY  2004   left   CATARACT EXTRACTION W/ INTRAOCULAR LENS  IMPLANT, BILATERAL     COLONOSCOPY     FOOT  OSTEOTOMY  2003   right   HERNIA REPAIR  1957   JOINT REPLACEMENT     KNEE ARTHROSCOPY  1987&2003   left   MELANOMA EXCISION Right    shoulder   REVERSE SHOULDER ARTHROPLASTY Right 01/21/2021   Procedure: REVERSE SHOULDER ARTHROPLASTY;  Surgeon: Hiram Gash, MD;  Location: [place];  Service: Orthopedics;  Laterality: Right;   SHOULDER ARTHROSCOPY WITH OPEN ROTATOR CUFF REPAIR  2013   right   SKIN SURGERY  01/2004   Melanoma surg left shoulder   TOENAIL EXCISION  10/04/2017   TOTAL KNEE ARTHROPLASTY Left 09/01/2015   Procedure: TOTAL KNEE ARTHROPLASTY;  Surgeon: Elsie Saas, MD;  Location: [facility];  Service: Orthopedics;  Laterality: Left;   TUBAL LIGATION  1990    Family History  Problem Relation Age of Onset   Colon polyps Father    Cancer Father        laryngeal   Heart disease Father    Diabetes Son    Osteoporosis Sister    Hypertension Brother    Heart disease Maternal Grandfather    Heart disease Paternal Grandfather    Colon cancer Neg Hx    Stomach cancer Neg Hx     Social History   Socioeconomic History   Marital status: Married    Spouse name: Marya Amsler [phone]  Number of children: 2   Years of education: Not on file   Highest education level: Not on file  Occupational History   Occupation: retired  Tobacco Use   Smoking status: Former    Years: 6.00    Types: Cigarettes    Quit date: 10/05/1975    Years since quitting: 45.7   Smokeless tobacco: Never  Vaping Use   Vaping Use: Never used  Substance and Sexual Activity   Alcohol use: Yes    Alcohol/week: 14.0 standard drinks    Types: 12 Standard drinks or equivalent, 2 Glasses of wine per week    Comment: socially   Drug use: No   Sexual activity: Not on file  Other Topics Concern   Not on file  Social History Narrative   Not on file   Social Determinants of Health   Financial Resource Strain: Low Risk    Difficulty of Paying Living Expenses: Not hard at all  Food  Insecurity: No Food Insecurity   Worried About Charity fundraiser in the Last Year: Never true   [place] in the Last Year: Never true  Transportation Needs: No Transportation Needs   Lack of Transportation (Medical): No   Lack of Transportation (Non-Medical): No  Physical Activity: Insufficiently Active   Days of Exercise per Week: 7 days   Minutes of Exercise per Session: 20 min  Stress: No Stress Concern Present   Feeling of Stress : Not at all  Social Connections: Moderately Isolated   Frequency of Communication with Friends and Family: More than three times a week   Frequency of Social Gatherings with Friends and Family: More than three times a week   Attends Religious Services: Never   Marine scientist or Organizations: No   Attends Music therapist: Never   Marital Status: Married  Human resources officer Violence: Not At Risk   Fear of Current or Ex-Partner: No   Emotionally Abused: No   Physically Abused: No   Sexually Abused: No    Outpatient Medications Prior to Visit  Medication Sig Dispense Refill   aspirin 325 MG tablet Take 325 mg by mouth daily.     B Complex-C (SUPER B COMPLEX PO) Take 1 tablet by mouth daily.      brimonidine (ALPHAGAN) 0.2 % ophthalmic solution 1 drop 3 (three) times daily.     cetirizine (ZYRTEC) 10 MG tablet Take 10 mg by mouth daily.     Cholecalciferol (VITAMIN D3) 5000 UNITS CAPS Take 5,000 Units by mouth daily.      Coenzyme Q10 (COQ10) 200 MG CAPS Take 1 capsule by mouth daily.      doxycycline (ADOXA) 50 MG tablet Take 50 mg by mouth daily. For rosacea     Magnesium 200 MG TABS Take 200 mg by mouth in the morning and at bedtime.      Multiple Vitamins-Minerals (MULTIVITAMIN PO) Take 1 tablet by mouth daily.     simvastatin (ZOCOR) 20 MG tablet TAKE 1 TABLET (20 MG TOTAL) BY MOUTH AT BEDTIME. 90 tablet 1   SYSTANE BALANCE 0.6 % SOLN SMARTSIG:1 Drop(s) In Eye(s) PRN     Travoprost, BAK Free, (TRAVATAN) 0.004 % SOLN  ophthalmic solution Place 1 drop into both eyes at bedtime.      NP THYROID 60 MG tablet Take 1 tablet (60 mg total) by mouth daily before breakfast. 90 tablet 0   omeprazole (PRILOSEC) 20 MG capsule TAKE 1 CAPSULE (20 MG TOTAL)  BY MOUTH DAILY. 90 capsule 1   chlorhexidine (PERIDEX) 0.12 % solution Use as directed 15 mLs in the mouth or throat 2 (two) times daily. 473 mL 0   clotrimazole (MYCELEX) 10 MG troche Take 1 tablet (10 mg total) by mouth 5 (five) times daily. 70 Troche 0   No facility-administered medications prior to visit.    Allergies  Allergen Reactions   Clindamycin/Lincomycin Diarrhea   Flagyl [Metronidazole Hcl] Diarrhea    Stomach upset. Pt states she CANNOT TAKE oral, has no problem with topical.    Augmentin [Amoxicillin-Pot Clavulanate] Diarrhea   Telithromycin Diarrhea    ROS Review of Systems Per hpi     Objective:    Physical Exam Vitals and nursing note reviewed.  Constitutional:      General: She is not in acute distress.    Appearance: Normal appearance. She is normal weight. She is not ill-appearing, toxic-appearing or diaphoretic.  HENT:     Mouth/Throat:     Mouth: Mucous membranes are moist.     Tongue: Lesions (whitish patching on tongue) present.  Cardiovascular:     Rate and Rhythm: Normal rate and regular rhythm.     Heart sounds: Normal heart sounds. No murmur heard.   No friction rub. No gallop.  Pulmonary:     Effort: Pulmonary effort is normal. No respiratory distress.     Breath sounds: Normal breath sounds. No stridor. No wheezing, rhonchi or rales.  Chest:     Chest wall: No tenderness.  Skin:    General: Skin is warm and dry.  Neurological:     General: No focal deficit present.     Mental Status: She is alert and oriented to person, place, and time. Mental status is at baseline.  Psychiatric:        Mood and Affect: Mood normal.        Behavior: Behavior normal.        Thought Content: Thought content normal.         Judgment: Judgment normal.    BP 132/74   Pulse 69   Temp 98 F (36.7 C) (Temporal)   Resp 18   Ht 5\' 6"  (1.676 m)   Wt 193 lb 6.4 oz (87.7 kg)   SpO2 99%   BMI 31.22 kg/m  Wt Readings from Last 3 Encounters:  05/20/21 193 lb 6.4 oz (87.7 kg)  04/22/21 192 lb (87.1 kg)  04/20/21 186 lb (84.4 kg)     Health Maintenance Due  Topic Date Due   INFLUENZA VACCINE  05/04/2021   COVID-19 Vaccine (5 - Booster for Pfizer series) 06/14/2021    There are no preventive care reminders to display for this patient.  Lab Results  Component Value Date   TSH 1.42 05/20/2021   Lab Results  Component Value Date   WBC 5.9 05/20/2021   HGB 13.3 05/20/2021   HCT 41.1 05/20/2021   MCV 80.6 05/20/2021   PLT 241.0 05/20/2021   Lab Results  Component Value Date   NA 139 05/20/2021   K 4.3 05/20/2021   CO2 28 05/20/2021   GLUCOSE 99 05/20/2021   BUN 17 05/20/2021   CREATININE 0.78 05/20/2021   BILITOT 0.7 05/20/2021   ALKPHOS 97 05/20/2021   AST 23 05/20/2021   ALT 22 05/20/2021   PROT 7.3 05/20/2021   ALBUMIN 4.4 05/20/2021   CALCIUM 9.7 05/20/2021   ANIONGAP 9 01/05/2017   GFR 77.47 05/20/2021   Lab Results  Component Value Date  CHOL 178 05/20/2021   Lab Results  Component Value Date   HDL 69.40 05/20/2021   Lab Results  Component Value Date   LDLCALC 87 05/20/2021   Lab Results  Component Value Date   TRIG 111.0 05/20/2021   Lab Results  Component Value Date   CHOLHDL 3 05/20/2021   Lab Results  Component Value Date   HGBA1C 5.5 03/18/2015      Assessment & Plan:   Problem List Items Addressed This Visit       Digestive   GERD   Relevant Medications   omeprazole (PRILOSEC) 20 MG capsule   sucralfate (CARAFATE) 1 g tablet     Other   Hyperlipidemia   Relevant Orders   CBC with Differential/Platelet (Completed)   Comprehensive metabolic panel (Completed)   Lipid panel (Completed)   TSH (Completed)   Obesity (BMI 30-39.9)   Relevant Orders    CBC with Differential/Platelet (Completed)   Comprehensive metabolic panel (Completed)   Lipid panel (Completed)   TSH (Completed)   Other Visit Diagnoses     Thrush    -  Primary   Relevant Medications   nystatin (MYCOSTATIN) 100000 UNIT/ML suspension   Coronary artery calcification       Relevant Orders   CBC with Differential/Platelet (Completed)   Comprehensive metabolic panel (Completed)   Lipid panel (Completed)   TSH (Completed)   Need for diphtheria-tetanus-pertussis (Tdap) vaccine       Pneumococcal vaccination administered at current visit       Relevant Orders   Pneumococcal conjugate vaccine 20-valent (Prevnar 20) (Completed)   Need for Tdap vaccination       Relevant Orders   Tdap vaccine greater than or equal to 7yo IM (Completed)       Meds ordered this encounter  Medications   omeprazole (PRILOSEC) 20 MG capsule    Sig: Take 1 capsule (20 mg total) by mouth 2 (two) times daily before a meal.    Dispense:  180 capsule    Refill:  3    Order Specific Question:   Supervising Provider    Answer:   Carlota Raspberry, JEFFREY R [[id number]]   sucralfate (CARAFATE) 1 g tablet    Sig: Take 1 tablet (1 g total) by mouth 4 (four) times daily -  with meals and at bedtime.    Dispense:  40 tablet    Refill:  0    Order Specific Question:   Supervising Provider    Answer:   Carlota Raspberry, JEFFREY R [[id number]]   nystatin (MYCOSTATIN) 100000 UNIT/ML suspension    Sig: Take 5 mLs (500,000 Units total) by mouth 4 (four) times daily.    Dispense:  60 mL    Refill:  0    Order Specific Question:   Supervising Provider    Answer:   Carlota Raspberry, JEFFREY R [[id number]]    Follow-up: Return in about 6 months (around 11/20/2021) for chronic conditions.   PLAN Thrush vs. Benign inflammatory glossitis. Will try nystatin rinse and sucralfate to get head of any silent GERD Tdap booster given Labs collected. Will follow up with the patient as warranted. Follow up in 6 mo with PCP Patient encouraged to call  clinic with any questions, comments, or concerns.  Maximiano Coss, NP

## 2021-06-29 DIAGNOSIS — M25611 Stiffness of right shoulder, not elsewhere classified: Secondary | ICD-10-CM | POA: Diagnosis not present

## 2021-06-29 DIAGNOSIS — M25511 Pain in right shoulder: Secondary | ICD-10-CM | POA: Diagnosis not present

## 2021-06-29 DIAGNOSIS — Z96611 Presence of right artificial shoulder joint: Secondary | ICD-10-CM | POA: Diagnosis not present

## 2021-06-29 DIAGNOSIS — M6281 Muscle weakness (generalized): Secondary | ICD-10-CM | POA: Diagnosis not present

## 2021-06-30 ENCOUNTER — Encounter: Payer: Self-pay | Admitting: Family Medicine

## 2021-07-01 DIAGNOSIS — L720 Epidermal cyst: Secondary | ICD-10-CM | POA: Diagnosis not present

## 2021-07-01 DIAGNOSIS — D235 Other benign neoplasm of skin of trunk: Secondary | ICD-10-CM | POA: Diagnosis not present

## 2021-07-15 DIAGNOSIS — L821 Other seborrheic keratosis: Secondary | ICD-10-CM | POA: Diagnosis not present

## 2021-07-15 DIAGNOSIS — L820 Inflamed seborrheic keratosis: Secondary | ICD-10-CM | POA: Diagnosis not present

## 2021-07-15 DIAGNOSIS — Z86008 Personal history of in-situ neoplasm of other site: Secondary | ICD-10-CM | POA: Diagnosis not present

## 2021-07-15 DIAGNOSIS — L570 Actinic keratosis: Secondary | ICD-10-CM | POA: Diagnosis not present

## 2021-07-15 DIAGNOSIS — B009 Herpesviral infection, unspecified: Secondary | ICD-10-CM | POA: Diagnosis not present

## 2021-07-15 DIAGNOSIS — L720 Epidermal cyst: Secondary | ICD-10-CM | POA: Diagnosis not present

## 2021-07-15 DIAGNOSIS — Z85820 Personal history of malignant melanoma of skin: Secondary | ICD-10-CM | POA: Diagnosis not present

## 2021-07-16 DIAGNOSIS — Z961 Presence of intraocular lens: Secondary | ICD-10-CM | POA: Diagnosis not present

## 2021-07-17 DIAGNOSIS — M6281 Muscle weakness (generalized): Secondary | ICD-10-CM | POA: Diagnosis not present

## 2021-07-17 DIAGNOSIS — Z96611 Presence of right artificial shoulder joint: Secondary | ICD-10-CM | POA: Diagnosis not present

## 2021-07-17 DIAGNOSIS — M25611 Stiffness of right shoulder, not elsewhere classified: Secondary | ICD-10-CM | POA: Diagnosis not present

## 2021-07-17 DIAGNOSIS — M25511 Pain in right shoulder: Secondary | ICD-10-CM | POA: Diagnosis not present

## 2021-07-30 DIAGNOSIS — Z96611 Presence of right artificial shoulder joint: Secondary | ICD-10-CM | POA: Diagnosis not present

## 2021-07-30 DIAGNOSIS — M25511 Pain in right shoulder: Secondary | ICD-10-CM | POA: Diagnosis not present

## 2021-07-30 DIAGNOSIS — M25611 Stiffness of right shoulder, not elsewhere classified: Secondary | ICD-10-CM | POA: Diagnosis not present

## 2021-07-30 DIAGNOSIS — M6281 Muscle weakness (generalized): Secondary | ICD-10-CM | POA: Diagnosis not present

## 2021-08-05 DIAGNOSIS — Z880 Allergy status to penicillin: Secondary | ICD-10-CM | POA: Diagnosis not present

## 2021-08-05 DIAGNOSIS — Z85828 Personal history of other malignant neoplasm of skin: Secondary | ICD-10-CM | POA: Diagnosis not present

## 2021-08-05 DIAGNOSIS — R918 Other nonspecific abnormal finding of lung field: Secondary | ICD-10-CM | POA: Diagnosis not present

## 2021-08-05 DIAGNOSIS — C434 Malignant melanoma of scalp and neck: Secondary | ICD-10-CM | POA: Diagnosis not present

## 2021-08-05 DIAGNOSIS — Z08 Encounter for follow-up examination after completed treatment for malignant neoplasm: Secondary | ICD-10-CM | POA: Diagnosis not present

## 2021-08-05 DIAGNOSIS — Z87891 Personal history of nicotine dependence: Secondary | ICD-10-CM | POA: Diagnosis not present

## 2021-08-05 DIAGNOSIS — Z881 Allergy status to other antibiotic agents status: Secondary | ICD-10-CM | POA: Diagnosis not present

## 2021-08-05 DIAGNOSIS — Z886 Allergy status to analgesic agent status: Secondary | ICD-10-CM | POA: Diagnosis not present

## 2021-08-14 ENCOUNTER — Other Ambulatory Visit: Payer: Self-pay | Admitting: Family Medicine

## 2021-08-14 DIAGNOSIS — E038 Other specified hypothyroidism: Secondary | ICD-10-CM

## 2021-09-07 ENCOUNTER — Other Ambulatory Visit: Payer: Self-pay | Admitting: Family Medicine

## 2021-09-07 MED ORDER — LEVOTHYROXINE SODIUM 100 MCG PO TABS: 100.0000 ug | ORAL_TABLET | Freq: Every day | ORAL | 1 refills | Status: DC

## 2021-09-07 NOTE — Progress Notes (Signed)
Prescription was changed due to formulary benefits

## 2021-10-18 ENCOUNTER — Other Ambulatory Visit: Payer: Self-pay | Admitting: Family Medicine

## 2021-10-27 DIAGNOSIS — H401132 Primary open-angle glaucoma, bilateral, moderate stage: Secondary | ICD-10-CM | POA: Diagnosis not present

## 2021-10-27 DIAGNOSIS — H43813 Vitreous degeneration, bilateral: Secondary | ICD-10-CM | POA: Diagnosis not present

## 2021-10-27 DIAGNOSIS — Z961 Presence of intraocular lens: Secondary | ICD-10-CM | POA: Diagnosis not present

## 2021-10-27 DIAGNOSIS — H18413 Arcus senilis, bilateral: Secondary | ICD-10-CM | POA: Diagnosis not present

## 2021-10-28 ENCOUNTER — Telehealth (INDEPENDENT_AMBULATORY_CARE_PROVIDER_SITE_OTHER): Payer: Medicare HMO | Admitting: Registered Nurse

## 2021-10-28 ENCOUNTER — Other Ambulatory Visit: Payer: Self-pay

## 2021-10-28 ENCOUNTER — Encounter: Payer: Self-pay | Admitting: Registered Nurse

## 2021-10-28 VITALS — BP 125/76 | HR 81 | Wt 186.0 lb

## 2021-10-28 DIAGNOSIS — J019 Acute sinusitis, unspecified: Secondary | ICD-10-CM

## 2021-10-28 DIAGNOSIS — B9689 Other specified bacterial agents as the cause of diseases classified elsewhere: Secondary | ICD-10-CM

## 2021-10-28 MED ORDER — AZITHROMYCIN 250 MG PO TABS: ORAL_TABLET | ORAL | 0 refills | Status: AC

## 2021-10-28 MED ORDER — AZELASTINE HCL 0.1 % NA SOLN: 1.0000 | Freq: Two times a day (BID) | NASAL | 12 refills | Status: DC

## 2021-10-28 NOTE — Patient Instructions (Signed)
° ° ° °  If you have lab work done today you will be contacted with your lab results within the next 2 weeks.  If you have not heard from us then please contact us. The fastest way to get your results is to register for My Chart. ° ° °IF you received an x-ray today, you will receive an invoice from [facility]. Please contact [facility] at [phone] with questions or concerns regarding your invoice.  ° °IF you received labwork today, you will receive an invoice from [facility]. Please contact [facility] at [phone] with questions or concerns regarding your invoice.  ° °Our billing staff will not be able to assist you with questions regarding bills from these companies. ° °You will be contacted with the lab results as soon as they are available. The fastest way to get your results is to activate your My Chart account. Instructions are located on the last page of this paperwork. If you have not heard from us regarding the results in 2 weeks, please contact this office. °  ° ° ° °

## 2021-10-28 NOTE — Progress Notes (Signed)
Telemedicine Encounter- SOAP NOTE Established Patient  This telephone encounter was conducted with the patient's (or proxy's) verbal consent via audio telecommunications: yes/no: Yes Patient was instructed to have this encounter in a suitably private space; and to only have persons present to whom they give permission to participate. In addition, patient identity was confirmed by use of name plus two identifiers (DOB and address).  I discussed the limitations, risks, security and privacy concerns of performing an evaluation and management service by telephone and the availability of in person appointments. I also discussed with the patient that there may be a patient responsible charge related to this service. The patient expressed understanding and agreed to proceed.  I spent a total of 14 minutes talking with the patient or their proxy.  Patient at home Provider in office  Participants: Kathrin Ruddy, NP and [name]  Chief Complaint  Patient presents with   Nasal Congestion    Patient states she has been experiencing some nasal congestion and runny nose for the last couple weeks. Patient states her husband was having the same symptoms and both tested negative for covid.    Subjective   [name] [name] is a 71 y.o. established patient. Telephone visit today for nasal congestion  HPI Onset 2-3 weeks. Congestion, rhinorrhea. Some sinus pressure ongoing. No ear pressure. No sore throat. Scant coughing.  Denies cough, shob, doe, chest congestion. Denies nvd, fevers, headaches.  Fluticasone nasal spray daily x 1 week.  Notes upcoming travel, would like to improve prior to this. Celebrating her 71th in [place]  Patient Active Problem List   Diagnosis Date Noted   Precordial pain 04/09/2021   DOE (dyspnea on exertion) 04/09/2021   Coronary artery calcification seen on CAT scan 04/09/2021   Mixed hyperlipidemia 04/09/2021   Cataract 03/16/2021   Glaucoma 03/16/2021    Plantar fasciitis 03/16/2021   Skin cancer (melanoma) ([place]) 03/16/2021   Tremor of both hands 05/21/2020   Incontinence of feces    Bloating 08/22/2019   Fecal soiling 08/22/2019   Obesity (BMI 30-39.9) 11/15/2018   Anxiety about health 05/17/2018   OSA (obstructive sleep apnea) 12/27/2017   Hx of adenomatous colonic polyps 01/17/2017   Postoperative nausea and vomiting 09/02/2015   Primary localized osteoarthritis of left knee    IBS (irritable bowel syndrome)    Physical exam 05/30/2015   Hypothyroidism 11/22/2014   Vitamin D deficiency 09/21/2009   Hyperlipidemia 09/21/2009   SKIN CANCER, HX OF 09/15/2009   Anxiety state 12/19/2007   GERD 12/19/2007   ALLERGY 12/19/2007    Past Medical History:  Diagnosis Date   Allergy, unspecified not elsewhere classified    Borderline hypertension    Bronchitis    Cataract    DJD (degenerative joint disease)    GERD (gastroesophageal reflux disease)    Glaucoma    History of skin cancer    Hypercholesterolemia    Hypothyroidism    IBS (irritable bowel syndrome)    OSA (obstructive sleep apnea) 12/27/2017   no CPAP   Plantar fasciitis    PONV (postoperative nausea and vomiting)    Postoperative nausea and vomiting 09/02/2015   Presence of shoulder implant 01/21/2021   Primary localized osteoarthritis of left knee    Skin cancer (melanoma) ([facility])    basil cell    Vitamin D deficiency     Current Outpatient Medications  Medication Sig Dispense Refill   aspirin 325 MG tablet Take 325 mg by mouth daily.  azelastine (ASTELIN) 0.1 % nasal spray Place 1 spray into both nostrils 2 (two) times daily. Use in each nostril as directed 30 mL 12   azithromycin (ZITHROMAX) 250 MG tablet Take 2 tablets on day 1, then 1 tablet daily on days 2 through 5 6 tablet 0   B Complex-C (SUPER B COMPLEX PO) Take 1 tablet by mouth daily.      brimonidine (ALPHAGAN) 0.2 % ophthalmic solution 1 drop 3 (three) times daily.     cetirizine (ZYRTEC) 10 MG  tablet Take 10 mg by mouth daily.     Cholecalciferol (VITAMIN D3) 5000 UNITS CAPS Take 5,000 Units by mouth daily.      Coenzyme Q10 (COQ10) 200 MG CAPS Take 1 capsule by mouth daily.      doxycycline (ADOXA) 50 MG tablet Take 50 mg by mouth daily. For rosacea     levothyroxine (EUTHYROX) 100 MCG tablet Take 1 tablet (100 mcg total) by mouth daily before breakfast. 90 tablet 1   Magnesium 200 MG TABS Take 200 mg by mouth in the morning and at bedtime.      Multiple Vitamins-Minerals (MULTIVITAMIN PO) Take 1 tablet by mouth daily.     nystatin (MYCOSTATIN) 100000 UNIT/ML suspension Take 5 mLs (500,000 Units total) by mouth 4 (four) times daily. 60 mL 0   omeprazole (PRILOSEC) 20 MG capsule Take 1 capsule (20 mg total) by mouth 2 (two) times daily before a meal. 180 capsule 3   simvastatin (ZOCOR) 20 MG tablet TAKE 1 TABLET (20 MG TOTAL) BY MOUTH AT BEDTIME. 90 tablet 1   sucralfate (CARAFATE) 1 g tablet Take 1 tablet (1 g total) by mouth 4 (four) times daily -  with meals and at bedtime. 40 tablet 0   SYSTANE BALANCE 0.6 % SOLN SMARTSIG:1 Drop(s) In Eye(s) PRN     Travoprost, BAK Free, (TRAVATAN) 0.004 % SOLN ophthalmic solution Place 1 drop into both eyes at bedtime.      No current facility-administered medications for this visit.    Allergies  Allergen Reactions   Clindamycin/Lincomycin Diarrhea   Flagyl [Metronidazole Hcl] Diarrhea    Stomach upset. Pt states she CANNOT TAKE oral, has no problem with topical.    Augmentin [Amoxicillin-Pot Clavulanate] Diarrhea   Telithromycin Diarrhea    Social History   Socioeconomic History   Marital status: Married    Spouse name: Marya Amsler [phone]   Number of children: 2   Years of education: Not on file   Highest education level: Not on file  Occupational History   Occupation: retired  Tobacco Use   Smoking status: Former    Years: 6.00    Types: Cigarettes    Quit date: 10/05/1975    Years since quitting: 46.1   Smokeless tobacco:  Never  Vaping Use   Vaping Use: Never used  Substance and Sexual Activity   Alcohol use: Yes    Alcohol/week: 14.0 standard drinks    Types: 12 Standard drinks or equivalent, 2 Glasses of wine per week    Comment: socially   Drug use: No   Sexual activity: Not on file  Other Topics Concern   Not on file  Social History Narrative   Not on file   Social Determinants of Health   Financial Resource Strain: Low Risk    Difficulty of Paying Living Expenses: Not hard at all  Food Insecurity: No Food Insecurity   Worried About [place] in the Last Year: Never true  Ran Out of Food in the Last Year: Never true  Transportation Needs: No Transportation Needs   Lack of Transportation (Medical): No   Lack of Transportation (Non-Medical): No  Physical Activity: Insufficiently Active   Days of Exercise per Week: 7 days   Minutes of Exercise per Session: 20 min  Stress: No Stress Concern Present   Feeling of Stress : Not at all  Social Connections: Moderately Isolated   Frequency of Communication with Friends and Family: More than three times a week   Frequency of Social Gatherings with Friends and Family: More than three times a week   Attends Religious Services: Never   Marine scientist or Organizations: No   Attends Music therapist: Never   Marital Status: Married  Human resources officer Violence: Not At Risk   Fear of Current or Ex-Partner: No   Emotionally Abused: No   Physically Abused: No   Sexually Abused: No    ROS  Objective   Vitals as reported by the patient: Today's Vitals   10/28/21 1254  BP: 125/76  Pulse: 81  SpO2: 97%  Weight: 186 lb (84.4 kg)    Espyn was seen today for nasal congestion.  Diagnoses and all orders for this visit:  Acute bacterial sinusitis -     azithromycin (ZITHROMAX) 250 MG tablet; Take 2 tablets on day 1, then 1 tablet daily on days 2 through 5 -     azelastine (ASTELIN) 0.1 % nasal spray; Place 1 spray  into both nostrils 2 (two) times daily. Use in each nostril as directed   PLAN Concern for bacterial sinusitis. Z pack and azelastine as above Return if worsening or failing to improve Continue flonase, add tylenol. Rest, hydrate Patient encouraged to call clinic with any questions, comments, or concerns.   I discussed the assessment and treatment plan with the patient. The patient was provided an opportunity to ask questions and all were answered. The patient agreed with the plan and demonstrated an understanding of the instructions.   The patient was advised to call back or seek an in-person evaluation if the symptoms worsen or if the condition fails to improve as anticipated.  I provided 14 minutes of non-face-to-face time during this encounter.  Maximiano Coss, NP

## 2021-11-18 DIAGNOSIS — L820 Inflamed seborrheic keratosis: Secondary | ICD-10-CM | POA: Diagnosis not present

## 2021-11-18 DIAGNOSIS — L821 Other seborrheic keratosis: Secondary | ICD-10-CM | POA: Diagnosis not present

## 2021-11-18 DIAGNOSIS — Z85820 Personal history of malignant melanoma of skin: Secondary | ICD-10-CM | POA: Diagnosis not present

## 2021-11-18 DIAGNOSIS — Z85828 Personal history of other malignant neoplasm of skin: Secondary | ICD-10-CM | POA: Diagnosis not present

## 2021-11-18 DIAGNOSIS — D485 Neoplasm of uncertain behavior of skin: Secondary | ICD-10-CM | POA: Diagnosis not present

## 2021-11-18 DIAGNOSIS — L578 Other skin changes due to chronic exposure to nonionizing radiation: Secondary | ICD-10-CM | POA: Diagnosis not present

## 2021-11-18 DIAGNOSIS — D225 Melanocytic nevi of trunk: Secondary | ICD-10-CM | POA: Diagnosis not present

## 2021-11-18 DIAGNOSIS — Z86008 Personal history of in-situ neoplasm of other site: Secondary | ICD-10-CM | POA: Diagnosis not present

## 2021-11-18 DIAGNOSIS — L910 Hypertrophic scar: Secondary | ICD-10-CM | POA: Diagnosis not present

## 2021-11-18 DIAGNOSIS — L570 Actinic keratosis: Secondary | ICD-10-CM | POA: Diagnosis not present

## 2021-11-20 ENCOUNTER — Ambulatory Visit (INDEPENDENT_AMBULATORY_CARE_PROVIDER_SITE_OTHER): Payer: Medicare HMO | Admitting: Family Medicine

## 2021-11-20 ENCOUNTER — Encounter: Payer: Self-pay | Admitting: Family Medicine

## 2021-11-20 VITALS — BP 114/72 | HR 70 | Temp 98.2°F | Resp 16 | Wt 192.2 lb

## 2021-11-20 DIAGNOSIS — E782 Mixed hyperlipidemia: Secondary | ICD-10-CM | POA: Diagnosis not present

## 2021-11-20 DIAGNOSIS — E038 Other specified hypothyroidism: Secondary | ICD-10-CM

## 2021-11-20 DIAGNOSIS — R4701 Aphasia: Secondary | ICD-10-CM

## 2021-11-20 DIAGNOSIS — E559 Vitamin D deficiency, unspecified: Secondary | ICD-10-CM | POA: Diagnosis not present

## 2021-11-20 DIAGNOSIS — R252 Cramp and spasm: Secondary | ICD-10-CM | POA: Diagnosis not present

## 2021-11-20 DIAGNOSIS — C434 Malignant melanoma of scalp and neck: Secondary | ICD-10-CM | POA: Insufficient documentation

## 2021-11-20 LAB — CBC WITH DIFFERENTIAL/PLATELET
Basophils Absolute: 0.1 10*3/uL (ref 0.0–0.1)
Basophils Relative: 1 % (ref 0.0–3.0)
Eosinophils Absolute: 0.1 10*3/uL (ref 0.0–0.7)
Eosinophils Relative: 1.9 % (ref 0.0–5.0)
HCT: 40.5 % (ref 36.0–46.0)
Hemoglobin: 13.2 g/dL (ref 12.0–15.0)
Lymphocytes Relative: 30.2 % (ref 12.0–46.0)
Lymphs Abs: 1.5 10*3/uL (ref 0.7–4.0)
MCHC: 32.6 g/dL (ref 30.0–36.0)
MCV: 81.8 fl (ref 78.0–100.0)
Monocytes Absolute: 0.3 10*3/uL (ref 0.1–1.0)
Monocytes Relative: 5.9 % (ref 3.0–12.0)
Neutro Abs: 3.1 10*3/uL (ref 1.4–7.7)
Neutrophils Relative %: 61 % (ref 43.0–77.0)
Platelets: 214 10*3/uL (ref 150.0–400.0)
RBC: 4.95 Mil/uL (ref 3.87–5.11)
RDW: 18.5 % — ABNORMAL HIGH (ref 11.5–15.5)
WBC: 5.1 10*3/uL (ref 4.0–10.5)

## 2021-11-20 LAB — BASIC METABOLIC PANEL
BUN: 14 mg/dL (ref 6–23)
CO2: 29 mEq/L (ref 19–32)
Calcium: 9.6 mg/dL (ref 8.4–10.5)
Chloride: 104 mEq/L (ref 96–112)
Creatinine, Ser: 0.78 mg/dL (ref 0.40–1.20)
GFR: 77.19 mL/min (ref >60.00)
Glucose, Bld: 96 mg/dL (ref 70–99)
Potassium: 4 mEq/L (ref 3.5–5.1)
Sodium: 138 mEq/L (ref 135–145)

## 2021-11-20 LAB — LIPID PANEL
Cholesterol: 171 mg/dL (ref 0–200)
HDL: 73.1 mg/dL (ref >39.00)
LDL Cholesterol: 80 mg/dL (ref 0–99)
NonHDL: 98.02
Total CHOL/HDL Ratio: 2
Triglycerides: 92 mg/dL (ref 0.0–149.0)
VLDL: 18.4 mg/dL (ref 0.0–40.0)

## 2021-11-20 LAB — HEPATIC FUNCTION PANEL
ALT: 22 U/L (ref 0–35)
AST: 23 U/L (ref 0–37)
Albumin: 4.5 g/dL (ref 3.5–5.2)
Alkaline Phosphatase: 98 U/L (ref 39–117)
Bilirubin, Direct: 0.2 mg/dL (ref 0.0–0.3)
Total Bilirubin: 0.7 mg/dL (ref 0.2–1.2)
Total Protein: 7 g/dL (ref 6.0–8.3)

## 2021-11-20 LAB — MAGNESIUM: Magnesium: 2.1 mg/dL (ref 1.5–2.5)

## 2021-11-20 LAB — TSH: TSH: 2.19 u[IU]/mL (ref 0.35–5.50)

## 2021-11-20 LAB — VITAMIN D 25 HYDROXY (VIT D DEFICIENCY, FRACTURES): VITD: 64.73 ng/mL (ref 30.00–100.00)

## 2021-11-20 NOTE — Progress Notes (Signed)
Subjective:    Patient ID: [name] [name], female    DOB: 10/08/1950, 71 y.o.   MRN: [id number]  HPI Hyperlipidemia- chronic problem, on Simvastatin 20mg  daily.  Denies CP, SOB.  No abd pain, N/V.  Hypothyroid- chronic problem, on Levothyroxine 11mcg daily.  Pt reports energy is stable.  Denies changes to skin/hair/nails  Speech problems- pt reports she will be singing in the car and her brain will know the words but her mouth won't form the words.  Will be in conversation and visualize the word she's looking for, but is unable to articulate that word.  Pt had COVID in July and notes that sxs have been more pronounced since then.  At times pt reports she has to say 'nevermind' b/c she can't remember the word.  Other times the word comes to her, it's just delayed.  Hand cramps- pt reports hx of brachial plexitis a few years ago.  Will continue to have hand spasms and cramps w/ certain fine movements.  Notes sxs will occur 2-3x/month   Review of Systems For ROS see HPI   This visit occurred during the SARS-CoV-2 public health emergency.  Safety protocols were in place, including screening questions prior to the visit, additional usage of staff PPE, and extensive cleaning of exam room while observing appropriate contact time as indicated for disinfecting solutions.      Objective:   Physical Exam Vitals reviewed.  Constitutional:      General: She is not in acute distress.    Appearance: Normal appearance. She is well-developed. She is not ill-appearing.  HENT:     Head: Normocephalic and atraumatic.  Eyes:     Conjunctiva/sclera: Conjunctivae normal.     Pupils: Pupils are equal, round, and reactive to light.  Neck:     Thyroid: No thyromegaly.  Cardiovascular:     Rate and Rhythm: Normal rate and regular rhythm.     Pulses: Normal pulses.     Heart sounds: Normal heart sounds. No murmur heard. Pulmonary:     Effort: Pulmonary effort is normal. No respiratory distress.      Breath sounds: Normal breath sounds.  Abdominal:     General: There is no distension.     Palpations: Abdomen is soft.     Tenderness: There is no abdominal tenderness.  Musculoskeletal:     Cervical back: Normal range of motion and neck supple.     Right lower leg: No edema.     Left lower leg: No edema.  Lymphadenopathy:     Cervical: No cervical adenopathy.  Skin:    General: Skin is warm and dry.  Neurological:     General: No focal deficit present.     Mental Status: She is alert and oriented to person, place, and time.     Cranial Nerves: No cranial nerve deficit.     Motor: No weakness.     Gait: Gait normal.  Psychiatric:        Mood and Affect: Mood normal.        Behavior: Behavior normal.        Thought Content: Thought content normal.          Assessment & Plan:  Expressive aphasia- new.  Pt reports she will know what word she needs in her head but is not able to say it or express it.  She finds this frustrating and is concerned for possible neurodegenerative process.  Will check labs to r/o thyroid issue or metabolic  disturbance and refer to neuro for complete evaluation.  Pt expressed understanding and is in agreement w/ plan.   Hand spasms- chronic problem for pt.  Ongoing since she had brachial plexitis.  Encouraged good water intake.  Will check K+ and Mg levels to ensure no electrolyte disturbance.

## 2021-11-20 NOTE — Patient Instructions (Addendum)
Schedule your complete physical in 6 months We'll notify you of your lab results and make any changes if needed We'll call you to schedule your Neuro appt Make sure you are drinking plenty of water to help prevent hand and leg cramps Continue to work on healthy diet and regular exercise- you can do it! Call with any questions or concerns Happy Early Birthday!!!

## 2021-11-23 ENCOUNTER — Telehealth: Payer: Self-pay

## 2021-11-23 NOTE — Telephone Encounter (Signed)
Pt aware of labs  

## 2021-11-23 NOTE — Telephone Encounter (Signed)
-----   Message from Midge Minium, MD sent at 11/23/2021  7:31 AM EST ----- Labs look great!  No changes at this time

## 2021-11-24 NOTE — Assessment & Plan Note (Signed)
Check labs and replete prn. 

## 2021-11-24 NOTE — Assessment & Plan Note (Signed)
Chronic problem.  Currently asymptomatic.  Check labs.  Adjust meds prn  

## 2021-11-24 NOTE — Assessment & Plan Note (Signed)
Chronic problem.  Tolerating Simvastatin w/o difficulty.  Check labs.  Adjust meds prn  

## 2021-11-25 ENCOUNTER — Encounter: Payer: Self-pay | Admitting: Neurology

## 2021-11-30 ENCOUNTER — Ambulatory Visit: Payer: Medicare HMO

## 2021-11-30 ENCOUNTER — Telehealth: Payer: Self-pay | Admitting: Family Medicine

## 2021-11-30 NOTE — Chronic Care Management (AMB) (Signed)
°  Chronic Care Management   Note  11/30/2021 Name: [name] [name] MRN: [id number] DOB: Feb 03, 1951  [name] [name] is a 71 y.o. year old female who is a primary care patient of Birdie Riddle, Aundra Millet, MD. I reached out to [name] [name] by phone today in response to a referral sent by Ms. Kianni L Brouillard's PCP, Midge Minium, MD.   Ms. Nienow was given information about Chronic Care Management services today including:  CCM service includes personalized support from designated clinical staff supervised by her physician, including individualized plan of care and coordination with other care providers 24/7 contact phone numbers for assistance for urgent and routine care needs. Service will only be billed when office clinical staff spend 20 minutes or more in a month to coordinate care. Only one practitioner may furnish and bill the service in a calendar month. The patient may stop CCM services at any time (effective at the end of the month) by phone call to the office staff.   Patient agreed to services and verbal consent obtained.   Follow up plan:   Tatjana Secretary/administrator

## 2021-12-03 ENCOUNTER — Ambulatory Visit: Payer: Medicare HMO

## 2021-12-04 ENCOUNTER — Other Ambulatory Visit: Payer: Self-pay

## 2021-12-04 MED ORDER — NP THYROID 60 MG PO TABS: 60.0000 mg | ORAL_TABLET | Freq: Every day | ORAL | 0 refills | Status: DC

## 2021-12-04 NOTE — Progress Notes (Signed)
? ?Assessment/Plan:  ? ?Dysphasia ? -Neuro exam is nonfocal and nonlateralizing and very similar to when I saw her 2 years ago. ? -We will proceed with MRI brain because of her complaints.  Discussed with patient prior to ordering that that we will likely see cerebral white matter disease because of age and history of hyperlipidemia.  We may see atrophy as well, common in this age group. ? -Discussed neurocognitive testing with the patient.  She was agreeable. ? ?2.  Neck pain with hand cramping ? -Discussed with patient that I do not see how this would have been related to brachial plexitis in the past.  She finds it is getting worse.  In addition, if she had brachial plexitis in the past, this should have caused significant sensory findings ? -I am going to do an MRI of the cervical spine ? -We will do an EMG ? ?3.  Follow-up after the above has been completed. ? ? ? ?Subjective:  ? ?[name] [name] was referred today by her primary care physician for "expressive aphasia."  My previous records as well as any outside records available were reviewed prior to todays visit.  Pt with husband who supplements history.  Patient was last seen in our office about 2 years ago (August, 2021).  At that point in time, she also describes some word finding trouble, but was not noted on examination.  Neurocognitive testing was offered, but declined by the patient.   ? ?Patient reports today that word finding troubles noted a few years ago have been getting worse.  Seemed to get worse after COVID in July, 2022.  She also had covid around christmas.  She will note trouble with word finding.  Husband states that she says pronouns instead of words.  She is not inserting one word for another.  She is able to do the finances.  She drives without trouble.  She manages own medications.  Her husbands states that she may misplace things - she may lay her phone down and can't find it.   ? ?Pt also described hand cramping on the R.  Chart  states that this is a "chronic problem - ongoing since she had brachial plexitis."  When asked where this was diagnosed, she relates that she saw [place] [facility].  I was able to pull up a scanned document that shows that she saw Dr. Ronnald Ramp, and apparently had an EMG that stated that it was "most consistent with brachial plexitis.  This matches her symptoms and signs.  It matches her lack of significant imaging findings at C7-T1.  I have explained the condition to her as best as I can.  I have written down for her so she can google it."  When patient was diagnosed, she only had motor complaints, and no sensory complaints.  Patient reports that symptoms are getting worse.  She describes it as an overuse syndrome.  The hand is fine and less she uses it extensively and then she will have a pulling sensation in the hand.She states that she has neck pain/trapezius cramp on the L.  She had a filing cabinet fall on her head in 1982 but the pain has gotten worse now.  She has not had previous MRI completed. ? ? ?Last neuroimaging of the brain was August, 2021.  This was a CT brain.  Personally reviewed.  It was essentially unremarkable. ? ? ?CURRENT MEDICATIONS:  ?Outpatient Encounter Medications as of 12/08/2021  ?Medication Sig  ? aspirin 325 MG  tablet Take 325 mg by mouth daily.  ? B Complex-C (SUPER B COMPLEX PO) Take 1 tablet by mouth daily.   ? brimonidine (ALPHAGAN) 0.2 % ophthalmic solution 1 drop 3 (three) times daily.  ? cetirizine (ZYRTEC) 10 MG tablet Take 10 mg by mouth daily.  ? Cholecalciferol (VITAMIN D3) 5000 UNITS CAPS Take 5,000 Units by mouth daily.   ? Coenzyme Q10 (COQ10) 200 MG CAPS Take 1 capsule by mouth daily.   ? doxycycline (ADOXA) 50 MG tablet Take 50 mg by mouth daily. For rosacea  ? Magnesium 200 MG TABS Take 200 mg by mouth in the morning and at bedtime.   ? Multiple Vitamins-Minerals (MULTIVITAMIN PO) Take 1 tablet by mouth daily.  ? NP THYROID 60 MG tablet Take 1 tablet (60 mg total) by  mouth daily before breakfast.  ? omeprazole (PRILOSEC) 20 MG capsule Take 1 capsule (20 mg total) by mouth 2 (two) times daily before a meal.  ? simvastatin (ZOCOR) 20 MG tablet TAKE 1 TABLET (20 MG TOTAL) BY MOUTH AT BEDTIME.  ? SYSTANE BALANCE 0.6 % SOLN SMARTSIG:1 Drop(s) In Eye(s) PRN  ? Travoprost, BAK Free, (TRAVATAN) 0.004 % SOLN ophthalmic solution Place 1 drop into both eyes at bedtime.   ? levothyroxine (EUTHYROX) 100 MCG tablet Take 1 tablet (100 mcg total) by mouth daily before breakfast. (Patient not taking: Reported on 12/08/2021)  ? nystatin (MYCOSTATIN) 100000 UNIT/ML suspension Take 5 mLs (500,000 Units total) by mouth 4 (four) times daily. (Patient not taking: Reported on 11/20/2021)  ? sucralfate (CARAFATE) 1 g tablet Take 1 tablet (1 g total) by mouth 4 (four) times daily -  with meals and at bedtime.  ? [DISCONTINUED] levothyroxine (EUTHYROX) 100 MCG tablet Take 1 tablet (100 mcg total) by mouth daily before breakfast.  ? ?No facility-administered encounter medications on file as of 12/08/2021.  ? ? ? ?Objective:  ? ?PHYSICAL EXAMINATION:   ? ?VITALS:   ?Vitals:  ? 12/08/21 1230  ?BP: (!) 148/82  ?Pulse: 70  ?SpO2: 96%  ?Weight: 191 lb 12.8 oz (87 kg)  ? ? ?GEN:  The patient appears stated age and is in NAD. ?HEENT:  Normocephalic, atraumatic.  The mucous membranes are moist. The superficial temporal arteries are without ropiness or tenderness. ?CV:  RRR ?Lungs:  CTAB ?Neck/HEME:  There are no carotid bruits bilaterally. ? ?Neurological examination: ? ?Orientation: The patient is alert and oriented x3. ?Cranial nerves: There is good facial symmetry.The speech is fluent and clear. Soft palate rises symmetrically and there is no tongue deviation. Hearing is intact to conversational tone. ?Sensation: Sensation is intact to light touch throughout ?Motor: Strength is at least antigravity x4. ? ?Movement examination: ?Tone: There is normal tone in the UE/LE ?Abnormal movements:  no tremor.  No myoclonus.   No asterixis.   ?Coordination:  There is no decremation with RAM's. ?Gait and Station: The patient has no difficulty arising out of a deep-seated chair without the use of the hands. The patient's stride length is good.   ?I have reviewed and interpreted the following labs independently ?Lab Results  ?Component Value Date  ? JSEGBTDV76 639 05/28/2020  ? ?  Chemistry   ?   ?Component Value Date/Time  ? NA 138 11/20/2021 0911  ? NA 139 03/18/2015 0000  ? K 4.0 11/20/2021 0911  ? CL 104 11/20/2021 0911  ? CO2 29 11/20/2021 0911  ? BUN 14 11/20/2021 0911  ? BUN 19 03/18/2015 0000  ? CREATININE 0.78 11/20/2021  0911  ? CREATININE 0.74 02/18/2017 1552  ? GLU 93 03/18/2015 0000  ?    ?Component Value Date/Time  ? CALCIUM 9.6 11/20/2021 0911  ? ALKPHOS 98 11/20/2021 0911  ? AST 23 11/20/2021 0911  ? ALT 22 11/20/2021 0911  ? BILITOT 0.7 11/20/2021 0911  ?  ? ?Lab Results  ?Component Value Date  ? TSH 2.19 11/20/2021  ? ? ? ? ?Total time spent on today's visit was 52 minutes, including both face-to-face time and nonface-to-face time.  Time included that spent on review of records (prior notes available to me/labs/imaging if pertinent), discussing treatment and goals, answering patient's questions and coordinating care. ? ?Cc:  Midge Minium, MD ? ?

## 2021-12-07 ENCOUNTER — Other Ambulatory Visit: Payer: Self-pay

## 2021-12-07 ENCOUNTER — Telehealth: Payer: Self-pay | Admitting: Pharmacist

## 2021-12-07 MED ORDER — LEVOTHYROXINE SODIUM 100 MCG PO TABS: 100.0000 ug | ORAL_TABLET | Freq: Every day | ORAL | 1 refills | Status: DC

## 2021-12-07 NOTE — Progress Notes (Signed)
? ? ?Chronic Care Management ?Pharmacy Assistant  ? ?Name: [name] [name]  MRN: [id number] DOB: [birth year] ? ?[name] [name] is an 71 y.o. year old female who presents for her initial CCM visit with the clinical pharmacist. ? ?Reason for Encounter: Chart Prep for Initial Visit on 12/09/21 with CPP ? ?  ?Recent office visits:  ?11/20/21 Annye Asa, MD - Family Medicine- Mixed Hyperlipidemia - Labs were ordered. Referral to Neurology placed - No medication changes. Follow up in 6 months. ? ?10/28/21 Nedra Hai, NP - Sinusitis - azelastine (ASTELIN) 0.1 % nasal spray and azithromycin (ZITHROMAX) 250 MG tablet were prescribed.  ? ?Recent consult visits:  ?08/05/21 Fredricka Bonine, MD - ENT - Malignant Melanoma of neck - Follow up in 6 months for surveillance.  ? ?07/30/21 Physical Therapy - right shoulder pain ? ?07/17/21  Physical Therapy - right shoulder pain ? ?10/13/2 S[facility] de [place] - no notes available ? ?07/15/21 Linus Galas - Dermatology - Malignant melanoma of skin - No notes available.  ? ?07/01/21 Hollar, Clement Husbands - Dermatology - Maligant melanoma of skin - No notes available.  ? ?06/29/21  Physical Therapy - right shoulder pain ? ?06/24/21  Physical Therapy - right shoulder pain ? ?06/11/21  Physical Therapy - right shoulder pain ? ? ?Hospital visits:  ?None in previous 6 months ? ?Medications: ?Outpatient Encounter Medications as of 12/07/2021  ?Medication Sig  ? aspirin 325 MG tablet Take 325 mg by mouth daily.  ? B Complex-C (SUPER B COMPLEX PO) Take 1 tablet by mouth daily.   ? brimonidine (ALPHAGAN) 0.2 % ophthalmic solution 1 drop 3 (three) times daily.  ? cetirizine (ZYRTEC) 10 MG tablet Take 10 mg by mouth daily.  ? Cholecalciferol (VITAMIN D3) 5000 UNITS CAPS Take 5,000 Units by mouth daily.   ? Coenzyme Q10 (COQ10) 200 MG CAPS Take 1 capsule by mouth daily.   ? doxycycline (ADOXA) 50 MG tablet Take 50 mg by mouth daily. For rosacea  ? levothyroxine (EUTHYROX) 100 MCG tablet Take  1 tablet (100 mcg total) by mouth daily before breakfast.  ? Magnesium 200 MG TABS Take 200 mg by mouth in the morning and at bedtime.   ? Multiple Vitamins-Minerals (MULTIVITAMIN PO) Take 1 tablet by mouth daily.  ? NP THYROID 60 MG tablet Take 1 tablet (60 mg total) by mouth daily before breakfast.  ? nystatin (MYCOSTATIN) 100000 UNIT/ML suspension Take 5 mLs (500,000 Units total) by mouth 4 (four) times daily. (Patient not taking: Reported on 11/20/2021)  ? omeprazole (PRILOSEC) 20 MG capsule Take 1 capsule (20 mg total) by mouth 2 (two) times daily before a meal.  ? simvastatin (ZOCOR) 20 MG tablet TAKE 1 TABLET (20 MG TOTAL) BY MOUTH AT BEDTIME.  ? sucralfate (CARAFATE) 1 g tablet Take 1 tablet (1 g total) by mouth 4 (four) times daily -  with meals and at bedtime.  ? SYSTANE BALANCE 0.6 % SOLN SMARTSIG:1 Drop(s) In Eye(s) PRN  ? Travoprost, BAK Free, (TRAVATAN) 0.004 % SOLN ophthalmic solution Place 1 drop into both eyes at bedtime.   ? ?No facility-administered encounter medications on file as of 12/07/2021.  ? ? ?Have you seen any other providers since your last visit?  No ? ?Any changes in your medications or health? no ? ?Any side effects from any medications? no ? ?Do you have an symptoms or problems not managed by your medications? no ? ?Any concerns about your health right now? no ? ?Has your provider  asked that you check blood pressure, blood sugar, or follow special diet at home? no ? ?Do you get any type of exercise on a regular basis? Yes patient walks daily.  ? ?Can you think of a goal you would like to reach for your health? Patient report she would like to lose about 40 pounds.  ? ?Do you have any problems getting your medications? no ? ?Is there anything that you would like to discuss during the appointment? Patient would like to address when she should take certain medications and a schedule.  ? ?Please bring medications and supplements to appointment. Patient confirmed appointment date and time.   ? ? ?Care Gaps ? ?AWV: done 11/24/20 ?Colonoscopy: done 03/29/17 ?DM Eye Exam: N/A ?DM Foot Exam: N/A ?Microalbumin: N/A ?HbgAIC: N/A ?DEXA: done 12/30/17 ?Mammogram: done 08/14/20 ? ? ?Star Rating Drugs: ?simvastatin (ZOCOR) 20 MG tablet - last filled 10/21/21 90 days ? ?Future Appointments  ?Date Time Provider [place]  ?12/08/2021  9:45 AM Tat, Eustace Quail, DO [facility] None  ?12/09/2021 11:00 AM [facility] CCM PHARMACIST [facility]  ?12/10/2021  1:30 PM [facility] HEALTH COACH [facility]  ? ? ?Liza Showfety, CCMA ?Clinical Pharmacist Assistant  ?([phone] ? ?

## 2021-12-08 ENCOUNTER — Other Ambulatory Visit: Payer: Self-pay

## 2021-12-08 ENCOUNTER — Ambulatory Visit: Payer: Medicare HMO | Admitting: Neurology

## 2021-12-08 ENCOUNTER — Encounter: Payer: Self-pay | Admitting: Neurology

## 2021-12-08 VITALS — BP 131/82 | HR 70 | Ht 66.0 in | Wt 191.8 lb

## 2021-12-08 DIAGNOSIS — M79602 Pain in left arm: Secondary | ICD-10-CM | POA: Diagnosis not present

## 2021-12-08 DIAGNOSIS — R413 Other amnesia: Secondary | ICD-10-CM

## 2021-12-08 DIAGNOSIS — M542 Cervicalgia: Secondary | ICD-10-CM | POA: Diagnosis not present

## 2021-12-08 DIAGNOSIS — R4702 Dysphasia: Secondary | ICD-10-CM

## 2021-12-08 DIAGNOSIS — M5412 Radiculopathy, cervical region: Secondary | ICD-10-CM

## 2021-12-08 DIAGNOSIS — M79601 Pain in right arm: Secondary | ICD-10-CM | POA: Diagnosis not present

## 2021-12-08 NOTE — Patient Instructions (Signed)
You have been referred for a neurocognitive evaluation (i.e., evaluation of memory and thinking abilities). Please bring someone with you to this appointment if possible, as it is helpful for the neuropsychologist to hear from both you and another adult who knows you well. Please bring eyeglasses and hearing aids if you wear them and take any medications as you normally would.    The evaluation will take approximately 2-3 hours and has two parts:   The first part is a clinical interview with the neuropsychologist, Dr. Merz.  During the interview, the neuropsychologist will speak with you and the individual you brought to the appointment.    The second part of the evaluation is testing with the doctor's technician, aka psychometrician, Dana or Kim. During the testing, the technician will ask you to remember different types of material, solve problems, and answer some questionnaires. Your family member will not be present for this portion of the evaluation.   Please note: We have to reserve several hours of the neuropsychologist's time and the psychometrician's time for your evaluation appointment. As such, there is a No-Show fee of $100. If you are unable to attend any of your appointments, please contact our office as soon as possible to reschedule.  

## 2021-12-09 ENCOUNTER — Ambulatory Visit (INDEPENDENT_AMBULATORY_CARE_PROVIDER_SITE_OTHER): Payer: Medicare HMO | Admitting: Pharmacist

## 2021-12-09 DIAGNOSIS — E782 Mixed hyperlipidemia: Secondary | ICD-10-CM

## 2021-12-09 DIAGNOSIS — E038 Other specified hypothyroidism: Secondary | ICD-10-CM

## 2021-12-09 DIAGNOSIS — I2510 Atherosclerotic heart disease of native coronary artery without angina pectoris: Secondary | ICD-10-CM

## 2021-12-09 NOTE — Patient Instructions (Addendum)
Visit Information ? ? Goals Addressed   ? ?  ?  ?  ?  ? This Visit's Progress  ?  Improve cramping     ?  Timeframe:  Long-Range Goal ?Priority:  High ?Start Date:  12/09/21                           ?Expected End Date:  06/11/22                    ? ?Follow Up Date 03/11/22  ?  ?Work toward solutions to improve cramping symptoms  ?  ?Why is this important?   ?These steps will help you keep on track with your medicines. ?  ?Notes:  ?  ? ?  ? ?Patient Care Plan: General Pharmacy (Adult)  ?  ? ?Problem Identified: Hypothyroidism, HLD, Coronary artery calcification,   ?Priority: High  ?Onset Date: 12/09/2021  ?  ? ?Long-Range Goal: Patient-Specific Goal   ?Start Date: 12/09/2021  ?Expected End Date: 06/11/2022  ?This Visit's Progress: On track  ?Priority: High  ?Note:   ?Current Barriers:  ?Ongoing cramping and muscle aches ? ?Pharmacist Clinical Goal(s):  ?Patient will achieve improvement in cramping and muscle symptoms as evidenced by symptom level through collaboration with PharmD and provider.  ? ?Interventions: ?1:1 collaboration with Midge Minium, MD regarding development and update of comprehensive plan of care as evidenced by provider attestation and co-signature ?Inter-disciplinary care team collaboration (see longitudinal plan of care) ?Comprehensive medication review performed; medication list updated in electronic medical record ? ?Hyperlipidemia: (LDL goal < 100) ?-Controlled ?-Current treatment: ?Simvastatin '20mg'$   daily Appropriate, Effective, Query Safe ?-Medications previously tried: none noted  ?-Current exercise habits: walks almost daily ?-Educated on Cholesterol goals;  ?Benefits of statin for ASCVD risk reduction; ?Importance of limiting foods high in cholesterol; ?One of her main concerns at this time is ongoing muscle pain and cramping that has been going on for quite some time.  She states now the issue is in her neck but it started with cramping in her toes and calves.  She has been taking the same  dose of simvastatin for at least 10 years and reports the cramps have been going on for at least this long. ?-Will consult with Dr. Birdie Riddle, I do want her to stay on statin due to family history and her coronary artery calcification, however she may benefit from switching to Crestor which is thought to cause less muscle pain or cramping.  It may also be beneficial to check a CK to make sure she is not having rhabdo.  Will FU up with patient post consultation. ? ? ?Hypothyroidism (Goal: Maintain TSH) ?-Controlled ?-Current treatment  ?NP Thyroid '60mg'$  daily Appropriate, Effective, Safe, Accessible ?-Medications previously tried: levothyroxine ?-She takes either levothyroxine or NP thyroid depending on what is available. ?TSH has been WNL recently  ?-Recommended to continue current medication ?No changes needed continue to take 30-60 minutes before food or other medicaitons ? ?Coronary artery calcification (Goal: Reduce CV risk) ?-Controlled ?-Current treatment  ?None ?-Controlling risk factors, also has severe family history of MI, heart disease ?-She is on statin appropriately ?-Recommended to continue current medication ? ?GERD (Goal: Minimize symptoms) ?-Controlled ?-Current treatment  ?Omeprazole '20mg'$  daily Query Appropriate,, ,  ?-Medications previously tried: none noted ?-Long term PPI therapy, most recent DEXA scan showed low bone density in March 2022. ?Recommend repeat DEXA 2 years later.  She states this really helps  symptoms.  ?-Recommended to continue current medication ?Consider step down in the future, work on trigger foods for now. ? ?Patient Goals/Self-Care Activities ?Patient will:  ?- take medications as prescribed as evidenced by patient report and record review ?Achieve improvement in cramping/muscle pains. ? ?Follow Up Plan: The care management team will reach out to the patient again over the next 180 days.  ?  ? ? ?[name] [name] was given information about Chronic Care Management services today  including:  ?CCM service includes personalized support from designated clinical staff supervised by her physician, including individualized plan of care and coordination with other care providers ?24/7 contact phone numbers for assistance for urgent and routine care needs. ?Standard insurance, coinsurance, copays and deductibles apply for chronic care management only during months in which we provide at least 20 minutes of these services. Most insurances cover these services at 100%, however patients may be responsible for any copay, coinsurance and/or deductible if applicable. This service may help you avoid the need for more expensive face-to-face services. ?Only one practitioner may furnish and bill the service in a calendar month. ?The patient may stop CCM services at any time (effective at the end of the month) by phone call to the office staff. ? ?Patient agreed to services and verbal consent obtained.  ? ?The patient verbalized understanding of instructions, educational materials, and care plan provided today and agreed to receive a mailed copy of patient instructions, educational materials, and care plan.  ?Telephone follow up appointment with pharmacy team member scheduled for: 6 months ? ?[name] [name], [facility]  ?[name] [name], PharmD ?Clinical Pharmacist  ?[place] ?[phone] ? ?

## 2021-12-09 NOTE — Progress Notes (Signed)
Chronic Care Management Pharmacy Note  12/09/2021 Name:  [name] [name] MRN:  [id number] DOB:  [birth year]  Summary: Initial visit with PharmD.  All meds reviewed, her main concern today was the muscle cramping she has been having for quite some time.  She is going to see a neurologist but they cannot do testing until October.  The cramps have now moved into her neck.  Recommendations/Changes made from today's visit: Consider checking CK for rhabdo or switch to Crestor to see if this improves any of her cramping symptoms  Plan: FU 6 months   Subjective: [name] [name] is an 71 y.o. year old female who is a primary patient of Tabori, Aundra Millet, MD.  The CCM team was consulted for assistance with disease management and care coordination needs.    Engaged with patient by telephone for initial visit in response to provider referral for pharmacy case management and/or care coordination services.   Consent to Services:  The patient was given the following information about Chronic Care Management services today, agreed to services, and gave verbal consent: 1. CCM service includes personalized support from designated clinical staff supervised by the primary care provider, including individualized plan of care and coordination with other care providers 2. 24/7 contact phone numbers for assistance for urgent and routine care needs. 3. Service will only be billed when office clinical staff spend 20 minutes or more in a month to coordinate care. 4. Only one practitioner may furnish and bill the service in a calendar month. 5.The patient may stop CCM services at any time (effective at the end of the month) by phone call to the office staff. 6. The patient will be responsible for cost sharing (co-pay) of up to 20% of the service fee (after annual deductible is met). Patient agreed to services and consent obtained.  Patient Care Team: Midge Minium, MD as PCP - General (Family Medicine) Berniece Salines, DO as PCP - Cardiology (Cardiology) Roque Cash., MD as Consulting Physician (Obstetrics and Gynecology) Susie Cassette, Katharine Look, MD as Referring Physician (Dermatology) Elsie Saas, MD as Consulting Physician (Orthopedic Surgery) Regal, Tamala Fothergill, DPM as Consulting Physician (Podiatry) Janann August, MD as Referring Physician (Dermatology) Darleen Crocker, MD as Consulting Physician (Ophthalmology) Madelin Rear, [facility] as Pharmacist (Pharmacist) Edythe Clarity, [facility] as Pharmacist (Pharmacist) Recent office visits:  11/20/21 Annye Asa, MD - Family Medicine- Mixed Hyperlipidemia - Labs were ordered. Referral to Neurology placed - No medication changes. Follow up in 6 months.   10/28/21 Nedra Hai, NP - Sinusitis - azelastine (ASTELIN) 0.1 % nasal spray and azithromycin (ZITHROMAX) 250 MG tablet were prescribed.    Recent consult visits:  08/05/21 Fredricka Bonine, MD - ENT - Malignant Melanoma of neck - Follow up in 6 months for surveillance 07/30/21 Physical Therapy - right shoulder pain   07/17/21  Physical Therapy - right shoulder pain   10/13/2 [place] - no notes available   07/15/21 Hollar, Clement Husbands - Dermatology - Malignant melanoma of skin - No notes available.    07/01/21 Hollar, [place] - Dermatology - Maligant melanoma of skin - No notes available.    06/29/21  Physical Therapy - right shoulder pain   06/24/21  Physical Therapy - right shoulder pain   06/11/21  Physical Therapy - right shoulder pain     Hospital visits:  None in previous 6 months   Objective:  Lab Results  Component Value Date   CREATININE 0.78 11/20/2021  BUN 14 11/20/2021   GFR 77.19 11/20/2021   GFRNONAA >60 01/05/2017   GFRAA >60 01/05/2017   NA 138 11/20/2021   K 4.0 11/20/2021   CALCIUM 9.6 11/20/2021   CO2 29 11/20/2021   GLUCOSE 96 11/20/2021    Lab Results  Component Value Date/Time   HGBA1C 5.5 03/18/2015 12:00 AM   GFR 77.19 11/20/2021  09:11 AM   GFR 77.47 05/20/2021 10:32 AM    Last diabetic Eye exam: No results found for: HMDIABEYEEXA  Last diabetic Foot exam: No results found for: HMDIABFOOTEX   Lab Results  Component Value Date   CHOL 171 11/20/2021   HDL 73.10 11/20/2021   LDLCALC 80 11/20/2021   TRIG 92.0 11/20/2021   CHOLHDL 2 11/20/2021    Hepatic Function Latest Ref Rng & Units 11/20/2021 05/20/2021 11/20/2020  Total Protein 6.0 - 8.3 g/dL 7.0 7.3 7.1  Albumin 3.5 - 5.2 g/dL 4.5 4.4 4.2  AST 0 - 37 U/L _0 ALT 0 - 35 U/L _1 Alk Phosphatase 39 - 117 U/L 98 97 80  Total Bilirubin 0.2 - 1.2 mg/dL 0.7 0.7 0.7  Bilirubin, Direct 0.0 - 0.3 mg/dL 0.2 - 0.1    Lab Results  Component Value Date/Time   TSH 2.19 11/20/2021 09:11 AM   TSH 1.42 05/20/2021 10:32 AM   FREET4 1.1 02/18/2017 03:52 PM    CBC Latest Ref Rng & Units 11/20/2021 05/20/2021 11/20/2020  WBC 4.0 - 10.5 K/uL 5.1 5.9 7.7  Hemoglobin 12.0 - 15.0 g/dL 13.2 13.3 12.9  Hematocrit 36.0 - 46.0 % 40.5 41.1 38.6  Platelets 150.0 - 400.0 K/uL 214.0 241.0 339.0    Lab Results  Component Value Date/Time   VD25OH 64.73 11/20/2021 09:11 AM   VD25OH 52.00 11/20/2020 08:42 AM    Clinical ASCVD: No  The 10-year ASCVD risk score (Arnett DK, et al., 2019) is: 9%   Values used to calculate the score:     Age: 67 years     Sex: Female     Is Non-Hispanic African American: No     Diabetic: No     Tobacco smoker: No     Systolic Blood Pressure: 517 mmHg     Is BP treated: No     HDL Cholesterol: 73.1 mg/dL     Total Cholesterol: 171 mg/dL    Depression screen [facility] 2/9 11/20/2021 10/28/2021 05/20/2021  Decreased Interest 0 0 0  Down, Depressed, Hopeless 0 0 0  PHQ - 2 Score 0 0 0  Altered sleeping 0 0 0  Tired, decreased energy 0 0 0  Change in appetite 0 0 0  Feeling bad or failure about yourself  0 0 0  Trouble concentrating 0 0 0  Moving slowly or fidgety/restless 0 0 0  Suicidal thoughts 0 0 0  PHQ-9 Score 0 0 0  Difficult doing  work/chores Not difficult at all Not difficult at all Not difficult at all  Some recent data might be hidden     Social History   Tobacco Use  Smoking Status Former   Years: 6.00   Types: Cigarettes   Quit date: 10/05/1975   Years since quitting: 46.2  Smokeless Tobacco Never   BP Readings from Last 3 Encounters:  12/08/21 131/82  11/20/21 114/72  10/28/21 125/76   Pulse Readings from Last 3 Encounters:  12/08/21 70  11/20/21 70  10/28/21 81   Wt Readings from Last 3 Encounters:  12/08/21 191 lb  12.8 oz (87 kg)  11/20/21 192 lb 3.2 oz (87.2 kg)  10/28/21 186 lb (84.4 kg)   BMI Readings from Last 3 Encounters:  12/08/21 30.96 kg/m  11/20/21 31.02 kg/m  10/28/21 30.02 kg/m    Assessment/Interventions: Review of patient past medical history, allergies, medications, health status, including review of consultants reports, laboratory and other test data, was performed as part of comprehensive evaluation and provision of chronic care management services.   SDOH:  (Social Determinants of Health) assessments and interventions performed: Yes  Financial Resource Strain: Low Risk    Difficulty of Paying Living Expenses: Not hard at all   Food Insecurity: No Food Insecurity   Worried About Charity fundraiser in the Last Year: Never true   Arboriculturist in the Last Year: Never true    SDOH Screenings   Alcohol Screen: Not on file  Depression (PHQ2-9): Low Risk    PHQ-2 Score: 0  Financial Resource Strain: Low Risk    Difficulty of Paying Living Expenses: Not hard at all  Food Insecurity: No Food Insecurity   Worried About Charity fundraiser in the Last Year: Never true   Ran Out of Food in the Last Year: Never true  Housing: Not on file  Physical Activity: Not on file  Social Connections: Not on file  Stress: Not on file  Tobacco Use: Medium Risk   Smoking Tobacco Use: Former   Smokeless Tobacco Use: Never   Passive Exposure: Not on file  Transportation Needs:  Not on file    [place]  Allergies  Allergen Reactions   Clindamycin/Lincomycin Diarrhea   Flagyl [Metronidazole Hcl] Diarrhea    Stomach upset. Pt states she CANNOT TAKE oral, has no problem with topical.    Augmentin [Amoxicillin-Pot Clavulanate] Diarrhea   Telithromycin Diarrhea    Medications Reviewed Today     Reviewed by Edythe Clarity, [facility] (Pharmacist) on 12/09/21 at 1156  Med List Status: <None>   Medication Order Taking? Sig Documenting Provider Last Dose Status Informant  aspirin 325 MG tablet [id number] Yes Take 325 mg by mouth daily. [provider] Taking Active Self           Med Note Laurance Flatten, PATINA S   Mon Jan 09, 2018  1:13 PM)    B Complex-C (SUPER B COMPLEX PO) [id number] Yes Take 1 tablet by mouth daily.  [provider] Taking Active   brimonidine (ALPHAGAN) 0.2 % ophthalmic solution [id number] Yes 1 drop 3 (three) times daily. [provider] Taking Active   cetirizine (ZYRTEC) 10 MG tablet [id number] Yes Take 10 mg by mouth daily. [provider] Taking Active   Cholecalciferol (VITAMIN D3) 5000 UNITS CAPS [id number] Yes Take 5,000 Units by mouth daily.  [provider] Taking Active Self  Coenzyme Q10 (COQ10) 200 MG CAPS [id number] Yes Take 1 capsule by mouth daily.  [provider] Taking Active   doxycycline (ADOXA) 50 MG tablet [id number] Yes Take 50 mg by mouth daily. For rosacea [provider] Taking Active Self  levothyroxine (EUTHYROX) 100 MCG tablet [id number] No Take 1 tablet (100 mcg total) by mouth daily before breakfast.  Patient not taking: Reported on 12/08/2021   Midge Minium, MD Not Taking Active   Magnesium 200 MG TABS [id number] Yes Take 200 mg by mouth in the morning and at bedtime.  [provider] Taking Active Self           Med Note (YOUNG,  TIERICA T   Wed May 28, 2020 10:35 AM)    Multiple Vitamins-Minerals (MULTIVITAMIN PO) [id number] Yes Take 1 tablet by mouth  daily. [provider] Taking Active Self  NP THYROID 60 MG tablet [id number] Yes Take 1 tablet (60 mg total) by mouth daily before breakfast. Midge Minium, MD Taking Active   omeprazole (PRILOSEC) 20 MG capsule [id number] Yes Take 1 capsule (20 mg total) by mouth 2 (two) times daily before a meal. Maximiano Coss, NP Taking Active   simvastatin (ZOCOR) 20 MG tablet [id number] Yes TAKE 1 TABLET (20 MG TOTAL) BY MOUTH AT BEDTIME. Midge Minium, MD Taking Active   SYSTANE BALANCE 0.6 % Bailey Mech [id number] Yes SMARTSIG:1 Drop(s) In Eye(s) PRN [provider] Taking Active   Travoprost, BAK Free, (TRAVATAN) 0.004 % SOLN ophthalmic solution [id number] Yes Place 1 drop into both eyes at bedtime.  [provider] Taking Active             Patient Active Problem List   Diagnosis Date Noted   Precordial pain 04/09/2021   DOE (dyspnea on exertion) 04/09/2021   Coronary artery calcification seen on CAT scan 04/09/2021   Mixed hyperlipidemia 04/09/2021   Cataract 03/16/2021   Glaucoma 03/16/2021   Skin cancer (melanoma) ([place]) 03/16/2021   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/21/2021   Malignant melanoma of neck ([place]) 10/09/2020   Tremor of both hands 05/21/2020   Incontinence of feces    Bloating 08/22/2019   Fecal soiling 08/22/2019   Contact with and (suspected) exposure to other viral communicable diseases 07/03/2019   Diarrhea 06/30/2019   Headache 06/30/2019   Obesity (BMI 30-39.9) 11/15/2018   Anxiety about health 05/17/2018   Hx of adenomatous colonic polyps 01/17/2017   Postoperative nausea and vomiting 09/02/2015   Primary localized osteoarthritis of left knee    IBS (irritable bowel syndrome)    Physical exam 05/30/2015   Hypothyroidism 11/22/2014   Vitamin D deficiency 09/21/2009   SKIN CANCER, HX OF 09/15/2009   GERD 12/19/2007   ALLERGY 12/19/2007    Immunization History  Administered Date(s) Administered   Fluad Quad(high Dose  65+) 06/04/2019, 07/02/2021, 07/16/2021   Influenza Split 08/04/2012   Influenza, High Dose Seasonal PF 06/12/2018, 06/03/2020   Influenza, Seasonal, Injecte, Preservative Fre 06/04/2019   Influenza,inj,Quad PF,6+ Mos 11/22/2014, 07/01/2015, 06/04/2016, 07/25/2017   Influenza-Unspecified 07/03/2021   PFIZER(Purple Top)SARS-COV-2 Vaccination 10/26/2019, 11/13/2019, 06/19/2020, 02/11/2021   PNEUMOCOCCAL CONJUGATE-20 05/20/2021   Pfizer Covid-19 Vaccine Bivalent Booster 52yr & up 07/16/2021   Pneumococcal Conjugate-13 05/17/2018   Tdap 10/01/2011, 05/20/2021   Zoster Recombinat (Shingrix) 07/25/2017, 12/22/2017   Zoster, Live 12/03/2015    Conditions to be addressed/monitored:  Hypothyroidism, HLD, Coronary artery calcification, IBS  Care Plan : General Pharmacy (Adult)  Updates made by DEdythe Clarity RPH since 12/09/2021 12:00 AM     Problem: Hypothyroidism, HLD, Coronary artery calcification,   Priority: High  Onset Date: 12/09/2021     Long-Range Goal: Patient-Specific Goal   Start Date: 12/09/2021  Expected End Date: 06/11/2022  This Visit's Progress: On track  Priority: High  Note:   Current Barriers:  Ongoing cramping and muscle aches  Pharmacist Clinical Goal(s):  Patient will achieve improvement in cramping and muscle symptoms as evidenced by symptom level through collaboration with PharmD and provider.   Interventions: 1:1 collaboration with TMidge Minium MD regarding development and update of comprehensive plan of care as evidenced by provider attestation and co-signature Inter-disciplinary care team collaboration (see  longitudinal plan of care) Comprehensive medication review performed; medication list updated in electronic medical record  Hyperlipidemia: (LDL goal < 100) -Controlled -Current treatment: Simvastatin 65m  daily Appropriate, Effective, Query Safe -Medications previously tried: none noted  -Current exercise habits: walks almost  daily -Educated on Cholesterol goals;  Benefits of statin for ASCVD risk reduction; Importance of limiting foods high in cholesterol; One of her main concerns at this time is ongoing muscle pain and cramping that has been going on for quite some time.  She states now the issue is in her neck but it started with cramping in her toes and calves.  She has been taking the same dose of simvastatin for at least 10 years and reports the cramps have been going on for at least this long. -Will consult with Dr. TBirdie Riddle I do want her to stay on statin due to family history and her coronary artery calcification, however she may benefit from switching to Crestor which is thought to cause less muscle pain or cramping.  It may also be beneficial to check a CK to make sure she is not having rhabdo.  Will FU up with patient post consultation.   Hypothyroidism (Goal: Maintain TSH) -Controlled -Current treatment  NP Thyroid 638mdaily Appropriate, Effective, Safe, Accessible -Medications previously tried: levothyroxine -She takes either levothyroxine or NP thyroid depending on what is available. TSH has been WNL recently  -Recommended to continue current medication No changes needed continue to take 30-60 minutes before food or other medicaitons  Coronary artery calcification (Goal: Reduce CV risk) -Controlled -Current treatment  None -Controlling risk factors, also has severe family history of MI, heart disease -She is on statin appropriately -Recommended to continue current medication  GERD (Goal: Minimize symptoms) -Controlled -Current treatment  Omeprazole 2068maily Query Appropriate,, ,  -Medications previously tried: none noted -Long term PPI therapy, most recent DEXA scan showed low bone density in March 2022. Recommend repeat DEXA 2 years later.  She states this really helps symptoms.  -Recommended to continue current medication Consider step down in the future, work on trigger foods for  now.  Patient Goals/Self-Care Activities Patient will:  - take medications as prescribed as evidenced by patient report and record review Achieve improvement in cramping/muscle pains.  Follow Up Plan: The care management team will reach out to the patient again over the next 180 days.       Medication Assistance: None required.  Patient affirms current coverage meets needs.  Compliance/Adherence/Medication fill history: Care Gaps: Mammogram  Star-Rating Drugs: simvastatin (ZOCOR) 20 MG tablet - last filled 10/21/21 90 days  [facility]  [facility] [place] [address]one: [phone]: [phone] [address]one: [phone]: [phone] Hospital[facility] [id number] [place]one: [phone]: [phone]/pharmacy #[id number] BEACEnnis -[place]ne: [phone]: [phone] pill box? Yes Pt endorses 100% compliance  We discussed: Benefits of medication synchronization, packaging and delivery as well as enhanced pharmacist oversight with [employer]. Patient decided to: Continue current medication management strategy  Care Plan and Follow Up Patient Decision:  Patient agrees to Care Plan and Follow-up.  Plan: The care management team will reach out to the patient again over the next 180 days.  Beverly Milch, PharmD Clinical Pharmacist  [facility] [phone]

## 2021-12-10 ENCOUNTER — Ambulatory Visit (INDEPENDENT_AMBULATORY_CARE_PROVIDER_SITE_OTHER): Payer: Medicare HMO

## 2021-12-10 DIAGNOSIS — Z1211 Encounter for screening for malignant neoplasm of colon: Secondary | ICD-10-CM

## 2021-12-10 DIAGNOSIS — Z1231 Encounter for screening mammogram for malignant neoplasm of breast: Secondary | ICD-10-CM | POA: Diagnosis not present

## 2021-12-10 DIAGNOSIS — Z0000 Encounter for general adult medical examination without abnormal findings: Secondary | ICD-10-CM

## 2021-12-10 NOTE — Patient Instructions (Signed)
[name] [name] , Thank you for taking time to come for your Medicare Wellness Visit. I appreciate your ongoing commitment to your health goals. Please review the following plan we discussed and let me know if I can assist you in the future.   Screening recommendations/referrals: Colonoscopy: referral 12/10/2021 Mammogram: referral 12/10/2021 Bone Density: 12/03/2020 Recommended ye/arly ophthalmology/optometry visit for glaucoma screening and checkup Recommended yearly dental visit for hygiene and checkup  Vaccinations: Influenza vaccine: completed  Pneumococcal vaccine: completed  Tdap vaccine: 05/20/2021 Shingles vaccine: completed     Advanced directives: none   Conditions/risks identified: none   Next appointment: none    Preventive Care 70 Years and Older, Female Preventive care refers to lifestyle choices and visits with your health care provider that can promote health and wellness. What does preventive care include? A yearly physical exam. This is also called an annual well check. Dental exams once or twice a year. Routine eye exams. Ask your health care provider how often you should have your eyes checked. Personal lifestyle choices, including: Daily care of your teeth and gums. Regular physical activity. Eating a healthy diet. Avoiding tobacco and drug use. Limiting alcohol use. Practicing safe sex. Taking low-dose aspirin every day. Taking vitamin and mineral supplements as recommended by your health care provider. What happens during an annual well check? The services and screenings done by your health care provider during your annual well check will depend on your age, overall health, lifestyle risk factors, and family history of disease. Counseling  Your health care provider may ask you questions about your: Alcohol use. Tobacco use. Drug use. Emotional well-being. Home and relationship well-being. Sexual activity. Eating habits. History of falls. Memory and  ability to understand (cognition). Work and work Statistician. Reproductive health. Screening  You may have the following tests or measurements: Height, weight, and BMI. Blood pressure. Lipid and cholesterol levels. These may be checked every 5 years, or more frequently if you are over 70 years old. Skin check. Lung cancer screening. You may have this screening every year starting at age 2 if you have a 30-pack-year history of smoking and currently smoke or have quit within the past 15 years. Fecal occult blood test (FOBT) of the stool. You may have this test every year starting at age 65. Flexible sigmoidoscopy or colonoscopy. You may have a sigmoidoscopy every 5 years or a colonoscopy every 10 years starting at age 70. Hepatitis C blood test. Hepatitis B blood test. Sexually transmitted disease (STD) testing. Diabetes screening. This is done by checking your blood sugar (glucose) after you have not eaten for a while (fasting). You may have this done every 1-3 years. Bone density scan. This is done to screen for osteoporosis. You may have this done starting at age 70. Mammogram. This may be done every 1-2 years. Talk to your health care provider about how often you should have regular mammograms. Talk with your health care provider about your test results, treatment options, and if necessary, the need for more tests. Vaccines  Your health care provider may recommend certain vaccines, such as: Influenza vaccine. This is recommended every year. Tetanus, diphtheria, and acellular pertussis (Tdap, Td) vaccine. You may need a Td booster every 10 years. Zoster vaccine. You may need this after age 70. Pneumococcal 13-valent conjugate (PCV13) vaccine. One dose is recommended after age 70. Pneumococcal polysaccharide (PPSV23) vaccine. One dose is recommended after age 70. Talk to your health care provider about which screenings and vaccines you need and how  often you need them. This information is  not intended to replace advice given to you by your health care provider. Make sure you discuss any questions you have with your health care provider. Document Released: 10/17/2015 Document Revised: 06/09/2016 Document Reviewed: 07/22/2015 Elsevier Interactive Patient Education  2017 [place] Prevention in the Home Falls can cause injuries. They can happen to people of all ages. There are many things you can do to make your home safe and to help prevent falls. What can I do on the outside of my home? Regularly fix the edges of walkways and driveways and fix any cracks. Remove anything that might make you trip as you walk through a door, such as a raised step or threshold. Trim any bushes or trees on the path to your home. Use bright outdoor lighting. Clear any walking paths of anything that might make someone trip, such as rocks or tools. Regularly check to see if handrails are loose or broken. Make sure that both sides of any steps have handrails. Any raised decks and porches should have guardrails on the edges. Have any leaves, snow, or ice cleared regularly. Use sand or salt on walking paths during winter. Clean up any spills in your garage right away. This includes oil or grease spills. What can I do in the bathroom? Use night lights. Install grab bars by the toilet and in the tub and shower. Do not use towel bars as grab bars. Use non-skid mats or decals in the tub or shower. If you need to sit down in the shower, use a plastic, non-slip stool. Keep the floor dry. Clean up any water that spills on the floor as soon as it happens. Remove soap buildup in the tub or shower regularly. Attach bath mats securely with double-sided non-slip rug tape. Do not have throw rugs and other things on the floor that can make you trip. What can I do in the bedroom? Use night lights. Make sure that you have a light by your bed that is easy to reach. Do not use any sheets or blankets that  are too big for your bed. They should not hang down onto the floor. Have a firm chair that has side arms. You can use this for support while you get dressed. Do not have throw rugs and other things on the floor that can make you trip. What can I do in the kitchen? Clean up any spills right away. Avoid walking on wet floors. Keep items that you use a lot in easy-to-reach places. If you need to reach something above you, use a strong step stool that has a grab bar. Keep electrical cords out of the way. Do not use floor polish or wax that makes floors slippery. If you must use wax, use non-skid floor wax. Do not have throw rugs and other things on the floor that can make you trip. What can I do with my stairs? Do not leave any items on the stairs. Make sure that there are handrails on both sides of the stairs and use them. Fix handrails that are broken or loose. Make sure that handrails are as long as the stairways. Check any carpeting to make sure that it is firmly attached to the stairs. Fix any carpet that is loose or worn. Avoid having throw rugs at the top or bottom of the stairs. If you do have throw rugs, attach them to the floor with carpet tape. Make sure that you have a  light switch at the top of the stairs and the bottom of the stairs. If you do not have them, ask someone to add them for you. What else can I do to help prevent falls? Wear shoes that: Do not have high heels. Have rubber bottoms. Are comfortable and fit you well. Are closed at the toe. Do not wear sandals. If you use a stepladder: Make sure that it is fully opened. Do not climb a closed stepladder. Make sure that both sides of the stepladder are locked into place. Ask someone to hold it for you, if possible. Clearly mark and make sure that you can see: Any grab bars or handrails. First and last steps. Where the edge of each step is. Use tools that help you move around (mobility aids) if they are needed. These  include: Canes. Walkers. Scooters. Crutches. Turn on the lights when you go into a dark area. Replace any light bulbs as soon as they burn out. Set up your furniture so you have a clear path. Avoid moving your furniture around. If any of your floors are uneven, fix them. If there are any pets around you, be aware of where they are. Review your medicines with your doctor. Some medicines can make you feel dizzy. This can increase your chance of falling. Ask your doctor what other things that you can do to help prevent falls. This information is not intended to replace advice given to you by your health care provider. Make sure you discuss any questions you have with your health care provider. Document Released: 07/17/2009 Document Revised: 02/26/2016 Document Reviewed: 10/25/2014 Elsevier Interactive Patient Education  2017 [employer].

## 2021-12-10 NOTE — Progress Notes (Signed)
Subjective:   [name] [name] is a 71 y.o. female who presents for Medicare Annual (Subsequent) preventive examination.  I connected with Stevie Kern today by telephone and verified that I am speaking with the correct person using two identifiers. Location patient: home Location provider: work Persons participating in the virtual visit: patient, provider.   I discussed the limitations, risks, security and privacy concerns of performing an evaluation and management service by telephone and the availability of in person appointments. I also discussed with the patient that there may be a patient responsible charge related to this service. The patient expressed understanding and verbally consented to this telephonic visit.    Interactive audio and video telecommunications were attempted between this provider and patient, however failed, due to patient having technical difficulties OR patient did not have access to video capability.  We continued and completed visit with audio only.    Review of Systems     Cardiac Risk Factors include: advanced age (>82mn, >>75women);dyslipidemia     Objective:    Today's Vitals   There is no height or weight on file to calculate BMI.  Advanced Directives 12/10/2021 01/21/2021 01/12/2021 11/24/2020 05/28/2020 12/12/2019 11/15/2018  Does Patient Have a Medical Advance Directive? Yes Yes Yes Yes Yes Yes Yes  Type of [place]amed[place]of [place] will - Living will;Healthcare Power of [place] will [place] SouthLiving will [place] will Living will;Healthcare Power of Attorney  Does patient want to make changes to medical advance directive? - No - Patient declined No - Patient declined - - - -  Copy of [place]in Chart? No - copy requested - - No - copy requested - No - copy requested No - copy requested    Current Medications  (verified) Outpatient Encounter Medications as of 12/10/2021  Medication Sig   aspirin 325 MG tablet Take 325 mg by mouth daily.   B Complex-C (SUPER B COMPLEX PO) Take 1 tablet by mouth daily.    brimonidine (ALPHAGAN) 0.2 % ophthalmic solution 1 drop 3 (three) times daily.   cetirizine (ZYRTEC) 10 MG tablet Take 10 mg by mouth daily.   Cholecalciferol (VITAMIN D3) 5000 UNITS CAPS Take 5,000 Units by mouth daily.    Coenzyme Q10 (COQ10) 200 MG CAPS Take 1 capsule by mouth daily.    doxycycline (ADOXA) 50 MG tablet Take 50 mg by mouth daily. For rosacea   levothyroxine (EUTHYROX) 100 MCG tablet Take 1 tablet (100 mcg total) by mouth daily before breakfast.   Magnesium 200 MG TABS Take 200 mg by mouth in the morning and at bedtime.    Multiple Vitamins-Minerals (MULTIVITAMIN PO) Take 1 tablet by mouth daily.   NP THYROID 60 MG tablet Take 1 tablet (60 mg total) by mouth daily before breakfast.   omeprazole (PRILOSEC) 20 MG capsule Take 1 capsule (20 mg total) by mouth 2 (two) times daily before a meal.   simvastatin (ZOCOR) 20 MG tablet TAKE 1 TABLET (20 MG TOTAL) BY MOUTH AT BEDTIME.   SYSTANE BALANCE 0.6 % SOLN SMARTSIG:1 Drop(s) In Eye(s) PRN   Travoprost, BAK Free, (TRAVATAN) 0.004 % SOLN ophthalmic solution Place 1 drop into both eyes at bedtime.    No facility-administered encounter medications on file as of 12/10/2021.    Allergies (verified) Clindamycin/lincomycin, Flagyl [metronidazole hcl], Augmentin [amoxicillin-pot clavulanate], and Telithromycin   History: Past Medical History:  Diagnosis Date   Allergy, unspecified not elsewhere  classified    Borderline hypertension    Bronchitis    Cataract    DJD (degenerative joint disease)    GERD (gastroesophageal reflux disease)    Glaucoma    History of skin cancer    Hypercholesterolemia    Hypothyroidism    IBS (irritable bowel syndrome)    OSA (obstructive sleep apnea) 12/27/2017   no CPAP   Plantar fasciitis    PONV  (postoperative nausea and vomiting)    Postoperative nausea and vomiting 09/02/2015   Presence of shoulder implant 01/21/2021   Primary localized osteoarthritis of left knee    Skin cancer (melanoma) (HCC)    basil cell    Vitamin D deficiency    Past Surgical History:  Procedure Laterality Date   ANAL RECTAL MANOMETRY N/A 11/16/2019   Procedure: ANO RECTAL MANOMETRY;  Surgeon: Mauri Pole, MD;  Location: [facility];  Service: Endoscopy;  Laterality: N/A;   BUNIONECTOMY  2004   left   CATARACT EXTRACTION W/ INTRAOCULAR LENS  IMPLANT, BILATERAL     COLONOSCOPY     FOOT OSTEOTOMY  2003   right   HERNIA REPAIR  1957   JOINT REPLACEMENT     KNEE ARTHROSCOPY  1987&2003   left   MELANOMA EXCISION Right    shoulder   REVERSE SHOULDER ARTHROPLASTY Right 01/21/2021   Procedure: REVERSE SHOULDER ARTHROPLASTY;  Surgeon: Hiram Gash, MD;  Location: [place];  Service: Orthopedics;  Laterality: Right;   SHOULDER ARTHROSCOPY WITH OPEN ROTATOR CUFF REPAIR  2013   right   SKIN SURGERY  01/2004   Melanoma surg left shoulder   TOENAIL EXCISION  10/04/2017   TOTAL KNEE ARTHROPLASTY Left 09/01/2015   Procedure: TOTAL KNEE ARTHROPLASTY;  Surgeon: Elsie Saas, MD;  Location: [place];  Service: Orthopedics;  Laterality: Left;   TUBAL LIGATION  1990   Family History  Problem Relation Age of Onset   Colon polyps Father    Cancer Father        laryngeal   Heart disease Father    Diabetes Son    Osteoporosis Sister    Hypertension Brother    Heart disease Maternal Grandfather    Heart disease Paternal Grandfather    Colon cancer Neg Hx    Stomach cancer Neg Hx    Social History   Socioeconomic History   Marital status: Married    Spouse name: Marya Amsler [phone]   Number of children: 2   Years of education: Not on file   Highest education level: Not on file  Occupational History   Occupation: retired  Tobacco Use   Smoking status: Former    Years: 6.00     Types: Cigarettes    Quit date: 10/05/1975    Years since quitting: 46.2   Smokeless tobacco: Never  Vaping Use   Vaping Use: Never used  Substance and Sexual Activity   Alcohol use: Yes    Alcohol/week: 14.0 standard drinks    Types: 12 Standard drinks or equivalent, 2 Glasses of wine per week    Comment: socially   Drug use: No   Sexual activity: Not on file  Other Topics Concern   Not on file  Social History Narrative   Not on file   Social Determinants of Health   Financial Resource Strain: Low Risk    Difficulty of Paying Living Expenses: Not hard at all  Food Insecurity: No Food Insecurity   Worried About Charity fundraiser in the  Last Year: Never true   Ran Out of Food in the Last Year: Never true  Transportation Needs: No Transportation Needs   Lack of Transportation (Medical): No   Lack of Transportation (Non-Medical): No  Physical Activity: Insufficiently Active   Days of Exercise per Week: 3 days   Minutes of Exercise per Session: 30 min  Stress: No Stress Concern Present   Feeling of Stress : Not at all  Social Connections: Moderately Integrated   Frequency of Communication with Friends and Family: Twice a week   Frequency of Social Gatherings with Friends and Family: Twice a week   Attends Religious Services: Never   Marine scientist or Organizations: Yes   Attends Music therapist: More than 4 times per year   Marital Status: Married    Tobacco Counseling Counseling given: Not Answered   Clinical Intake:  Pre-visit preparation completed: Yes  Pain : No/denies pain     Nutritional Risks: None Diabetes: No  How often do you need to have someone help you when you read instructions, pamphlets, or other written materials from your doctor or pharmacy?: 1 - Never What is the last grade level you completed in school?: High School  Diabetic?no      Information entered by :: [place] of Daily Living In your  present state of health, do you have any difficulty performing the following activities: 12/10/2021 01/21/2021  Hearing? N N  Vision? N N  Difficulty concentrating or making decisions? N N  Walking or climbing stairs? N N  Dressing or bathing? N N  Doing errands, shopping? N -  Preparing Food and eating ? N -  Using the Toilet? N -  In the past six months, have you accidently leaked urine? N -  Do you have problems with loss of bowel control? N -  Managing your Medications? N -  Managing your Finances? N -  Housekeeping or managing your Housekeeping? N -  Some recent data might be hidden    Patient Care Team: Midge Minium, MD as PCP - General (Family Medicine) Berniece Salines, DO as PCP - Cardiology (Cardiology) Roque Cash., MD as Consulting Physician (Obstetrics and Gynecology) Susie Cassette, Katharine Look, MD as Referring Physician (Dermatology) Elsie Saas, MD as Consulting Physician (Orthopedic Surgery) Regal, Tamala Fothergill, DPM as Consulting Physician (Podiatry) Janann August, MD as Referring Physician (Dermatology) Darleen Crocker, MD as Consulting Physician (Ophthalmology) Madelin Rear, [facility] as Pharmacist (Pharmacist) Edythe Clarity, [facility] as Pharmacist (Pharmacist)  Indicate any recent Medical Services you may have received from other than [facility] providers in the past year (date may be approximate).     Assessment:   This is a routine wellness examination for Kathlene.  Hearing/Vision screen Vision Screening - Comments:: Annual eye exams   Dietary issues and exercise activities discussed: Current Exercise Habits: Home exercise routine, Type of exercise: walking, Time (Minutes): 30, Frequency (Times/Week): 3, Weekly Exercise (Minutes/Week): 90, Intensity: Mild, Exercise limited by: None identified   Goals Addressed   None    Depression Screen PHQ 2/9 Scores 12/10/2021 12/10/2021 11/20/2021 10/28/2021 05/20/2021 11/24/2020 05/21/2020  PHQ - 2 Score 0 0 0 0 0 0 0  PHQ- 9 Score  - - 0 0 0 - 0    Fall Risk Fall Risk  12/10/2021 11/20/2021 10/28/2021 05/20/2021 04/09/2021  Falls in the past year? 0 '1 1 1 '$ 0  Number falls in past yr: 0 0 0 0 -  Injury with Fall? 0  $'1 1 1 't$ -  Risk for fall due to : No Fall Risks History of fall(s) History of fall(s) History of fall(s) -  Follow up Falls evaluation completed Falls evaluation completed Falls evaluation completed Falls evaluation completed Falls evaluation completed    [place]:  Any stairs in or around the home? Yes  If so, are there any without handrails? No  Home free of loose throw rugs in walkways, pet beds, electrical cords, etc? Yes  Adequate lighting in your home to reduce risk of falls? Yes   ASSISTIVE DEVICES UTILIZED TO PREVENT FALLS:  Life alert? No  Use of a cane, walker or w/c? No  Grab bars in the bathroom? Yes  Shower chair or bench in shower? No  Elevated toilet seat or a handicapped toilet? No     Cognitive Function: Normal cognitive status assessed by direct observation by this Nurse Health Advisor. No abnormalities found.   MMSE - Mini Mental State Exam 11/15/2018 11/09/2017  Orientation to time 5 5  Orientation to Place 5 5  Registration 3 3  Attention/ Calculation 5 5  Recall 3 3  Language- name 2 objects 2 2  Language- repeat 1 1  Language- follow 3 step command 3 3  Language- read & follow direction 1 1  Write a sentence 1 1  Copy design 1 1  Total score 30 30        Immunizations Immunization History  Administered Date(s) Administered   Fluad Quad(high Dose 65+) 06/04/2019, 07/02/2021, 07/16/2021   Influenza Split 08/04/2012   Influenza, High Dose Seasonal PF 06/12/2018, 06/03/2020   Influenza, Seasonal, Injecte, Preservative Fre 06/04/2019   Influenza,inj,Quad PF,6+ Mos 11/22/2014, 07/01/2015, 06/04/2016, 07/25/2017   Influenza-Unspecified 07/03/2021   PFIZER(Purple Top)SARS-COV-2 Vaccination 10/26/2019, 11/13/2019, 06/19/2020, 02/11/2021    PNEUMOCOCCAL CONJUGATE-20 05/20/2021   Pfizer Covid-19 Vaccine Bivalent Booster 97yr & up 07/16/2021   Pneumococcal Conjugate-13 05/17/2018   Tdap 10/01/2011, 05/20/2021   Zoster Recombinat (Shingrix) 07/25/2017, 12/22/2017   Zoster, Live 12/03/2015    TDAP status: Up to date  Flu Vaccine status: Up to date  Pneumococcal vaccine status: Up to date  Covid-19 vaccine status: Completed vaccines  Qualifies for Shingles Vaccine? Yes   Zostavax completed Yes   Shingrix Completed?: Yes  Screening Tests Health Maintenance  Topic Date Due   MAMMOGRAM  08/14/2021   COLONOSCOPY (Pts 45-452yrInsurance coverage will need to be confirmed)  03/29/2022   TETANUS/TDAP  05/21/2031   Pneumonia Vaccine 6580Years old  Completed   INFLUENZA VACCINE  Completed   DEXA SCAN  Completed   COVID-19 Vaccine  Completed   Hepatitis C Screening  Completed   Zoster Vaccines- Shingrix  Completed   HPV VACCINES  Aged Out    Health Maintenance  Health Maintenance Due  Topic Date Due   MAMMOGRAM  08/14/2021    Colorectal cancer screening: Type of screening: Colonoscopy. Completed 03/29/2017. Repeat every 5 years  Mammogram status: Ordered 12/10/2021. Pt provided with contact info and advised to call to schedule appt.   Bone Density status: Completed 12/03/2020. Results reflect: Bone density results: OSTEOPENIA. Repeat every 5 years.  Lung Cancer Screening: (Low Dose CT Chest recommended if Age 70-80ears, 30 pack-year currently smoking OR have quit w/in 15years.) does not qualify.   Lung Cancer Screening Referral: n/a  Additional Screening:  Hepatitis C Screening: does not qualify; Completed 11/04/2016  Vision Screening: Recommended annual ophthalmology exams for early detection of glaucoma and other disorders  of the eye. Is the patient up to date with their annual eye exam?  Yes  Who is the provider or what is the name of the office in which the patient attends annual eye exams? Dr.Beavis  If  pt is not established with a provider, would they like to be referred to a provider to establish care? No .   Dental Screening: Recommended annual dental exams for proper oral hygiene  Community Resource Referral / Chronic Care Management: CRR required this visit?  No   CCM required this visit?  No      Plan:     I have personally reviewed and noted the following in the patients chart:   Medical and social history Use of alcohol, tobacco or illicit drugs  Current medications and supplements including opioid prescriptions.  Functional ability and status Nutritional status Physical activity Advanced directives List of other physicians Hospitalizations, surgeries, and ER visits in previous 12 months Vitals Screenings to include cognitive, depression, and falls Referrals and appointments  In addition, I have reviewed and discussed with patient certain preventive protocols, quality metrics, and best practice recommendations. A written personalized care plan for preventive services as well as general preventive health recommendations were provided to patient.     Randel Pigg, LPN   06/09/7590   Nurse Notes: none

## 2021-12-18 ENCOUNTER — Other Ambulatory Visit: Payer: Self-pay | Admitting: Family Medicine

## 2021-12-18 DIAGNOSIS — Z1231 Encounter for screening mammogram for malignant neoplasm of breast: Secondary | ICD-10-CM

## 2021-12-20 ENCOUNTER — Other Ambulatory Visit: Payer: Self-pay | Admitting: Family Medicine

## 2021-12-20 DIAGNOSIS — K219 Gastro-esophageal reflux disease without esophagitis: Secondary | ICD-10-CM

## 2022-01-01 DIAGNOSIS — E782 Mixed hyperlipidemia: Secondary | ICD-10-CM

## 2022-01-01 DIAGNOSIS — I2510 Atherosclerotic heart disease of native coronary artery without angina pectoris: Secondary | ICD-10-CM

## 2022-01-01 DIAGNOSIS — E038 Other specified hypothyroidism: Secondary | ICD-10-CM

## 2022-01-01 DIAGNOSIS — I2584 Coronary atherosclerosis due to calcified coronary lesion: Secondary | ICD-10-CM

## 2022-01-04 ENCOUNTER — Ambulatory Visit
Admission: RE | Admit: 2022-01-04 | Discharge: 2022-01-04 | Disposition: A | Discharge status: Home / Self Care | Payer: Medicare HMO | Source: Ambulatory Visit | Attending: Neurology | Admitting: Neurology

## 2022-01-04 DIAGNOSIS — M542 Cervicalgia: Secondary | ICD-10-CM | POA: Diagnosis not present

## 2022-01-04 DIAGNOSIS — M5412 Radiculopathy, cervical region: Secondary | ICD-10-CM | POA: Diagnosis not present

## 2022-01-04 DIAGNOSIS — G319 Degenerative disease of nervous system, unspecified: Secondary | ICD-10-CM | POA: Diagnosis not present

## 2022-01-04 DIAGNOSIS — I6782 Cerebral ischemia: Secondary | ICD-10-CM | POA: Diagnosis not present

## 2022-01-04 DIAGNOSIS — M47812 Spondylosis without myelopathy or radiculopathy, cervical region: Secondary | ICD-10-CM | POA: Diagnosis not present

## 2022-01-04 DIAGNOSIS — R4702 Dysphasia: Secondary | ICD-10-CM

## 2022-01-04 DIAGNOSIS — M2578 Osteophyte, vertebrae: Secondary | ICD-10-CM | POA: Diagnosis not present

## 2022-01-04 DIAGNOSIS — R1310 Dysphagia, unspecified: Secondary | ICD-10-CM | POA: Diagnosis not present

## 2022-01-05 ENCOUNTER — Ambulatory Visit: Payer: Medicare HMO | Admitting: Neurology

## 2022-01-05 DIAGNOSIS — M79601 Pain in right arm: Secondary | ICD-10-CM | POA: Diagnosis not present

## 2022-01-05 DIAGNOSIS — M79602 Pain in left arm: Secondary | ICD-10-CM | POA: Diagnosis not present

## 2022-01-05 DIAGNOSIS — M5412 Radiculopathy, cervical region: Secondary | ICD-10-CM

## 2022-01-05 NOTE — Procedures (Signed)
[facility]  ?[address], [address] ? [place], [place] [address] ?Tel: [phone] ?Fax:  [phone] ?Test Date:  01/05/2022 ? ?Patient: [name] [name] DOB: 21-Aug-1951 Physician: Narda Amber, DO  ?Sex: Female Height: '5\' 6"'$  Ref Phys: Wells Guiles Tat, D.O.  ?ID#: [id number]   Technician:   ? ?Patient Complaints: ?This is a 71 year old female with history of brachial plexitis referred for evaluation of muscle cramping. ? ?NCV & EMG Findings: ?Extensive electrodiagnostic testing of the right upper extremity and additional studies of the left shows: ?All sensory responses including bilateral median, ulnar, radial, mixed palmar, medial antebrachial, and lateral antebrachial cutaneous sensory responses are within normal limits. ?Bilateral median and ulnar motor responses are within normal limits. ?Chronic motor axonal loss changes are seen affecting the C7 and 5 myotomes bilaterally, without accompanied active denervation. ? ?Impression: ?Chronic C5 and C7 radiculopathies affecting bilateral upper extremities, moderate. ?There is no evidence of a brachial plexopathy or sensorimotor polyneuropathy affecting either upper extremity. ? ? ?___________________________ ?Narda Amber, DO ? ? ? ?Nerve Conduction Studies ?Anti Sensory Summary Table ? ? Stim Site NR Peak (ms) Norm Peak (ms) P-T Amp (?V) Norm P-T Amp  ?Left Lat Ante Brach Cutan Anti Sensory (Lat Forearm)  32?C  ?Lat Biceps    2.3 <2.8 10.9 >10  ?Right Lat Ante Brach Cutan Anti Sensory (Lat Forearm)  32?C  ?Lat Biceps    2.0 <2.8 13.3 >10  ?Left Med Ante Brach Cutan Anti Sensory (Med Forearm)  32?C  ?Elbow    2.4  9.2   ?Right Med Ante Brach Cutan Anti Sensory (Med Forearm)  32?C  ?Elbow    1.8  9.3   ?Left Median Anti Sensory (2nd Digit)  32?C  ?Wrist    3.0 <3.8 29.4 >10  ?Right Median Anti Sensory (2nd Digit)  32?C  ?Wrist    3.1 <3.8 21.9 >10  ?Left Radial Anti Sensory (Base 1st Digit)  32?C  ?Wrist    2.2 <2.8 26.1 >10  ?Right Radial Anti Sensory  (Base 1st Digit)  32?C  ?Wrist    2.2 <2.8 23.8 >10  ?Left Ulnar Anti Sensory (5th Digit)  32?C  ?Wrist    2.7 <3.2 23.9 >5  ?Right Ulnar Anti Sensory (5th Digit)  32?C  ?Wrist    2.3 <3.2 26.6 >5  ? ?Motor Summary Table ? ? Stim Site NR Onset (ms) Norm Onset (ms) O-P Amp (mV) Norm O-P Amp Site1 Site2 Delta-0 (ms) Dist (cm) Vel (m/s) Norm Vel (m/s)  ?Left Median Motor (Abd Poll Brev)  32?C  ?Wrist    2.6 <4.0 10.8 >5 Elbow Wrist 4.7 29.0 62 >50  ?Elbow    7.3  10.1         ?Right Median Motor (Abd Poll Brev)  32?C  ?Wrist    2.8 <4.0 10.4 >5 Elbow Wrist 4.6 29.0 63 >50  ?Elbow    7.4  9.4         ?Left Ulnar Motor (Abd Dig Minimi)  32?C  ?Wrist    2.1 <3.1 11.2 >7 B Elbow Wrist 3.5 22.0 63 >50  ?B Elbow    5.6  10.1  A Elbow B Elbow 1.7 10.0 59 >50  ?A Elbow    7.3  9.5         ?Right Ulnar Motor (Abd Dig Minimi)  32?C  ?Wrist    2.0 <3.1 10.8 >7 B Elbow Wrist 4.0 23.0 58 >50  ?B Elbow    6.0  9.6  A Elbow B Elbow 1.7 10.0 59 >50  ?A Elbow    7.7  9.1         ? ?Comparison Summary Table ? ? Stim Site NR Peak (ms) Norm Peak (ms) P-T Amp (?V) Site1 Site2 Delta-P (ms) Norm Delta (ms)  ?Left Median/Ulnar Palm Comparison (Wrist - 8cm)  32?C  ?Median Palm    1.8 <2.2 68.7 Median Palm Ulnar Palm 0.1   ?Ulnar Palm    1.7 <2.2 20.9      ?Right Median/Ulnar Palm Comparison (Wrist - 8cm)  32?C  ?Median Palm    1.8 <2.2 70.8 Median Palm Ulnar Palm 0.3   ?Ulnar Palm    1.5 <2.2 14.1      ? ?EMG ? ? Side Muscle Ins Act Fibs Psw Fasc Number Recrt Dur Dur. Amp Amp. Poly Poly. Comment  ?Right 1stDorInt Nml Nml Nml Nml Nml Nml Nml Nml Nml Nml Nml Nml N/A  ?Right PronatorTeres Nml Nml Nml Nml 2- Rapid Many 1+ Many 1+ Many 1+ N/A  ?Right Biceps Nml Nml Nml Nml Nml Nml Nml Nml Nml Nml Nml Nml N/A  ?Right Triceps Nml Nml Nml Nml 1- Rapid Some 1+ Some 1+ Some 1+ N/A  ?Right Deltoid Nml Nml Nml Nml 1- Rapid Some 1+ Some 1+ Some 1+ N/A  ?Right Infraspinatus Nml Nml Nml Nml 1- Rapid Some 1+ Some 1+ Some 1+ N/A  ?Left 1stDorInt Nml Nml Nml  Nml Nml Nml Nml Nml Nml Nml Nml Nml N/A  ?Left PronatorTeres Nml Nml Nml Nml 2- Rapid Many 1+ Many 1+ Many 1+ N/A  ?Left Biceps Nml Nml Nml Nml Nml Nml Nml Nml Nml Nml Nml Nml N/A  ?Left Triceps Nml Nml Nml Nml 1- Rapid Some 1+ Some 1+ Some 1+ N/A  ?Left Deltoid Nml Nml Nml Nml 1- Rapid Some 1+ Some 1+ Some 1+ N/A  ?Left Infraspinatus Nml Nml Nml Nml 1- Rapid Some 1+ Some 1+ Some 1+ N/A  ? ? ? ? ?Waveforms: ?    ? ?    ? ?    ? ?    ? ?    ? ?  ? ? ?

## 2022-01-06 ENCOUNTER — Ambulatory Visit
Admission: RE | Admit: 2022-01-06 | Discharge: 2022-01-06 | Disposition: A | Discharge status: Home / Self Care | Payer: Medicare HMO | Source: Ambulatory Visit | Attending: Family Medicine | Admitting: Family Medicine

## 2022-01-06 DIAGNOSIS — Z1231 Encounter for screening mammogram for malignant neoplasm of breast: Secondary | ICD-10-CM | POA: Diagnosis not present

## 2022-01-06 LAB — HM MAMMOGRAPHY

## 2022-01-06 NOTE — Progress Notes (Signed)
? ?Assessment/Plan:  ? ?Dysphasia ? -Neuro exam is nonfocal and nonlateralizing and very similar to when I saw her 2 years ago. ? -MRI brain nonrevealing. ? -Neurocognitive testing pending, but not scheduled for quite some time. ? ?2.  Neck pain with hand cramping ? 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  Patient with EMG demonstrating chronic C5 and C7 radiculopathies. ? -We will refer patient back to neurosurgery.  Discussed options such as surgical intervention, physical therapy, steroid injections.  They will get a neurosurgery opinion.  They have seen Dr. Ronnald Ramp in the past. ? ?3.  Follow-up will depend upon the results of the above testing.  I suspect her neurocognitive testing will look okay, but we did discuss it is possible she may have MCI. ? ? ? ?Subjective:  ? ?[name] [name] was seen back in follow-up for testing results.  Last visit, she was complaining about neck pain and hand cramping.  She previously related that to what she refers to as her history of brachial plexitis.  However, her symptoms really never sounded like brachial plexitis (she had no sensory symptoms) and she never saw neurology.  She did have an EMG and an MRI of the cervical spine done since our last visit.  MRI cervical spine with multilevel neuroforaminal stenosis, severe at C3-C4 on the right and moderate to severe at that same level on the left.  She had neuroforaminal stenosis that was moderate at C4-C5 on the right and severe at that same level on the left.  Similarly, she had bilateral, severe, neuroforaminal stenosis at C5-C6 and on the left at C6-C7.  EMG was done and demonstrated chronic C5 and C7 radiculopathies.  There was no evidence of a brachial plexopathy.  Her MRI brain demonstrated mild small vessel disease.  There was mild to moderate atrophy. ? ? ? ?CURRENT MEDICATIONS:  ?Outpatient Encounter Medications as of 01/11/2022  ?Medication Sig  ? aspirin 325 MG tablet Take 325 mg by  mouth daily.  ? B Complex-C (SUPER B COMPLEX PO) Take 1 tablet by mouth daily.   ? brimonidine (ALPHAGAN) 0.2 % ophthalmic solution 1 drop 3 (three) times daily.  ? cetirizine (ZYRTEC) 10 MG tablet Take 10 mg by mouth daily.  ? Cholecalciferol (VITAMIN D3) 5000 UNITS CAPS Take 5,000 Units by mouth daily.   ? Coenzyme Q10 (COQ10) 200 MG CAPS Take 1 capsule by mouth daily.   ? doxycycline (ADOXA) 50 MG tablet Take 50 mg by mouth daily. For rosacea  ? Magnesium 200 MG TABS Take 200 mg by mouth in the morning and at bedtime.   ? Multiple Vitamins-Minerals (MULTIVITAMIN PO) Take 1 tablet by mouth daily.  ? NP THYROID 60 MG tablet Take 1 tablet (60 mg total) by mouth daily before breakfast.  ? omeprazole (PRILOSEC) 20 MG capsule TAKE 1 CAPSULE (20 MG TOTAL) BY MOUTH DAILY.  ? simvastatin (ZOCOR) 20 MG tablet TAKE 1 TABLET (20 MG TOTAL) BY MOUTH AT BEDTIME.  ? SYSTANE BALANCE 0.6 % SOLN SMARTSIG:1 Drop(s) In Eye(s) PRN  ? Travoprost, BAK Free, (TRAVATAN) 0.004 % SOLN ophthalmic solution Place 1 drop into both eyes at bedtime.   ? [DISCONTINUED] levothyroxine (EUTHYROX) 100 MCG tablet Take 1 tablet (100 mcg total) by mouth daily before breakfast.  ? ?No facility-administered encounter medications on file as of 01/11/2022.  ? ? ? ?Objective:  ? ?PHYSICAL EXAMINATION:   ? ?VITALS:   ?Vitals:  ? 01/11/22 1432  ?BP: 119/71  ?Pulse: 77  ?  SpO2: 98%  ?Weight: 197 lb (89.4 kg)  ?Height: '5\' 5"'$  (1.651 m)  ? ?100% of visit in counseling.  Speech was fluent and clear throughout the visit. ? ?I have reviewed and interpreted the following labs independently ?Lab Results  ?Component Value Date  ? [id number] 639 05/28/2020  ? ?  Chemistry   ?   ?Component Value Date/Time  ? NA 138 11/20/2021 0911  ? NA 139 03/18/2015 0000  ? K 4.0 11/20/2021 0911  ? CL 104 11/20/2021 0911  ? CO2 29 11/20/2021 0911  ? BUN 14 11/20/2021 0911  ? BUN 19 03/18/2015 0000  ? CREATININE 0.78 11/20/2021 0911  ? CREATININE 0.74 02/18/2017 1552  ? GLU 93 03/18/2015  0000  ?    ?Component Value Date/Time  ? CALCIUM 9.6 11/20/2021 0911  ? ALKPHOS 98 11/20/2021 0911  ? AST 23 11/20/2021 0911  ? ALT 22 11/20/2021 0911  ? BILITOT 0.7 11/20/2021 0911  ?  ? ?Lab Results  ?Component Value Date  ? TSH 2.19 11/20/2021  ? ? ? ? ?Total time spent on today's visit was 22 minutes, including both face-to-face time and nonface-to-face time.  Time included that spent on review of records (prior notes available to me/labs/imaging if pertinent), discussing treatment and goals, answering patient's questions and coordinating care. ? ?Cc:  Midge Minium, MD ? ?

## 2022-01-11 ENCOUNTER — Ambulatory Visit: Payer: Medicare HMO | Admitting: Neurology

## 2022-01-11 ENCOUNTER — Encounter: Payer: Self-pay | Admitting: Neurology

## 2022-01-11 ENCOUNTER — Other Ambulatory Visit: Payer: Self-pay

## 2022-01-11 VITALS — BP 119/71 | HR 77 | Ht 65.0 in | Wt 197.0 lb

## 2022-01-11 DIAGNOSIS — M5412 Radiculopathy, cervical region: Secondary | ICD-10-CM | POA: Diagnosis not present

## 2022-01-11 NOTE — Patient Instructions (Signed)
We will send a referral to Dr. Ronnald Ramp office.  If you don't hear from them to schedule appt in the next 1-2 weeks, let us know. ? ?The physicians and staff at [facility] are committed to providing excellent care. You may receive a survey requesting feedback about your experience at our office. We strive to receive "very good" responses to the survey questions. If you feel that your experience would prevent you from giving the office a "very good " response, please contact our office to try to remedy the situation. We may be reached at [phone]. Thank you for taking the time out of your busy day to complete the survey. ? ?

## 2022-01-19 IMAGING — CR DG SHOULDER 2+V PORT*R*
1 series · 1 of 1 positions shown · non-contrast
Comparison: Chest x-ray 08/21/2015.

CLINICAL DATA: Postoperative evaluation.

EXAM:
PORTABLE RIGHT SHOULDER

[shoulder ap]
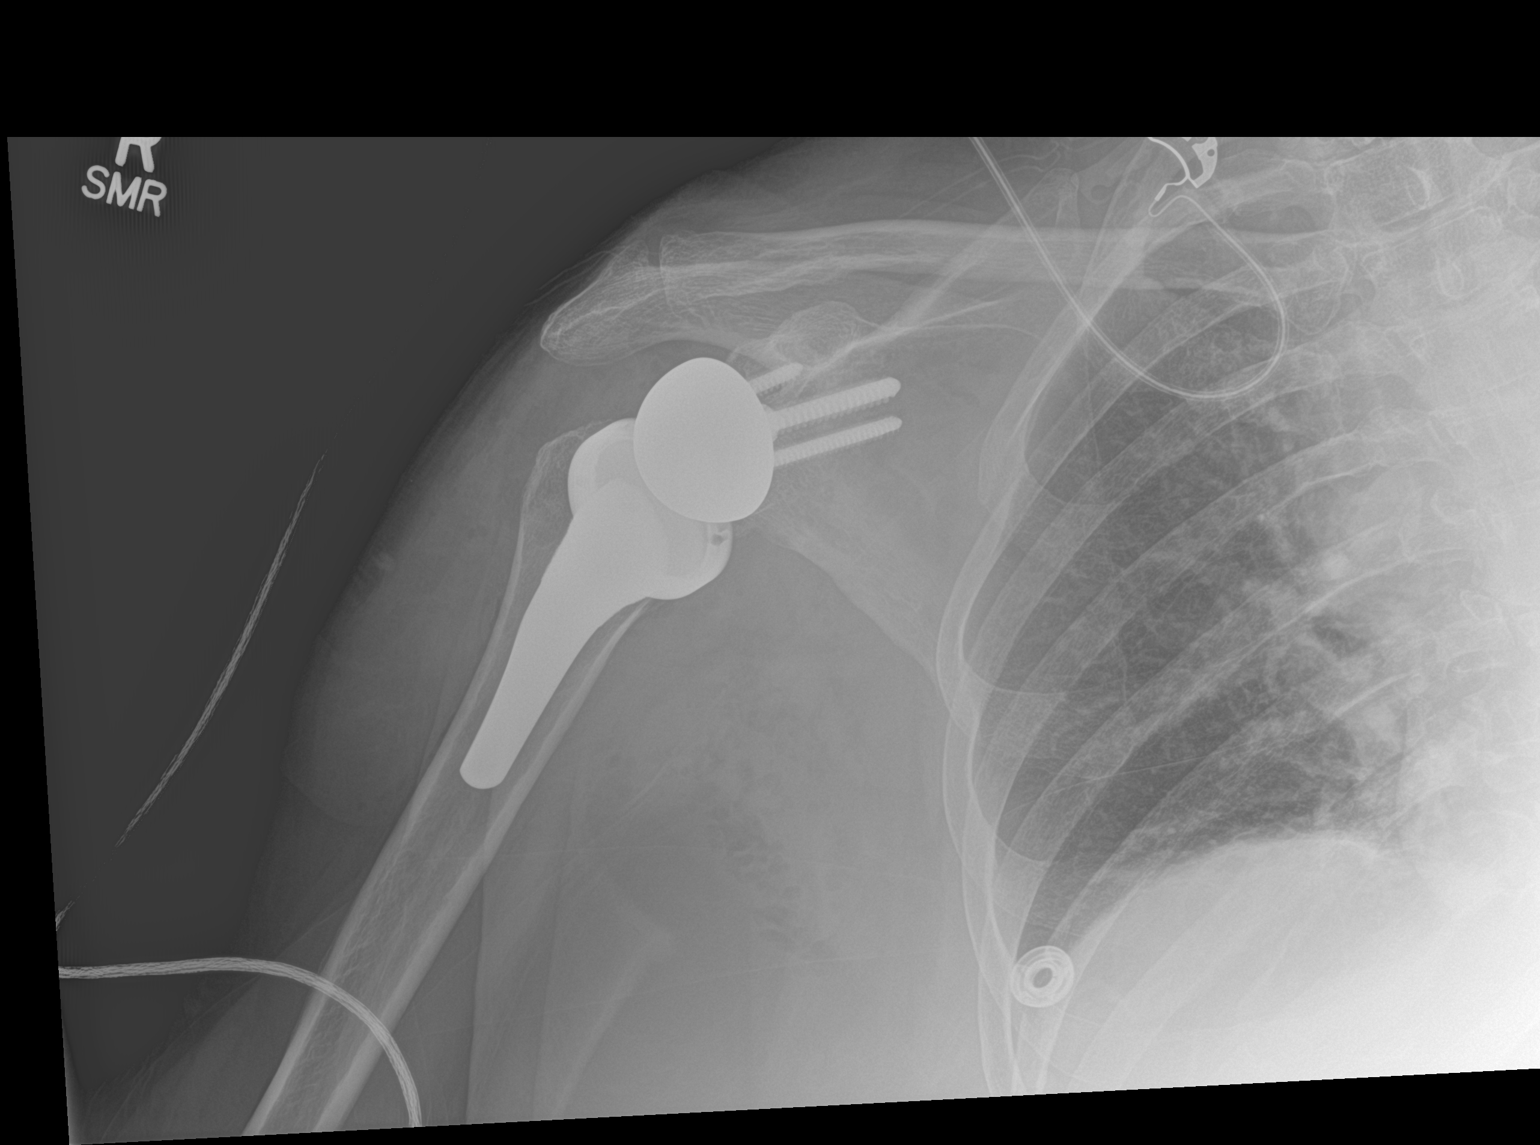

[1 of 1 positions shown; findings below may reference images not displayed]

FINDINGS: Total right shoulder replacement. Hardware intact. Anatomic
alignment.
IMPRESSION: Total right shoulder replacement anatomic alignment.

## 2022-01-20 ENCOUNTER — Other Ambulatory Visit: Payer: Self-pay | Admitting: Family Medicine

## 2022-01-20 DIAGNOSIS — Z1211 Encounter for screening for malignant neoplasm of colon: Secondary | ICD-10-CM

## 2022-01-20 NOTE — Progress Notes (Signed)
Dx for GI referral updated ?

## 2022-01-21 ENCOUNTER — Encounter: Payer: Self-pay | Admitting: Gastroenterology

## 2022-02-08 ENCOUNTER — Other Ambulatory Visit: Payer: Self-pay | Admitting: Family Medicine

## 2022-02-10 DIAGNOSIS — L820 Inflamed seborrheic keratosis: Secondary | ICD-10-CM | POA: Diagnosis not present

## 2022-02-10 DIAGNOSIS — D485 Neoplasm of uncertain behavior of skin: Secondary | ICD-10-CM | POA: Diagnosis not present

## 2022-02-25 DIAGNOSIS — M47812 Spondylosis without myelopathy or radiculopathy, cervical region: Secondary | ICD-10-CM | POA: Diagnosis not present

## 2022-03-15 ENCOUNTER — Ambulatory Visit (AMBULATORY_SURGERY_CENTER): Payer: Self-pay | Admitting: *Deleted

## 2022-03-15 VITALS — Ht 65.0 in | Wt 197.2 lb

## 2022-03-15 DIAGNOSIS — Z860100 Personal history of colon polyps, unspecified: Secondary | ICD-10-CM

## 2022-03-15 MED ORDER — NA SULFATE-K SULFATE-MG SULF 17.5-3.13-1.6 GM/177ML PO SOLN: 1.0000 | Freq: Once | ORAL | 0 refills | Status: AC

## 2022-03-15 NOTE — Progress Notes (Signed)
No egg or soy allergy known to patient  No issues known to pt with past sedation with any surgeries or procedures Patient denies ever being told they had issues or difficulty with intubation  No FH of Malignant Hyperthermia Pt is not on diet pills Pt is not on  home 02  Pt is not on blood thinners  Pt denies issues with constipation  No A fib or A flutter  SUPREP Coupon to pt in PV today , Code to Pharmacy and  NO PA's for preps discussed with pt In PV today  Discussed with pt there will be an out-of-pocket cost for prep and that varies from $0 to 70 +  dollars - pt verbalized understanding  Pt instructed to use Single[employer].com or GoodRx for a price reduction on prep   PV completed over the phone. Pt verified name, DOB, address and insurance during PV today.  Pt mailed instruction packet with copy of consent form to read and not return, and instructions.  Pt encouraged to call with questions or issues.  If pt has My chart, procedure instructions sent via My Chart  Insurance confirmed with pt at [facility] today    [place].

## 2022-03-16 DIAGNOSIS — M5412 Radiculopathy, cervical region: Secondary | ICD-10-CM | POA: Diagnosis not present

## 2022-03-17 DIAGNOSIS — L570 Actinic keratosis: Secondary | ICD-10-CM | POA: Diagnosis not present

## 2022-03-17 DIAGNOSIS — Z85828 Personal history of other malignant neoplasm of skin: Secondary | ICD-10-CM | POA: Diagnosis not present

## 2022-03-17 DIAGNOSIS — D225 Melanocytic nevi of trunk: Secondary | ICD-10-CM | POA: Diagnosis not present

## 2022-03-17 DIAGNOSIS — Z129 Encounter for screening for malignant neoplasm, site unspecified: Secondary | ICD-10-CM | POA: Diagnosis not present

## 2022-03-17 DIAGNOSIS — Z86008 Personal history of in-situ neoplasm of other site: Secondary | ICD-10-CM | POA: Diagnosis not present

## 2022-03-17 DIAGNOSIS — Z85820 Personal history of malignant melanoma of skin: Secondary | ICD-10-CM | POA: Diagnosis not present

## 2022-03-17 DIAGNOSIS — L718 Other rosacea: Secondary | ICD-10-CM | POA: Diagnosis not present

## 2022-03-17 DIAGNOSIS — L578 Other skin changes due to chronic exposure to nonionizing radiation: Secondary | ICD-10-CM | POA: Diagnosis not present

## 2022-03-25 ENCOUNTER — Encounter: Payer: Self-pay | Admitting: Gastroenterology

## 2022-03-30 ENCOUNTER — Encounter: Payer: Self-pay | Admitting: Gastroenterology

## 2022-03-30 ENCOUNTER — Ambulatory Visit (AMBULATORY_SURGERY_CENTER): Payer: Medicare HMO | Admitting: Gastroenterology

## 2022-03-30 VITALS — BP 140/80 | HR 61 | Temp 97.3°F | Resp 12 | Ht 65.0 in | Wt 192.2 lb

## 2022-03-30 DIAGNOSIS — K5730 Diverticulosis of large intestine without perforation or abscess without bleeding: Secondary | ICD-10-CM

## 2022-03-30 DIAGNOSIS — Z86010 Personal history of colonic polyps: Secondary | ICD-10-CM | POA: Diagnosis not present

## 2022-03-30 DIAGNOSIS — Z09 Encounter for follow-up examination after completed treatment for conditions other than malignant neoplasm: Secondary | ICD-10-CM

## 2022-03-30 DIAGNOSIS — D122 Benign neoplasm of ascending colon: Secondary | ICD-10-CM

## 2022-03-30 MED ORDER — SODIUM CHLORIDE 0.9 % IV SOLN: 500.0000 mL | Freq: Once | INTRAVENOUS | Status: AC

## 2022-03-30 NOTE — Progress Notes (Signed)
Called to room to assist during endoscopic procedure.  Patient ID and intended procedure confirmed with present staff. Received instructions for my participation in the procedure from the performing physician.  

## 2022-03-31 ENCOUNTER — Telehealth: Payer: Self-pay | Admitting: *Deleted

## 2022-03-31 NOTE — Telephone Encounter (Signed)
  Follow up Call-     03/30/2022    8:31 AM  Call back number  Post procedure Call Back phone  # [phone]  Permission to leave phone message Yes     Patient questions:  Do you have a fever, pain , or abdominal swelling? No. Pain Score  0 *  Have you tolerated food without any problems? Yes.    Have you been able to return to your normal activities? Yes.    Do you have any questions about your discharge instructions: Diet   No. Medications  No. Follow up visit  No.  Do you have questions or concerns about your Care? No.  Actions: * If pain score is 4 or above: No action needed, pain <4.

## 2022-04-11 ENCOUNTER — Encounter: Payer: Self-pay | Admitting: Gastroenterology

## 2022-04-27 DIAGNOSIS — M5412 Radiculopathy, cervical region: Secondary | ICD-10-CM | POA: Diagnosis not present

## 2022-04-27 DIAGNOSIS — H43813 Vitreous degeneration, bilateral: Secondary | ICD-10-CM | POA: Diagnosis not present

## 2022-04-27 DIAGNOSIS — H18413 Arcus senilis, bilateral: Secondary | ICD-10-CM | POA: Diagnosis not present

## 2022-04-27 DIAGNOSIS — Z6832 Body mass index (BMI) 32.0-32.9, adult: Secondary | ICD-10-CM | POA: Diagnosis not present

## 2022-04-27 DIAGNOSIS — H401132 Primary open-angle glaucoma, bilateral, moderate stage: Secondary | ICD-10-CM | POA: Diagnosis not present

## 2022-04-27 DIAGNOSIS — Z961 Presence of intraocular lens: Secondary | ICD-10-CM | POA: Diagnosis not present

## 2022-04-28 ENCOUNTER — Other Ambulatory Visit: Payer: Self-pay

## 2022-04-28 MED ORDER — NP THYROID 60 MG PO TABS
60.0000 mg | ORAL_TABLET | Freq: Every day | ORAL | 1 refills | Status: DC
Start: 2022-04-28 — End: 2022-08-17

## 2022-05-16 ENCOUNTER — Other Ambulatory Visit: Payer: Self-pay | Admitting: Family Medicine

## 2022-05-16 DIAGNOSIS — K219 Gastro-esophageal reflux disease without esophagitis: Secondary | ICD-10-CM

## 2022-05-26 ENCOUNTER — Other Ambulatory Visit: Payer: Self-pay | Admitting: Family Medicine

## 2022-06-10 DIAGNOSIS — Z85820 Personal history of malignant melanoma of skin: Secondary | ICD-10-CM | POA: Diagnosis not present

## 2022-06-10 DIAGNOSIS — I2510 Atherosclerotic heart disease of native coronary artery without angina pectoris: Secondary | ICD-10-CM | POA: Diagnosis not present

## 2022-06-10 DIAGNOSIS — I700 Atherosclerosis of aorta: Secondary | ICD-10-CM | POA: Diagnosis not present

## 2022-06-10 DIAGNOSIS — R918 Other nonspecific abnormal finding of lung field: Secondary | ICD-10-CM | POA: Diagnosis not present

## 2022-06-14 DIAGNOSIS — R918 Other nonspecific abnormal finding of lung field: Secondary | ICD-10-CM | POA: Diagnosis not present

## 2022-06-14 DIAGNOSIS — C434 Malignant melanoma of scalp and neck: Secondary | ICD-10-CM | POA: Diagnosis not present

## 2022-06-16 NOTE — Progress Notes (Deleted)
Chronic Care Management Pharmacy Note  06/16/2022 Name:  [name] [name] MRN:  [id number] DOB:  [birth year]  Summary: Initial visit with PharmD.  All meds reviewed, her main concern today was the muscle cramping she has been having for quite some time.  She is going to see a neurologist but they cannot do testing until October.  The cramps have now moved into her neck.  Recommendations/Changes made from today's visit: Consider checking CK for rhabdo or switch to Crestor to see if this improves any of her cramping symptoms  Plan: FU 6 months   Subjective: [name] [name] is an 71 y.o. year old female who is a primary patient of Tabori, Aundra Millet, MD.  The CCM team was consulted for assistance with disease management and care coordination needs.    Engaged with patient by telephone for initial visit in response to provider referral for pharmacy case management and/or care coordination services.   Consent to Services:  The patient was given the following information about Chronic Care Management services today, agreed to services, and gave verbal consent: 1. CCM service includes personalized support from designated clinical staff supervised by the primary care provider, including individualized plan of care and coordination with other care providers 2. 24/7 contact phone numbers for assistance for urgent and routine care needs. 3. Service will only be billed when office clinical staff spend 20 minutes or more in a month to coordinate care. 4. Only one practitioner may furnish and bill the service in a calendar month. 5.The patient may stop CCM services at any time (effective at the end of the month) by phone call to the office staff. 6. The patient will be responsible for cost sharing (co-pay) of up to 20% of the service fee (after annual deductible is met). Patient agreed to services and consent obtained.  Patient Care Team: Midge Minium, MD as PCP - General (Family Medicine) Berniece Salines, DO as PCP - Cardiology (Cardiology) Roque Cash., MD as Consulting Physician (Obstetrics and Gynecology) Susie Cassette, Katharine Look, MD as Referring Physician (Dermatology) Elsie Saas, MD as Consulting Physician (Orthopedic Surgery) Regal, Tamala Fothergill, DPM as Consulting Physician (Podiatry) Janann August, MD as Referring Physician (Dermatology) Darleen Crocker, MD as Consulting Physician (Ophthalmology) Madelin Rear, [facility] (Inactive) as Pharmacist (Pharmacist) Edythe Clarity, [facility] as Pharmacist (Pharmacist) Recent office visits:  11/20/21 Annye Asa, MD - Family Medicine- Mixed Hyperlipidemia - Labs were ordered. Referral to Neurology placed - No medication changes. Follow up in 6 months.   10/28/21 Nedra Hai, NP - Sinusitis - azelastine (ASTELIN) 0.1 % nasal spray and azithromycin (ZITHROMAX) 250 MG tablet were prescribed.    Recent consult visits:  08/05/21 Fredricka Bonine, MD - ENT - Malignant Melanoma of neck - Follow up in 6 months for surveillance 07/30/21 Physical Therapy - right shoulder pain   07/17/21  Physical Therapy - right shoulder pain   10/13/2 [place] - no notes available   07/15/21 Hollar, Clement Husbands - Dermatology - Malignant melanoma of skin - No notes available.    07/01/21 Hollar, [place] - Dermatology - Maligant melanoma of skin - No notes available.    06/29/21  Physical Therapy - right shoulder pain   06/24/21  Physical Therapy - right shoulder pain   06/11/21  Physical Therapy - right shoulder pain     Hospital visits:  None in previous 6 months   Objective:  Lab Results  Component Value Date   CREATININE 0.78 11/20/2021  BUN 14 11/20/2021   GFR 77.19 11/20/2021   GFRNONAA >60 01/05/2017   GFRAA >60 01/05/2017   NA 138 11/20/2021   K 4.0 11/20/2021   CALCIUM 9.6 11/20/2021   CO2 29 11/20/2021   GLUCOSE 96 11/20/2021    Lab Results  Component Value Date/Time   HGBA1C 5.5 03/18/2015 12:00 AM   GFR 77.19  11/20/2021 09:11 AM   GFR 77.47 05/20/2021 10:32 AM    Last diabetic Eye exam: No results found for: "HMDIABEYEEXA"  Last diabetic Foot exam: No results found for: "HMDIABFOOTEX"   Lab Results  Component Value Date   CHOL 171 11/20/2021   HDL 73.10 11/20/2021   LDLCALC 80 11/20/2021   TRIG 92.0 11/20/2021   CHOLHDL 2 11/20/2021       Latest Ref Rng & Units 11/20/2021    9:11 AM 05/20/2021   10:32 AM 11/20/2020    8:42 AM  Hepatic Function  Total Protein 6.0 - 8.3 g/dL 7.0  7.3  7.1   Albumin 3.5 - 5.2 g/dL 4.5  4.4  4.2   AST 0 - 37 U/L '23  23  24   ' ALT 0 - 35 U/L '22  22  26   ' Alk Phosphatase 39 - 117 U/L 98  97  80   Total Bilirubin 0.2 - 1.2 mg/dL 0.7  0.7  0.7   Bilirubin, Direct 0.0 - 0.3 mg/dL 0.2   0.1     Lab Results  Component Value Date/Time   TSH 2.19 11/20/2021 09:11 AM   TSH 1.42 05/20/2021 10:32 AM   FREET4 1.1 02/18/2017 03:52 PM       Latest Ref Rng & Units 11/20/2021    9:11 AM 05/20/2021   10:32 AM 11/20/2020    8:42 AM  CBC  WBC 4.0 - 10.5 K/uL 5.1  5.9  7.7   Hemoglobin 12.0 - 15.0 g/dL 13.2  13.3  12.9   Hematocrit 36.0 - 46.0 % 40.5  41.1  38.6   Platelets 150.0 - 400.0 K/uL 214.0  241.0  339.0     Lab Results  Component Value Date/Time   VD25OH 64.73 11/20/2021 09:11 AM   VD25OH 52.00 11/20/2020 08:42 AM    Clinical ASCVD: No  The 10-year ASCVD risk score (Arnett DK, et al., 2019) is: 10.1%   Values used to calculate the score:     Age: 22 years     Sex: Female     Is Non-Hispanic African American: No     Diabetic: No     Tobacco smoker: No     Systolic Blood Pressure: 376 mmHg     Is BP treated: No     HDL Cholesterol: 73.1 mg/dL     Total Cholesterol: 171 mg/dL       12/10/2021    1:18 PM 12/10/2021    1:16 PM 11/20/2021    8:33 AM  Depression screen PHQ 2/9  Decreased Interest 0 0 0  Down, Depressed, Hopeless 0 0 0  PHQ - 2 Score 0 0 0  Altered sleeping   0  Tired, decreased energy   0  Change in appetite   0  Feeling bad  or failure about yourself    0  Trouble concentrating   0  Moving slowly or fidgety/restless   0  Suicidal thoughts   0  PHQ-9 Score   0  Difficult doing work/chores   Not difficult at all     Social History   Tobacco  Use  Smoking Status Former   Years: 6.00   Types: Cigarettes   Quit date: 10/05/1975   Years since quitting: 46.7   Passive exposure: Past  Smokeless Tobacco Never   BP Readings from Last 3 Encounters:  03/30/22 140/80  01/11/22 119/71  12/08/21 131/82   Pulse Readings from Last 3 Encounters:  03/30/22 61  01/11/22 77  12/08/21 70   Wt Readings from Last 3 Encounters:  03/30/22 192 lb 3.2 oz (87.2 kg)  03/15/22 197 lb 3.2 oz (89.4 kg)  01/11/22 197 lb (89.4 kg)   BMI Readings from Last 3 Encounters:  03/30/22 31.98 kg/m  03/15/22 32.82 kg/m  01/11/22 32.78 kg/m    Assessment/Interventions: Review of patient past medical history, allergies, medications, health status, including review of consultants reports, laboratory and other test data, was performed as part of comprehensive evaluation and provision of chronic care management services.   SDOH:  (Social Determinants of Health) assessments and interventions performed: Yes SDOH Interventions    Flowsheet Row Clinical Support from 12/10/2021 in [facility]  SDOH Interventions   Food Insecurity Interventions Intervention Not Indicated  Housing Interventions Intervention Not Indicated  Transportation Interventions Intervention Not Indicated  Financial Strain Interventions Intervention Not Indicated  Physical Activity Interventions Intervention Not Indicated  Stress Interventions Intervention Not Indicated  Social Connections Interventions Intervention Not Indicated      Financial Resource Strain: Low Risk  (12/10/2021)   Overall Financial Resource Strain (CARDIA)    Difficulty of Paying Living Expenses: Not hard at all   Food Insecurity: No Food Insecurity  (12/10/2021)   Hunger Vital Sign    Worried About Running Out of Food in the Last Year: Never true    Ran Out of Food in the Last Year: Never true    SDOH Screenings   Food Insecurity: No Food Insecurity (12/10/2021)  Housing: Low Risk  (12/10/2021)  Transportation Needs: No Transportation Needs (12/10/2021)  Alcohol Screen: Low Risk  (12/10/2021)  Depression (PHQ2-9): Low Risk  (12/10/2021)  Financial Resource Strain: Low Risk  (12/10/2021)  Physical Activity: Insufficiently Active (12/10/2021)  Social Connections: Moderately Integrated (12/10/2021)  Stress: No Stress Concern Present (12/10/2021)  Tobacco Use: Medium Risk (03/30/2022)    CCM Care Plan  Allergies  Allergen Reactions   Clindamycin/Lincomycin Diarrhea   Flagyl [Metronidazole Hcl] Diarrhea    Stomach upset. Pt states she CANNOT TAKE oral, has no problem with topical.    Augmentin [Amoxicillin-Pot Clavulanate] Diarrhea   Telithromycin Diarrhea    Medications Reviewed Today     Reviewed by Ladene Artist, MD (Physician) on 03/30/22 at [phone]  Med List Status: <None>   Medication Order Taking? Sig Documenting Provider Last Dose Status Informant  aspirin 325 MG tablet [id number] Yes Take 325 mg by mouth daily. [provider] Past Week Active Self           Med Note Laurance Flatten, PATINA S   Mon Jan 09, 2018  1:13 PM)    B Complex-C (SUPER B COMPLEX PO) [id number] Yes Take 1 tablet by mouth daily.  [provider] 03/29/2022 Active   brimonidine (ALPHAGAN) 0.2 % ophthalmic solution [id number] Yes 1 drop 3 (three) times daily. [provider] 03/29/2022 Active   cetirizine (ZYRTEC) 10 MG tablet [id number] Yes Take 10 mg by mouth daily. [provider] 03/29/2022 Active   Cholecalciferol (VITAMIN D3) 5000 UNITS CAPS [id number] Yes Take 5,000 Units by mouth daily.  [provider] 03/29/2022 Active Self  Coenzyme  Q10 (COQ10) 200 MG CAPS [id number] Yes Take 1 capsule by mouth daily.  [provider]  03/29/2022 Active   doxycycline (ADOXA) 50 MG tablet [id number] Yes Take 50 mg by mouth daily. For rosacea [provider] 03/29/2022 Active Self  Magnesium 200 MG TABS [id number] Yes Take 200 mg by mouth in the morning and at bedtime.  [provider] 03/29/2022 Active Self           Med Note Annamaria Boots, TIERICA T   Wed May 28, 2020 10:35 AM)    Multiple Vitamins-Minerals (MULTIVITAMIN PO) [id number] Yes Take 1 tablet by mouth daily. [provider] 03/29/2022 Active Self  NP THYROID 60 MG tablet [id number] Yes TAKE 1 TABLET BY MOUTH EVERY DAY BEFORE BREAKFAST Midge Minium, MD 03/30/2022 Active   omeprazole (PRILOSEC) 20 MG capsule [id number] Yes TAKE 1 CAPSULE (20 MG TOTAL) BY MOUTH DAILY. Midge Minium, MD 03/29/2022 Active   simvastatin (ZOCOR) 20 MG tablet [id number] Yes TAKE 1 TABLET (20 MG TOTAL) BY MOUTH AT BEDTIME. Midge Minium, MD 03/29/2022 Active   SYSTANE BALANCE 0.6 % SOLN [id number] Yes SMARTSIG:1 Drop(s) In Eye(s) PRN [provider] 03/29/2022 Active   tiZANidine (ZANAFLEX) 2 MG tablet [id number] No Take 2 mg by mouth every 8 (eight) hours as needed. [provider] Unknown Active   Travoprost, BAK Free, (TRAVATAN) 0.004 % SOLN ophthalmic solution [id number] Yes Place 1 drop into both eyes at bedtime.  [provider] 03/29/2022 Active             Patient Active Problem List   Diagnosis Date Noted   Precordial pain 04/09/2021   DOE (dyspnea on exertion) 04/09/2021   Coronary artery calcification seen on CAT scan 04/09/2021   Mixed hyperlipidemia 04/09/2021   Cataract 03/16/2021   Glaucoma 03/16/2021   Skin cancer (melanoma) ([place]) 03/16/2021   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/21/2021   Malignant melanoma of neck ([place]) 10/09/2020   Tremor of both hands 05/21/2020   Incontinence of feces    Bloating 08/22/2019   Fecal soiling 08/22/2019   Contact with and (suspected) exposure to other viral communicable  diseases 07/03/2019   Diarrhea 06/30/2019   Headache 06/30/2019   Obesity (BMI 30-39.9) 11/15/2018   Anxiety about health 05/17/2018   Hx of adenomatous colonic polyps 01/17/2017   Postoperative nausea and vomiting 09/02/2015   Primary localized osteoarthritis of left knee    IBS (irritable bowel syndrome)    Physical exam 05/30/2015   Hypothyroidism 11/22/2014   Vitamin D deficiency 09/21/2009   SKIN CANCER, HX OF 09/15/2009   GERD 12/19/2007   ALLERGY 12/19/2007    Immunization History  Administered Date(s) Administered   Fluad Quad(high Dose 65+) 06/04/2019, 07/02/2021, 07/16/2021   Influenza Split 08/04/2012   Influenza, High Dose Seasonal PF 06/12/2018, 06/03/2020   Influenza, Seasonal, Injecte, Preservative Fre 06/04/2019   Influenza,inj,Quad PF,6+ Mos 11/22/2014, 07/01/2015, 06/04/2016, 07/25/2017   Influenza-Unspecified 07/03/2021   PFIZER(Purple Top)SARS-COV-2 Vaccination 10/26/2019, 11/13/2019, 06/19/2020, 02/11/2021   PNEUMOCOCCAL CONJUGATE-20 05/20/2021   Pfizer Covid-19 Vaccine Bivalent Booster 6yr & up 07/16/2021   Pneumococcal Conjugate-13 05/17/2018   Tdap 10/01/2011, 05/20/2021   Zoster Recombinat (Shingrix) 07/25/2017, 12/22/2017   Zoster, Live 12/03/2015    Conditions to be addressed/monitored:  Hypothyroidism, HLD, Coronary artery calcification, IBS  There are no care plans that you recently modified to display for this patient.     Medication Assistance: None required.  Patient affirms current coverage meets needs.  Compliance/Adherence/Medication fill history:  Care Gaps: Mammogram  [facility]: simvastatin (ZOCOR) 20 MG tablet - last filled 10/21/21 90 days  Patient's preferred pharmacy is:  [facility] [place], [address] AT [address] [place] [address] Phone: [phone] Fax: [phone]  [facility] [address], [place] [address] Phone: [phone] Fax: [phone]  [facility] [address] [place], [place] - [address] [place] [address] Phone: [phone] Fax: [phone]  [id number] [place] [address] [place]: [phone]: [phone] Uses pill box? Yes Pt endorses 100% compliance  We discussed: Benefits of medication synchronization, packaging and delivery as well as enhanced pharmacist oversight with [facility]. Patient decided to: Continue current medication management strategy  Care Plan and Follow Up Patient Decision:  Patient agrees to Care Plan and Follow-up.  Plan: The care management team will reach out to the patient again over the next 180 days.  CBeverly Milch PharmD Clinical Pharmacist  [facility]([phone]  Current Barriers:  Ongoing cramping and muscle aches  Pharmacist Clinical Goal(s):  Patient will achieve improvement in cramping and muscle symptoms as evidenced by symptom level through collaboration with PharmD and provider.   Interventions: 1:1 collaboration with TMidge Minium MD regarding development and update of comprehensive plan of care as evidenced by provider attestation and co-signature Inter-disciplinary care team collaboration (see longitudinal plan of care) Comprehensive medication review performed; medication list updated in electronic medical record  Hyperlipidemia: (LDL goal < 100) -Controlled -Current treatment: Simvastatin 247m daily Appropriate, Effective, Query Safe -Medications previously tried: none noted  -Current exercise habits: walks almost daily -Educated on Cholesterol goals;  Benefits of statin for ASCVD risk reduction; Importance of limiting foods high in cholesterol; One of her main concerns at this time is ongoing muscle pain and cramping that has been going on for quite some time.   She states now the issue is in her neck but it started with cramping in her toes and calves.  She has been taking the same dose of simvastatin for at least 10 years and reports the cramps have been going on for at least this long. -Will consult with Dr. TaBirdie RiddleI do want her to stay on statin due to family history and her coronary artery calcification, however she may benefit from switching to Crestor which is thought to cause less muscle pain or cramping.  It may also be beneficial to check a CK to make sure she is not having rhabdo.  Will FU up with patient post consultation.   Hypothyroidism (Goal: Maintain TSH) -Controlled -Current treatment  NP Thyroid 6059maily Appropriate, Effective, Safe, Accessible -Medications previously tried: levothyroxine -She takes either levothyroxine or NP thyroid depending on what is available. TSH has been WNL recently  -Recommended to continue current medication No changes needed continue to take 30-60 minutes before food or other medicaitons  Coronary artery calcification (Goal: Reduce CV risk) -Controlled -Current treatment  None -Controlling risk factors, also has severe family history of MI, heart disease -She is on statin appropriately -Recommended to continue current medication  GERD (Goal: Minimize symptoms) -Controlled -Current treatment  Omeprazole 10m23mily Query Appropriate,, ,  -Medications previously tried: none noted -Long term PPI therapy, most recent DEXA scan showed low bone density in March 2022. Recommend  repeat DEXA 2 years later.  She states this really helps symptoms.  -Recommended to continue current medication Consider step down in the future, work on trigger foods for now.  Patient Goals/Self-Care Activities Patient will:  - take medications as prescribed as evidenced by patient report and record review Achieve improvement in cramping/muscle pains.  Follow Up Plan: The care management team will reach out to the  patient again over the next 180 days.

## 2022-06-17 ENCOUNTER — Ambulatory Visit (INDEPENDENT_AMBULATORY_CARE_PROVIDER_SITE_OTHER): Payer: Medicare HMO | Admitting: Family Medicine

## 2022-06-17 ENCOUNTER — Encounter: Payer: Self-pay | Admitting: Family Medicine

## 2022-06-17 VITALS — BP 148/70 | HR 73 | Temp 97.8°F | Ht 65.0 in | Wt 199.6 lb

## 2022-06-17 DIAGNOSIS — R030 Elevated blood-pressure reading, without diagnosis of hypertension: Secondary | ICD-10-CM

## 2022-06-17 DIAGNOSIS — R0602 Shortness of breath: Secondary | ICD-10-CM | POA: Diagnosis not present

## 2022-06-17 DIAGNOSIS — Z23 Encounter for immunization: Secondary | ICD-10-CM

## 2022-06-17 LAB — CBC WITH DIFFERENTIAL/PLATELET
Basophils Absolute: 0 10*3/uL (ref 0.0–0.1)
Basophils Relative: 0.5 % (ref 0.0–3.0)
Eosinophils Absolute: 0.1 10*3/uL (ref 0.0–0.7)
Eosinophils Relative: 2.1 % (ref 0.0–5.0)
HCT: 41.7 % (ref 36.0–46.0)
Hemoglobin: 14 g/dL (ref 12.0–15.0)
Lymphocytes Relative: 35.9 % (ref 12.0–46.0)
Lymphs Abs: 2.4 10*3/uL (ref 0.7–4.0)
MCHC: 33.6 g/dL (ref 30.0–36.0)
MCV: 87.6 fl (ref 78.0–100.0)
Monocytes Absolute: 0.6 10*3/uL (ref 0.1–1.0)
Monocytes Relative: 8.3 % (ref 3.0–12.0)
Neutro Abs: 3.6 10*3/uL (ref 1.4–7.7)
Neutrophils Relative %: 53.2 % (ref 43.0–77.0)
Platelets: 255 10*3/uL (ref 150.0–400.0)
RBC: 4.76 Mil/uL (ref 3.87–5.11)
RDW: 15.5 % (ref 11.5–15.5)
WBC: 6.7 10*3/uL (ref 4.0–10.5)

## 2022-06-17 LAB — HEPATIC FUNCTION PANEL
ALT: 39 U/L — ABNORMAL HIGH (ref 0–35)
AST: 30 U/L (ref 0–37)
Albumin: 4.3 g/dL (ref 3.5–5.2)
Alkaline Phosphatase: 92 U/L (ref 39–117)
Bilirubin, Direct: 0.1 mg/dL (ref 0.0–0.3)
Total Bilirubin: 0.6 mg/dL (ref 0.2–1.2)
Total Protein: 7.5 g/dL (ref 6.0–8.3)

## 2022-06-17 LAB — BASIC METABOLIC PANEL
BUN: 18 mg/dL (ref 6–23)
CO2: 29 mEq/L (ref 19–32)
Calcium: 9.7 mg/dL (ref 8.4–10.5)
Chloride: 103 mEq/L (ref 96–112)
Creatinine, Ser: 0.85 mg/dL (ref 0.40–1.20)
GFR: 69.35 mL/min (ref >60.00)
Glucose, Bld: 82 mg/dL (ref 70–99)
Potassium: 4.2 mEq/L (ref 3.5–5.1)
Sodium: 141 mEq/L (ref 135–145)

## 2022-06-17 LAB — TSH: TSH: 1 u[IU]/mL (ref 0.35–5.50)

## 2022-06-17 NOTE — Patient Instructions (Addendum)
Follow up in 3-4 weeks to recheck blood pressure We'll notify you of your lab results and make any changes if needed Your EKG looks great! We'll call you to schedule your pulmonary appt regarding the increased shortness of breath Today's number is mildly elevated but thankfully nothing that is scary or concerning in the short term If the blood pressure remains high at next visit we will likely start medication Call with any questions or concerns- especially if symptoms change or worsen Hang in there!!

## 2022-06-17 NOTE — Progress Notes (Signed)
   Subjective:    Patient ID: [name] [name], female    DOB: 02/04/1951, 71 y.o.   MRN: [id number]  HPI Elevated BP- pt reports she has never had issues w/ BP until the 'last month or so'.  On Monday went to oncology and it was 162/96.  Denies HAs.  Pt reports recent increased SOB.  Husband notes she will 'grunt or pant' w/ exertion- cleaning the house, walking.  No issues w/ 1 flight of stairs or getting the mail. No CP.  Occasional palpitations when short of breath.  No swelling of hands/feet.  Denies flushing.  Had stress test 04/22/21 that was a 'low risk study'.  Denies increased stress levels.     Review of Systems For ROS see HPI     Objective:   Physical Exam Vitals reviewed.  Constitutional:      General: She is not in acute distress.    Appearance: Normal appearance. She is well-developed. She is not ill-appearing.  HENT:     Head: Normocephalic and atraumatic.  Eyes:     Conjunctiva/sclera: Conjunctivae normal.     Pupils: Pupils are equal, round, and reactive to light.  Neck:     Thyroid: No thyromegaly.  Cardiovascular:     Rate and Rhythm: Normal rate and regular rhythm.     Pulses: Normal pulses.     Heart sounds: Normal heart sounds. No murmur heard. Pulmonary:     Effort: Pulmonary effort is normal. No respiratory distress.     Breath sounds: Normal breath sounds.  Abdominal:     General: There is no distension.     Palpations: Abdomen is soft.     Tenderness: There is no abdominal tenderness.  Musculoskeletal:     Cervical back: Normal range of motion and neck supple.     Right lower leg: No edema.     Left lower leg: No edema.  Lymphadenopathy:     Cervical: No cervical adenopathy.  Skin:    General: Skin is warm and dry.  Neurological:     Mental Status: She is alert and oriented to person, place, and time.  Psychiatric:        Mood and Affect: Mood normal.        Behavior: Behavior normal.           Assessment & Plan:  Elevated BP- new.  Pt  has never had an issue w/ BP but at oncology earlier this week BP was 162/96 and is again mildly elevated today.  Thankfully she is not having HAs, visual changes, edema, CP, flushing.  Will check labs today to determine if there is an underlying cause for elevated BP- thyroid, anemia, etc.  Will follow up in 3-4 weeks and if BP remains elevated at that time, will start medication.  Pt expressed understanding and is in agreement w/ plan.   SOB w/ exertion- new.  Unclear if this is related to elevated BP, extreme heat, or other cause.  EKG WNL- no arrhythmia.  She had stress test last year that was WNL.  Will refer to pulmonary for complete evaluation.  Pt expressed understanding and is in agreement w/ plan.

## 2022-06-23 ENCOUNTER — Telehealth: Payer: Medicare HMO

## 2022-06-30 ENCOUNTER — Ambulatory Visit (INDEPENDENT_AMBULATORY_CARE_PROVIDER_SITE_OTHER): Payer: Medicare HMO | Admitting: Pharmacist

## 2022-06-30 DIAGNOSIS — E782 Mixed hyperlipidemia: Secondary | ICD-10-CM

## 2022-06-30 DIAGNOSIS — E038 Other specified hypothyroidism: Secondary | ICD-10-CM

## 2022-06-30 NOTE — Progress Notes (Unsigned)
Chronic Care Management Pharmacy Note  07/01/2022 Name:  [name] [name] MRN:  [id number] DOB:  1950/11/19  Summary: PharmD FU visit.  No changes at this time as cramping/aching has improved.  LDL is controlled at this time so no changes needed.  Recommendations/Changes made from today's visit: No changes - patient doing better overall  Plan: FU 12 months   Subjective: [name] [name] is an 71 y.o. year old female who is a primary patient of Tabori, Aundra Millet, MD.  The CCM team was consulted for assistance with disease management and care coordination needs.    Engaged with patient by telephone for follow up visit in response to provider referral for pharmacy case management and/or care coordination services.   Consent to Services:  The patient was given the following information about Chronic Care Management services today, agreed to services, and gave verbal consent: 1. CCM service includes personalized support from designated clinical staff supervised by the primary care provider, including individualized plan of care and coordination with other care providers 2. 24/7 contact phone numbers for assistance for urgent and routine care needs. 3. Service will only be billed when office clinical staff spend 20 minutes or more in a month to coordinate care. 4. Only one practitioner may furnish and bill the service in a calendar month. 5.The patient may stop CCM services at any time (effective at the end of the month) by phone call to the office staff. 6. The patient will be responsible for cost sharing (co-pay) of up to 20% of the service fee (after annual deductible is met). Patient agreed to services and consent obtained.  Patient Care Team: Midge Minium, MD as PCP - General (Family Medicine) Berniece Salines, DO as PCP - Cardiology (Cardiology) Roque Cash., MD as Consulting Physician (Obstetrics and Gynecology) Susie Cassette, Katharine Look, MD as Referring Physician  (Dermatology) Elsie Saas, MD as Consulting Physician (Orthopedic Surgery) Regal, Tamala Fothergill, DPM as Consulting Physician (Podiatry) Janann August, MD as Referring Physician (Dermatology) Darleen Crocker, MD as Consulting Physician (Ophthalmology) Edythe Clarity, [facility] as Pharmacist (Pharmacist) Edythe Clarity, [facility] (Pharmacist) Recent office visits:  11/20/21 Annye Asa, MD - Family Medicine- Mixed Hyperlipidemia - Labs were ordered. Referral to Neurology placed - No medication changes. Follow up in 6 months.   10/28/21 Nedra Hai, NP - Sinusitis - azelastine (ASTELIN) 0.1 % nasal spray and azithromycin (ZITHROMAX) 250 MG tablet were prescribed.    Recent consult visits:  08/05/21 Fredricka Bonine, MD - ENT - Malignant Melanoma of neck - Follow up in 6 months for surveillance 07/30/21 Physical Therapy - right shoulder pain   07/17/21  Physical Therapy - right shoulder pain   10/13/2 [place] - no notes available   07/15/21 Hollar, Clement Husbands - Dermatology - Malignant melanoma of skin - No notes available.    07/01/21 Hollar, [place]ak[place] - Dermatology - Maligant melanoma of skin - No notes available.    06/29/21  Physical Therapy - right shoulder pain   06/24/21  Physical Therapy - right shoulder pain   06/11/21  Physical Therapy - right shoulder pain     Hospital visits:  None in previous 6 months   Objective:  Lab Results  Component Value Date   CREATININE 0.85 06/17/2022   BUN 18 06/17/2022   GFR 69.35 06/17/2022   GFRNONAA >60 01/05/2017   GFRAA >60 01/05/2017   NA 141 06/17/2022   K 4.2 06/17/2022   CALCIUM 9.7 06/17/2022   CO2  29 06/17/2022   GLUCOSE 82 06/17/2022    Lab Results  Component Value Date/Time   HGBA1C 5.5 03/18/2015 12:00 AM   GFR 69.35 06/17/2022 01:52 PM   GFR 77.19 11/20/2021 09:11 AM    Last diabetic Eye exam: No results found for: "HMDIABEYEEXA"  Last diabetic Foot exam: No results found for: "HMDIABFOOTEX"    Lab Results  Component Value Date   CHOL 171 11/20/2021   HDL 73.10 11/20/2021   LDLCALC 80 11/20/2021   TRIG 92.0 11/20/2021   CHOLHDL 2 11/20/2021       Latest Ref Rng & Units 06/17/2022    1:52 PM 11/20/2021    9:11 AM 05/20/2021   10:32 AM  Hepatic Function  Total Protein 6.0 - 8.3 g/dL 7.5  7.0  7.3   Albumin 3.5 - 5.2 g/dL 4.3  4.5  4.4   AST 0 - 37 U/L '30  23  23   ' ALT 0 - 35 U/L 39  22  22   Alk Phosphatase 39 - 117 U/L 92  98  97   Total Bilirubin 0.2 - 1.2 mg/dL 0.6  0.7  0.7   Bilirubin, Direct 0.0 - 0.3 mg/dL 0.1  0.2      Lab Results  Component Value Date/Time   TSH 1.00 06/17/2022 01:52 PM   TSH 2.19 11/20/2021 09:11 AM   FREET4 1.1 02/18/2017 03:52 PM       Latest Ref Rng & Units 06/17/2022    1:52 PM 11/20/2021    9:11 AM 05/20/2021   10:32 AM  CBC  WBC 4.0 - 10.5 K/uL 6.7  5.1  5.9   Hemoglobin 12.0 - 15.0 g/dL 14.0  13.2  13.3   Hematocrit 36.0 - 46.0 % 41.7  40.5  41.1   Platelets 150.0 - 400.0 K/uL 255.0  214.0  241.0     Lab Results  Component Value Date/Time   VD25OH 64.73 11/20/2021 09:11 AM   VD25OH 52.00 11/20/2020 08:42 AM    Clinical ASCVD: No  The 10-year ASCVD risk score (Arnett DK, et al., 2019) is: 11.2%   Values used to calculate the score:     Age: 29 years     Sex: Female     Is Non-Hispanic African American: No     Diabetic: No     Tobacco smoker: No     Systolic Blood Pressure: 194 mmHg     Is BP treated: No     HDL Cholesterol: 73.1 mg/dL     Total Cholesterol: 171 mg/dL       06/17/2022   12:54 PM 12/10/2021    1:18 PM 12/10/2021    1:16 PM  Depression screen PHQ 2/9  Decreased Interest 0 0 0  Down, Depressed, Hopeless 0 0 0  PHQ - 2 Score 0 0 0     Social History   Tobacco Use  Smoking Status Former   Years: 6.00   Types: Cigarettes   Quit date: 10/05/1975   Years since quitting: 46.7   Passive exposure: Past  Smokeless Tobacco Never   BP Readings from Last 3 Encounters:  06/17/22 (!) 148/70  03/30/22  140/80  01/11/22 119/71   Pulse Readings from Last 3 Encounters:  06/17/22 73  03/30/22 61  01/11/22 77   Wt Readings from Last 3 Encounters:  06/17/22 199 lb 9.6 oz (90.5 kg)  03/30/22 192 lb 3.2 oz (87.2 kg)  03/15/22 197 lb 3.2 oz (89.4 kg)   BMI Readings  from Last 3 Encounters:  06/17/22 33.22 kg/m  03/30/22 31.98 kg/m  03/15/22 32.82 kg/m    Assessment/Interventions: Review of patient past medical history, allergies, medications, health status, including review of consultants reports, laboratory and other test data, was performed as part of comprehensive evaluation and provision of chronic care management services.   SDOH:  (Social Determinants of Health) assessments and interventions performed: No, done within the last year SDOH Interventions    Flowsheet Row Clinical Support from 12/10/2021 in [place]  SDOH Interventions   Food Insecurity Interventions Intervention Not Indicated  Housing Interventions Intervention Not Indicated  Transportation Interventions Intervention Not Indicated  Financial Strain Interventions Intervention Not Indicated  Physical Activity Interventions Intervention Not Indicated  Stress Interventions Intervention Not Indicated  Social Connections Interventions Intervention Not Indicated      Financial Resource Strain: Low Risk  (12/10/2021)   Overall Financial Resource Strain (CARDIA)    Difficulty of Paying Living Expenses: Not hard at all   Food Insecurity: No Food Insecurity (12/10/2021)   Hunger Vital Sign    Worried About Running Out of Food in the Last Year: Never true    Ran Out of Food in the Last Year: Never true    SDOH Screenings   Food Insecurity: No Food Insecurity (12/10/2021)  Housing: Low Risk  (12/10/2021)  Transportation Needs: No Transportation Needs (12/10/2021)  Alcohol Screen: Low Risk  (12/10/2021)  Depression (PHQ2-9): Low Risk  (06/17/2022)  Financial Resource Strain: Low Risk   (12/10/2021)  Physical Activity: Insufficiently Active (12/10/2021)  Social Connections: Moderately Integrated (12/10/2021)  Stress: No Stress Concern Present (12/10/2021)  Tobacco Use: Medium Risk (06/17/2022)    CCM Care Plan  Allergies  Allergen Reactions   Clindamycin/Lincomycin Diarrhea   Flagyl [Metronidazole Hcl] Diarrhea    Stomach upset. Pt states she CANNOT TAKE oral, has no problem with topical.    Augmentin [Amoxicillin-Pot Clavulanate] Diarrhea   Telithromycin Diarrhea    Medications Reviewed Today     Reviewed by Edythe Clarity, [facility] (Pharmacist) on 07/01/22 at [phone]  Med List Status: <None>   Medication Order Taking? Sig Documenting Provider Last Dose Status Informant  0.9 %  sodium chloride infusion [id number]   Ladene Artist, MD  Active   aspirin 325 MG tablet [id number] Yes Take 325 mg by mouth daily. [provider] Taking Active Self           Med Note Laurance Flatten, PATINA S   Mon Jan 09, 2018  1:13 PM)    B Complex-C (SUPER B COMPLEX PO) [id number] Yes Take 1 tablet by mouth daily.  [provider] Taking Active   brimonidine (ALPHAGAN) 0.2 % ophthalmic solution [id number] Yes 1 drop 3 (three) times daily. [provider] Taking Active   cetirizine (ZYRTEC) 10 MG tablet [id number] Yes Take 10 mg by mouth daily. [provider] Taking Active   Cholecalciferol (VITAMIN D3) 5000 UNITS CAPS [id number] Yes Take 5,000 Units by mouth daily.  [provider] Taking Active Self  Coenzyme Q10 (COQ10) 200 MG CAPS [id number] Yes Take 1 capsule by mouth daily.  [provider] Taking Active   doxycycline (ADOXA) 50 MG tablet [id number] Yes Take 50 mg by mouth daily. For rosacea [provider] Taking Active Self  Magnesium 200 MG TABS [id number] Yes Take 200 mg by mouth in the morning and at bedtime.  [provider] Taking Active Self           Med Note (YOUNG,  TIERICA T   Wed May 28, 2020 10:35 AM)    Multiple  Vitamins-Minerals (MULTIVITAMIN PO) [id number] Yes Take 1 tablet by mouth daily. [provider] Taking Active Self  NP THYROID 60 MG tablet [id number] Yes Take 1 tablet (60 mg total) by mouth daily before breakfast. Midge Minium, MD Taking Active   omeprazole (PRILOSEC) 20 MG capsule [id number] Yes TAKE 1 CAPSULE EVERY DAY Midge Minium, MD Taking Active   simvastatin (ZOCOR) 20 MG tablet [id number] Yes TAKE 1 TABLET AT BEDTIME Midge Minium, MD Taking Active   SYSTANE BALANCE 0.6 % SOLN [id number] Yes SMARTSIG:1 Drop(s) In Eye(s) PRN [provider] Taking Active   tiZANidine (ZANAFLEX) 2 MG tablet [id number] Yes Take 2 mg by mouth every 8 (eight) hours as needed. [provider] Taking Active   Travoprost, BAK Free, (TRAVATAN) 0.004 % SOLN ophthalmic solution [id number] Yes Place 1 drop into both eyes at bedtime.  [provider] Taking Active             Patient Active Problem List   Diagnosis Date Noted   Precordial pain 04/09/2021   DOE (dyspnea on exertion) 04/09/2021   Coronary artery calcification seen on CAT scan 04/09/2021   Mixed hyperlipidemia 04/09/2021   Cataract 03/16/2021   Glaucoma 03/16/2021   Skin cancer (melanoma) ([place]) 03/16/2021   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/21/2021   Malignant melanoma of neck ([place]) 10/09/2020   Tremor of both hands 05/21/2020   Incontinence of feces    Bloating 08/22/2019   Fecal soiling 08/22/2019   Contact with and (suspected) exposure to other viral communicable diseases 07/03/2019   Diarrhea 06/30/2019   Headache 06/30/2019   Obesity (BMI 30-39.9) 11/15/2018   Anxiety about health 05/17/2018   Hx of adenomatous colonic polyps 01/17/2017   Postoperative nausea and vomiting 09/02/2015   Primary localized osteoarthritis of left knee    IBS (irritable bowel syndrome)    Physical exam 05/30/2015   Hypothyroidism 11/22/2014   Vitamin D deficiency 09/21/2009   SKIN  CANCER, HX OF 09/15/2009   GERD 12/19/2007   ALLERGY 12/19/2007    Immunization History  Administered Date(s) Administered   Fluad Quad(high Dose 65+) 06/04/2019, 07/02/2021, 07/16/2021, 06/17/2022   Influenza Split 08/04/2012   Influenza, High Dose Seasonal PF 06/12/2018, 06/03/2020   Influenza, Seasonal, Injecte, Preservative Fre 06/04/2019   Influenza,inj,Quad PF,6+ Mos 11/22/2014, 07/01/2015, 06/04/2016, 07/25/2017   Influenza-Unspecified 07/03/2021   PFIZER(Purple Top)SARS-COV-2 Vaccination 10/26/2019, 11/13/2019, 06/19/2020, 02/11/2021   PNEUMOCOCCAL CONJUGATE-20 05/20/2021   Pfizer Covid-19 Vaccine Bivalent Booster 86yr & up 07/16/2021   Pneumococcal Conjugate-13 05/17/2018   Tdap 10/01/2011, 05/20/2021   Zoster Recombinat (Shingrix) 07/25/2017, 12/22/2017   Zoster, Live 12/03/2015    Conditions to be addressed/monitored:  Hypothyroidism, HLD, Coronary artery calcification, IBS  Care Plan : General Pharmacy (Adult)  Updates made by DEdythe Clarity RPH since 07/01/2022 12:00 AM     Problem: Hypothyroidism, HLD, Coronary artery calcification,   Priority: High  Onset Date: 12/09/2021     Long-Range Goal: Patient-Specific Goal   Start Date: 12/09/2021  Expected End Date: 06/11/2022  Recent Progress: On track  Priority: High  Note:   Current Barriers:  Ongoing cramping and muscle aches  Pharmacist Clinical Goal(s):  Patient will achieve improvement in cramping and muscle symptoms as evidenced by symptom level through collaboration with PharmD and provider.   Interventions: 1:1 collaboration with TMidge Minium MD regarding development and update of comprehensive plan of care as  evidenced by provider attestation and co-signature Inter-disciplinary care team collaboration (see longitudinal plan of care) Comprehensive medication review performed; medication list updated in electronic medical record  Hyperlipidemia: (LDL goal < 100) 06/30/22 -Controlled, based on  most recent LDL 80 -Current treatment: Simvastatin 23m  daily Appropriate, Effective, Safe, Effective -Medications previously tried: none noted  -Current exercise habits: walks almost daily -Educated on Cholesterol goals;  Benefits of statin for ASCVD risk reduction; Importance of limiting foods high in cholesterol; Pain in her neck and other cramping has improved since our last visit.  She was given some injections and has not had as much problem with this as of late. LDL is well controlled, she continues to be adherent with statin. Current ASCVD risk 11.2%. No changes at this time, continue to take as directed.   Hypothyroidism (Goal: Maintain TSH) 06/30/22 -Controlled -Current treatment  NP Thyroid 644mdaily Appropriate, Effective, Safe, Accessible -Medications previously tried: levothyroxine -She takes either levothyroxine or NP thyroid depending on what is available. TSH has been WNL recently  -Recommended to continue current medication No changes needed continue to take 30-60 minutes before food or other medicaitons -Continues to take as directed, no changed needed at this time as TSH was normal.  Coronary artery calcification (Goal: Reduce CV risk) -Controlled -Current treatment  None -Controlling risk factors, also has severe family history of MI, heart disease -She is on statin appropriately -Recommended to continue current medication  GERD (Goal: Minimize symptoms) -Controlled -Current treatment  Omeprazole 2079maily Query Appropriate,, ,  -Medications previously tried: none noted -Long term PPI therapy, most recent DEXA scan showed low bone density in March 2022. Recommend repeat DEXA 2 years later.  She states this really helps symptoms.  -Recommended to continue current medication Consider step down in the future, work on trigger foods for now.  Patient Goals/Self-Care Activities Patient will:  - take medications as prescribed as evidenced by patient  report and record review Achieve improvement in cramping/muscle pains.  Follow Up Plan: The care management team will reach out to the patient again over the next 365 days.          Medication Assistance: None required.  Patient affirms current coverage meets needs.  Compliance/Adherence/Medication fill history: Care Gaps: Mammogram  [facility]: simvastatin (ZOCOR) 20 MG tablet - last filled 05/26/22 90ds  [facility]  [facility]one: [phone]: [phone] [place] [place]one: [phone]: [phone] [address] [id number] [place]one: [phone]: [phone]/pharmacy #[id number] [place]ACWal[place] -[place]ne: [phone]: [phone] pill box? Yes Pt endorses 100% compliance  We discussed: Benefits of medication synchronization, packaging and delivery as well as enhanced pharmacist oversight with [employer]. Patient decided to: Continue current medication management strategy  Care Plan and Follow Up Patient Decision:  Patient agrees to Care Plan and Follow-up.  Plan: The care management team will reach out to the patient again over the next 180 days.  ChriBeverly MilcharmD Clinical Pharmacist  [phone]

## 2022-07-01 NOTE — Patient Instructions (Addendum)
Visit Information   Goals Addressed             This Visit's Progress    Improve cramping   On track    Timeframe:  Long-Range Goal Priority:  High Start Date:  12/09/21                           Expected End Date:  06/11/22                     Follow Up Date 03/11/22    Work toward solutions to improve cramping symptoms    Why is this important?   These steps will help you keep on track with your medicines.   Notes:        Patient Care Plan: General Pharmacy (Adult)     Problem Identified: Hypothyroidism, HLD, Coronary artery calcification,   Priority: High  Onset Date: 12/09/2021     Long-Range Goal: Patient-Specific Goal   Start Date: 12/09/2021  Expected End Date: 06/11/2022  Recent Progress: On track  Priority: High  Note:   Current Barriers:  Ongoing cramping and muscle aches  Pharmacist Clinical Goal(s):  Patient will achieve improvement in cramping and muscle symptoms as evidenced by symptom level through collaboration with PharmD and provider.   Interventions: 1:1 collaboration with Midge Minium, MD regarding development and update of comprehensive plan of care as evidenced by provider attestation and co-signature Inter-disciplinary care team collaboration (see longitudinal plan of care) Comprehensive medication review performed; medication list updated in electronic medical record  Hyperlipidemia: (LDL goal < 100) 06/30/22 -Controlled, based on most recent LDL 80 -Current treatment: Simvastatin '20mg'$   daily Appropriate, Effective, Safe, Effective -Medications previously tried: none noted  -Current exercise habits: walks almost daily -Educated on Cholesterol goals;  Benefits of statin for ASCVD risk reduction; Importance of limiting foods high in cholesterol; Pain in her neck and other cramping has improved since our last visit.  She was given some injections and has not had as much problem with this as of late. LDL is well controlled, she continues to  be adherent with statin. Current ASCVD risk 11.2%. No changes at this time, continue to take as directed.   Hypothyroidism (Goal: Maintain TSH) 06/30/22 -Controlled -Current treatment  NP Thyroid '60mg'$  daily Appropriate, Effective, Safe, Accessible -Medications previously tried: levothyroxine -She takes either levothyroxine or NP thyroid depending on what is available. TSH has been WNL recently  -Recommended to continue current medication No changes needed continue to take 30-60 minutes before food or other medicaitons -Continues to take as directed, no changed needed at this time as TSH was normal.  Coronary artery calcification (Goal: Reduce CV risk) -Controlled -Current treatment  None -Controlling risk factors, also has severe family history of MI, heart disease -She is on statin appropriately -Recommended to continue current medication  GERD (Goal: Minimize symptoms) -Controlled -Current treatment  Omeprazole '20mg'$  daily Query Appropriate,, ,  -Medications previously tried: none noted -Long term PPI therapy, most recent DEXA scan showed low bone density in March 2022. Recommend repeat DEXA 2 years later.  She states this really helps symptoms.  -Recommended to continue current medication Consider step down in the future, work on trigger foods for now.  Patient Goals/Self-Care Activities Patient will:  - take medications as prescribed as evidenced by patient report and record review Achieve improvement in cramping/muscle pains.  Follow Up Plan: The care management team will reach out to the patient  again over the next 365 days.          The patient verbalized understanding of instructions, educational materials, and care plan provided today and DECLINED offer to receive copy of patient instructions, educational materials, and care plan.  Telephone follow up appointment with pharmacy team member scheduled for: 37 months  Bunny Kleist L Laquitta Dominski, [place],  PharmD Clinical Pharmacist  [facility] [phone]

## 2022-07-03 DIAGNOSIS — E785 Hyperlipidemia, unspecified: Secondary | ICD-10-CM | POA: Diagnosis not present

## 2022-07-03 DIAGNOSIS — E038 Other specified hypothyroidism: Secondary | ICD-10-CM | POA: Diagnosis not present

## 2022-07-10 NOTE — Progress Notes (Signed)
Synopsis: Referred in October 2023 for dyspnea  Subjective:   PATIENT ID: [name] [name] GENDER: female DOB: Aug 09, 1951, MRN: [id number]   HPI  Chief Complaint  Patient presents with   Consult    Referred by PCP for DOE. States she has noticed this for the past 4-5 months.     [name] is here to se me for dyspnea > when she is exerting herself her husband has noted that she is grunting when she breathing and breathing hard > it's been going on for 4-5 months > she says that she is very active, and this isn't limiting her activity > she will feel dyspnea when she climbs stairs.  > She walks through the grocery store, can climb a flight of stairs, can carry in groceries  She has significant sinus drainage will sometimes cough up mucus that drains down the back of her throat.  However she doesn't have the sense that she is coughing up mucus from her chest.   No new weakness or numbness.    She walks around when her watch tells her too.  She's had a knee and shoulder repoacement so it has been a long time since she exercised.    Childhood was normal, no respiratory illnesses  She smoked cigarettes from [place] until 1976, quit after smoking 1ppd for 6 years.  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  She says there was some terrible duct work in the building she used to work in, but she hasn't been there in 22 years.    Record review: Last primary care visit with Dr. Birdie Riddle reviewed where the patient was seen for shortness of breath.  She was referred to [place] for further evaluation.  The dyspnea is felt to be noncardiac in nature.  Past Medical History:  Diagnosis Date   Allergy 12/19/2007   Bronchitis    Cataract    Cognitive dysfunction 07/14/2022   Coronary artery calcification seen on CAT scan 04/09/2021   DJD (degenerative joint disease)    DOE (dyspnea on exertion) 04/09/2021   GERD (gastroesophageal reflux  disease) 12/19/2007   Glaucoma    History of adenomatous colonic polyps 01/17/2017   Hypercholesterolemia    Hypothyroidism    IBS (irritable bowel syndrome)    Malignant melanoma of neck    Mixed hyperlipidemia 04/09/2021   OSA (obstructive sleep apnea) 12/27/2017   no CPAP   Osteoporosis    Plantar fasciitis    Postoperative nausea and vomiting 09/02/2015   Precordial pain 04/09/2021   Presence of shoulder implant 01/21/2021   Primary hypertension 12/19/2007   Primary localized osteoarthritis of left knee    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/21/2021   Tremor of both hands 05/21/2020   Vitamin D deficiency 09/21/2009     Family History  Problem Relation Age of Onset   Tremor Mother        no PD   Esophageal cancer Father    Colon polyps Father    Cancer Father        laryngeal   Heart disease Father    Osteoporosis Sister    Hypertension Brother    Heart disease Maternal Grandfather    Heart disease Paternal Grandfather    Diabetes Son    Tremor Maternal Aunt    Alzheimer's disease Maternal Aunt    Colon cancer Neg Hx    Stomach cancer Neg Hx  Crohn's disease Neg Hx    Rectal cancer Neg Hx      Social History   Socioeconomic History   Marital status: Married    Spouse name: Marya Amsler [phone]   Number of children: 2   Years of education: 54   Highest education level: High school graduate  Occupational History   Occupation: retired  Tobacco Use   Smoking status: Former    Years: 6.00    Types: Cigarettes    Quit date: 10/05/1975    Years since quitting: 46.8    Passive exposure: Past   Smokeless tobacco: Never  Vaping Use   Vaping Use: Never used  Substance and Sexual Activity   Alcohol use: Yes    Alcohol/week: 7.0 standard drinks of alcohol    Types: 7 Standard drinks or equivalent per week    Comment: 1 beverage nightly   Drug use: No   Sexual activity: Not on file  Other Topics Concern   Not on file  Social History Narrative    Not on file   Social Determinants of Health   Financial Resource Strain: Low Risk  (12/10/2021)   Overall Financial Resource Strain (CARDIA)    Difficulty of Paying Living Expenses: Not hard at all  Food Insecurity: No Food Insecurity (12/10/2021)   Hunger Vital Sign    Worried About Running Out of Food in the Last Year: Never true    Ran Out of Food in the Last Year: Never true  Transportation Needs: No Transportation Needs (12/10/2021)   PRAPARE - Hydrologist (Medical): No    Lack of Transportation (Non-Medical): No  Physical Activity: Insufficiently Active (12/10/2021)   Exercise Vital Sign    Days of Exercise per Week: 3 days    Minutes of Exercise per Session: 30 min  Stress: No Stress Concern Present (12/10/2021)   [place]    Feeling of Stress : Not at all  Social Connections: Moderately Integrated (12/10/2021)   Social Connection and Isolation Panel [NHANES]    Frequency of Communication with Friends and Family: Twice a week    Frequency of Social Gatherings with Friends and Family: Twice a week    Attends Religious Services: Never    Marine scientist or Organizations: Yes    Attends Music therapist: More than 4 times per year    Marital Status: Married  Human resources officer Violence: Not At Risk (12/10/2021)   Humiliation, Afraid, Rape, and Kick questionnaire    Fear of Current or Ex-Partner: No    Emotionally Abused: No    Physically Abused: No    Sexually Abused: No     Allergies  Allergen Reactions   Clindamycin/Lincomycin Diarrhea   Flagyl [Metronidazole Hcl] Diarrhea    Stomach upset. Pt states she CANNOT TAKE oral, has no problem with topical.    Augmentin [Amoxicillin-Pot Clavulanate] Diarrhea   Telithromycin Diarrhea     Outpatient Medications Prior to Visit  Medication Sig Dispense Refill   aspirin 325 MG tablet Take 325 mg by mouth daily.     B  Complex-C (SUPER B COMPLEX PO) Take 1 tablet by mouth daily.      brimonidine (ALPHAGAN) 0.2 % ophthalmic solution 1 drop 3 (three) times daily.     cetirizine (ZYRTEC) 10 MG tablet Take 10 mg by mouth daily.     Cholecalciferol (VITAMIN D3) 5000 UNITS CAPS Take 5,000 Units by mouth daily.  Coenzyme Q10 (COQ10) 200 MG CAPS Take 1 capsule by mouth daily.      doxycycline (ADOXA) 50 MG tablet Take 50 mg by mouth daily. For rosacea     losartan (COZAAR) 50 MG tablet Take 1 tablet (50 mg total) by mouth daily. 30 tablet 3   Magnesium 200 MG TABS Take 200 mg by mouth in the morning and at bedtime.      Multiple Vitamins-Minerals (MULTIVITAMIN PO) Take 1 tablet by mouth daily.     NP THYROID 60 MG tablet Take 1 tablet (60 mg total) by mouth daily before breakfast. 90 tablet 1   omeprazole (PRILOSEC) 20 MG capsule TAKE 1 CAPSULE EVERY DAY 90 capsule 0   simvastatin (ZOCOR) 20 MG tablet TAKE 1 TABLET AT BEDTIME 90 tablet 1   SYSTANE BALANCE 0.6 % SOLN SMARTSIG:1 Drop(s) In Eye(s) PRN     Travoprost, BAK Free, (TRAVATAN) 0.004 % SOLN ophthalmic solution Place 1 drop into both eyes at bedtime.      Facility-Administered Medications Prior to Visit  Medication Dose Route Frequency Provider Last Rate Last Admin   0.9 %  sodium chloride infusion  500 mL Intravenous Once Ladene Artist, MD        Review of Systems  Constitutional:  Negative for chills, fever, malaise/fatigue and weight loss.  HENT:  Positive for congestion. Negative for nosebleeds, sinus pain and sore throat.   Eyes:  Negative for photophobia, pain and discharge.  Respiratory:  Positive for cough, sputum production and shortness of breath. Negative for hemoptysis and wheezing.   Cardiovascular:  Negative for chest pain, palpitations, orthopnea and leg swelling.  Gastrointestinal:  Negative for abdominal pain, constipation, diarrhea, nausea and vomiting.  Genitourinary:  Negative for dysuria, frequency, hematuria and urgency.   Musculoskeletal:  Negative for back pain, joint pain, myalgias and neck pain.  Skin:  Negative for itching and rash.  Neurological:  Negative for tingling, tremors, sensory change, speech change, focal weakness, seizures, weakness and headaches.  Psychiatric/Behavioral:  Negative for memory loss, substance abuse and suicidal ideas. The patient is not nervous/anxious.       Objective:  Physical Exam   Vitals:   07/15/22 1005  BP: 116/68  Pulse: 75  SpO2: 98%  Weight: 198 lb 4.8 oz (89.9 kg)  Height: '5\' 5"'$  (1.651 m)   RA  Gen: well appearing, no acute distress HENT: NCAT, OP clear, neck supple without masses Eyes: PERRL, EOMi Lymph: no cervical lymphadenopathy PULM: CTA B CV: RRR, no mgr, no JVD GI: BS+, soft, nontender, no hsm Derm: no rash or skin breakdown MSK: normal bulk and tone Neuro: A&Ox4, CN II-XII intact, strength 5/5 in all 4 extremities Psyche: normal mood and affect   CBC    Component Value Date/Time   WBC 6.7 06/17/2022 1352   RBC 4.76 06/17/2022 1352   HGB 14.0 06/17/2022 1352   HCT 41.7 06/17/2022 1352   PLT 255.0 06/17/2022 1352   MCV 87.6 06/17/2022 1352   MCH 28.6 02/18/2017 1552   MCHC 33.6 06/17/2022 1352   RDW 15.5 06/17/2022 1352   LYMPHSABS 2.4 06/17/2022 1352   MONOABS 0.6 06/17/2022 1352   EOSABS 0.1 06/17/2022 1352   BASOSABS 0.0 06/17/2022 1352     Chest imaging: 06/2022 CT chest > 45m pulm nodule in LUL,, 430mnodule RML. Stable scarring in RUL, images independently reviewed and there is a bandlike area of atelectasis in the right upper lobe, medial border, however this is small, no surrounding groundglass  or other consolidation findings.  PFT:  Labs: September 2023 hemoglobin 14.1 g/dL  Path:  Echo:  Heart Catheterization:  Stress test 04/2019 myoview: The left ventricular ejection fraction is hyperdynamic (>65%). Nuclear stress EF: 68%.  There was no ST segment deviation noted during stress. The study is normal. There  are no perfusion defects suggestive of ischemia or infarction. This is a low risk study.     Assessment & Plan:   DOE (dyspnea on exertion)  Allergic rhinitis, unspecified seasonality, unspecified trigger  Pulmonary nodules  Discussion: [name] [name] is here to see me for shortness of breath which is noticed more by her husband than for her.  Today physical exam is normal, oxygenation is normal, my review of the CT scan of her chest from a few weeks ago shows really no significant pulmonary parenchymal abnormality with the exception of the small nodules.  There is a slight scar in the right upper lobe which is unlikely to be contributing to any symptoms.  I explained to her that the differential diagnosis of dyspnea is broad and includes lung disease, heart disease, anemia, neuromuscular weakness.  In her particular case I think the most likely etiology of this very mild shortness of breath is physical deconditioning from lack of exercise.  Plan: Shortness of breath: Lung function test Check oximetry while walking in the office Start exercising more: Exercise is an important way to stay healthy, live longer, and have a better quality of life. You should dedicate time 3-5 days a week to intentional exercise. Start by planning a workout you know you can complete.  Once you have completed it try repeating it 2 more times in a week before you make it harder. The best and most effective exercise routines gradually get more difficult (the walk is longer, the run is faster, the weight is higher, etc.). Your consistency will guarantee success more than any other factor: stick with it no matter what.  No excuses. It's OK to feel short of breath when you exercise, but lightheadedness or pain is not OK.   If you are not comfortable exercising on your own let me know so we can find someone to help you.  Pulmonary nodules: Continue follow up with [facility]   Return to clinic in 4 to 6 weeks to go over the  results of the lung function test.  If you are still having problems with shortness of breath at that point we can consider other testing (CPET, echo etc), however I think your symptoms will improve with exercise.  Immunizations: Immunization History  Administered Date(s) Administered   Fluad Quad(high Dose 65+) 06/04/2019, 07/02/2021, 07/16/2021, 06/17/2022   Influenza Split 08/04/2012   Influenza, High Dose Seasonal PF 06/12/2018, 06/03/2020   Influenza, Seasonal, Injecte, Preservative Fre 06/04/2019   Influenza,inj,Quad PF,6+ Mos 11/22/2014, 07/01/2015, 06/04/2016, 07/25/2017   Influenza-Unspecified 07/03/2021   PFIZER(Purple Top)SARS-COV-2 Vaccination 10/26/2019, 11/13/2019, 06/19/2020, 02/11/2021   PNEUMOCOCCAL CONJUGATE-20 05/20/2021   Pfizer Covid-19 Vaccine Bivalent Booster 47yr & up 07/16/2021   Pneumococcal Conjugate-13 05/17/2018   Tdap 10/01/2011, 05/20/2021   Zoster Recombinat (Shingrix) 07/25/2017, 12/22/2017   Zoster, Live 12/03/2015     Current Outpatient Medications:    aspirin 325 MG tablet, Take 325 mg by mouth daily., Disp: , Rfl:    B Complex-C (SUPER B COMPLEX PO), Take 1 tablet by mouth daily. , Disp: , Rfl:    brimonidine (ALPHAGAN) 0.2 % ophthalmic solution, 1 drop 3 (three) times daily., Disp: , Rfl:    cetirizine (ZYRTEC)  10 MG tablet, Take 10 mg by mouth daily., Disp: , Rfl:    Cholecalciferol (VITAMIN D3) 5000 UNITS CAPS, Take 5,000 Units by mouth daily. , Disp: , Rfl:    Coenzyme Q10 (COQ10) 200 MG CAPS, Take 1 capsule by mouth daily. , Disp: , Rfl:    doxycycline (ADOXA) 50 MG tablet, Take 50 mg by mouth daily. For rosacea, Disp: , Rfl:    losartan (COZAAR) 50 MG tablet, Take 1 tablet (50 mg total) by mouth daily., Disp: 30 tablet, Rfl: 3   Magnesium 200 MG TABS, Take 200 mg by mouth in the morning and at bedtime. , Disp: , Rfl:    Multiple Vitamins-Minerals (MULTIVITAMIN PO), Take 1 tablet by mouth daily., Disp: , Rfl:    NP THYROID 60 MG tablet, Take  1 tablet (60 mg total) by mouth daily before breakfast., Disp: 90 tablet, Rfl: 1   omeprazole (PRILOSEC) 20 MG capsule, TAKE 1 CAPSULE EVERY DAY, Disp: 90 capsule, Rfl: 0   simvastatin (ZOCOR) 20 MG tablet, TAKE 1 TABLET AT BEDTIME, Disp: 90 tablet, Rfl: 1   SYSTANE BALANCE 0.6 % SOLN, SMARTSIG:1 Drop(s) In Eye(s) PRN, Disp: , Rfl:    Travoprost, BAK Free, (TRAVATAN) 0.004 % SOLN ophthalmic solution, Place 1 drop into both eyes at bedtime. , Disp: , Rfl:   Current Facility-Administered Medications:    0.9 %  sodium chloride infusion, 500 mL, Intravenous, Once, Fuller Plan Pricilla Riffle, MD

## 2022-07-13 ENCOUNTER — Ambulatory Visit (INDEPENDENT_AMBULATORY_CARE_PROVIDER_SITE_OTHER): Payer: Medicare HMO | Admitting: Family Medicine

## 2022-07-13 ENCOUNTER — Encounter: Payer: Self-pay | Admitting: Family Medicine

## 2022-07-13 VITALS — BP 138/78 | HR 79 | Temp 97.8°F | Resp 16 | Ht 65.0 in | Wt 198.4 lb

## 2022-07-13 DIAGNOSIS — I10 Essential (primary) hypertension: Secondary | ICD-10-CM

## 2022-07-13 MED ORDER — LOSARTAN POTASSIUM 50 MG PO TABS: 50.0000 mg | ORAL_TABLET | Freq: Every day | ORAL | 3 refills | Status: DC

## 2022-07-13 NOTE — Patient Instructions (Signed)
Follow up in 3-4 weeks to recheck BP and labs No need for labs today- yay!!! START the Losartan once daily Continue to monitor home BPs Call with any questions or concerns Happy Fall!!!

## 2022-07-13 NOTE — Assessment & Plan Note (Signed)
New.  Pt has had multiple readings above goal.  HR WNL.  Will start low dose ARB to bring BP back into normal range.  Discussed appropriate use, timing, and possible side effects including angioedema.  Pt knows if she has any facial swelling she is to stop the medication and let me know.  Will have her return in 3-4 weeks to recheck BP and assess Cr and K+.  Pt expressed understanding and is in agreement w/ plan.

## 2022-07-13 NOTE — Progress Notes (Signed)
   Subjective:    Patient ID: [name] [name], female    DOB: [birth year], 71 y.o.   MRN: [id number]  HPI Elevated BP- pt reports home BPs have been 'up and down'.  Home BPs 120s-170s/60-96.  Pt reports when BP is elevated it's 'a funny sensation'.  'almost like it's going in slow motion'.  No HAs, dizziness, visual changes, edema.   Review of Systems For ROS see HPI     Objective:   Physical Exam Vitals reviewed.  Constitutional:      General: She is not in acute distress.    Appearance: Normal appearance. She is not ill-appearing.  HENT:     Head: Normocephalic and atraumatic.  Eyes:     Extraocular Movements: Extraocular movements intact.     Conjunctiva/sclera: Conjunctivae normal.     Pupils: Pupils are equal, round, and reactive to light.  Cardiovascular:     Rate and Rhythm: Normal rate and regular rhythm.     Pulses: Normal pulses.     Heart sounds: Normal heart sounds.  Pulmonary:     Effort: Pulmonary effort is normal. No respiratory distress.     Breath sounds: Normal breath sounds. No wheezing or rhonchi.  Musculoskeletal:     Right lower leg: No edema.     Left lower leg: No edema.  Skin:    General: Skin is warm and dry.  Neurological:     General: No focal deficit present.     Mental Status: She is alert and oriented to person, place, and time.  Psychiatric:        Mood and Affect: Mood normal.        Behavior: Behavior normal.        Thought Content: Thought content normal.           Assessment & Plan:

## 2022-07-14 ENCOUNTER — Ambulatory Visit: Payer: Medicare HMO | Admitting: Psychology

## 2022-07-14 ENCOUNTER — Encounter: Payer: Self-pay | Admitting: Psychology

## 2022-07-14 DIAGNOSIS — R251 Tremor, unspecified: Secondary | ICD-10-CM | POA: Diagnosis not present

## 2022-07-14 DIAGNOSIS — G3184 Mild cognitive impairment, so stated: Secondary | ICD-10-CM

## 2022-07-14 DIAGNOSIS — M810 Age-related osteoporosis without current pathological fracture: Secondary | ICD-10-CM | POA: Insufficient documentation

## 2022-07-14 DIAGNOSIS — F09 Unspecified mental disorder due to known physiological condition: Secondary | ICD-10-CM | POA: Insufficient documentation

## 2022-07-14 DIAGNOSIS — R4189 Other symptoms and signs involving cognitive functions and awareness: Secondary | ICD-10-CM

## 2022-07-14 HISTORY — DX: Mild cognitive impairment of uncertain or unknown etiology: G31.84

## 2022-07-14 NOTE — Progress Notes (Signed)
   Psychometrician Note   Cognitive testing was administered to [name] by Milana Kidney, B.S. (psychometrist) under the supervision of Dr. Christia Reading, Ph.D., licensed psychologist on 07/14/2022. Ms. Steffler did not appear overtly distressed by the testing session per behavioral observation or responses across self-report questionnaires. Rest breaks were offered.    The battery of tests administered was selected by Dr. Christia Reading, Ph.D. with consideration to Ms. Linden's current level of functioning, the nature of her symptoms, emotional and behavioral responses during interview, level of literacy, observed level of motivation/effort, and the nature of the referral question. This battery was communicated to the psychometrist. Communication between Dr. Christia Reading, Ph.D. and the psychometrist was ongoing throughout the evaluation and Dr. Christia Reading, Ph.D. was immediately accessible at all times. Dr. Christia Reading, Ph.D. provided supervision to the psychometrist on the date of this service to the extent necessary to assure the quality of all services provided.    [name] will return within approximately 1-2 weeks for an interactive feedback session with Dr. Melvyn Novas at which time her test performances, clinical impressions, and treatment recommendations will be reviewed in detail. Ms. Postlethwait understands she can contact our office should she require our assistance before this time.  A total of 170 minutes of billable time were spent face-to-face with Ms. Ailey by the psychometrist. This includes both test administration and scoring time. Billing for these services is reflected in the clinical report generated by Dr. Christia Reading, Ph.D.  This note reflects time spent with the psychometrician and does not include test scores or any clinical interpretations made by Dr. Melvyn Novas. The full report will follow in a separate note.

## 2022-07-14 NOTE — Progress Notes (Signed)
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    Writing 55 69 Average       Visuospatial/Visuoconstruction:      Raw Score Percentile   Clock Drawing: 10/10 --- Within Normal Limits       NAB Spatial Module, Form 1: T Score Percentile     Figure Drawing Copy 51 54 Average        Scaled Score Percentile   WAIS-IV Block Design: 11 63 Average       Mood and Personality:      Raw Score Percentile   Beck Depression Inventory - II: 11 --- Within Normal Limits  PROMIS Anxiety Questionnaire: 15 --- None to Slight       Additional Questionnaires:      Raw Score Percentile   PROMIS Sleep Disturbance Questionnaire: 14 --- None to Slight   Informed Consent and Coding/Compliance:   The current evaluation represents a clinical evaluation for the purposes previously outlined by the referral source and is in no way reflective of  a forensic evaluation.   Ms. Limbert was provided with a verbal description of the nature and purpose of the present neuropsychological evaluation. Also reviewed were the foreseeable risks and/or discomforts and benefits  of the procedure, limits of confidentiality, and mandatory reporting requirements of this provider. The patient was given the opportunity to ask questions and receive answers about the evaluation. Oral consent to participate was provided by the patient.   This evaluation was conducted by Christia Reading, Ph.D., ABPP-CN, board certified clinical neuropsychologist. Ms. Wilbanks completed a clinical interview with Dr. Melvyn Novas, billed as one unit [phone], and 170 minutes of cognitive testing and scoring, billed as one unit [phone] and five additional units [id number]. Psychometrist Milana Kidney, B.S., assisted Dr. Melvyn Novas with test administration and scoring procedures. As a separate and discrete service, Dr. Melvyn Novas spent a total of 160 minutes in interpretation and report writing billed as one unit [phone] and two units [id number].

## 2022-07-15 ENCOUNTER — Encounter: Payer: Self-pay | Admitting: Pulmonary Disease

## 2022-07-15 ENCOUNTER — Encounter: Payer: Self-pay | Admitting: Psychology

## 2022-07-15 ENCOUNTER — Ambulatory Visit (INDEPENDENT_AMBULATORY_CARE_PROVIDER_SITE_OTHER): Payer: Medicare HMO | Admitting: Pulmonary Disease

## 2022-07-15 VITALS — BP 116/68 | HR 75 | Ht 65.0 in | Wt 198.3 lb

## 2022-07-15 DIAGNOSIS — R0609 Other forms of dyspnea: Secondary | ICD-10-CM | POA: Diagnosis not present

## 2022-07-15 DIAGNOSIS — R918 Other nonspecific abnormal finding of lung field: Secondary | ICD-10-CM

## 2022-07-15 DIAGNOSIS — J309 Allergic rhinitis, unspecified: Secondary | ICD-10-CM | POA: Diagnosis not present

## 2022-07-15 NOTE — Addendum Note (Signed)
Addended by: Valerie Salts on: 07/15/2022 10:52 AM   Modules accepted: Orders

## 2022-07-15 NOTE — Patient Instructions (Signed)
Shortness of breath: Lung function test Check oximetry while walking in the office Start exercising more: Exercise is an important way to stay healthy, live longer, and have a better quality of life. You should dedicate time 3-5 days a week to intentional exercise. Start by planning a workout you know you can complete.  Once you have completed it try repeating it 2 more times in a week before you make it harder. The best and most effective exercise routines gradually get more difficult (the walk is longer, the run is faster, the weight is higher, etc.). Your consistency will guarantee success more than any other factor: stick with it no matter what.  No excuses. It's OK to feel short of breath when you exercise, but lightheadedness or pain is not OK.   If you are not comfortable exercising on your own let me know so we can find someone to help you.  Pulmonary nodules: Continue follow up with [facility]   Return to clinic in 4 to 6 weeks to go over the results of the lung function test.  If you are still having problems with shortness of breath at that point we can consider other testing (CPET, echo etc), however I think your symptoms will improve with exercise.

## 2022-07-16 ENCOUNTER — Other Ambulatory Visit: Payer: Self-pay

## 2022-07-16 DIAGNOSIS — K219 Gastro-esophageal reflux disease without esophagitis: Secondary | ICD-10-CM

## 2022-07-16 MED ORDER — OMEPRAZOLE 20 MG PO CPDR: 20.0000 mg | DELAYED_RELEASE_CAPSULE | Freq: Every day | ORAL | 0 refills | Status: DC

## 2022-07-19 DIAGNOSIS — Z129 Encounter for screening for malignant neoplasm, site unspecified: Secondary | ICD-10-CM | POA: Diagnosis not present

## 2022-07-19 DIAGNOSIS — L570 Actinic keratosis: Secondary | ICD-10-CM | POA: Diagnosis not present

## 2022-07-19 DIAGNOSIS — L300 Nummular dermatitis: Secondary | ICD-10-CM | POA: Diagnosis not present

## 2022-07-19 DIAGNOSIS — Z86008 Personal history of in-situ neoplasm of other site: Secondary | ICD-10-CM | POA: Diagnosis not present

## 2022-07-19 DIAGNOSIS — D225 Melanocytic nevi of trunk: Secondary | ICD-10-CM | POA: Diagnosis not present

## 2022-07-19 DIAGNOSIS — E669 Obesity, unspecified: Secondary | ICD-10-CM | POA: Diagnosis not present

## 2022-07-19 DIAGNOSIS — Z85820 Personal history of malignant melanoma of skin: Secondary | ICD-10-CM | POA: Diagnosis not present

## 2022-07-19 DIAGNOSIS — Z85828 Personal history of other malignant neoplasm of skin: Secondary | ICD-10-CM | POA: Diagnosis not present

## 2022-07-21 ENCOUNTER — Ambulatory Visit: Payer: Medicare HMO | Admitting: Psychology

## 2022-07-21 DIAGNOSIS — F09 Unspecified mental disorder due to known physiological condition: Secondary | ICD-10-CM

## 2022-07-21 DIAGNOSIS — R251 Tremor, unspecified: Secondary | ICD-10-CM

## 2022-07-21 NOTE — Progress Notes (Signed)
   Neuropsychology Feedback Session [name] [name]. [name] [name] Department of Neurology  Reason for Referral:   [name] [name] is a 71 y.o. right-handed Caucasian female referred by Alonza Bogus, D.O., to characterize her current cognitive functioning and assist with diagnostic clarity and treatment planning in the context of subjective cognitive decline.   Feedback:   [name] [name] completed a comprehensive neuropsychological evaluation on 07/14/2022. Please refer to that encounter for the full report and recommendations. Briefly, results suggested performance variability across executive functioning and delayed retrieval/recognition aspects of verbal memory. [name] [name] pattern across testing is nonspecific and does not strongly align with common neurodegenerative illnesses. Trouble with executive functioning and retrieval aspects of memory can be seen in individuals with essential tremor. It is also common in individuals with Parkinson's disease and primary vascular etiologies, as well as various medical conditions and biopsychosocial variables (e.g., chronic pain, headaches, psychiatric distress, acute testing anxiety, etc.). Of these etiologies, her clinical presentation per Dr. Carles Collet would favor essential tremor over Parkinson's disease. It may be that this, when combined with mild microvascular ischemic changes as seen across neuroimaging and currently untreated sleep apnea, represents the most likely cause for performance variability and subjective day-to-day dysfunction. Despite some variability across delayed retrieval memory tasks, retention rates ranged from 91% to 100% outside of one lower score across a daily living task. This does not suggest rapid forgetting or a storage deficit and is not concerning for Alzheimer's disease at the present time.   [name] [name] was accompanied by her husband during the current feedback session. Content of the current session focused on the results  of her neuropsychological evaluation. [name] [name] was given the opportunity to ask questions and her questions were answered. She was encouraged to reach out should additional questions arise. A copy of her report was provided at the conclusion of the visit.      30 minutes were spent conducting the current feedback session with [name] [name], billed as one unit [phone].

## 2022-08-06 ENCOUNTER — Encounter: Payer: Self-pay | Admitting: Family Medicine

## 2022-08-06 ENCOUNTER — Ambulatory Visit (INDEPENDENT_AMBULATORY_CARE_PROVIDER_SITE_OTHER): Payer: Medicare HMO | Admitting: Family Medicine

## 2022-08-06 VITALS — BP 130/76 | HR 77 | Temp 97.4°F | Resp 16 | Ht 65.0 in | Wt 200.4 lb

## 2022-08-06 DIAGNOSIS — I10 Essential (primary) hypertension: Secondary | ICD-10-CM | POA: Diagnosis not present

## 2022-08-06 LAB — BASIC METABOLIC PANEL
BUN: 18 mg/dL (ref 6–23)
CO2: 30 mEq/L (ref 19–32)
Calcium: 9.6 mg/dL (ref 8.4–10.5)
Chloride: 101 mEq/L (ref 96–112)
Creatinine, Ser: 0.83 mg/dL (ref 0.40–1.20)
GFR: 71.29 mL/min (ref >60.00)
Glucose, Bld: 114 mg/dL — ABNORMAL HIGH (ref 70–99)
Potassium: 4.7 mEq/L (ref 3.5–5.1)
Sodium: 138 mEq/L (ref 135–145)

## 2022-08-06 NOTE — Progress Notes (Signed)
   Subjective:    Patient ID: [name] [name], female    DOB: 01-04-1951, 71 y.o.   MRN: [id number]  HPI HTN- at last visit pt was started on Losartan '50mg'$  daily.  Pt reports feeling good.  No reported side effects.  No CP, SOB, HA's, visual changes, edema.   Review of Systems For ROS see HPI     Objective:   Physical Exam Vitals reviewed.  Constitutional:      General: She is not in acute distress.    Appearance: Normal appearance. She is well-developed. She is not ill-appearing.  HENT:     Head: Normocephalic and atraumatic.  Eyes:     Conjunctiva/sclera: Conjunctivae normal.     Pupils: Pupils are equal, round, and reactive to light.  Neck:     Thyroid: No thyromegaly.  Cardiovascular:     Rate and Rhythm: Normal rate and regular rhythm.     Pulses: Normal pulses.     Heart sounds: Normal heart sounds. No murmur heard. Pulmonary:     Effort: Pulmonary effort is normal. No respiratory distress.     Breath sounds: Normal breath sounds.  Abdominal:     General: There is no distension.     Palpations: Abdomen is soft.     Tenderness: There is no abdominal tenderness.  Musculoskeletal:     Cervical back: Normal range of motion and neck supple.     Right lower leg: No edema.     Left lower leg: No edema.  Lymphadenopathy:     Cervical: No cervical adenopathy.  Skin:    General: Skin is warm and dry.  Neurological:     Mental Status: She is alert and oriented to person, place, and time.  Psychiatric:        Behavior: Behavior normal.           Assessment & Plan:

## 2022-08-06 NOTE — Assessment & Plan Note (Signed)
BP has improved w/ addition of Losartan.  She has no side effects from medication and reports feeling well.  Check BMP to assess K+ and Cr w/ the addition of ARB, but no anticipated med changes.  Will follow.

## 2022-08-06 NOTE — Patient Instructions (Signed)
Schedule your physical in Feb We'll notify you of your lab results and make any changes if needed Continue the Losartan daily Drink lots of water and lower your salt intake to keep your blood pressure in check Call with any questions or concerns Happy Holidays!!

## 2022-08-09 NOTE — Progress Notes (Signed)
Informed pt of lab results  

## 2022-08-10 ENCOUNTER — Ambulatory Visit: Payer: Medicare HMO | Admitting: Neurology

## 2022-08-17 ENCOUNTER — Other Ambulatory Visit: Payer: Self-pay | Admitting: Family Medicine

## 2022-08-23 ENCOUNTER — Encounter: Payer: Self-pay | Admitting: Physician Assistant

## 2022-08-23 ENCOUNTER — Ambulatory Visit: Payer: Medicare HMO | Admitting: Physician Assistant

## 2022-08-23 VITALS — BP 148/85 | HR 86 | Resp 18 | Ht 65.0 in | Wt 202.0 lb

## 2022-08-23 DIAGNOSIS — R413 Other amnesia: Secondary | ICD-10-CM

## 2022-08-23 DIAGNOSIS — G4730 Sleep apnea, unspecified: Secondary | ICD-10-CM

## 2022-08-23 NOTE — Patient Instructions (Signed)
.  Follow up in 6 months  Referral for sleep study for OSA

## 2022-08-23 NOTE — Progress Notes (Signed)
Assessment/Plan:   Memory Difficulties   [name] [name] is a very pleasant 71 y.o. RH female presenting today in follow-up for evaluation of memory loss. MRI of the brain on April 2023 personally reviewed was remarkable for mild small chronic vessel disease, with and mild/moderate atrophy.  Neuropsych evaluation on 07/21/2022, Dr. Melvyn Novas suggested a diagnosis of essential tremor over Parkinson's disease mild cognitive impairment,  when combined with mild chronic microvascular disease and untreated sleep apnea, represents the most likely cause of performance by her ability and subjective data today function.  The studies did not suggest rapid forgetting or storage deficit, and is not concerning for Alzheimer's disease at the present time.  MMSE today is 30/30, and has been since 2019.       Recommendations:   Follow up in 6  months. No antidementia medication is indicated at this time medication is indicated at this time. Continue to control cardiovascular risk factors Continue to control mood as per PCP Repeat neuropsychological evaluation in 18 to 24 months for clarity of the diagnosis and disease progression Recommend sleep studies for sleep apnea Follow-up with neurosurgery for C5 and C7 radiculopathy    Subjective:   This patient is here alone.  Previous records as well as any outside records available were reviewed prior to todays visit.      Any changes in memory since last visit?  She denies any significant changes. Sometimes she reports " can't grasp the work until I pinpoint it more". She had been referred to [place] in 12/08/2021 for "expressive aphasia ", she was seen by Dr. Carles Collet, described as word finding trouble  such as not inserting the right word in the sentence.    repeats oneself?  Denies  Disoriented when walking into a room?  Patient denies   Leaving objects in unusual places?  Endorsed, she may not be able to find the phone, finding it in a short period of  time. Ambulates  with difficulty?   Patient denies   Recent falls?  Patient denies   Any head injuries?  In the early 1980s, she hit her head and fell with a brief LOC, without any recent events noted. History of seizures?   Patient denies   Wandering behavior?  Patient denies   Patient drives?  Endorsed, no issues Any mood changes since last visit?  Patient denies   Any worsening depression?:  Patient denies   Hallucinations?  Patient denies   Paranoia?  Patient denies   Patient reports that sleeps well "sometimes vivid dreams like something following me, but that is rare", REM behavior or sleepwalking   History of sleep apnea?  Endorsed "a long time ago", but not currently on CPAP Any hygiene concerns?  Patient denies   Independent of bathing and dressing?  Endorsed  Does the patient needs help with medications? Patient is in charge  Who is in charge of the finances?  [employer]  is in charge    Any changes in appetite?  Patient denies    Patient have trouble swallowing? Patient denies   Does the patient cook? Endorsed    Any kitchen accidents such as leaving the stove on? Patient denies   Any headaches?  Patient denies   Double vision? Patient denies   Any focal numbness or tingling?  Patient denies   Chronic back pain Patient denies but has chronic back pain, received injection by neurosurgery 12/2021 with some relief   Unilateral weakness?  Patient denies   Any tremors?  B,  minimal, no changes.   Any history of anosmia?  Patient denies   Any incontinence of urine?  Patient denies   Any bowel dysfunction?   Patient denies      Patient lives with her husband  Retired event planned at the [employer]   Past Medical History:  Diagnosis Date   Allergy 12/19/2007   Bronchitis    Cataract    Cognitive dysfunction 07/14/2022   Coronary artery calcification seen on CAT scan 04/09/2021   DJD (degenerative joint disease)    DOE (dyspnea on exertion) 04/09/2021   GERD  (gastroesophageal reflux disease) 12/19/2007   Glaucoma    History of adenomatous colonic polyps 01/17/2017   Hypercholesterolemia    Hypothyroidism    IBS (irritable bowel syndrome)    Malignant melanoma of neck    Mixed hyperlipidemia 04/09/2021   OSA (obstructive sleep apnea) 12/27/2017   no CPAP   Osteoporosis    Plantar fasciitis    Postoperative nausea and vomiting 09/02/2015   Precordial pain 04/09/2021   Presence of shoulder implant 01/21/2021   Primary hypertension 12/19/2007   Primary localized osteoarthritis of left knee    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/21/2021   Tremor of both hands 05/21/2020   Vitamin D deficiency 09/21/2009     Past Surgical History:  Procedure Laterality Date   ANAL RECTAL MANOMETRY N/A 11/16/2019   Procedure: ANO RECTAL MANOMETRY;  Surgeon: Mauri Pole, MD;  Location: [facility];  Service: Endoscopy;  Laterality: N/A;   BUNIONECTOMY  2004   left   CATARACT EXTRACTION W/ INTRAOCULAR LENS  IMPLANT, BILATERAL     COLONOSCOPY     EPIDURAL CATHETER INSERTION     L7   FOOT OSTEOTOMY  2003   right   HERNIA REPAIR  1957   JOINT REPLACEMENT     KNEE ARTHROSCOPY  1987&2003   left   MELANOMA EXCISION Right    shoulder   POLYPECTOMY     REVERSE SHOULDER ARTHROPLASTY Right 01/21/2021   Procedure: REVERSE SHOULDER ARTHROPLASTY;  Surgeon: Hiram Gash, MD;  Location: [facility];  Service: Orthopedics;  Laterality: Right;   SHOULDER ARTHROSCOPY WITH OPEN ROTATOR CUFF REPAIR  2013   right   SKIN SURGERY  01/2004   Melanoma surg left shoulder   TOENAIL EXCISION  10/04/2017   TOTAL KNEE ARTHROPLASTY Left 09/01/2015   Procedure: TOTAL KNEE ARTHROPLASTY;  Surgeon: Elsie Saas, MD;  Location: [facility];  Service: Orthopedics;  Laterality: Left;   TUBAL LIGATION  1990     PREVIOUS MEDICATIONS:   CURRENT MEDICATIONS:  Outpatient Encounter Medications as of 08/23/2022  Medication Sig   aspirin 325 MG tablet  Take 325 mg by mouth daily.   B Complex-C (SUPER B COMPLEX PO) Take 1 tablet by mouth daily.    brimonidine (ALPHAGAN) 0.2 % ophthalmic solution 1 drop 3 (three) times daily.   cetirizine (ZYRTEC) 10 MG tablet Take 10 mg by mouth daily.   Cholecalciferol (VITAMIN D3) 5000 UNITS CAPS Take 5,000 Units by mouth daily.    Coenzyme Q10 (COQ10) 200 MG CAPS Take 1 capsule by mouth daily.    doxycycline (ADOXA) 50 MG tablet Take 50 mg by mouth daily. For rosacea   losartan (COZAAR) 50 MG tablet Take 1 tablet (50 mg total) by mouth daily.   Magnesium 200 MG TABS Take 200 mg by mouth in the morning and at bedtime.    Multiple Vitamins-Minerals (MULTIVITAMIN PO) Take 1  tablet by mouth daily.   NP THYROID 60 MG tablet TAKE 1 TABLET(60 MG) BY MOUTH DAILY BEFORE BREAKFAST   omeprazole (PRILOSEC) 20 MG capsule Take 1 capsule (20 mg total) by mouth daily.   simvastatin (ZOCOR) 20 MG tablet TAKE 1 TABLET AT BEDTIME   SYSTANE BALANCE 0.6 % SOLN SMARTSIG:1 Drop(s) In Eye(s) PRN   Travoprost, BAK Free, (TRAVATAN) 0.004 % SOLN ophthalmic solution Place 1 drop into both eyes at bedtime.    Facility-Administered Encounter Medications as of 08/23/2022  Medication   0.9 %  sodium chloride infusion     Objective:     PHYSICAL EXAMINATION:    VITALS:   Vitals:   08/23/22 1055  BP: (!) 148/85  Pulse: 86  Resp: 18  SpO2: 96%  Weight: 202 lb (91.6 kg)  Height: '5\' 5"'$  (1.651 m)    GEN:  The patient appears stated age and is in NAD. HEENT:  Normocephalic, atraumatic.   Neurological examination:  General: NAD, well-groomed, appears stated age. Orientation: The patient is alert. Oriented to person, place and date Cranial nerves: There is good facial symmetry.The speech is fluent and clear. No aphasia or dysarthria. Fund of knowledge is appropriate. Recent remote memory is normal.  Attention and concentration are normal.  Able to name objects and repeat phrases.  Hearing is intact to conversational tone.     Sensation: Sensation is intact to light touch throughout Motor: Strength is at least antigravity x4. DTR's 2/4 in UE/LE       No data to display             08/23/2022    5:00 PM 11/15/2018   10:05 AM 11/09/2017    9:35 AM  MMSE - Mini Mental State Exam  Orientation to time '5 5 5  '$ Orientation to Place '5 5 5  '$ Registration '3 3 3  '$ Attention/ Calculation '5 5 5  '$ Recall '3 3 3  '$ Language- name 2 objects '2 2 2  '$ Language- repeat '1 1 1  '$ Language- follow 3 step command '3 3 3  '$ Language- read & follow direction '1 1 1  '$ Write a sentence '1 1 1  '$ Copy design '1 1 1  '$ Total score '30 30 30       '$ Movement examination: Tone: There is normal tone in the UE/LE Abnormal movements:  minimal intention tremor UE .  No myoclonus.  No asterixis.   Coordination:  There is no decremation with RAM's. Normal finger to nose  Gait and Station: The patient has no difficulty arising out of a deep-seated chair without the use of the hands. The patient's stride length is good.  Gait is cautious and narrow.   Thank you for allowing [place] the opportunity to participate in the care of this nice patient. Please do not hesitate to contact us for any questions or concerns.   Total time spent on today's visit was 30 minutes dedicated to this patient today, preparing to see patient, examining the patient, ordering tests and/or medications and counseling the patient, documenting clinical information in the EHR or other health record, independently interpreting results and communicating results to the patient/family, discussing treatment and goals, answering patient's questions and coordinating care.  Cc:  Midge Minium, MD  Sharene Butters 08/23/2022 5:30 PM

## 2022-09-01 ENCOUNTER — Ambulatory Visit: Payer: Medicare HMO | Admitting: Pulmonary Disease

## 2022-09-01 ENCOUNTER — Ambulatory Visit (INDEPENDENT_AMBULATORY_CARE_PROVIDER_SITE_OTHER): Payer: Medicare HMO | Admitting: Pulmonary Disease

## 2022-09-01 ENCOUNTER — Encounter: Payer: Self-pay | Admitting: Pulmonary Disease

## 2022-09-01 VITALS — BP 130/74 | HR 73 | Temp 97.8°F | Ht 65.5 in | Wt 199.0 lb

## 2022-09-01 DIAGNOSIS — R918 Other nonspecific abnormal finding of lung field: Secondary | ICD-10-CM

## 2022-09-01 DIAGNOSIS — R0609 Other forms of dyspnea: Secondary | ICD-10-CM

## 2022-09-01 DIAGNOSIS — R400 Somnolence: Secondary | ICD-10-CM

## 2022-09-01 LAB — PULMONARY FUNCTION TEST
DL/VA % pred: 85 %
DL/VA: 3.48 ml/min/mmHg/L
DLCO cor % pred: 96 %
DLCO cor: 19.83 ml/min/mmHg
DLCO unc % pred: 96 %
DLCO unc: 19.83 ml/min/mmHg
FEF 25-75 Post: 2.02 L/sec
FEF 25-75 Pre: 1.51 L/sec
FEF2575-%Change-Post: 33 %
FEF2575-%Pred-Post: 101 %
FEF2575-%Pred-Pre: 75 %
FEV1-%Change-Post: 9 %
FEV1-%Pred-Post: 106 %
FEV1-%Pred-Pre: 97 %
FEV1-Post: 2.56 L
FEV1-Pre: 2.34 L
FEV1FVC-%Change-Post: 5 %
FEV1FVC-%Pred-Pre: 90 %
FEV6-%Change-Post: 5 %
FEV6-%Pred-Post: 116 %
FEV6-%Pred-Pre: 110 %
FEV6-Post: 3.54 L
FEV6-Pre: 3.37 L
FEV6FVC-%Change-Post: 1 %
FEV6FVC-%Pred-Post: 104 %
FEV6FVC-%Pred-Pre: 103 %
FVC-%Change-Post: 3 %
FVC-%Pred-Post: 111 %
FVC-%Pred-Pre: 107 %
FVC-Post: 3.54 L
FVC-Pre: 3.41 L
Post FEV1/FVC ratio: 72 %
Post FEV6/FVC ratio: 100 %
Pre FEV1/FVC ratio: 69 %
Pre FEV6/FVC Ratio: 99 %
RV % pred: 79 %
RV: 1.8 L
TLC % pred: 104 %
TLC: 5.5 L

## 2022-09-01 NOTE — Patient Instructions (Signed)
Physical deconditioning: I strongly recommend that she start a regular exercise routine Today's lung function testing showed no evidence of underlying lung disease  Snoring, daytime somnolence: Home sleep study No driving while sleepy  Pulmonary nodules: Keep plans for repeat CT scan of the chest in September 2024 through the [facility]  We will see you back on an as-needed basis, if the sleep study shows significant obstructive sleep apnea then we will schedule a follow-up visit.  Otherwise, at this time I see no evidence of underlying lung disease and recommend a regular exercise routine.

## 2022-09-01 NOTE — Progress Notes (Signed)
Full PFT performed today. °

## 2022-09-01 NOTE — Patient Instructions (Signed)
Full PFT performed today. °

## 2022-09-01 NOTE — Progress Notes (Signed)
Synopsis: Referred in October 2023 for dyspnea.   Subjective:   PATIENT ID: [name] [name] GENDER: female DOB: 10/29/1950, MRN: [id number]   HPI  Chief Complaint  Patient presents with   Follow-up    Pft results, pt wants to discuss sleep study    Dyspnea: > she says that she really hasn't had any problems with dyspnea > she hasn't started an exercise > she has been really active with visiting friends > she doesn't feel short of breath  She says that her husband has told her that she quits breathing at night and has a lot of snoring.  She will fall asleep watching television.  She doesn't nap.  In general feels well rested in the mornings, occassionally doesn't feel well rested.   Past Medical History:  Diagnosis Date   Allergy 12/19/2007   Bronchitis    Cataract    Cognitive dysfunction 07/14/2022   Coronary artery calcification seen on CAT scan 04/09/2021   DJD (degenerative joint disease)    DOE (dyspnea on exertion) 04/09/2021   GERD (gastroesophageal reflux disease) 12/19/2007   Glaucoma    History of adenomatous colonic polyps 01/17/2017   Hypercholesterolemia    Hypothyroidism    IBS (irritable bowel syndrome)    Malignant melanoma of neck    Mixed hyperlipidemia 04/09/2021   OSA (obstructive sleep apnea) 12/27/2017   no CPAP   Osteoporosis    Plantar fasciitis    Postoperative nausea and vomiting 09/02/2015   Precordial pain 04/09/2021   Presence of shoulder implant 01/21/2021   Primary hypertension 12/19/2007   Primary localized osteoarthritis of left knee    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/21/2021   Tremor of both hands 05/21/2020   Vitamin D deficiency 09/21/2009       ROS    Objective:  Physical Exam   Vitals:   09/01/22 1010  BP: 130/74  Pulse: 73  Temp: 97.8 F (36.6 C)  TempSrc: Oral  SpO2: 96%  Weight: 199 lb (90.3 kg)  Height: 5' 5.5" (1.664 m)    RA  Gen: well appearing HENT: OP clear, neck  supple PULM: CTA B, normal effort  CV: RRR, no mgr GI: BS+, soft, nontender Derm: no cyanosis or rash Psyche: normal mood and affect    CBC    Component Value Date/Time   WBC 6.7 06/17/2022 1352   RBC 4.76 06/17/2022 1352   HGB 14.0 06/17/2022 1352   HCT 41.7 06/17/2022 1352   PLT 255.0 06/17/2022 1352   MCV 87.6 06/17/2022 1352   MCH 28.6 02/18/2017 1552   MCHC 33.6 06/17/2022 1352   RDW 15.5 06/17/2022 1352   LYMPHSABS 2.4 06/17/2022 1352   MONOABS 0.6 06/17/2022 1352   EOSABS 0.1 06/17/2022 1352   BASOSABS 0.0 06/17/2022 1352     Chest imaging: 06/2022 CT chest > 57m pulm nodule in LUL,, 429mnodule RML. Stable scarring in RUL, images independently reviewed and there is a bandlike area of atelectasis in the right upper lobe, medial border, however this is small, no surrounding groundglass or other consolidation findings.  PFT: November 2023 full PFT ratio 72%, FEV1 2.34 L 97% predicted, 9% change with bronchodilator, total lung capacity 5.50 L 104% predicted, DLCO 19.83 96% predicted  Labs: September 2023 hemoglobin 14.1 g/dL  Path:  Echo:  Heart Catheterization:  Stress test 04/2019 myoview: The left ventricular ejection fraction is hyperdynamic (>65%). Nuclear stress EF: 68%.  There was no ST segment deviation noted during stress. The  study is normal. There are no perfusion defects suggestive of ischemia or infarction. This is a low risk study.     Assessment & Plan:   DOE (dyspnea on exertion)  Pulmonary nodules  Daytime somnolence  Discussion: Stable interval for Jocelyn Lamer.  There is no evidence of underlying lung disease.  Lung function testing is normal.  She remains physically deconditioned and has yet to start an exercise routine per my recommendation.  Plan: Physical deconditioning: I strongly recommend that she start a regular exercise routine Today's lung function testing showed no evidence of underlying lung disease  Snoring, daytime  somnolence: Home sleep study No driving while sleepy  Pulmonary nodules: Keep plans for repeat CT scan of the chest in September 2024 through the [facility]  We will see you back on an as-needed basis, if the sleep study shows significant obstructive sleep apnea then we will schedule a follow-up visit.  Otherwise, at this time I see no evidence of underlying lung disease and recommend a regular exercise routine.   Immunizations: Immunization History  Administered Date(s) Administered   Fluad Quad(high Dose 65+) 06/04/2019, 07/02/2021, 07/16/2021, 06/17/2022   Influenza Split 08/04/2012   Influenza, High Dose Seasonal PF 06/12/2018, 06/03/2020   Influenza, Seasonal, Injecte, Preservative Fre 06/04/2019   Influenza,inj,Quad PF,6+ Mos 11/22/2014, 07/01/2015, 06/04/2016, 07/25/2017   Influenza-Unspecified 07/03/2021   PFIZER(Purple Top)SARS-COV-2 Vaccination 10/26/2019, 11/13/2019, 06/19/2020, 02/11/2021   PNEUMOCOCCAL CONJUGATE-20 05/20/2021   Pfizer Covid-19 Vaccine Bivalent Booster 15yr & up 07/16/2021   Pneumococcal Conjugate-13 05/17/2018   Tdap 10/01/2011, 05/20/2021   Zoster Recombinat (Shingrix) 07/25/2017, 12/22/2017   Zoster, Live 12/03/2015     Current Outpatient Medications:    aspirin 325 MG tablet, Take 325 mg by mouth daily., Disp: , Rfl:    B Complex-C (SUPER B COMPLEX PO), Take 1 tablet by mouth daily. , Disp: , Rfl:    brimonidine (ALPHAGAN) 0.2 % ophthalmic solution, 1 drop 3 (three) times daily., Disp: , Rfl:    cetirizine (ZYRTEC) 10 MG tablet, Take 10 mg by mouth daily., Disp: , Rfl:    Cholecalciferol (VITAMIN D3) 5000 UNITS CAPS, Take 5,000 Units by mouth daily. , Disp: , Rfl:    Coenzyme Q10 (COQ10) 200 MG CAPS, Take 1 capsule by mouth daily. , Disp: , Rfl:    doxycycline (ADOXA) 50 MG tablet, Take 50 mg by mouth daily. For rosacea, Disp: , Rfl:    losartan (COZAAR) 50 MG tablet, Take 1 tablet (50 mg total) by mouth daily., Disp: 30  tablet, Rfl: 3   Magnesium 200 MG TABS, Take 200 mg by mouth in the morning and at bedtime. , Disp: , Rfl:    Multiple Vitamins-Minerals (MULTIVITAMIN PO), Take 1 tablet by mouth daily., Disp: , Rfl:    NP THYROID 60 MG tablet, TAKE 1 TABLET(60 MG) BY MOUTH DAILY BEFORE BREAKFAST, Disp: 90 tablet, Rfl: 1   omeprazole (PRILOSEC) 20 MG capsule, Take 1 capsule (20 mg total) by mouth daily., Disp: 90 capsule, Rfl: 0   simvastatin (ZOCOR) 20 MG tablet, TAKE 1 TABLET AT BEDTIME, Disp: 90 tablet, Rfl: 1   SYSTANE BALANCE 0.6 % SOLN, SMARTSIG:1 Drop(s) In Eye(s) PRN, Disp: , Rfl:    Travoprost, BAK Free, (TRAVATAN) 0.004 % SOLN ophthalmic solution, Place 1 drop into both eyes at bedtime. , Disp: , Rfl:   Current Facility-Administered Medications:    0.9 %  sodium chloride infusion, 500 mL, Intravenous, Once, SFuller PlanMPricilla Riffle MD

## 2022-09-01 NOTE — Addendum Note (Signed)
Addended by: Loma Sousa on: 09/01/2022 10:31 AM   Modules accepted: Orders

## 2022-09-17 ENCOUNTER — Encounter (HOSPITAL_BASED_OUTPATIENT_CLINIC_OR_DEPARTMENT_OTHER): Payer: Medicare HMO | Admitting: Internal Medicine

## 2022-10-10 ENCOUNTER — Other Ambulatory Visit: Payer: Self-pay | Admitting: Family Medicine

## 2022-10-10 DIAGNOSIS — K219 Gastro-esophageal reflux disease without esophagitis: Secondary | ICD-10-CM

## 2022-10-11 ENCOUNTER — Telehealth: Payer: Self-pay | Admitting: Anesthesiology

## 2022-10-11 NOTE — Telephone Encounter (Signed)
Reference [id number] no prior authorization is needed.

## 2022-10-11 NOTE — Telephone Encounter (Signed)
Pt called back in stating she has to know something today in order to not have to pay a $200 cancellation fee.

## 2022-10-11 NOTE — Telephone Encounter (Signed)
Pt called stating she needs an authorization from Sharene Butters sent to [facility] that says it is medically necessary for her to have a Sleep Apnea Study done. The referral number [id number] Code is [phone]. Pt's appointment is on 10/13/22.  Pt requests call back

## 2022-10-12 ENCOUNTER — Other Ambulatory Visit: Payer: Self-pay | Admitting: Family Medicine

## 2022-10-13 ENCOUNTER — Ambulatory Visit (HOSPITAL_BASED_OUTPATIENT_CLINIC_OR_DEPARTMENT_OTHER): Payer: Medicare HMO | Attending: Physician Assistant | Admitting: Internal Medicine

## 2022-10-13 VITALS — Ht 65.0 in | Wt 195.0 lb

## 2022-10-13 DIAGNOSIS — G4733 Obstructive sleep apnea (adult) (pediatric): Secondary | ICD-10-CM | POA: Diagnosis not present

## 2022-10-13 DIAGNOSIS — G4730 Sleep apnea, unspecified: Secondary | ICD-10-CM

## 2022-10-23 DIAGNOSIS — G4730 Sleep apnea, unspecified: Secondary | ICD-10-CM | POA: Diagnosis not present

## 2022-10-23 NOTE — Procedures (Signed)
    Patient Name: [name] [name], [name] [name] Date: 10/14/2022 Gender: Female D.O.B: 12-31-1950 Age (years): 37 Referring Provider: Rondel Jumbo PA-C Height (inches): 8 Interpreting Physician: Baird Lyons MD, ABSM Weight (lbs): 202 RPSGT: Gerhard Perches BMI: 34 MRN: [id number] Neck Size: 14.50  CLINICAL INFORMATION Sleep Study Type: HST Indication for sleep study: OSA Epworth Sleepiness Score: 2  SLEEP STUDY TECHNIQUE A multi-channel overnight portable sleep study was performed. The channels recorded were: nasal airflow, thoracic respiratory movement, and oxygen saturation with a pulse oximetry. Snoring was also monitored.  MEDICATIONS Patient self administered medications include: none reported.  SLEEP ARCHITECTURE Patient was studied for 359.7 minutes. The sleep efficiency was 100.0 % and the patient was supine for 0%. The arousal index was 0.0 per hour.  RESPIRATORY PARAMETERS The overall AHI was 11.8 per hour, with a central apnea index of 0.2 per hour. The oxygen nadir was 87% during sleep.  CARDIAC DATA Mean heart rate during sleep was 65.4 bpm.  IMPRESSIONS - Mild obstructive sleep apnea occurred during this study (AHI = 11.8/h). - No significant central sleep apnea occurred during this study (CAI = 0.2/h). - Mild oxygen desaturation was noted during this study (Min O2 = 87%). Mean 95% - Patient snored.  DIAGNOSIS - Obstructive Sleep Apnea (G47.33)  RECOMMENDATIONS - Treatment for mild OSA is guided by symptoms and co-morbidity. Conservative measures may include observation, weight loss, and sleep position off back. Other measures, including CPAP, fitted oral appliance or ENT evaluation, would be based on clinical judgment. - Be careful with alcohol, sedatives and other CNS depressants that may worsen sleep apnea and disrupt normal sleep architecture. - Sleep hygiene should be reviewed to assess factors that may improve sleep quality. - Weight management and  regular exercise should be initiated or continued.  [Electronically signed] 10/23/2022 12:40 PM  Baird Lyons MD, [place], American Board of Sleep Medicine NPI: [id number]                         [place], [place] [facility]  ELECTRONICALLY SIGNED ON:  10/23/2022, 12:36 PM [place] PH: [phone]   FX: [phone] [place]

## 2022-10-28 DIAGNOSIS — H18413 Arcus senilis, bilateral: Secondary | ICD-10-CM | POA: Diagnosis not present

## 2022-10-28 DIAGNOSIS — H43813 Vitreous degeneration, bilateral: Secondary | ICD-10-CM | POA: Diagnosis not present

## 2022-10-28 DIAGNOSIS — Z961 Presence of intraocular lens: Secondary | ICD-10-CM | POA: Diagnosis not present

## 2022-10-28 DIAGNOSIS — H401132 Primary open-angle glaucoma, bilateral, moderate stage: Secondary | ICD-10-CM | POA: Diagnosis not present

## 2022-11-17 DIAGNOSIS — Z129 Encounter for screening for malignant neoplasm, site unspecified: Secondary | ICD-10-CM | POA: Diagnosis not present

## 2022-11-17 DIAGNOSIS — Z85820 Personal history of malignant melanoma of skin: Secondary | ICD-10-CM | POA: Diagnosis not present

## 2022-11-17 DIAGNOSIS — Z86008 Personal history of in-situ neoplasm of other site: Secondary | ICD-10-CM | POA: Diagnosis not present

## 2022-11-17 DIAGNOSIS — L718 Other rosacea: Secondary | ICD-10-CM | POA: Diagnosis not present

## 2022-11-17 DIAGNOSIS — L300 Nummular dermatitis: Secondary | ICD-10-CM | POA: Diagnosis not present

## 2022-11-17 DIAGNOSIS — D225 Melanocytic nevi of trunk: Secondary | ICD-10-CM | POA: Diagnosis not present

## 2022-11-17 DIAGNOSIS — L570 Actinic keratosis: Secondary | ICD-10-CM | POA: Diagnosis not present

## 2022-11-17 DIAGNOSIS — Z85828 Personal history of other malignant neoplasm of skin: Secondary | ICD-10-CM | POA: Diagnosis not present

## 2022-11-17 DIAGNOSIS — L578 Other skin changes due to chronic exposure to nonionizing radiation: Secondary | ICD-10-CM | POA: Diagnosis not present

## 2022-11-24 ENCOUNTER — Ambulatory Visit (INDEPENDENT_AMBULATORY_CARE_PROVIDER_SITE_OTHER): Payer: Medicare HMO | Admitting: Family Medicine

## 2022-11-24 ENCOUNTER — Encounter: Payer: Self-pay | Admitting: Family Medicine

## 2022-11-24 VITALS — BP 124/72 | HR 61 | Temp 97.8°F | Resp 16 | Ht 65.0 in | Wt 199.5 lb

## 2022-11-24 DIAGNOSIS — E559 Vitamin D deficiency, unspecified: Secondary | ICD-10-CM

## 2022-11-24 DIAGNOSIS — M1711 Unilateral primary osteoarthritis, right knee: Secondary | ICD-10-CM | POA: Diagnosis not present

## 2022-11-24 DIAGNOSIS — Z0000 Encounter for general adult medical examination without abnormal findings: Secondary | ICD-10-CM | POA: Insufficient documentation

## 2022-11-24 DIAGNOSIS — I10 Essential (primary) hypertension: Secondary | ICD-10-CM | POA: Diagnosis not present

## 2022-11-24 LAB — CBC WITH DIFFERENTIAL/PLATELET
Basophils Absolute: 0 10*3/uL (ref 0.0–0.1)
Basophils Relative: 0.6 % (ref 0.0–3.0)
Eosinophils Absolute: 0.1 10*3/uL (ref 0.0–0.7)
Eosinophils Relative: 2 % (ref 0.0–5.0)
HCT: 43.3 % (ref 36.0–46.0)
Hemoglobin: 14.9 g/dL (ref 12.0–15.0)
Lymphocytes Relative: 33.2 % (ref 12.0–46.0)
Lymphs Abs: 2 10*3/uL (ref 0.7–4.0)
MCHC: 34.4 g/dL (ref 30.0–36.0)
MCV: 93.7 fl (ref 78.0–100.0)
Monocytes Absolute: 0.4 10*3/uL (ref 0.1–1.0)
Monocytes Relative: 6.7 % (ref 3.0–12.0)
Neutro Abs: 3.4 10*3/uL (ref 1.4–7.7)
Neutrophils Relative %: 57.5 % (ref 43.0–77.0)
Platelets: 236 10*3/uL (ref 150.0–400.0)
RBC: 4.62 Mil/uL (ref 3.87–5.11)
RDW: 13.1 % (ref 11.5–15.5)
WBC: 6 10*3/uL (ref 4.0–10.5)

## 2022-11-24 LAB — VITAMIN D 25 HYDROXY (VIT D DEFICIENCY, FRACTURES): VITD: 57.35 ng/mL (ref 30.00–100.00)

## 2022-11-24 LAB — TSH: TSH: 2.46 u[IU]/mL (ref 0.35–5.50)

## 2022-11-24 NOTE — Patient Instructions (Signed)
Follow up in 3 months to recheck weight loss progress We'll notify you of your lab results and make any changes if needed Try and get at least 30 minutes of physical activity each day- you deserve it!! Try and find a partner or class that will hold you accountable to the exercise Try and limit any liquid calories- soda, sweet tea, alcohol The goal is to try and eat food in its natural state as much as possible Limit processed carbs whenever possible Continue to drink LOTS of water Call with any questions or concerns HAPPY BIRTHDAY!!!

## 2022-11-24 NOTE — Assessment & Plan Note (Signed)
Chronic problem.  Excellent control today.  Currently asymptomatic.  Check labs but no anticipated med changes.

## 2022-11-24 NOTE — Progress Notes (Signed)
Subjective:    Patient ID: [name] [name], female    DOB: Apr 30, 1951, 72 y.o.   MRN: [id number]  HPI CPE- UTD on colonoscopy, mammo, Tdap, flu, PNA, DEXA  Patient Care Team    Relationship Specialty Notifications Start End  Midge Minium, MD PCP - General Family Medicine  11/22/14   Berniece Salines, DO PCP - Cardiology Cardiology  04/09/21   Roque Cash., MD Consulting Physician Obstetrics and Gynecology  05/29/15   Hollar, Katharine Look, MD Referring Physician Dermatology  05/29/15   Elsie Saas, MD Consulting Physician Orthopedic Surgery  05/29/15   Wallene Huh, [place] Consulting Physician Podiatry  05/29/15   Janann August, MD Referring Physician Dermatology  11/09/17   Darleen Crocker, MD Consulting Physician Ophthalmology  11/15/18   Edythe Clarity, [facility] Pharmacist Pharmacist  11/30/21    Comment: [phone]  Edythe Clarity [facility]  Pharmacist  07/01/22    Comment: [phone]     Health Maintenance  Topic Date Due   Medicare Annual Wellness (AWV)  12/11/2022   MAMMOGRAM  01/07/2023   COLONOSCOPY (Pts 45-27yr Insurance coverage will need to be confirmed)  03/30/2029   DTaP/Tdap/Td (3 - Td or Tdap) 05/21/2031   Pneumonia Vaccine 71 Years old  Completed   INFLUENZA VACCINE  Completed   DEXA SCAN  Completed   Hepatitis C Screening  Completed   Zoster Vaccines- Shingrix  Completed   HPV VACCINES  Aged Out   COVID-19 Vaccine  Discontinued      Review of Systems Patient reports no vision/ hearing changes, adenopathy,fever, weight change,  persistant/recurrent hoarseness , swallowing issues, chest pain, palpitations, edema, persistant/recurrent cough, hemoptysis, dyspnea (rest/exertional/paroxysmal nocturnal), gastrointestinal bleeding (melena, rectal bleeding), abdominal pain, significant heartburn, bowel changes, GU symptoms (dysuria, hematuria, incontinence), Gyn symptoms (abnormal  bleeding, pain),  syncope, focal weakness, memory loss, numbness & tingling,  skin/hair/nail changes, abnormal bruising or bleeding, anxiety, or depression.     Objective:   Physical Exam General Appearance:    Alert, cooperative, no distress, appears stated age, obese  Head:    Normocephalic, without obvious abnormality, atraumatic  Eyes:    PERRL, conjunctiva/corneas clear, EOM's intact both eyes  Ears:    Normal TM's and external ear canals, both ears  Nose:   Nares normal, septum midline, mucosa normal, no drainage    or sinus tenderness  Throat:   Lips, mucosa, and tongue normal; teeth and gums normal  Neck:   Supple, symmetrical, trachea midline, no adenopathy;    Thyroid: no enlargement/tenderness/nodules  Back:     Symmetric, no curvature, ROM normal, no CVA tenderness  Lungs:     Clear to auscultation bilaterally, respirations unlabored  Chest Wall:    No tenderness or deformity   Heart:    Regular rate and rhythm, S1 and S2 normal, no murmur, rub   or gallop  Breast Exam:    Deferred to GYN  Abdomen:     Soft, non-tender, bowel sounds active all four quadrants,    no masses, no organomegaly  Genitalia:    Deferred to GYN  Rectal:    Extremities:   Extremities normal, atraumatic, no cyanosis or edema  Pulses:   2+ and symmetric all extremities  Skin:   Skin color, texture, turgor normal, no rashes or lesions  Lymph nodes:   Cervical, supraclavicular, and axillary nodes normal  Neurologic:   CNII-XII intact, normal strength, sensation and reflexes    throughout  Assessment & Plan:

## 2022-11-24 NOTE — Assessment & Plan Note (Signed)
Check labs and replete prn. 

## 2022-11-24 NOTE — Assessment & Plan Note (Signed)
Pt's PE WNL w/ exception of BMI.  UTD on colonoscopy, mammo, Tdap, flu, PNA, DEXA.  Check labs.  Anticipatory guidance provided.

## 2022-11-25 ENCOUNTER — Telehealth: Payer: Self-pay

## 2022-11-25 LAB — BASIC METABOLIC PANEL
BUN: 23 mg/dL (ref 6–23)
CO2: 26 mEq/L (ref 19–32)
Calcium: 10 mg/dL (ref 8.4–10.5)
Chloride: 103 mEq/L (ref 96–112)
Creatinine, Ser: 0.86 mg/dL (ref 0.40–1.20)
GFR: 68.17 mL/min (ref >60.00)
Glucose, Bld: 90 mg/dL (ref 70–99)
Potassium: 4.5 mEq/L (ref 3.5–5.1)
Sodium: 141 mEq/L (ref 135–145)

## 2022-11-25 LAB — HEPATIC FUNCTION PANEL
ALT: 30 U/L (ref 0–35)
AST: 26 U/L (ref 0–37)
Albumin: 4.6 g/dL (ref 3.5–5.2)
Alkaline Phosphatase: 89 U/L (ref 39–117)
Bilirubin, Direct: 0.2 mg/dL (ref 0.0–0.3)
Total Bilirubin: 0.7 mg/dL (ref 0.2–1.2)
Total Protein: 7.3 g/dL (ref 6.0–8.3)

## 2022-11-25 LAB — LIPID PANEL
Cholesterol: 162 mg/dL (ref 0–200)
HDL: 67.6 mg/dL (ref >39.00)
LDL Cholesterol: 73 mg/dL (ref 0–99)
NonHDL: 93.94
Total CHOL/HDL Ratio: 2
Triglycerides: 106 mg/dL (ref 0.0–149.0)
VLDL: 21.2 mg/dL (ref 0.0–40.0)

## 2022-11-25 NOTE — Telephone Encounter (Signed)
Informed pt of lab results  

## 2022-11-25 NOTE — Telephone Encounter (Signed)
-----   Message from Midge Minium, MD sent at 11/25/2022 12:17 PM EST ----- Labs look great!  No changes at this time

## 2022-12-16 ENCOUNTER — Ambulatory Visit (INDEPENDENT_AMBULATORY_CARE_PROVIDER_SITE_OTHER): Payer: Medicare HMO

## 2022-12-16 VITALS — Ht 65.0 in | Wt 199.0 lb

## 2022-12-16 DIAGNOSIS — Z0000 Encounter for general adult medical examination without abnormal findings: Secondary | ICD-10-CM

## 2022-12-16 NOTE — Patient Instructions (Addendum)
[name] , Thank you for taking time to come for your Medicare Wellness Visit. I appreciate your ongoing commitment to your health goals. Please review the following plan we discussed and let me know if I can assist you in the future.   These are the goals we discussed:  Goals       Improve cramping      Timeframe:  Long-Range Goal Priority:  High Start Date:  12/09/21                           Expected End Date:  06/11/22                     Follow Up Date 03/11/22    Work toward solutions to improve cramping symptoms    Why is this important?   These steps will help you keep on track with your medicines.   Notes:       Lose weight (pt-stated)      PharmD Care Plan      Weight (lb) < 200 lb (90.7 kg)      Lose weight by cutting out carb/sugars in diet.       Weight (lb) < 200 lb (90.7 kg)      Lose weight by increasing activity.         This is a list of the screening recommended for you and due dates:  Health Maintenance  Topic Date Due   Mammogram  01/07/2023   Medicare Annual Wellness Visit  12/16/2023   Colon Cancer Screening  03/30/2029   DTaP/Tdap/Td vaccine (3 - Td or Tdap) 05/21/2031   Pneumonia Vaccine  Completed   Flu Shot  Completed   DEXA scan (bone density measurement)  Completed   Hepatitis C Screening: USPSTF Recommendation to screen - Ages 74-79 yo.  Completed   Zoster (Shingles) Vaccine  Completed   HPV Vaccine  Aged Out   COVID-19 Vaccine  Discontinued    Advanced directives: Please bring a copy of your health care power of attorney and living will to the office to be added to your chart at your convenience.   Conditions/risks identified: None  Next appointment: Follow up in one year for your annual wellness visit     Preventive Care 65 Years and Older, Female Preventive care refers to lifestyle choices and visits with your health care provider that can promote health and wellness. What does preventive care include? A yearly physical exam.  This is also called an annual well check. Dental exams once or twice a year. Routine eye exams. Ask your health care provider how often you should have your eyes checked. Personal lifestyle choices, including: Daily care of your teeth and gums. Regular physical activity. Eating a healthy diet. Avoiding tobacco and drug use. Limiting alcohol use. Practicing safe sex. Taking low-dose aspirin every day. Taking vitamin and mineral supplements as recommended by your health care provider. What happens during an annual well check? The services and screenings done by your health care provider during your annual well check will depend on your age, overall health, lifestyle risk factors, and family history of disease. Counseling  Your health care provider may ask you questions about your: Alcohol use. Tobacco use. Drug use. Emotional well-being. Home and relationship well-being. Sexual activity. Eating habits. History of falls. Memory and ability to understand (cognition). Work and work Statistician. Reproductive health. Screening  You may have the following tests or measurements:  Height, weight, and BMI. Blood pressure. Lipid and cholesterol levels. These may be checked every 5 years, or more frequently if you are over 47 years old. Skin check. Lung cancer screening. You may have this screening every year starting at age 12 if you have a 30-pack-year history of smoking and currently smoke or have quit within the past 15 years. Fecal occult blood test (FOBT) of the stool. You may have this test every year starting at age 31. Flexible sigmoidoscopy or colonoscopy. You may have a sigmoidoscopy every 5 years or a colonoscopy every 10 years starting at age 72. Hepatitis C blood test. Hepatitis B blood test. Sexually transmitted disease (STD) testing. Diabetes screening. This is done by checking your blood sugar (glucose) after you have not eaten for a while (fasting). You may have this done  every 1-3 years. Bone density scan. This is done to screen for osteoporosis. You may have this done starting at age 12. Mammogram. This may be done every 1-2 years. Talk to your health care provider about how often you should have regular mammograms. Talk with your health care provider about your test results, treatment options, and if necessary, the need for more tests. Vaccines  Your health care provider may recommend certain vaccines, such as: Influenza vaccine. This is recommended every year. Tetanus, diphtheria, and acellular pertussis (Tdap, Td) vaccine. You may need a Td booster every 10 years. Zoster vaccine. You may need this after age 76. Pneumococcal 13-valent conjugate (PCV13) vaccine. One dose is recommended after age 43. Pneumococcal polysaccharide (PPSV23) vaccine. One dose is recommended after age 8. Talk to your health care provider about which screenings and vaccines you need and how often you need them. This information is not intended to replace advice given to you by your health care provider. Make sure you discuss any questions you have with your health care provider. Document Released: 10/17/2015 Document Revised: 06/09/2016 Document Reviewed: 07/22/2015 Elsevier Interactive Patient Education  2017 [place] Prevention in the Home Falls can cause injuries. They can happen to people of all ages. There are many things you can do to make your home safe and to help prevent falls. What can I do on the outside of my home? Regularly fix the edges of walkways and driveways and fix any cracks. Remove anything that might make you trip as you walk through a door, such as a raised step or threshold. Trim any bushes or trees on the path to your home. Use bright outdoor lighting. Clear any walking paths of anything that might make someone trip, such as rocks or tools. Regularly check to see if handrails are loose or broken. Make sure that both sides of any steps have  handrails. Any raised decks and porches should have guardrails on the edges. Have any leaves, snow, or ice cleared regularly. Use sand or salt on walking paths during winter. Clean up any spills in your garage right away. This includes oil or grease spills. What can I do in the bathroom? Use night lights. Install grab bars by the toilet and in the tub and shower. Do not use towel bars as grab bars. Use non-skid mats or decals in the tub or shower. If you need to sit down in the shower, use a plastic, non-slip stool. Keep the floor dry. Clean up any water that spills on the floor as soon as it happens. Remove soap buildup in the tub or shower regularly. Attach bath mats securely with double-sided non-slip rug  tape. Do not have throw rugs and other things on the floor that can make you trip. What can I do in the bedroom? Use night lights. Make sure that you have a light by your bed that is easy to reach. Do not use any sheets or blankets that are too big for your bed. They should not hang down onto the floor. Have a firm chair that has side arms. You can use this for support while you get dressed. Do not have throw rugs and other things on the floor that can make you trip. What can I do in the kitchen? Clean up any spills right away. Avoid walking on wet floors. Keep items that you use a lot in easy-to-reach places. If you need to reach something above you, use a strong step stool that has a grab bar. Keep electrical cords out of the way. Do not use floor polish or wax that makes floors slippery. If you must use wax, use non-skid floor wax. Do not have throw rugs and other things on the floor that can make you trip. What can I do with my stairs? Do not leave any items on the stairs. Make sure that there are handrails on both sides of the stairs and use them. Fix handrails that are broken or loose. Make sure that handrails are as long as the stairways. Check any carpeting to make sure  that it is firmly attached to the stairs. Fix any carpet that is loose or worn. Avoid having throw rugs at the top or bottom of the stairs. If you do have throw rugs, attach them to the floor with carpet tape. Make sure that you have a light switch at the top of the stairs and the bottom of the stairs. If you do not have them, ask someone to add them for you. What else can I do to help prevent falls? Wear shoes that: Do not have high heels. Have rubber bottoms. Are comfortable and fit you well. Are closed at the toe. Do not wear sandals. If you use a stepladder: Make sure that it is fully opened. Do not climb a closed stepladder. Make sure that both sides of the stepladder are locked into place. Ask someone to hold it for you, if possible. Clearly mark and make sure that you can see: Any grab bars or handrails. First and last steps. Where the edge of each step is. Use tools that help you move around (mobility aids) if they are needed. These include: Canes. Walkers. Scooters. Crutches. Turn on the lights when you go into a dark area. Replace any light bulbs as soon as they burn out. Set up your furniture so you have a clear path. Avoid moving your furniture around. If any of your floors are uneven, fix them. If there are any pets around you, be aware of where they are. Review your medicines with your doctor. Some medicines can make you feel dizzy. This can increase your chance of falling. Ask your doctor what other things that you can do to help prevent falls. This information is not intended to replace advice given to you by your health care provider. Make sure you discuss any questions you have with your health care provider. Document Released: 07/17/2009 Document Revised: 02/26/2016 Document Reviewed: 10/25/2014 Elsevier Interactive Patient Education  2017 [employer].

## 2022-12-16 NOTE — Progress Notes (Signed)
Subjective:   [name] [name] is a 72 y.o. female who presents for Medicare Annual (Subsequent) preventive examination.  Review of Systems    Virtual Visit via Telephone Note  I connected with  [name] on 12/16/22 at  1:30 PM EDT by telephone and verified that I am speaking with the correct person using two identifiers.  Location: Patient: Home Provider: Office Persons participating in the virtual visit: patient/Nurse Health Advisor   I discussed the limitations, risks, security and privacy concerns of performing an evaluation and management service by telephone and the availability of in person appointments. The patient expressed understanding and agreed to proceed.  Interactive audio and video telecommunications were attempted between this nurse and patient, however failed, due to patient having technical difficulties OR patient did not have access to video capability.  We continued and completed visit with audio only.  Some vital signs may be absent or patient reported.   Criselda Peaches, LPN  Cardiac Risk Factors include: advanced age (>63mn, >>74women);hypertension     Objective:    Today's Vitals   12/16/22 1335  Weight: 199 lb (90.3 kg)  Height: '5\' 5"'$  (1.651 m)   Body mass index is 33.12 kg/m.     08/23/2022   11:00 AM 12/10/2021    1:17 PM 01/21/2021    9:01 AM 01/12/2021    5:02 PM 11/24/2020    9:47 AM 05/28/2020   10:33 AM 12/12/2019   12:36 PM  Advanced Directives  Does Patient Have a Medical Advance Directive? Yes Yes Yes Yes Yes Yes Yes  Type of ACorporate treasurerof [place] will  Living will;Healthcare Power of [place] will [place] will  Does patient want to make changes to medical advance directive?   No - Patient declined No - Patient declined     Copy of HTorreonin Chart?  No - copy requested   No  - copy requested  No - copy requested    Current Medications (verified) Outpatient Encounter Medications as of 12/16/2022  Medication Sig   aspirin 325 MG tablet Take 325 mg by mouth daily.   B Complex-C (SUPER B COMPLEX PO) Take 1 tablet by mouth daily.    brimonidine (ALPHAGAN) 0.2 % ophthalmic solution 1 drop 3 (three) times daily.   cetirizine (ZYRTEC) 10 MG tablet Take 10 mg by mouth daily.   Cholecalciferol (VITAMIN D3) 5000 UNITS CAPS Take 5,000 Units by mouth daily.    Coenzyme Q10 (COQ10) 200 MG CAPS Take 1 capsule by mouth daily.    doxycycline (ADOXA) 50 MG tablet Take 50 mg by mouth daily. For rosacea   losartan (COZAAR) 50 MG tablet TAKE 1 TABLET(50 MG) BY MOUTH DAILY   Magnesium 200 MG TABS Take 200 mg by mouth in the morning and at bedtime.    Multiple Vitamins-Minerals (MULTIVITAMIN PO) Take 1 tablet by mouth daily.   NP THYROID 60 MG tablet TAKE 1 TABLET(60 MG) BY MOUTH DAILY BEFORE BREAKFAST   omeprazole (PRILOSEC) 20 MG capsule TAKE 1 CAPSULE EVERY DAY   simvastatin (ZOCOR) 20 MG tablet TAKE 1 TABLET AT BEDTIME   SYSTANE BALANCE 0.6 % SOLN SMARTSIG:1 Drop(s) In Eye(s) PRN   Travoprost, BAK Free, (TRAVATAN) 0.004 % SOLN ophthalmic solution Place 1 drop into both eyes at bedtime.    Facility-Administered Encounter Medications as of 12/16/2022  Medication   0.9 %  sodium  chloride infusion    Allergies (verified) Clindamycin/lincomycin, Flagyl [metronidazole hcl], Augmentin [amoxicillin-pot clavulanate], and Telithromycin   History: Past Medical History:  Diagnosis Date   Allergy 12/19/2007   Bronchitis    Cataract    Cognitive dysfunction 07/14/2022   Coronary artery calcification seen on CAT scan 04/09/2021   DJD (degenerative joint disease)    DOE (dyspnea on exertion) 04/09/2021   GERD (gastroesophageal reflux disease) 12/19/2007   Glaucoma    History of adenomatous colonic polyps 01/17/2017   Hypercholesterolemia    Hypothyroidism    IBS (irritable bowel  syndrome)    Malignant melanoma of neck    Mixed hyperlipidemia 04/09/2021   OSA (obstructive sleep apnea) 12/27/2017   no CPAP   Osteoporosis    Plantar fasciitis    Postoperative nausea and vomiting 09/02/2015   Precordial pain 04/09/2021   Presence of shoulder implant 01/21/2021   Primary hypertension 12/19/2007   Primary localized osteoarthritis of left knee    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/21/2021   Tremor of both hands 05/21/2020   Vitamin D deficiency 09/21/2009   Past Surgical History:  Procedure Laterality Date   ANAL RECTAL MANOMETRY N/A 11/16/2019   Procedure: ANO RECTAL MANOMETRY;  Surgeon: Mauri Pole, MD;  Location: [facility];  Service: Endoscopy;  Laterality: N/A;   BUNIONECTOMY  2004   left   CATARACT EXTRACTION W/ INTRAOCULAR LENS  IMPLANT, BILATERAL     COLONOSCOPY     EPIDURAL CATHETER INSERTION     L7   FOOT OSTEOTOMY  2003   right   HERNIA REPAIR  1957   JOINT REPLACEMENT     KNEE ARTHROSCOPY  1987&2003   left   MELANOMA EXCISION Right    shoulder   POLYPECTOMY     REVERSE SHOULDER ARTHROPLASTY Right 01/21/2021   Procedure: REVERSE SHOULDER ARTHROPLASTY;  Surgeon: Hiram Gash, MD;  Location: [facility];  Service: Orthopedics;  Laterality: Right;   SHOULDER ARTHROSCOPY WITH OPEN ROTATOR CUFF REPAIR  2013   right   SKIN SURGERY  01/2004   Melanoma surg left shoulder   TOENAIL EXCISION  10/04/2017   TOTAL KNEE ARTHROPLASTY Left 09/01/2015   Procedure: TOTAL KNEE ARTHROPLASTY;  Surgeon: Elsie Saas, MD;  Location: [facility];  Service: Orthopedics;  Laterality: Left;   TUBAL LIGATION  1990   Family History  Problem Relation Age of Onset   Tremor Mother        no PD   Esophageal cancer Father    Colon polyps Father    Cancer Father        laryngeal   Heart disease Father    Osteoporosis Sister    Hypertension Brother    Heart disease Maternal Grandfather    Heart disease Paternal Grandfather     Diabetes Son    Tremor Maternal Aunt    Alzheimer's disease Maternal Aunt    Colon cancer Neg Hx    Stomach cancer Neg Hx    Crohn's disease Neg Hx    Rectal cancer Neg Hx    Social History   Socioeconomic History   Marital status: Married    Spouse name: [name] [name] [phone]   Number of children: 2   Years of education: 12   Highest education level: High school graduate  Occupational History   Occupation: retired  Tobacco Use   Smoking status: Former    Years: 6    Types: Cigarettes    Quit date: 10/05/1975  Years since quitting: 47.2    Passive exposure: Past   Smokeless tobacco: Never  Vaping Use   Vaping Use: Never used  Substance and Sexual Activity   Alcohol use: Yes    Alcohol/week: 7.0 standard drinks of alcohol    Types: 7 Standard drinks or equivalent per week    Comment: 1 beverage nightly   Drug use: No   Sexual activity: Not on file  Other Topics Concern   Not on file  Social History Narrative   Right handed   Drinks caffeine, tea   Retired   Lives with her husband   Social Determinants of Health   Financial Resource Strain: Low Risk  (12/16/2022)   Overall Financial Resource Strain (CARDIA)    Difficulty of Paying Living Expenses: Not hard at all  Food Insecurity: No Food Insecurity (12/16/2022)   Hunger Vital Sign    Worried About Running Out of Food in the Last Year: Never true    [place] in the Last Year: Never true  Transportation Needs: No Transportation Needs (12/16/2022)   PRAPARE - Hydrologist (Medical): No    Lack of Transportation (Non-Medical): No  Physical Activity: Insufficiently Active (12/16/2022)   Exercise Vital Sign    Days of Exercise per Week: 4 days    Minutes of Exercise per Session: 30 min  Stress: No Stress Concern Present (12/16/2022)   [place]    Feeling of Stress : Not at all  Social Connections: [place]  (12/16/2022)   Social Connection and Isolation Panel [NHANES]    Frequency of Communication with Friends and Family: More than three times a week    Frequency of Social Gatherings with Friends and Family: More than three times a week    Attends Religious Services: More than 4 times per year    Active Member of [employer] or Organizations: Yes    Attends Music therapist: More than 4 times per year    Marital Status: Married    Tobacco Counseling Counseling given: Not Answered   Clinical Intake:  Pre-visit preparation completed: Yes  Pain : No/denies pain     BMI - recorded: 33.12 Nutritional Status: BMI > 30  Obese Nutritional Risks: None Diabetes: No  How often do you need to have someone help you when you read instructions, pamphlets, or other written materials from your doctor or pharmacy?: 1 - Never  Diabetic?  No  Interpreter Needed?: No  Information entered by :: Rolene Arbour LPN   Activities of Daily Living    12/16/2022    1:40 PM 12/16/2022    7:48 AM  In your present state of health, do you have any difficulty performing the following activities:  Hearing? 0 0  Vision? 0 0  Difficulty concentrating or making decisions? 0 0  Walking or climbing stairs? 0 0  Dressing or bathing? 0 0  Doing errands, shopping? 0 0  Preparing Food and eating ? N N  Using the Toilet? N N  In the past six months, have you accidently leaked urine? N N  Do you have problems with loss of bowel control? N Y  Managing your Medications? N N  Managing your Finances? N N  Housekeeping or managing your Housekeeping? N N    Patient Care Team: Midge Minium, MD as PCP - General (Family Medicine) Berniece Salines, DO as PCP - Cardiology (Cardiology) Roque Cash.,  MD as Consulting Physician (Obstetrics and Gynecology) Susie Cassette, Katharine Look, MD as Referring Physician (Dermatology) Elsie Saas, MD as Consulting Physician (Orthopedic Surgery) Regal, Tamala Fothergill, DPM as  Consulting Physician (Podiatry) Janann August, MD as Referring Physician (Dermatology) Darleen Crocker, MD as Consulting Physician (Ophthalmology) Edythe Clarity, [facility] as Pharmacist (Pharmacist) Edythe Clarity, [facility] (Pharmacist)  Indicate any recent Medical Services you may have received from other than [facility] providers in the past year (date may be approximate).     Assessment:   This is a routine wellness examination for [place].  Hearing/Vision screen Hearing Screening - Comments:: Denies hearing difficulties   Vision Screening - Comments:: - up to date with routine eye exams with  Dr Talbert Forest  Dietary issues and exercise activities discussed: Exercise limited by: None identified   Goals Addressed               This Visit's Progress     Lose weight (pt-stated)         Depression Screen    12/16/2022    1:40 PM 11/24/2022   10:45 AM 08/06/2022   10:48 AM 07/13/2022   10:31 AM 06/17/2022   12:54 PM 12/10/2021    1:18 PM 12/10/2021    1:16 PM  PHQ 2/9 Scores  PHQ - 2 Score 0 0 0 0 0 0 0  PHQ- 9 Score 0 1 0 0       Fall Risk    12/16/2022    1:41 PM 12/16/2022    7:48 AM 11/24/2022   10:45 AM 08/23/2022   11:00 AM 08/06/2022   10:49 AM  Fall Risk   Falls in the past year? 0 0 0 0 0  Number falls in past yr: 0  0 0   Injury with Fall? 0  0 0   Risk for fall due to : No Fall Risks  No Fall Risks    Follow up Falls prevention discussed  Falls evaluation completed Falls evaluation completed Falls evaluation completed    [place]:  Any stairs in or around the home? Yes  If so, are there any without handrails? No  Home free of loose throw rugs in walkways, pet beds, electrical cords, etc? Yes  Adequate lighting in your home to reduce risk of falls? Yes   ASSISTIVE DEVICES UTILIZED TO PREVENT FALLS:  Life alert? No  Use of a cane, walker or w/c? No  Grab bars in the bathroom? No  Shower chair or bench in shower? No  Elevated  toilet seat or a handicapped toilet? No   TIMED UP AND GO:  Was the test performed? No . Audio Visit   Cognitive Function:    08/23/2022    5:00 PM 11/15/2018   10:05 AM 11/09/2017    9:35 AM  MMSE - Mini Mental State Exam  Orientation to time '5 5 5  '$ Orientation to Place '5 5 5  '$ Registration '3 3 3  '$ Attention/ Calculation '5 5 5  '$ Recall '3 3 3  '$ Language- name 2 objects '2 2 2  '$ Language- repeat '1 1 1  '$ Language- follow 3 step command '3 3 3  '$ Language- read & follow direction '1 1 1  '$ Write a sentence '1 1 1  '$ Copy design '1 1 1  '$ Total score '30 30 30        '$ 12/16/2022    1:41 PM  6CIT Screen  What Year? 0 points  What month? 0 points  What  time? 0 points  Count back from 20 0 points  Months in reverse 0 points  Repeat phrase 4 points  Total Score 4 points    Immunizations Immunization History  Administered Date(s) Administered   Fluad Quad(high Dose 65+) 06/04/2019, 07/02/2021, 07/16/2021, 06/17/2022   Influenza Split 08/04/2012   Influenza, High Dose Seasonal PF 06/12/2018, 06/03/2020   Influenza, Seasonal, Injecte, Preservative Fre 06/04/2019   Influenza,inj,Quad PF,6+ Mos 11/22/2014, 07/01/2015, 06/04/2016, 07/25/2017   Influenza-Unspecified 07/03/2021   PFIZER(Purple Top)SARS-COV-2 Vaccination 10/26/2019, 11/13/2019, 06/19/2020, 02/11/2021   PNEUMOCOCCAL CONJUGATE-20 05/20/2021   Pfizer Covid-19 Vaccine Bivalent Booster 69yr & up 07/16/2021   Pneumococcal Conjugate-13 05/17/2018   Tdap 10/01/2011, 05/20/2021   Zoster Recombinat (Shingrix) 07/25/2017, 12/22/2017   Zoster, Live 12/03/2015    TDAP status: Up to date  Flu Vaccine status: Up to date  Pneumococcal vaccine status: Up to date  Covid-19 vaccine status: Completed vaccines  Qualifies for Shingles Vaccine? Yes   Zostavax completed Yes   Shingrix Completed?: Yes  Screening Tests Health Maintenance  Topic Date Due   MAMMOGRAM  01/07/2023   Medicare Annual Wellness (AWV)  12/16/2023   COLONOSCOPY (Pts  [phone] coverage will need to be confirmed)  03/30/2029   DTaP/Tdap/Td (3 - Td or Tdap) 05/21/2031   Pneumonia Vaccine 6542Years old  Completed   INFLUENZA VACCINE  Completed   DEXA SCAN  Completed   Hepatitis C Screening  Completed   Zoster Vaccines- Shingrix  Completed   HPV VACCINES  Aged Out   COVID-19 Vaccine  Discontinued    Health Maintenance  There are no preventive care reminders to display for this patient.   Colorectal cancer screening: Type of screening: Colonoscopy. Completed 03/30/22. Repeat every 7 years  Mammogram status: Completed 01/06/22. Repeat every year  Bone Density status: Completed 12/03/20. Results reflect: Bone density results: OSTEOPOROSIS. Repeat every   years.  Lung Cancer Screening: (Low Dose CT Chest recommended if Age 71-80ears, 30 pack-year currently smoking OR have quit w/in 15years.) does not qualify.     Additional Screening:  Hepatitis C Screening: does qualify; Completed 11/04/16  Vision Screening: Recommended annual ophthalmology exams for early detection of glaucoma and other disorders of the eye. Is the patient up to date with their annual eye exam?  Yes  Who is the provider or what is the name of the office in which the patient attends annual eye exams? Dr BeTalbert Forestf pt is not established with a provider, would they like to be referred to a provider to establish care? No .   Dental Screening: Recommended annual dental exams for proper oral hygiene  Community Resource Referral / Chronic Care Management:  CRR required this visit?  No   CCM required this visit?  No      Plan:     I have personally reviewed and noted the following in the patient's chart:   Medical and social history Use of alcohol, tobacco or illicit drugs  Current medications and supplements including opioid prescriptions. Patient is not currently taking opioid prescriptions. Functional ability and status Nutritional status Physical activity Advanced  directives List of other physicians Hospitalizations, surgeries, and ER visits in previous 12 months Vitals Screenings to include cognitive, depression, and falls Referrals and appointments  In addition, I have reviewed and discussed with patient certain preventive protocols, quality metrics, and best practice recommendations. A written personalized care plan for preventive services as well as general preventive health recommendations were provided to patient.  Criselda Peaches, LPN   [id number]   Nurse Notes: None

## 2022-12-23 ENCOUNTER — Telehealth (INDEPENDENT_AMBULATORY_CARE_PROVIDER_SITE_OTHER): Payer: Medicare HMO | Admitting: Family Medicine

## 2022-12-23 ENCOUNTER — Encounter: Payer: Self-pay | Admitting: Family Medicine

## 2022-12-23 VITALS — Ht 65.0 in | Wt 196.0 lb

## 2022-12-23 DIAGNOSIS — U071 COVID-19: Secondary | ICD-10-CM | POA: Diagnosis not present

## 2022-12-23 MED ORDER — LOSARTAN POTASSIUM 50 MG PO TABS: ORAL_TABLET | ORAL | 1 refills | Status: DC

## 2022-12-23 NOTE — Progress Notes (Signed)
Virtual Visit via Video   I connected with patient on 12/23/22 at 10:20 AM EDT by a video enabled telemedicine application and verified that I am speaking with the correct person using two identifiers.  Location patient: Home Location provider: [facility], Office Persons participating in the virtual visit: Patient, Provider, [place] Marcille Blanco C)  I discussed the limitations of evaluation and management by telemedicine and the availability of in person appointments. The patient expressed understanding and agreed to proceed.  Subjective:   HPI:   COVID- sxs started Sunday evening w/ runny nose, malaise, HA.  + dry cough.  Tongue 'is very white'.  Not painful.  No fever.  Minimal body aches.  Tested + on Tuesday.  + head congestion.  No SOB.  No N/V, some loose BM's.  + fatigue.    ROS:   See pertinent positives and negatives per HPI.  Patient Active Problem List   Diagnosis Date Noted   Physical exam 11/24/2022   Memory difficulties 08/23/2022   Osteoporosis 07/14/2022   Cognitive dysfunction 07/14/2022   Malignant melanoma of neck    Precordial pain 04/09/2021   DOE (dyspnea on exertion) 04/09/2021   Coronary artery calcification seen on CAT scan 04/09/2021   Mixed hyperlipidemia 04/09/2021   Glaucoma 03/16/2021   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/21/2021   Tremor of both hands 05/21/2020   Obesity (BMI 30-39.9) 11/15/2018   History of adenomatous colonic polyps 01/17/2017   IBS (irritable bowel syndrome)    Hypothyroidism 11/22/2014   Vitamin D deficiency 09/21/2009   Primary hypertension 12/19/2007   GERD (gastroesophageal reflux disease) 12/19/2007   Allergy 12/19/2007    Social History   Tobacco Use   Smoking status: Former    Years: 6    Types: Cigarettes    Quit date: 10/05/1975    Years since quitting: 47.2    Passive exposure: Past   Smokeless tobacco: Never  Substance Use Topics   Alcohol use: Yes    Alcohol/week: 7.0 standard drinks  of alcohol    Types: 7 Standard drinks or equivalent per week    Comment: 1 beverage nightly    Current Outpatient Medications:    aspirin 325 MG tablet, Take 325 mg by mouth daily., Disp: , Rfl:    B Complex-C (SUPER B COMPLEX PO), Take 1 tablet by mouth daily. , Disp: , Rfl:    brimonidine (ALPHAGAN) 0.2 % ophthalmic solution, 1 drop 3 (three) times daily., Disp: , Rfl:    cetirizine (ZYRTEC) 10 MG tablet, Take 10 mg by mouth daily., Disp: , Rfl:    Cholecalciferol (VITAMIN D3) 5000 UNITS CAPS, Take 5,000 Units by mouth daily. , Disp: , Rfl:    Coenzyme Q10 (COQ10) 200 MG CAPS, Take 1 capsule by mouth daily. , Disp: , Rfl:    doxycycline (ADOXA) 50 MG tablet, Take 50 mg by mouth daily. For rosacea, Disp: , Rfl:    Magnesium 200 MG TABS, Take 200 mg by mouth in the morning and at bedtime. , Disp: , Rfl:    Multiple Vitamins-Minerals (MULTIVITAMIN PO), Take 1 tablet by mouth daily., Disp: , Rfl:    NP THYROID 60 MG tablet, TAKE 1 TABLET(60 MG) BY MOUTH DAILY BEFORE BREAKFAST, Disp: 90 tablet, Rfl: 1   omeprazole (PRILOSEC) 20 MG capsule, TAKE 1 CAPSULE EVERY DAY, Disp: 90 capsule, Rfl: 3   simvastatin (ZOCOR) 20 MG tablet, TAKE 1 TABLET AT BEDTIME, Disp: 90 tablet, Rfl: 1   SYSTANE BALANCE 0.6 %  SOLN, SMARTSIG:1 Drop(s) In Eye(s) PRN, Disp: , Rfl:    Travoprost, BAK Free, (TRAVATAN) 0.004 % SOLN ophthalmic solution, Place 1 drop into both eyes at bedtime. , Disp: , Rfl:    losartan (COZAAR) 50 MG tablet, TAKE 1 TABLET(50 MG) BY MOUTH DAILY, Disp: 90 tablet, Rfl: 1  Current Facility-Administered Medications:    0.9 %  sodium chloride infusion, 500 mL, Intravenous, Once, Ladene Artist, MD  Allergies  Allergen Reactions   Clindamycin/Lincomycin Diarrhea   Flagyl [Metronidazole Hcl] Diarrhea    Stomach upset. Pt states she CANNOT TAKE oral, has no problem with topical.    Augmentin [Amoxicillin-Pot Clavulanate] Diarrhea   Telithromycin Diarrhea    Objective:   Ht 5\' 5"  (1.651 m)    Wt 196 lb (88.9 kg)   BMI 32.62 kg/m  AAOx3, NAD NCAT, EOMI No obvious CN deficits Coloring WNL Pt is able to speak clearly, coherently without shortness of breath or increased work of breathing.  Thought process is linear.  Mood is appropriate.   Assessment and Plan:   COVID- new.  Pt developed sxs Sunday night- 3 days after husband had sxs- and tested + on Tuesday.  Thankfully sxs are mild and she is doing well.  No need for Paxlovid as the side effects may be worse than her current sxs.  + fatigue and head congestion.  Reviewed supportive care and red flags that should prompt return.  Pt expressed understanding and is in agreement w/ plan.    Annye Asa, MD 12/23/2022

## 2023-01-01 ENCOUNTER — Other Ambulatory Visit: Payer: Self-pay | Admitting: Family Medicine

## 2023-01-06 DIAGNOSIS — L244 Irritant contact dermatitis due to drugs in contact with skin: Secondary | ICD-10-CM | POA: Diagnosis not present

## 2023-02-09 ENCOUNTER — Other Ambulatory Visit: Payer: Self-pay

## 2023-02-09 MED ORDER — NP THYROID 60 MG PO TABS: ORAL_TABLET | ORAL | 1 refills | Status: DC

## 2023-03-01 ENCOUNTER — Encounter: Payer: Self-pay | Admitting: Physician Assistant

## 2023-03-01 ENCOUNTER — Ambulatory Visit (INDEPENDENT_AMBULATORY_CARE_PROVIDER_SITE_OTHER): Payer: Medicare HMO | Admitting: Family Medicine

## 2023-03-01 ENCOUNTER — Ambulatory Visit: Payer: Medicare HMO | Admitting: Physician Assistant

## 2023-03-01 ENCOUNTER — Encounter: Payer: Self-pay | Admitting: Family Medicine

## 2023-03-01 VITALS — BP 128/82 | HR 72 | Temp 98.3°F | Resp 18 | Ht 65.0 in | Wt 200.5 lb

## 2023-03-01 VITALS — BP 110/60 | HR 91 | Resp 18 | Ht 65.0 in | Wt 201.0 lb

## 2023-03-01 DIAGNOSIS — Z6833 Body mass index (BMI) 33.0-33.9, adult: Secondary | ICD-10-CM

## 2023-03-01 DIAGNOSIS — R251 Tremor, unspecified: Secondary | ICD-10-CM

## 2023-03-01 DIAGNOSIS — E669 Obesity, unspecified: Secondary | ICD-10-CM | POA: Diagnosis not present

## 2023-03-01 DIAGNOSIS — R413 Other amnesia: Secondary | ICD-10-CM

## 2023-03-01 NOTE — Patient Instructions (Addendum)
Follow up in 6 months Repeat the Neuropsychological evaluation  Follow up with pulmonary for sleep apnea

## 2023-03-01 NOTE — Assessment & Plan Note (Signed)
Ongoing issue for pt.  She has gained 5 lbs since last visit.  Not currently doing any structured or regular exercise.  Encouraged her to use her Silver Sneakers at [employer].  Discussed low carb diet.  Will continue to follow.

## 2023-03-01 NOTE — Progress Notes (Signed)
Assessment/Plan:   Memory difficulties  [name] [name] is a very pleasant 72 y.o. RH female presenting today in follow-up for evaluation of memory loss. MRI brain fromApril 2023 personally reviewed was remarkable for mild small chronic vessel disease, with and mild/moderate atrophy.  Neuropsych evaluation on 07/21/2022 suggested a diagnosis of essential tremor over Parkinson's disease mild cognitive impairment,  when combined with mild chronic microvascular disease and untreated sleep apnea, represents the most likely cause of performance by her ability and subjective data today function.  The studies did not suggest rapid forgetting or storage deficit, and is not concerning for Alzheimer's disease at the present time.  MMSE today 29/30.  She continues to do well, no cognitive decline is noted.  She continues to be independent with her ADLs, and is able to drive without any difficulty.  Minimal tremors are stable, she is able to perform well.   Recommendations:   Follow up in  6 months. No indication for antidementia medication at this time Recommend good control of cardiovascular risk factors Continue to control mood as per PCP Repeat Neuropsych testing in 1 year for clarity of diagnosis and disease trajectory       Subjective:   This patient is here alone. Previous records as well as any outside records available were reviewed prior to todays visit.   Patient was last seen on 08/23/22 with MMSE of 30/30    Any changes in memory since last visit? She continues to have difficulty "inserting the right word" in the sentence. Denies difficulty remembering recent conversations and people names. Likes to do crosswords and word finding.  repeats oneself?  Endorsed Disoriented when walking into a room?  Patient denies   Leaving objects in unusual places?  Patient denies. Occasionally loses the phone but is able to locate it quickly "just in case I have my tracker device "  Wandering  behavior?   denies   Any personality changes since last visit?   denies   Any worsening depression?: denies   Hallucinations or paranoia?  denies   Seizures?   denies    Any sleep changes? Sleeps well. Reports occasionally vivid dreams, Denies REM behavior or sleepwalking.   Sleep apnea?  She reports a history of mild OSA but is not on CPAP. Latest pulmonary test Jan 2024 was unremarkable Any hygiene concerns?   denies   Independent of bathing and dressing?  Endorsed  Does the patient needs help with medications? Patient is in charge   Who is in charge of the finances?  Patient is in charge     Any changes in appetite?  denies     Patient have trouble swallowing?  denies   Does the patient cook?  Yes Any kitchen accidents such as leaving the stove on?   denies   Any headaches?   denies   Vision changes? Denies. She has glaucoma but no new issues.  Chronic pain ? Endorsed. She has received injections  C5-C7 02/25/22.  She is not interested in receiving further injections, she tries to remain active. Ambulates with difficulty?  denies   Recent falls or head injuries? denies     Unilateral weakness, numbness or tingling? denies   Any tremors? Minimal tremors, bilateral, right greater than left no new issues Any anosmia?  denies   Any incontinence of urine?  denies   Any bowel dysfunction?  Incomplete emptying due to sphincter issues. Did therapy for it  Patient lives with husband   Does the  patient drive?No issues driving.  Denies getting lost  Past Medical History:  Diagnosis Date   Allergy 12/19/2007   Bronchitis    Cataract    Cognitive dysfunction 07/14/2022   Coronary artery calcification seen on CAT scan 04/09/2021   DJD (degenerative joint disease)    DOE (dyspnea on exertion) 04/09/2021   GERD (gastroesophageal reflux disease) 12/19/2007   Glaucoma    History of adenomatous colonic polyps 01/17/2017   Hypercholesterolemia    Hypothyroidism    IBS (irritable bowel  syndrome)    Malignant melanoma of neck    Mixed hyperlipidemia 04/09/2021   OSA (obstructive sleep apnea) 12/27/2017   no CPAP   Osteoporosis    Plantar fasciitis    Postoperative nausea and vomiting 09/02/2015   Precordial pain 04/09/2021   Presence of shoulder implant 01/21/2021   Primary hypertension 12/19/2007   Primary localized osteoarthritis of left knee    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/21/2021   Tremor of both hands 05/21/2020   Vitamin D deficiency 09/21/2009     Past Surgical History:  Procedure Laterality Date   ANAL RECTAL MANOMETRY N/A 11/16/2019   Procedure: ANO RECTAL MANOMETRY;  Surgeon: Napoleon Form, MD;  Location: [facility];  Service: Endoscopy;  Laterality: N/A;   BUNIONECTOMY  2004   left   CATARACT EXTRACTION W/ INTRAOCULAR LENS  IMPLANT, BILATERAL     COLONOSCOPY     EPIDURAL CATHETER INSERTION     L7   FOOT OSTEOTOMY  2003   right   HERNIA REPAIR  1957   JOINT REPLACEMENT     KNEE ARTHROSCOPY  1987&2003   left   MELANOMA EXCISION Right    shoulder   POLYPECTOMY     REVERSE SHOULDER ARTHROPLASTY Right 01/21/2021   Procedure: REVERSE SHOULDER ARTHROPLASTY;  Surgeon: Bjorn Pippin, MD;  Location: [facility];  Service: Orthopedics;  Laterality: Right;   SHOULDER ARTHROSCOPY WITH OPEN ROTATOR CUFF REPAIR  2013   right   SKIN SURGERY  01/2004   Melanoma surg left shoulder   TOENAIL EXCISION  10/04/2017   TOTAL KNEE ARTHROPLASTY Left 09/01/2015   Procedure: TOTAL KNEE ARTHROPLASTY;  Surgeon: Salvatore Marvel, MD;  Location: [facility];  Service: Orthopedics;  Laterality: Left;   TUBAL LIGATION  1990     PREVIOUS MEDICATIONS:   CURRENT MEDICATIONS:  Outpatient Encounter Medications as of 03/01/2023  Medication Sig   aspirin 325 MG tablet Take 325 mg by mouth daily.   B Complex-C (SUPER B COMPLEX PO) Take 1 tablet by mouth daily.    brimonidine (ALPHAGAN) 0.2 % ophthalmic solution 1 drop 3 (three) times daily.    cetirizine (ZYRTEC) 10 MG tablet Take 10 mg by mouth daily.   Cholecalciferol (VITAMIN D3) 5000 UNITS CAPS Take 5,000 Units by mouth daily.    Coenzyme Q10 (COQ10) 200 MG CAPS Take 1 capsule by mouth daily.    doxycycline (ADOXA) 50 MG tablet Take 50 mg by mouth daily. For rosacea   losartan (COZAAR) 50 MG tablet TAKE 1 TABLET(50 MG) BY MOUTH DAILY   Magnesium 200 MG TABS Take 200 mg by mouth in the morning and at bedtime.    Multiple Vitamins-Minerals (MULTIVITAMIN PO) Take 1 tablet by mouth daily.   NP THYROID 60 MG tablet TAKE 1 TABLET(60 MG) BY MOUTH DAILY BEFORE BREAKFAST   omeprazole (PRILOSEC) 20 MG capsule TAKE 1 CAPSULE EVERY DAY   simvastatin (ZOCOR) 20 MG tablet TAKE 1 TABLET AT BEDTIME  SYSTANE BALANCE 0.6 % SOLN SMARTSIG:1 Drop(s) In Eye(s) PRN   Travoprost, BAK Free, (TRAVATAN) 0.004 % SOLN ophthalmic solution Place 1 drop into both eyes at bedtime.    Facility-Administered Encounter Medications as of 03/01/2023  Medication   0.9 %  sodium chloride infusion     Objective:     PHYSICAL EXAMINATION:    VITALS:   Vitals:   03/01/23 0842  BP: 110/60  Pulse: 91  Resp: 18  SpO2: 95%  Weight: 201 lb (91.2 kg)  Height: 5\' 5"  (1.651 m)    GEN:  The patient appears stated age and is in NAD. HEENT:  Normocephalic, atraumatic.   Neurological examination:  General: NAD, well-groomed, appears stated age. Orientation: The patient is alert. Oriented to person, place and date Cranial nerves: There is good facial symmetry.The speech is fluent and clear. No aphasia or dysarthria. Fund of knowledge is appropriate. Recent memory impaired and remote memory is normal.  Attention and concentration are normal.  Able to name objects and repeat phrases.  Hearing is intact to conversational tone. Delayed recall 3/3  Sensation: Sensation is intact to light touch throughout Motor: Strength is at least antigravity x4. DTR's 2/4 in UE/LE       No data to display              03/01/2023    9:00 AM 08/23/2022    5:00 PM 11/15/2018   10:05 AM  MMSE - Mini Mental State Exam  Orientation to time 4 5 5   Orientation to Place 5 5 5   Registration 3 3 3   Attention/ Calculation 5 5 5   Recall 3 3 3   Language- name 2 objects 2 2 2   Language- repeat 1 1 1   Language- follow 3 step command 3 3 3   Language- read & follow direction 1 1 1   Write a sentence 1 1 1   Copy design 1 1 1   Total score 29 30 30        Movement examination: Tone: There is normal tone in the UE/LE Abnormal movements:  minimal intention tremor on the UE right greater than left.  No myoclonus.  No asterixis.   Coordination:  There is no decremation with RAM's. Normal finger to nose  Gait and Station: The patient has no difficulty arising out of a deep-seated chair without the use of the hands. The patient's stride length is good.  Gait is cautious and narrow.   Thank you for allowing [place] the opportunity to participate in the care of this nice patient. Please do not hesitate to contact us for any questions or concerns.   Total time spent on today's visit was 33 minutes dedicated to this patient today, preparing to see patient, examining the patient, ordering tests and/or medications and counseling the patient, documenting clinical information in the EHR or other health record, independently interpreting results and communicating results to the patient/family, discussing treatment and goals, answering patient's questions and coordinating care.  Cc:  Sheliah Hatch, MD  Marlowe Kays 03/01/2023 9:51 AM

## 2023-03-01 NOTE — Progress Notes (Signed)
   Subjective:    Patient ID: [name] [name], female    DOB: 1950-12-29, 72 y.o.   MRN: [id number]  HPI Obesity- pt has gained 5 lbs since last visit.  Pt reports feeling good, 'i just haven't lost any weight'.  Pt reports 'way too many hotdogs and hamburgers' over the weekend.  No regular exercise- [place] not yet been [place]  Has Silver Sneakers.  Has stationary bike at home.  Has switched to 1/2 and 1/2 tea.   Review of Systems For ROS see HPI     Objective:   Physical Exam Constitutional:      General: She is not in acute distress.    Appearance: Normal appearance. She is well-developed. She is obese. She is not ill-appearing.  HENT:     Head: Normocephalic and atraumatic.  Eyes:     Conjunctiva/sclera: Conjunctivae normal.     Pupils: Pupils are equal, round, and reactive to light.  Neck:     Thyroid: No thyromegaly.  Cardiovascular:     Rate and Rhythm: Normal rate and regular rhythm.     Pulses: Normal pulses.     Heart sounds: Normal heart sounds. No murmur heard. Pulmonary:     Effort: Pulmonary effort is normal. No respiratory distress.     Breath sounds: Normal breath sounds.  Abdominal:     General: There is no distension.     Palpations: Abdomen is soft.     Tenderness: There is no abdominal tenderness.  Musculoskeletal:     Cervical back: Normal range of motion and neck supple.     Right lower leg: No edema.     Left lower leg: No edema.  Lymphadenopathy:     Cervical: No cervical adenopathy.  Skin:    General: Skin is warm and dry.  Neurological:     General: No focal deficit present.     Mental Status: She is alert and oriented to person, place, and time.  Psychiatric:        Behavior: Behavior normal.           Assessment & Plan:

## 2023-03-01 NOTE — Patient Instructions (Signed)
Follow up in 3 months to recheck blood pressure, cholesterol, and weight loss No need for labs today- yay! Continue to work on low carb, low sugar diet- you can do it! Add regular physical activity- Silver Sneakers or the bike Call with any questions or concerns Stay Safe!  Stay Healthy! Have a great summer!!!

## 2023-03-02 ENCOUNTER — Ambulatory Visit: Payer: Medicare HMO | Admitting: Nurse Practitioner

## 2023-03-02 ENCOUNTER — Encounter: Payer: Self-pay | Admitting: Nurse Practitioner

## 2023-03-02 VITALS — BP 138/80 | HR 64 | Temp 97.3°F | Ht 65.0 in | Wt 201.6 lb

## 2023-03-02 DIAGNOSIS — G4733 Obstructive sleep apnea (adult) (pediatric): Secondary | ICD-10-CM | POA: Diagnosis not present

## 2023-03-02 NOTE — Progress Notes (Signed)
@Patient  ID: [name] [name], female    DOB: 07-16-1951, 72 y.o.   MRN: [id number]  Chief Complaint  Patient presents with   Follow-up    Sleep Study Results.     Referring provider: Sheliah Hatch, MD  HPI: 72 year old female, former smoker followed for DOE and mild sleep apnea. She was previously followed by Dr. Kendrick Fries and last seen in office 09/01/2022. Past medical history significant for HTN, GERD, IBS, hypothyroid, vitamin d deficiency, glaucoma, malignant melanoma of neck.  TEST/EVENTS:  09/01/2022 PFT: FVC 107, FEV1 97, ratio 72, TLC 104, DLCO 96.  No BD 10/13/2022 HST: AHI 11.8/h, SpO2 low 87%  09/01/2022: OV with Dr. Kendrick Fries. No further trouble with dyspnea. Hasn't started an exercise program. She's been really active visiting friends. PFTs were nl. She says that her husband has told her that she quits breathing at night and has a lot of snoring. She will fall asleep watching television. She doesn't nap. Generally feels well rested in the mornings but sometimes feels tired. Repeat CT chest in September 2024 through [facility].   03/02/2023: Today - follow up Patient presents today for overdue follow-up.  She had a sleep study after her last visit on October 13, 2022 that showed mild sleep apnea.  She continues to have loud snoring at night and has issues with restless sleep as well.  She feels fatigued some days.  Curious what her treatment options might be.  She denies any drowsy driving or morning headaches.  Her breathing has been doing well lately.  Does not feel like she is having any issues with shortness of breath.  No significant cough, chest congestion or wheezing.  Allergies  Allergen Reactions   Clindamycin/Lincomycin Diarrhea   Flagyl [Metronidazole Hcl] Diarrhea    Stomach upset. Pt states she CANNOT TAKE oral, has no problem with topical.    Augmentin [Amoxicillin-Pot Clavulanate] Diarrhea   Telithromycin Diarrhea    Immunization  History  Administered Date(s) Administered   Fluad Quad(high Dose 65+) 06/04/2019, 07/02/2021, 07/16/2021, 06/17/2022   Influenza Split 08/04/2012   Influenza, High Dose Seasonal PF 06/12/2018, 06/03/2020   Influenza, Seasonal, Injecte, Preservative Fre 06/04/2019   Influenza,inj,Quad PF,6+ Mos 11/22/2014, 07/01/2015, 06/04/2016, 07/25/2017   Influenza-Unspecified 07/03/2021   PFIZER(Purple Top)SARS-COV-2 Vaccination 10/26/2019, 11/13/2019, 06/19/2020, 02/11/2021   PNEUMOCOCCAL CONJUGATE-20 05/20/2021   Pfizer Covid-19 Vaccine Bivalent Booster 10yrs & up 07/16/2021   Pneumococcal Conjugate-13 05/17/2018   Tdap 10/01/2011, 05/20/2021   Zoster Recombinat (Shingrix) 07/25/2017, 12/22/2017   Zoster, Live 12/03/2015    Past Medical History:  Diagnosis Date   Allergy 12/19/2007   Bronchitis    Cataract    Cognitive dysfunction 07/14/2022   Coronary artery calcification seen on CAT scan 04/09/2021   DJD (degenerative joint disease)    DOE (dyspnea on exertion) 04/09/2021   GERD (gastroesophageal reflux disease) 12/19/2007   Glaucoma    History of adenomatous colonic polyps 01/17/2017   Hypercholesterolemia    Hypothyroidism    IBS (irritable bowel syndrome)    Malignant melanoma of neck    Mixed hyperlipidemia 04/09/2021   OSA (obstructive sleep apnea) 12/27/2017   no CPAP   Osteoporosis    Plantar fasciitis    Postoperative nausea and vomiting 09/02/2015   Precordial pain 04/09/2021   Presence of shoulder implant 01/21/2021   Primary hypertension 12/19/2007   Primary localized osteoarthritis of left knee    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/21/2021   Tremor of both  hands 05/21/2020   Vitamin D deficiency 09/21/2009    Tobacco History: Social History   Tobacco Use  Smoking Status Former   Years: 6   Types: Cigarettes   Quit date: 10/05/1975   Years since quitting: 47.4   Passive exposure: Past  Smokeless Tobacco Never   Counseling given: Not  Answered   Outpatient Medications Prior to Visit  Medication Sig Dispense Refill   aspirin 325 MG tablet Take 325 mg by mouth daily.     B Complex-C (SUPER B COMPLEX PO) Take 1 tablet by mouth daily.      brimonidine (ALPHAGAN) 0.2 % ophthalmic solution 1 drop 3 (three) times daily.     cetirizine (ZYRTEC) 10 MG tablet Take 10 mg by mouth daily.     Cholecalciferol (VITAMIN D3) 5000 UNITS CAPS Take 5,000 Units by mouth daily.      Coenzyme Q10 (COQ10) 200 MG CAPS Take 1 capsule by mouth daily.      doxycycline (ADOXA) 50 MG tablet Take 50 mg by mouth daily. For rosacea     losartan (COZAAR) 50 MG tablet TAKE 1 TABLET(50 MG) BY MOUTH DAILY 90 tablet 1   Magnesium 200 MG TABS Take 200 mg by mouth in the morning and at bedtime.      Multiple Vitamins-Minerals (MULTIVITAMIN PO) Take 1 tablet by mouth daily.     NP THYROID 60 MG tablet TAKE 1 TABLET(60 MG) BY MOUTH DAILY BEFORE BREAKFAST 90 tablet 1   omeprazole (PRILOSEC) 20 MG capsule TAKE 1 CAPSULE EVERY DAY 90 capsule 3   simvastatin (ZOCOR) 20 MG tablet TAKE 1 TABLET AT BEDTIME 90 tablet 3   SYSTANE BALANCE 0.6 % SOLN SMARTSIG:1 Drop(s) In Eye(s) PRN     Travoprost, BAK Free, (TRAVATAN) 0.004 % SOLN ophthalmic solution Place 1 drop into both eyes at bedtime.      Facility-Administered Medications Prior to Visit  Medication Dose Route Frequency Provider Last Rate Last Admin   0.9 %  sodium chloride infusion  500 mL Intravenous Once Meryl Dare, MD         Review of Systems:   Constitutional: No weight loss or gain, night sweats, fevers, chills, or lassitude. +fatigue  HEENT: No headaches, difficulty swallowing, tooth/dental problems, or sore throat. No sneezing, itching, ear ache, nasal congestion, or post nasal drip CV:  No chest pain, orthopnea, PND, swelling in lower extremities, anasarca, dizziness, palpitations, syncope Resp: +snoring. No shortness of breath with exertion or at rest. No excess mucus or change in color of  mucus. No productive or non-productive. No hemoptysis. No wheezing.  No chest wall deformity GI:  No heartburn, indigestion GU: No dysuria, change in color of urine, urgency or frequency, nocturia.   Skin: No rash, lesions, ulcerations MSK:  No joint pain or swelling.   Neuro: No dizziness or lightheadedness.  Psych: No depression or anxiety. Mood stable. +sleep disturbance     Physical Exam:  BP 138/80 (BP Location: Left Arm, Cuff Size: Large)   Pulse 64   Temp (!) 97.3 F (36.3 C)   Ht 5\' 5"  (1.651 m)   Wt 201 lb 9.6 oz (91.4 kg)   SpO2 98%   BMI 33.55 kg/m   GEN: Pleasant, interactive, well-appearing; obese; in no acute distress. HEENT:  Normocephalic and atraumatic. PERRLA. Sclera white. Nasal turbinates pink, moist and patent bilaterally. No rhinorrhea present. Oropharynx pink and moist, without exudate or edema. No lesions, ulcerations, or postnasal drip. Mallampati IV NECK:  Supple w/  fair ROM. No JVD present. Normal carotid impulses w/o bruits. Thyroid symmetrical with no goiter or nodules palpated. No lymphadenopathy.   CV: RRR, no m/r/g, no peripheral edema. Pulses intact, +2 bilaterally. No cyanosis, pallor or clubbing. PULMONARY:  Unlabored, regular breathing. Clear bilaterally A&P w/o wheezes/rales/rhonchi. No accessory muscle use.  GI: BS present and normoactive. Soft, non-tender to palpation. No organomegaly or masses detected. MSK: No erythema, warmth or tenderness. Cap refil <2 sec all extrem. No deformities or joint swelling noted.  Neuro: A/Ox3. No focal deficits noted.   Skin: Warm, no lesions or rashe Psych: Normal affect and behavior. Judgement and thought content appropriate.     Lab Results:  CBC    Component Value Date/Time   WBC 6.0 11/24/2022 1123   RBC 4.62 11/24/2022 1123   HGB 14.9 11/24/2022 1123   HCT 43.3 11/24/2022 1123   PLT 236.0 11/24/2022 1123   MCV 93.7 11/24/2022 1123   MCH 28.6 02/18/2017 1552   MCHC 34.4 11/24/2022 1123   RDW  13.1 11/24/2022 1123   LYMPHSABS 2.0 11/24/2022 1123   MONOABS 0.4 11/24/2022 1123   EOSABS 0.1 11/24/2022 1123   BASOSABS 0.0 11/24/2022 1123    BMET    Component Value Date/Time   NA 141 11/24/2022 1123   NA 139 03/18/2015 0000   K 4.5 11/24/2022 1123   CL 103 11/24/2022 1123   CO2 26 11/24/2022 1123   GLUCOSE 90 11/24/2022 1123   BUN 23 11/24/2022 1123   BUN 19 03/18/2015 0000   CREATININE 0.86 11/24/2022 1123   CREATININE 0.74 02/18/2017 1552   CALCIUM 10.0 11/24/2022 1123   GFRNONAA >60 01/05/2017 1323   GFRAA >60 01/05/2017 1323    BNP No results found for: "BNP"   Imaging:  No results found.       Latest Ref Rng & Units 09/01/2022    8:52 AM  PFT Results  FVC-Pre L 3.41   FVC-Predicted Pre % 107   FVC-Post L 3.54   FVC-Predicted Post % 111   Pre FEV1/FVC % % 69   Post FEV1/FCV % % 72   FEV1-Pre L 2.34   FEV1-Predicted Pre % 97   FEV1-Post L 2.56   DLCO uncorrected ml/min/mmHg 19.83   DLCO UNC% % 96   DLCO corrected ml/min/mmHg 19.83   DLCO COR %Predicted % 96   DLVA Predicted % 85   TLC L 5.50   TLC % Predicted % 104   RV % Predicted % 79     No results found for: "NITRICOXIDE"      Assessment & Plan:   Mild obstructive sleep apnea Mild OSA with AHI 11.8/h. We reviewed risks of untreated OSA and potential treatment options. She is interested in either CPAP or oral appliance. She is going to contact her insurance to see if oral appliance would be covered. If not, she will move forward with CPAP therapy. She will contact us afterwards to let us know what she prefers. Cautioned on safe driving practices.  Patient Instructions  You have mild sleep apnea based on your previous sleep study. We discussed how untreated sleep apnea puts an individual at risk for cardiac arrhthymias, pulm HTN, DM, stroke and increases their risk for daytime accidents; although, these are lessened with mild severity. We also briefly reviewed treatment options  including weight loss, side sleeping position, oral appliance, CPAP therapy   Let me know what you decide about oral appliance or CPAP  If you start CPAP... Auto CPAP  5-15 cmH2O with mask of choice - wear every night, minimum of 4-6 hours a night.  Change equipment every 30 days or as directed by DME. Wash your tubing with warm soap and water daily, hang to dry. Wash humidifier portion weekly.  Be aware of reduced alertness and do not drive or operate heavy machinery if experiencing this or drowsiness.   Follow up in 8-12 weeks with Dr. Wynona Neat or Philis Nettle at [facility] office, or sooner, if needed. If you decide to go with an oral appliance, we will change this to 6 months.    I spent 32 minutes of dedicated to the care of this patient on the date of this encounter to include pre-visit review of records, face-to-face time with the patient discussing conditions above, post visit ordering of testing, clinical documentation with the electronic health record, making appropriate referrals as documented, and communicating necessary findings to members of the patients care team.  Noemi Chapel, NP 03/02/2023  Pt aware and understands NP's role.

## 2023-03-02 NOTE — Assessment & Plan Note (Signed)
Mild OSA with AHI 11.8/h. We reviewed risks of untreated OSA and potential treatment options. She is interested in either CPAP or oral appliance. She is going to contact her insurance to see if oral appliance would be covered. If not, she will move forward with CPAP therapy. She will contact us afterwards to let us know what she prefers. Cautioned on safe driving practices.  Patient Instructions  You have mild sleep apnea based on your previous sleep study. We discussed how untreated sleep apnea puts an individual at risk for cardiac arrhthymias, pulm HTN, DM, stroke and increases their risk for daytime accidents; although, these are lessened with mild severity. We also briefly reviewed treatment options including weight loss, side sleeping position, oral appliance, CPAP therapy   Let me know what you decide about oral appliance or CPAP  If you start CPAP... Auto CPAP 5-15 cmH2O with mask of choice - wear every night, minimum of 4-6 hours a night.  Change equipment every 30 days or as directed by DME. Wash your tubing with warm soap and water daily, hang to dry. Wash humidifier portion weekly.  Be aware of reduced alertness and do not drive or operate heavy machinery if experiencing this or drowsiness.   Follow up in 8-12 weeks with Dr. Wynona Neat or Philis Nettle at [facility] office, or sooner, if needed. If you decide to go with an oral appliance, we will change this to 6 months.

## 2023-03-02 NOTE — Patient Instructions (Signed)
You have mild sleep apnea based on your previous sleep study. We discussed how untreated sleep apnea puts an individual at risk for cardiac arrhthymias, pulm HTN, DM, stroke and increases their risk for daytime accidents; although, these are lessened with mild severity. We also briefly reviewed treatment options including weight loss, side sleeping position, oral appliance, CPAP therapy   Let me know what you decide about oral appliance or CPAP  If you start CPAP... Auto CPAP 5-15 cmH2O with mask of choice - wear every night, minimum of 4-6 hours a night.  Change equipment every 30 days or as directed by DME. Wash your tubing with warm soap and water daily, hang to dry. Wash humidifier portion weekly.  Be aware of reduced alertness and do not drive or operate heavy machinery if experiencing this or drowsiness.   Follow up in 8-12 weeks with Dr. Wynona Neat or Philis Nettle at [facility] office, or sooner, if needed. If you decide to go with an oral appliance, we will change this to 6 months.

## 2023-03-03 ENCOUNTER — Telehealth: Payer: Self-pay | Admitting: Nurse Practitioner

## 2023-03-03 DIAGNOSIS — G4733 Obstructive sleep apnea (adult) (pediatric): Secondary | ICD-10-CM

## 2023-03-03 NOTE — Telephone Encounter (Signed)
PT calling because [name] said she could refer her to a couple of Dentist's that could fit her for a "apparatus"/ appliance.  Pls call @ [phone]

## 2023-03-04 DIAGNOSIS — Z1231 Encounter for screening mammogram for malignant neoplasm of breast: Secondary | ICD-10-CM | POA: Diagnosis not present

## 2023-03-04 NOTE — Telephone Encounter (Signed)
I spoke with the patient. She wants to see what the cost are for each option, oral appliance and CPAP.  Do you know how she would go about doing that? Do you know which option is normally cheaper?  I told her you are out of the office until Monday. She is ok with waiting until Monday to hear back.

## 2023-03-04 NOTE — Telephone Encounter (Signed)
PT calling saying [name] [name] was supposed to get back to her today with dental appliance information/referral. Pls call @ [phone]

## 2023-03-07 NOTE — Telephone Encounter (Signed)
ATC the patient. LVM for the patient to return my call. 

## 2023-03-07 NOTE — Telephone Encounter (Signed)
Best place to start is her insurance company. Our plan from her visit was to find out if they would cover oral appliance and if so, I would send a referral to orthodontics. If her insurance will not cover oral appliance for OSA, CPAP  is likely going to be the more affordable option. Thanks!

## 2023-03-07 NOTE — Telephone Encounter (Signed)
I have notified the patient. Nothing further needed. 

## 2023-03-07 NOTE — Telephone Encounter (Signed)
I sent a referral to Dr. Toni Arthurs. Her office will contact her to schedule a consultation appt. Thanks!

## 2023-03-07 NOTE — Telephone Encounter (Signed)
I spoke with the patient. She would like start with a referral for the oral appliance so she can see what the price of it will be. She would like to know who you refer to for the oral appliance?

## 2023-03-18 DIAGNOSIS — H43813 Vitreous degeneration, bilateral: Secondary | ICD-10-CM | POA: Diagnosis not present

## 2023-03-18 DIAGNOSIS — H401132 Primary open-angle glaucoma, bilateral, moderate stage: Secondary | ICD-10-CM | POA: Diagnosis not present

## 2023-03-18 DIAGNOSIS — H35433 Paving stone degeneration of retina, bilateral: Secondary | ICD-10-CM | POA: Diagnosis not present

## 2023-03-18 DIAGNOSIS — Z961 Presence of intraocular lens: Secondary | ICD-10-CM | POA: Diagnosis not present

## 2023-04-15 DIAGNOSIS — H401122 Primary open-angle glaucoma, left eye, moderate stage: Secondary | ICD-10-CM | POA: Diagnosis not present

## 2023-04-15 DIAGNOSIS — H401112 Primary open-angle glaucoma, right eye, moderate stage: Secondary | ICD-10-CM | POA: Diagnosis not present

## 2023-04-15 DIAGNOSIS — H401132 Primary open-angle glaucoma, bilateral, moderate stage: Secondary | ICD-10-CM | POA: Diagnosis not present

## 2023-04-26 DIAGNOSIS — M1711 Unilateral primary osteoarthritis, right knee: Secondary | ICD-10-CM | POA: Diagnosis not present

## 2023-04-27 ENCOUNTER — Telehealth: Payer: Self-pay | Admitting: Family Medicine

## 2023-04-27 NOTE — Telephone Encounter (Signed)
[facility] - Visit Note (SOAP)

## 2023-04-28 DIAGNOSIS — Z961 Presence of intraocular lens: Secondary | ICD-10-CM | POA: Diagnosis not present

## 2023-04-28 DIAGNOSIS — H18413 Arcus senilis, bilateral: Secondary | ICD-10-CM | POA: Diagnosis not present

## 2023-04-28 DIAGNOSIS — H401132 Primary open-angle glaucoma, bilateral, moderate stage: Secondary | ICD-10-CM | POA: Diagnosis not present

## 2023-04-28 DIAGNOSIS — H43813 Vitreous degeneration, bilateral: Secondary | ICD-10-CM | POA: Diagnosis not present

## 2023-05-04 ENCOUNTER — Encounter (INDEPENDENT_AMBULATORY_CARE_PROVIDER_SITE_OTHER): Payer: Self-pay

## 2023-05-04 DIAGNOSIS — Z85828 Personal history of other malignant neoplasm of skin: Secondary | ICD-10-CM | POA: Diagnosis not present

## 2023-05-04 DIAGNOSIS — L570 Actinic keratosis: Secondary | ICD-10-CM | POA: Diagnosis not present

## 2023-05-04 DIAGNOSIS — L821 Other seborrheic keratosis: Secondary | ICD-10-CM | POA: Diagnosis not present

## 2023-05-04 DIAGNOSIS — D485 Neoplasm of uncertain behavior of skin: Secondary | ICD-10-CM | POA: Diagnosis not present

## 2023-05-04 DIAGNOSIS — Z129 Encounter for screening for malignant neoplasm, site unspecified: Secondary | ICD-10-CM | POA: Diagnosis not present

## 2023-05-04 DIAGNOSIS — Z86008 Personal history of in-situ neoplasm of other site: Secondary | ICD-10-CM | POA: Diagnosis not present

## 2023-05-04 DIAGNOSIS — Z85820 Personal history of malignant melanoma of skin: Secondary | ICD-10-CM | POA: Diagnosis not present

## 2023-05-18 ENCOUNTER — Other Ambulatory Visit: Payer: Self-pay | Admitting: Family Medicine

## 2023-05-25 ENCOUNTER — Other Ambulatory Visit: Payer: Self-pay

## 2023-05-25 MED ORDER — NP THYROID 60 MG PO TABS: ORAL_TABLET | ORAL | 1 refills | Status: DC

## 2023-05-27 DIAGNOSIS — H401132 Primary open-angle glaucoma, bilateral, moderate stage: Secondary | ICD-10-CM | POA: Diagnosis not present

## 2023-05-31 ENCOUNTER — Ambulatory Visit: Payer: Medicare HMO | Admitting: Pulmonary Disease

## 2023-05-31 VITALS — BP 130/78 | HR 76 | Temp 97.7°F | Ht 65.0 in | Wt 196.8 lb

## 2023-05-31 DIAGNOSIS — E119 Type 2 diabetes mellitus without complications: Secondary | ICD-10-CM | POA: Diagnosis not present

## 2023-05-31 DIAGNOSIS — G4733 Obstructive sleep apnea (adult) (pediatric): Secondary | ICD-10-CM | POA: Diagnosis not present

## 2023-05-31 NOTE — Progress Notes (Signed)
[name] [name]    [id number]    07-25-1951  Primary Care Physician:Tabori, Helane Rima, MD  Referring Physician: Sheliah Hatch, MD [address] [place],  [place] [address]  Chief complaint:   Mild obstructive sleep apnea  HPI:  Patient with mild obstructive sleep apnea recently started using an oral device Has been using it for about a week now  Still getting adjusted to it  Has not noticed any significant change in the symptoms  No significant shortness of breath Fatigue on some days  Not really very sleepy during the day No drowsy driving   Outpatient Encounter Medications as of 05/31/2023  Medication Sig   aspirin 325 MG tablet Take 325 mg by mouth daily.   B Complex-C (SUPER B COMPLEX PO) Take 1 tablet by mouth daily.    brimonidine (ALPHAGAN) 0.2 % ophthalmic solution 1 drop 3 (three) times daily.   cetirizine (ZYRTEC) 10 MG tablet Take 10 mg by mouth daily.   Cholecalciferol (VITAMIN D3) 5000 UNITS CAPS Take 5,000 Units by mouth daily.    Coenzyme Q10 (COQ10) 200 MG CAPS Take 1 capsule by mouth daily.    doxycycline (ADOXA) 50 MG tablet Take 50 mg by mouth daily. For rosacea   losartan (COZAAR) 50 MG tablet TAKE 1 TABLET EVERY DAY   Magnesium 200 MG TABS Take 200 mg by mouth in the morning and at bedtime.    Multiple Vitamins-Minerals (MULTIVITAMIN PO) Take 1 tablet by mouth daily.   NP THYROID 60 MG tablet TAKE 1 TABLET(60 MG) BY MOUTH DAILY BEFORE BREAKFAST   omeprazole (PRILOSEC) 20 MG capsule TAKE 1 CAPSULE EVERY DAY   simvastatin (ZOCOR) 20 MG tablet TAKE 1 TABLET AT BEDTIME   SYSTANE BALANCE 0.6 % SOLN SMARTSIG:1 Drop(s) In Eye(s) PRN   Travoprost, BAK Free, (TRAVATAN) 0.004 % SOLN ophthalmic solution Place 1 drop into both eyes at bedtime.    Facility-Administered Encounter Medications as of 05/31/2023  Medication   0.9 %  sodium chloride infusion    Allergies as of 05/31/2023 - Review Complete 03/02/2023  Allergen Reaction Noted    Clindamycin/lincomycin Diarrhea 06/20/2013   Flagyl [metronidazole hcl] Diarrhea 10/15/2011   Augmentin [amoxicillin-pot clavulanate] Diarrhea 06/20/2013   Telithromycin Diarrhea     Past Medical History:  Diagnosis Date   Allergy 12/19/2007   Bronchitis    Cataract    Cognitive dysfunction 07/14/2022   Coronary artery calcification seen on CAT scan 04/09/2021   DJD (degenerative joint disease)    DOE (dyspnea on exertion) 04/09/2021   GERD (gastroesophageal reflux disease) 12/19/2007   Glaucoma    History of adenomatous colonic polyps 01/17/2017   Hypercholesterolemia    Hypothyroidism    IBS (irritable bowel syndrome)    Malignant melanoma of neck    Mixed hyperlipidemia 04/09/2021   OSA (obstructive sleep apnea) 12/27/2017   no CPAP   Osteoporosis    Plantar fasciitis    Postoperative nausea and vomiting 09/02/2015   Precordial pain 04/09/2021   Presence of shoulder implant 01/21/2021   Primary hypertension 12/19/2007   Primary localized osteoarthritis of left knee    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/21/2021   Tremor of both hands 05/21/2020   Vitamin D deficiency 09/21/2009    Past Surgical History:  Procedure Laterality Date   ANAL RECTAL MANOMETRY N/A 11/16/2019   Procedure: ANO RECTAL MANOMETRY;  Surgeon: Napoleon Form, MD;  Location: [facility];  Service:  Endoscopy;  Laterality: N/A;   BUNIONECTOMY  2004   left   CATARACT EXTRACTION W/ INTRAOCULAR LENS  IMPLANT, BILATERAL     COLONOSCOPY     EPIDURAL CATHETER INSERTION     L7   FOOT OSTEOTOMY  2003   right   HERNIA REPAIR  1957   JOINT REPLACEMENT     KNEE ARTHROSCOPY  1987&2003   left   MELANOMA EXCISION Right    shoulder   POLYPECTOMY     REVERSE SHOULDER ARTHROPLASTY Right 01/21/2021   Procedure: REVERSE SHOULDER ARTHROPLASTY;  Surgeon: Bjorn Pippin, MD;  Location: [facility];  Service: Orthopedics;  Laterality: Right;   SHOULDER ARTHROSCOPY WITH OPEN  ROTATOR CUFF REPAIR  2013   right   SKIN SURGERY  01/2004   Melanoma surg left shoulder   TOENAIL EXCISION  10/04/2017   TOTAL KNEE ARTHROPLASTY Left 09/01/2015   Procedure: TOTAL KNEE ARTHROPLASTY;  Surgeon: Salvatore Marvel, MD;  Location: [facility] OR;  Service: Orthopedics;  Laterality: Left;   TUBAL LIGATION  1990    Family History  Problem Relation Age of Onset   Tremor Mother        no PD   Esophageal cancer Father    Colon polyps Father    Cancer Father        laryngeal   Heart disease Father    Osteoporosis Sister    Hypertension Brother    Heart disease Maternal Grandfather    Heart disease Paternal Grandfather    Diabetes Son    Tremor Maternal Aunt    Alzheimer's disease Maternal Aunt    Colon cancer Neg Hx    Stomach cancer Neg Hx    Crohn's disease Neg Hx    Rectal cancer Neg Hx     Social History   Socioeconomic History   Marital status: Married    Spouse name: Tammy Sours [phone]   Number of children: 2   Years of education: 67   Highest education level: 12th grade  Occupational History   Occupation: retired  Tobacco Use   Smoking status: Former    Current packs/day: 0.00    Types: Cigarettes    Start date: 10/04/1969    Quit date: 10/05/1975    Years since quitting: 47.6    Passive exposure: Past   Smokeless tobacco: Never  Vaping Use   Vaping status: Never Used  Substance and Sexual Activity   Alcohol use: Yes    Alcohol/week: 7.0 standard drinks of alcohol    Types: 7 Standard drinks or equivalent per week    Comment: 1 beverage nightly   Drug use: No   Sexual activity: Not on file  Other Topics Concern   Not on file  Social History Narrative   Right handed   Drinks caffeine, tea   Retired   Lives with her husband   Social Determinants of Corporate investment banker Strain: Low Risk  (05/31/2023)   Overall Financial Resource Strain (CARDIA)    Difficulty of Paying Living Expenses: Not hard at all  Food Insecurity: No Food Insecurity  (05/31/2023)   Hunger Vital Sign    Worried About Running Out of Food in the Last Year: Never true    Ran Out of Food in the Last Year: Never true  Transportation Needs: No Transportation Needs (05/31/2023)   PRAPARE - Administrator, Civil Service (Medical): No    Lack of Transportation (Non-Medical): No  Physical Activity: Insufficiently Active (05/31/2023)  Exercise Vital Sign    Days of Exercise per Week: 5 days    Minutes of Exercise per Session: 20 min  Stress: No Stress Concern Present (05/31/2023)   [employer] of Occupational Health - Occupational Stress Questionnaire    Feeling of Stress : Not at all  Social Connections: Moderately Integrated (05/31/2023)   Social Connection and Isolation Panel [NHANES]    Frequency of Communication with Friends and Family: Three times a week    Frequency of Social Gatherings with Friends and Family: Once a week    Attends Religious Services: Never    Database administrator or Organizations: Yes    Attends Engineer, structural: More than 4 times per year    Marital Status: Married  Catering manager Violence: Not At Risk (12/16/2022)   Humiliation, Afraid, Rape, and Kick questionnaire    Fear of Current or Ex-Partner: No    Emotionally Abused: No    Physically Abused: No    Sexually Abused: No    Review of Systems  Respiratory:  Positive for apnea.   Psychiatric/Behavioral:  Positive for sleep disturbance.     There were no vitals filed for this visit.   Physical Exam Constitutional:      Appearance: She is obese.  HENT:     Head: Normocephalic.     Mouth/Throat:     Mouth: Mucous membranes are moist.  Eyes:     General: No scleral icterus. Cardiovascular:     Rate and Rhythm: Normal rate and regular rhythm.     Heart sounds: No murmur heard.    No friction rub.  Pulmonary:     Effort: No respiratory distress.     Breath sounds: No stridor. No wheezing or rhonchi.  Musculoskeletal:     Cervical  back: No rigidity.  Neurological:     Mental Status: She is alert.   Epworth Sleepiness Scale 3   Data Reviewed: Sleep study results showing mild obstructive sleep apnea-AHI of 11.8 mild obstructive sleep apnea  PFT with no obstruction, no restriction and normal diffusing capacity Assessment:  Mild obstructive sleep apnea -Has been using an oral device for about a week now -Has not noticed any significant difference in her symptoms  Hypertension  Diabetes   Plan/Recommendations: Continue using oral device  Follow-up with Dr. Toni Arthurs  Follow-up in the office in about 6 months  Continue weight loss efforts   Virl Diamond MD [facility] 05/31/2023, 1:19 PM  CC: Sheliah Hatch, MD

## 2023-05-31 NOTE — Patient Instructions (Signed)
Follow-up in 6 months  Keep follow-up with Dr. Toni Arthurs  Call us with significant concerns  He does take a little bit of time to get used to using the oral device, expectation is that your sleep will be better consolidated given you more restful sleep

## 2023-06-01 ENCOUNTER — Encounter: Payer: Self-pay | Admitting: Family Medicine

## 2023-06-01 ENCOUNTER — Ambulatory Visit (HOSPITAL_BASED_OUTPATIENT_CLINIC_OR_DEPARTMENT_OTHER)
Admission: RE | Admit: 2023-06-01 | Discharge: 2023-06-01 | Disposition: A | Discharge status: Home / Self Care | Payer: Medicare HMO | Source: Ambulatory Visit | Attending: Family Medicine | Admitting: Family Medicine

## 2023-06-01 ENCOUNTER — Ambulatory Visit (INDEPENDENT_AMBULATORY_CARE_PROVIDER_SITE_OTHER): Payer: Medicare HMO | Admitting: Family Medicine

## 2023-06-01 VITALS — BP 120/76 | HR 71 | Temp 98.9°F | Resp 18 | Ht 65.0 in | Wt 195.4 lb

## 2023-06-01 DIAGNOSIS — I6522 Occlusion and stenosis of left carotid artery: Secondary | ICD-10-CM | POA: Insufficient documentation

## 2023-06-01 DIAGNOSIS — Z23 Encounter for immunization: Secondary | ICD-10-CM | POA: Diagnosis not present

## 2023-06-01 DIAGNOSIS — E782 Mixed hyperlipidemia: Secondary | ICD-10-CM

## 2023-06-01 DIAGNOSIS — I10 Essential (primary) hypertension: Secondary | ICD-10-CM

## 2023-06-01 DIAGNOSIS — I6523 Occlusion and stenosis of bilateral carotid arteries: Secondary | ICD-10-CM | POA: Diagnosis not present

## 2023-06-01 DIAGNOSIS — E669 Obesity, unspecified: Secondary | ICD-10-CM

## 2023-06-01 LAB — TSH: TSH: 1.79 u[IU]/mL (ref 0.35–5.50)

## 2023-06-01 LAB — CBC WITH DIFFERENTIAL/PLATELET
Basophils Absolute: 0 10*3/uL (ref 0.0–0.1)
Basophils Relative: 0.6 % (ref 0.0–3.0)
Eosinophils Absolute: 0.1 10*3/uL (ref 0.0–0.7)
Eosinophils Relative: 1.6 % (ref 0.0–5.0)
HCT: 42 % (ref 36.0–46.0)
Hemoglobin: 13.9 g/dL (ref 12.0–15.0)
Lymphocytes Relative: 30.9 % (ref 12.0–46.0)
Lymphs Abs: 1.7 10*3/uL (ref 0.7–4.0)
MCHC: 33.1 g/dL (ref 30.0–36.0)
MCV: 93.4 fl (ref 78.0–100.0)
Monocytes Absolute: 0.4 10*3/uL (ref 0.1–1.0)
Monocytes Relative: 6.8 % (ref 3.0–12.0)
Neutro Abs: 3.3 10*3/uL (ref 1.4–7.7)
Neutrophils Relative %: 60.1 % (ref 43.0–77.0)
Platelets: 259 10*3/uL (ref 150.0–400.0)
RBC: 4.49 Mil/uL (ref 3.87–5.11)
RDW: 13.1 % (ref 11.5–15.5)
WBC: 5.5 10*3/uL (ref 4.0–10.5)

## 2023-06-01 LAB — BASIC METABOLIC PANEL
BUN: 17 mg/dL (ref 6–23)
CO2: 28 mEq/L (ref 19–32)
Calcium: 9.4 mg/dL (ref 8.4–10.5)
Chloride: 104 mEq/L (ref 96–112)
Creatinine, Ser: 0.82 mg/dL (ref 0.40–1.20)
GFR: 71.92 mL/min (ref >60.00)
Glucose, Bld: 106 mg/dL — ABNORMAL HIGH (ref 70–99)
Potassium: 4.3 mEq/L (ref 3.5–5.1)
Sodium: 140 mEq/L (ref 135–145)

## 2023-06-01 LAB — LIPID PANEL
Cholesterol: 144 mg/dL (ref 0–200)
HDL: 56 mg/dL (ref >39.00)
LDL Cholesterol: 66 mg/dL (ref 0–99)
NonHDL: 88.4
Total CHOL/HDL Ratio: 3
Triglycerides: 111 mg/dL (ref 0.0–149.0)
VLDL: 22.2 mg/dL (ref 0.0–40.0)

## 2023-06-01 LAB — HEPATIC FUNCTION PANEL
ALT: 34 U/L (ref 0–35)
AST: 32 U/L (ref 0–37)
Albumin: 4.1 g/dL (ref 3.5–5.2)
Alkaline Phosphatase: 93 U/L (ref 39–117)
Bilirubin, Direct: 0.2 mg/dL (ref 0.0–0.3)
Total Bilirubin: 0.8 mg/dL (ref 0.2–1.2)
Total Protein: 6.6 g/dL (ref 6.0–8.3)

## 2023-06-01 NOTE — Patient Instructions (Signed)
Schedule your complete physical in 6 months We'll notify you of your lab results and make any changes if needed We'll call you to schedule your carotid ultrasound Continue to work on healthy diet and regular exercise- you can do it! Call with any questions or concerns Stay Safe!  Stay Healthy! Happy Labor Day!!!

## 2023-06-01 NOTE — Assessment & Plan Note (Signed)
Pt is down 7 lbs since last visit.  She spent 2 weeks walking around [place] and was much more active than usual.  Encouraged her to keep up her physical activity.  Will continue to follow.

## 2023-06-01 NOTE — Assessment & Plan Note (Signed)
Chronic problem.  Currently well controlled on Losartan 50mg  daily.  Asymptomatic.  Check labs due to ARB use but no anticipated med changes.

## 2023-06-01 NOTE — Assessment & Plan Note (Signed)
New.  Seen on imaging done by [facility].  Will get carotid dopplers to assess.  Pt expressed understanding and is in agreement w/ plan.

## 2023-06-01 NOTE — Progress Notes (Signed)
   Subjective:    Patient ID: [name] [name], female    DOB: [birth year], 72 y.o.   MRN: [id number]  HPI HTN- chronic problem, on Losartn 50mg  daily w/ good control.  No CP, SOB, HA's, visual changes, edema.  Hyperlipidemia- chronic problem, on Simvastatin 20mg  daily.  No abd pain, N/V.  Obesity- she is down 7 lbs since last visit.  Spent 2 weeks walking in [place].  Carotid calcification- noted on imaging done at [facility]   Review of Systems For ROS see HPI     Objective:   Physical Exam Vitals reviewed.  Constitutional:      General: She is not in acute distress.    Appearance: Normal appearance. She is well-developed. She is obese. She is not ill-appearing.  HENT:     Head: Normocephalic and atraumatic.  Eyes:     Conjunctiva/sclera: Conjunctivae normal.     Pupils: Pupils are equal, round, and reactive to light.  Neck:     Thyroid: No thyromegaly.  Cardiovascular:     Rate and Rhythm: Normal rate and regular rhythm.     Pulses: Normal pulses.     Heart sounds: Normal heart sounds. No murmur heard. Pulmonary:     Effort: Pulmonary effort is normal. No respiratory distress.     Breath sounds: Normal breath sounds.  Abdominal:     General: There is no distension.     Palpations: Abdomen is soft.     Tenderness: There is no abdominal tenderness.  Musculoskeletal:     Cervical back: Normal range of motion and neck supple.     Right lower leg: No edema.     Left lower leg: No edema.  Lymphadenopathy:     Cervical: No cervical adenopathy.  Skin:    General: Skin is warm and dry.  Neurological:     General: No focal deficit present.     Mental Status: She is alert and oriented to person, place, and time.  Psychiatric:        Mood and Affect: Mood normal.        Behavior: Behavior normal.        Thought Content: Thought content normal.           Assessment & Plan:

## 2023-06-01 NOTE — Assessment & Plan Note (Signed)
Chronic problem.  On Simvastatin 20mg daily w/o difficulty.  Check labs.  Adjust meds prn  

## 2023-06-02 ENCOUNTER — Telehealth: Payer: Self-pay

## 2023-06-02 NOTE — Telephone Encounter (Signed)
-----   Message from Neena Rhymes sent at 06/02/2023  7:28 AM EDT ----- Minimal plaque seen on ultrasound.  This is great news!  No changes at this time

## 2023-06-02 NOTE — Telephone Encounter (Signed)
Left results on pt VM  

## 2023-06-02 NOTE — Telephone Encounter (Signed)
-----   Message from Neena Rhymes sent at 06/02/2023  7:30 AM EDT ----- Labs look great!  No changes at this time

## 2023-06-07 NOTE — Addendum Note (Signed)
Addended byJaci Lazier, Arelene Moroni on: 06/07/2023 12:54 PM   Modules accepted: Orders

## 2023-06-08 DIAGNOSIS — Q828 Other specified congenital malformations of skin: Secondary | ICD-10-CM | POA: Diagnosis not present

## 2023-06-08 DIAGNOSIS — D0471 Carcinoma in situ of skin of right lower limb, including hip: Secondary | ICD-10-CM | POA: Diagnosis not present

## 2023-06-08 DIAGNOSIS — L570 Actinic keratosis: Secondary | ICD-10-CM | POA: Diagnosis not present

## 2023-06-08 DIAGNOSIS — D485 Neoplasm of uncertain behavior of skin: Secondary | ICD-10-CM | POA: Diagnosis not present

## 2023-06-08 DIAGNOSIS — L821 Other seborrheic keratosis: Secondary | ICD-10-CM | POA: Diagnosis not present

## 2023-06-13 DIAGNOSIS — C434 Malignant melanoma of scalp and neck: Secondary | ICD-10-CM | POA: Diagnosis not present

## 2023-06-13 DIAGNOSIS — R918 Other nonspecific abnormal finding of lung field: Secondary | ICD-10-CM | POA: Diagnosis not present

## 2023-06-20 DIAGNOSIS — C434 Malignant melanoma of scalp and neck: Secondary | ICD-10-CM | POA: Diagnosis not present

## 2023-06-20 DIAGNOSIS — R918 Other nonspecific abnormal finding of lung field: Secondary | ICD-10-CM | POA: Diagnosis not present

## 2023-07-13 ENCOUNTER — Encounter: Payer: Medicare HMO | Admitting: Pharmacist

## 2023-07-13 ENCOUNTER — Telehealth: Payer: Medicare HMO

## 2023-07-30 ENCOUNTER — Other Ambulatory Visit: Payer: Self-pay | Admitting: Family Medicine

## 2023-07-30 DIAGNOSIS — K219 Gastro-esophageal reflux disease without esophagitis: Secondary | ICD-10-CM

## 2023-08-16 ENCOUNTER — Ambulatory Visit: Payer: Medicare HMO | Admitting: Physician Assistant

## 2023-08-16 ENCOUNTER — Encounter: Payer: Self-pay | Admitting: Physician Assistant

## 2023-08-16 VITALS — BP 151/86 | HR 82 | Resp 20 | Ht 65.0 in

## 2023-08-16 DIAGNOSIS — R413 Other amnesia: Secondary | ICD-10-CM

## 2023-08-16 DIAGNOSIS — R251 Tremor, unspecified: Secondary | ICD-10-CM | POA: Diagnosis not present

## 2023-08-16 NOTE — Patient Instructions (Signed)
Follow up in 6 months Repeat the Neuropsychological evaluation  Follow up with pulmonary for sleep apnea

## 2023-08-16 NOTE — Progress Notes (Signed)
Assessment/Plan:   Memory Impairment   [name] [name] is a very pleasant 72 y.o. RH female with a history of Mild cognitive impairment per neuropsych evaluation 07/2022 likely due to vascular disease and untreated OSA (and not concerning for PD or AD at this time) presenting today in follow-up for evaluation of memory loss. Patient is not on antidementia meds at this time. Memory is stable.Patient is able to participate on his ADLs and to drive without difficulties. MMSE today is 30/30 Her tremors continue to be minimal and stable from prior visit.       Recommendations:   Follow up in 6 months. Repeat Neuropsych testing for clarity of diagnosis and disease trajectory.     No indication for antidementia medication at this time Recommend good control of cardiovascular risk factors Continue to control mood as per PCP    Subjective:   This patient is here alone. Previous records as well as any outside records available were reviewed prior to todays visit.   Patient was last seen on  03/01/23     Any changes in memory since last visit? " About the same". Continues to have some difficulty "inserting the right word but easier than prior" in the sentence. Denies difficulty remembering recent conversations and names of people. Likes to do crossword puzzles and word finding.  repeats oneself?  Denies Disoriented when walking into a room?  Patient denies    Misplacing objects?  "Losing things here and there, such as the water bottle".   Wandering behavior?   Denies.   Any personality changes since last visit?   Denies.   Any worsening depression?: Lost a dear family member and the presidential election which brought sadness to her Hallucinations or paranoia?  Denies.  Seizures?   Denies.    Any sleep changes? Sleeps well for  the most part, takes melatonin as needed, occasional vivid dreams, denies REM behavior or sleepwalking.   Sleep apnea?  Endorsed, mild, on a mouth piece nightly.   She is followed by pulmonary.   Any hygiene concerns?   denies   Independent of bathing and dressing?  Endorsed  Does the patient needs help with medications? Patient is in charge   Who is in charge of the finances?  Patient is in charge     Any changes in appetite?  Denies.     Patient have trouble swallowing?  denies   Does the patient cook? Endorsed, not a lot.   Any kitchen accidents such as leaving the stove on?   denies   Any headaches?    denies   Vision changes? denies Chronic pain?  She has received  C5-7 injections, does not want further ones, prefers physical therapy and staying active.  Ambulates with difficulty?    Denies.    Recent falls or head injuries?    Denies.      Unilateral weakness, numbness or tingling?   denies   Any tremors? Minimal ET, Bilateral, R>L , no new issues. No other parkinsonian signs.  Any anosmia?    Denies.   Any incontinence of urine? She has a history of incomplete emptying  Any bowel dysfunction? Endorsed, has diarrhea, due to anal incontinence  Patient lives with   her husband Does the patient drive?  Yes, denies getting lost.  Past Medical History:  Diagnosis Date   Allergy 12/19/2007   Bronchitis    Cataract    Cognitive dysfunction 07/14/2022   Coronary artery calcification seen on CAT scan  04/09/2021   DJD (degenerative joint disease)    DOE (dyspnea on exertion) 04/09/2021   GERD (gastroesophageal reflux disease) 12/19/2007   Glaucoma    History of adenomatous colonic polyps 01/17/2017   Hypercholesterolemia    Hypothyroidism    IBS (irritable bowel syndrome)    Malignant melanoma of neck    Mixed hyperlipidemia 04/09/2021   OSA (obstructive sleep apnea) 12/27/2017   no CPAP   Osteoporosis    Plantar fasciitis    Postoperative nausea and vomiting 09/02/2015   Precordial pain 04/09/2021   Presence of shoulder implant 01/21/2021   Primary hypertension 12/19/2007   Primary localized osteoarthritis of left knee    Status  post reverse arthroplasty of shoulder 01/21/2021   Tremor of both hands 05/21/2020   Vitamin D deficiency 09/21/2009     Past Surgical History:  Procedure Laterality Date   ANAL RECTAL MANOMETRY N/A 11/16/2019   Procedure: ANO RECTAL MANOMETRY;  Surgeon: Napoleon Form, MD;  Location: [facility];  Service: Endoscopy;  Laterality: N/A;   BUNIONECTOMY  2004   left   CATARACT EXTRACTION W/ INTRAOCULAR LENS  IMPLANT, BILATERAL     COLONOSCOPY     EPIDURAL CATHETER INSERTION     L7   FOOT OSTEOTOMY  2003   right   HERNIA REPAIR  1957   JOINT REPLACEMENT     KNEE ARTHROSCOPY  1987&2003   left   MELANOMA EXCISION Right    shoulder   POLYPECTOMY     REVERSE SHOULDER ARTHROPLASTY Right 01/21/2021   Procedure: REVERSE SHOULDER ARTHROPLASTY;  Surgeon: Bjorn Pippin, MD;  Location: [facility];  Service: Orthopedics;  Laterality: Right;   SHOULDER ARTHROSCOPY WITH OPEN ROTATOR CUFF REPAIR  2013   right   SKIN SURGERY  01/2004   Melanoma surg left shoulder   TOENAIL EXCISION  10/04/2017   TOTAL KNEE ARTHROPLASTY Left 09/01/2015   Procedure: TOTAL KNEE ARTHROPLASTY;  Surgeon: Salvatore Marvel, MD;  Location: [facility] OR;  Service: Orthopedics;  Laterality: Left;   TUBAL LIGATION  1990     PREVIOUS MEDICATIONS:   CURRENT MEDICATIONS:  Outpatient Encounter Medications as of 08/16/2023  Medication Sig   aspirin 325 MG tablet Take 325 mg by mouth daily.   B Complex-C (SUPER B COMPLEX PO) Take 1 tablet by mouth daily.    brimonidine (ALPHAGAN) 0.2 % ophthalmic solution 1 drop 3 (three) times daily.   cetirizine (ZYRTEC) 10 MG tablet Take 10 mg by mouth daily.   Cholecalciferol (VITAMIN D3) 5000 UNITS CAPS Take 5,000 Units by mouth daily.    Coenzyme Q10 (COQ10) 200 MG CAPS Take 1 capsule by mouth daily.    doxycycline (ADOXA) 50 MG tablet Take 50 mg by mouth daily. For rosacea   losartan (COZAAR) 50 MG tablet TAKE 1 TABLET EVERY DAY   Magnesium 200 MG TABS Take 200 mg by  mouth in the morning and at bedtime.    Multiple Vitamins-Minerals (MULTIVITAMIN PO) Take 1 tablet by mouth daily.   NP THYROID 60 MG tablet TAKE 1 TABLET(60 MG) BY MOUTH DAILY BEFORE BREAKFAST   omeprazole (PRILOSEC) 20 MG capsule TAKE 1 CAPSULE EVERY DAY   simvastatin (ZOCOR) 20 MG tablet TAKE 1 TABLET AT BEDTIME   SYSTANE BALANCE 0.6 % SOLN SMARTSIG:1 Drop(s) In Eye(s) PRN   Travoprost, BAK Free, (TRAVATAN) 0.004 % SOLN ophthalmic solution Place 1 drop into both eyes at bedtime.    Facility-Administered Encounter Medications as of 08/16/2023  Medication   0.9 %  sodium chloride infusion     Objective:     PHYSICAL EXAMINATION:    VITALS:   Vitals:   08/16/23 0932  BP: (!) 151/86  Pulse: 82  Resp: 20  SpO2: 97%  Height: 5\' 5"  (1.651 m)    GEN:  The patient appears stated age and is in NAD. HEENT:  Normocephalic, atraumatic.   Neurological examination:  General: NAD, well-groomed, appears stated age. Orientation: The patient is alert. Oriented to person, place and date Cranial nerves: There is good facial symmetry.The speech is fluent and clear. No aphasia or dysarthria. Fund of knowledge is appropriate. Recent memory and  remote memory is normal.  Attention and concentration are normal.  Able to name objects and repeat phrases.  Hearing is intact to conversational tone.   Delayed recall 3/3 Sensation: Sensation is intact to light touch throughout Motor: Strength is at least antigravity x4. DTR's 2/4 in UE/LE       No data to display             08/16/2023   11:00 AM 03/01/2023    9:00 AM 08/23/2022    5:00 PM  MMSE - Mini Mental State Exam  Orientation to time 5 4 5   Orientation to Place 5 5 5   Registration 3 3 3   Attention/ Calculation 5 5 5   Recall 3 3 3   Language- name 2 objects 2 2 2   Language- repeat 1 1 1   Language- follow 3 step command 3 3 3   Language- read & follow direction 1 1 1   Write a sentence 1 1 1   Copy design 1 1 1   Total score 30 29  30        Movement examination: Tone: There is normal tone in the UE/LE, no cogwheeling Abnormal movements:minimal intention tremor R>L UE.  No myoclonus.  No asterixis.   Coordination:  There is no decremation with RAM's. Normal finger to nose  Gait and Station: The patient has no difficulty arising out of a deep-seated chair without the use of the hands. The patient's stride length is good.  Gait is cautious and narrow.   Thank you for allowing [place] the opportunity to participate in the care of this nice patient. Please do not hesitate to contact us for any questions or concerns.   Total time spent on today's visit was 33 minutes dedicated to this patient today, preparing to see patient, examining the patient, ordering tests and/or medications and counseling the patient, documenting clinical information in the EHR or other health record, independently interpreting results and communicating results to the patient/family, discussing treatment and goals, answering patient's questions and coordinating care.  Cc:  Sheliah Hatch, MD  Marlowe Kays 08/16/2023 11:21 AM

## 2023-09-06 ENCOUNTER — Encounter: Payer: Self-pay | Admitting: Physician Assistant

## 2023-10-22 ENCOUNTER — Other Ambulatory Visit: Payer: Self-pay | Admitting: Family Medicine

## 2023-11-01 DIAGNOSIS — H401132 Primary open-angle glaucoma, bilateral, moderate stage: Secondary | ICD-10-CM | POA: Diagnosis not present

## 2023-11-01 DIAGNOSIS — H35433 Paving stone degeneration of retina, bilateral: Secondary | ICD-10-CM | POA: Diagnosis not present

## 2023-11-01 DIAGNOSIS — H43813 Vitreous degeneration, bilateral: Secondary | ICD-10-CM | POA: Diagnosis not present

## 2023-11-01 DIAGNOSIS — H18413 Arcus senilis, bilateral: Secondary | ICD-10-CM | POA: Diagnosis not present

## 2023-11-02 DIAGNOSIS — L718 Other rosacea: Secondary | ICD-10-CM | POA: Diagnosis not present

## 2023-11-02 DIAGNOSIS — Z129 Encounter for screening for malignant neoplasm, site unspecified: Secondary | ICD-10-CM | POA: Diagnosis not present

## 2023-11-02 DIAGNOSIS — Z86008 Personal history of in-situ neoplasm of other site: Secondary | ICD-10-CM | POA: Diagnosis not present

## 2023-11-02 DIAGNOSIS — L578 Other skin changes due to chronic exposure to nonionizing radiation: Secondary | ICD-10-CM | POA: Diagnosis not present

## 2023-11-02 DIAGNOSIS — L820 Inflamed seborrheic keratosis: Secondary | ICD-10-CM | POA: Diagnosis not present

## 2023-11-02 DIAGNOSIS — D225 Melanocytic nevi of trunk: Secondary | ICD-10-CM | POA: Diagnosis not present

## 2023-11-02 DIAGNOSIS — Z85820 Personal history of malignant melanoma of skin: Secondary | ICD-10-CM | POA: Diagnosis not present

## 2023-11-02 DIAGNOSIS — L570 Actinic keratosis: Secondary | ICD-10-CM | POA: Diagnosis not present

## 2023-11-11 ENCOUNTER — Telehealth: Payer: Self-pay | Admitting: Family Medicine

## 2023-11-11 NOTE — Telephone Encounter (Addendum)
 Type of form received:[employer]   Additional comments:   Received ab:[id number] - Front Desk   Form should be Faxed/mailed to: N/A  Is patient requesting call for pickup:N/A  Form placed: [employer] charge sheet.  Provider will determine charge.N/A  Individual made aware of 3-5 business day turn around No?  FYI Only

## 2023-11-17 ENCOUNTER — Encounter: Payer: Self-pay | Admitting: Psychology

## 2023-11-26 NOTE — Progress Notes (Signed)
 This encounter was created in error - please disregard.

## 2023-12-02 ENCOUNTER — Encounter: Payer: Self-pay | Admitting: Family Medicine

## 2023-12-02 ENCOUNTER — Ambulatory Visit (INDEPENDENT_AMBULATORY_CARE_PROVIDER_SITE_OTHER): Payer: Medicare HMO | Admitting: Family Medicine

## 2023-12-02 VITALS — BP 128/68 | HR 71 | Temp 98.0°F | Ht 65.0 in | Wt 200.0 lb

## 2023-12-02 DIAGNOSIS — E559 Vitamin D deficiency, unspecified: Secondary | ICD-10-CM | POA: Diagnosis not present

## 2023-12-02 DIAGNOSIS — I10 Essential (primary) hypertension: Secondary | ICD-10-CM

## 2023-12-02 DIAGNOSIS — Z0000 Encounter for general adult medical examination without abnormal findings: Secondary | ICD-10-CM | POA: Diagnosis not present

## 2023-12-02 LAB — BASIC METABOLIC PANEL
BUN: 15 mg/dL (ref 6–23)
CO2: 29 meq/L (ref 19–32)
Calcium: 9.6 mg/dL (ref 8.4–10.5)
Chloride: 104 meq/L (ref 96–112)
Creatinine, Ser: 0.72 mg/dL (ref 0.40–1.20)
GFR: 83.77 mL/min (ref >60.00)
Glucose, Bld: 106 mg/dL — ABNORMAL HIGH (ref 70–99)
Potassium: 4.9 meq/L (ref 3.5–5.1)
Sodium: 141 meq/L (ref 135–145)

## 2023-12-02 LAB — LIPID PANEL
Cholesterol: 178 mg/dL (ref 0–200)
HDL: 75.1 mg/dL (ref >39.00)
LDL Cholesterol: 84 mg/dL (ref 0–99)
NonHDL: 102.92
Total CHOL/HDL Ratio: 2
Triglycerides: 96 mg/dL (ref 0.0–149.0)
VLDL: 19.2 mg/dL (ref 0.0–40.0)

## 2023-12-02 LAB — CBC WITH DIFFERENTIAL/PLATELET
Basophils Absolute: 0 10*3/uL (ref 0.0–0.1)
Basophils Relative: 0.7 % (ref 0.0–3.0)
Eosinophils Absolute: 0.1 10*3/uL (ref 0.0–0.7)
Eosinophils Relative: 2 % (ref 0.0–5.0)
HCT: 37.9 % (ref 36.0–46.0)
Hemoglobin: 12.2 g/dL (ref 12.0–15.0)
Lymphocytes Relative: 30.7 % (ref 12.0–46.0)
Lymphs Abs: 1.7 10*3/uL (ref 0.7–4.0)
MCHC: 32.3 g/dL (ref 30.0–36.0)
MCV: 81.1 fL (ref 78.0–100.0)
Monocytes Absolute: 0.4 10*3/uL (ref 0.1–1.0)
Monocytes Relative: 7.4 % (ref 3.0–12.0)
Neutro Abs: 3.3 10*3/uL (ref 1.4–7.7)
Neutrophils Relative %: 59.2 % (ref 43.0–77.0)
Platelets: 295 10*3/uL (ref 150.0–400.0)
RBC: 4.67 Mil/uL (ref 3.87–5.11)
RDW: 14.6 % (ref 11.5–15.5)
WBC: 5.5 10*3/uL (ref 4.0–10.5)

## 2023-12-02 LAB — HEPATIC FUNCTION PANEL
ALT: 23 U/L (ref 0–35)
AST: 26 U/L (ref 0–37)
Albumin: 4.4 g/dL (ref 3.5–5.2)
Alkaline Phosphatase: 93 U/L (ref 39–117)
Bilirubin, Direct: 0.1 mg/dL (ref 0.0–0.3)
Total Bilirubin: 0.6 mg/dL (ref 0.2–1.2)
Total Protein: 7.3 g/dL (ref 6.0–8.3)

## 2023-12-02 LAB — VITAMIN D 25 HYDROXY (VIT D DEFICIENCY, FRACTURES): VITD: 58.74 ng/mL (ref 30.00–100.00)

## 2023-12-02 LAB — TSH: TSH: 1.71 u[IU]/mL (ref 0.35–5.50)

## 2023-12-02 NOTE — Assessment & Plan Note (Signed)
 Chronic problem.  Currently well controlled and asymptomatic.  Will follow

## 2023-12-02 NOTE — Assessment & Plan Note (Signed)
 Pt's PE WNL w/ exception of BMI.  UTD on colonoscopy, mammo, immunizations.  Check labs.  Anticipatory guidance provided.

## 2023-12-02 NOTE — Patient Instructions (Signed)
 Follow up in 6 months to recheck BP and cholesterol We'll notify you of your lab results and make any changes if needed Continue to work on healthy diet and regular exercise- you look great! Call with any questions or concerns Stay Safe!  Stay Healthy! Happy [birth year] Birthday!!!

## 2023-12-02 NOTE — Progress Notes (Signed)
 Subjective:    Patient ID: [name] [name], female    DOB: Nov 08, 1950, 73 y.o.   MRN: [id number]  HPI CPE- UTD on colonoscopy, mammo, immunizations  Patient Care Team    Relationship Specialty Notifications Start End  Sheliah Hatch, MD PCP - General Family Medicine  11/22/14   Thomasene Ripple, DO PCP - Cardiology Cardiology  04/09/21   Ola Spurr., MD Consulting Physician Obstetrics and Gynecology  05/29/15   Hollar, Ronal Fear, MD Referring Physician Dermatology  05/29/15   Salvatore Marvel, MD Consulting Physician Orthopedic Surgery  05/29/15   Lenn Sink, [place] Consulting Physician Podiatry  05/29/15   Herma Mering, MD Referring Physician Dermatology  11/09/17   Mia Creek, MD Consulting Physician Ophthalmology  11/15/18   Erroll Luna, [facility] (Inactive) Pharmacist Pharmacist  11/30/21    Comment: [phone]  Erroll Luna, [facility] (Inactive)  Pharmacist  07/01/22    Comment: [phone]     Health Maintenance  Topic Date Due   MAMMOGRAM  01/07/2023   COVID-19 Vaccine (6 - 2024-25 season) 06/05/2023   Medicare Annual Wellness (AWV)  12/16/2023   Colonoscopy  03/30/2029   DTaP/Tdap/Td (3 - Td or Tdap) 05/21/2031   Pneumonia Vaccine 27+ Years old  Completed   INFLUENZA VACCINE  Completed   DEXA SCAN  Completed   Hepatitis C Screening  Completed   Zoster Vaccines- Shingrix  Completed   HPV VACCINES  Aged Out      Review of Systems Patient reports no vision/ hearing changes, adenopathy,fever, persistant/recurrent hoarseness , swallowing issues, chest pain, palpitations, edema, persistant/recurrent cough, hemoptysis, dyspnea (rest/exertional/paroxysmal nocturnal), gastrointestinal bleeding (melena, rectal bleeding), abdominal pain, significant heartburn, bowel changes, GU symptoms (dysuria, hematuria, incontinence), Gyn symptoms (abnormal  bleeding, pain),  syncope, focal weakness, memory loss, numbness & tingling, skin/hair/nail changes, abnormal bruising or  bleeding, anxiety, or depression.   + 5 lb weight gain    Objective:   Physical Exam General Appearance:    Alert, cooperative, no distress, appears stated age, obese  Head:    Normocephalic, without obvious abnormality, atraumatic  Eyes:    PERRL, conjunctiva/corneas clear, EOM's intact both eyes  Ears:    Normal TM's and external ear canals, both ears  Nose:   Nares normal, septum midline, mucosa normal, no drainage    or sinus tenderness  Throat:   Lips, mucosa, and tongue normal; teeth and gums normal  Neck:   Supple, symmetrical, trachea midline, no adenopathy;    Thyroid: no enlargement/tenderness/nodules  Back:     Symmetric, no curvature, ROM normal, no CVA tenderness  Lungs:     Clear to auscultation bilaterally, respirations unlabored  Chest Wall:    No tenderness or deformity   Heart:    Regular rate and rhythm, S1 and S2 normal, no murmur, rub   or gallop  Breast Exam:    Deferred to mammo  Abdomen:     Soft, non-tender, bowel sounds active all four quadrants,    no masses, no organomegaly  Genitalia:    Deferred  Rectal:    Extremities:   Extremities normal, atraumatic, no cyanosis or edema  Pulses:   2+ and symmetric all extremities  Skin:   Skin color, texture, turgor normal, no rashes or lesions  Lymph nodes:   Cervical, supraclavicular, and axillary nodes normal  Neurologic:   CNII-XII intact, normal strength, sensation and reflexes    throughout          Assessment & Plan:

## 2023-12-05 ENCOUNTER — Telehealth: Payer: Self-pay

## 2023-12-05 ENCOUNTER — Encounter: Payer: Self-pay | Admitting: Family Medicine

## 2023-12-05 NOTE — Telephone Encounter (Signed)
-----   Message from Neena Rhymes sent at 12/05/2023  7:46 AM EST ----- Labs are stable and look good.  No changes at this time

## 2023-12-05 NOTE — Telephone Encounter (Signed)
 Pt has been notified.

## 2023-12-07 ENCOUNTER — Encounter: Payer: Self-pay | Admitting: Family Medicine

## 2024-01-10 ENCOUNTER — Ambulatory Visit (INDEPENDENT_AMBULATORY_CARE_PROVIDER_SITE_OTHER): Admitting: *Deleted

## 2024-01-10 DIAGNOSIS — Z780 Asymptomatic menopausal state: Secondary | ICD-10-CM

## 2024-01-10 DIAGNOSIS — Z0000 Encounter for general adult medical examination without abnormal findings: Secondary | ICD-10-CM | POA: Diagnosis not present

## 2024-01-10 DIAGNOSIS — Z1231 Encounter for screening mammogram for malignant neoplasm of breast: Secondary | ICD-10-CM | POA: Diagnosis not present

## 2024-01-10 NOTE — Progress Notes (Signed)
 Subjective:   [name] [name] is a 73 y.o. female who presents for Medicare Annual (Subsequent) preventive examination.  Visit Complete: Virtual I connected with  Sheralyn Boatman on 01/10/24 by a audio enabled telemedicine application and verified that I am speaking with the correct person using two identifiers.  Patient Location: Home  Provider Location: Home Office  I discussed the limitations of evaluation and management by telemedicine. The patient expressed understanding and agreed to proceed.  Vital Signs: Because this visit was a virtual/telehealth visit, some criteria may be missing or patient reported. Any vitals not documented were not able to be obtained and vitals that have been documented are patient reported.  Patient Medicare AWV questionnaire was completed by the patient on 01-07-2024; I have confirmed that all information answered by patient is correct and no changes since this date.  Cardiac Risk Factors include: advanced age (>39men, >72 women);family history of premature cardiovascular disease     Objective:    There were no vitals filed for this visit. There is no height or weight on file to calculate BMI.     01/10/2024   10:11 AM 08/16/2023    9:38 AM 03/01/2023    8:58 AM 08/23/2022   11:00 AM 12/10/2021    1:17 PM 01/21/2021    9:01 AM 01/12/2021    5:02 PM  Advanced Directives  Does Patient Have a Medical Advance Directive? Yes Yes Yes Yes Yes Yes Yes  Type of Social research officer, government Power of [employer] Power of [place];Living will  Living will;Healthcare Power of Attorney  Does patient want to make changes to medical advance directive?  No - Patient declined No - Patient declined   No - Patient declined No - Patient declined  Copy of Healthcare Power of Attorney in Chart? No - copy requested No - copy requested No - copy requested  No - copy requested      Current Medications  (verified) Outpatient Encounter Medications as of 01/10/2024  Medication Sig   aspirin 325 MG tablet Take 325 mg by mouth daily.   B Complex-C (SUPER B COMPLEX PO) Take 1 tablet by mouth daily.    brimonidine (ALPHAGAN) 0.2 % ophthalmic solution 1 drop 3 (three) times daily.   cetirizine (ZYRTEC) 10 MG tablet Take 10 mg by mouth daily.   Cholecalciferol (VITAMIN D3) 5000 UNITS CAPS Take 5,000 Units by mouth daily.    Coenzyme Q10 (COQ10) 200 MG CAPS Take 1 capsule by mouth daily.    COMIRNATY syringe    doxycycline (ADOXA) 50 MG tablet Take 50 mg by mouth daily. For rosacea   losartan (COZAAR) 50 MG tablet TAKE 1 TABLET EVERY DAY   Magnesium 200 MG TABS Take 200 mg by mouth in the morning and at bedtime.    Multiple Vitamins-Minerals (MULTIVITAMIN PO) Take 1 tablet by mouth daily.   NP THYROID 60 MG tablet TAKE 1 TABLET(60 MG) BY MOUTH DAILY BEFORE BREAKFAST   omeprazole (PRILOSEC) 20 MG capsule TAKE 1 CAPSULE EVERY DAY   simvastatin (ZOCOR) 20 MG tablet TAKE 1 TABLET AT BEDTIME   SYSTANE BALANCE 0.6 % SOLN SMARTSIG:1 Drop(s) In Eye(s) PRN   Travoprost, BAK Free, (TRAVATAN) 0.004 % SOLN ophthalmic solution Place 1 drop into both eyes at bedtime.    tretinoin (RETIN-A) 0.05 % cream Apply topically at bedtime.   Facility-Administered Encounter Medications as of 01/10/2024  Medication   0.9 %  sodium chloride infusion  Allergies (verified) Clindamycin/lincomycin, Flagyl [metronidazole hcl], Augmentin [amoxicillin-pot clavulanate], and Telithromycin   History: Past Medical History:  Diagnosis Date   Allergy 12/19/2007   Bronchitis    Cataract    Cognitive dysfunction 07/14/2022   Coronary artery calcification seen on CAT scan 04/09/2021   DJD (degenerative joint disease)    DOE (dyspnea on exertion) 04/09/2021   GERD (gastroesophageal reflux disease) 12/19/2007   Glaucoma    History of adenomatous colonic polyps 01/17/2017   Hypercholesterolemia    Hypothyroidism    IBS  (irritable bowel syndrome)    Malignant melanoma of neck    Mixed hyperlipidemia 04/09/2021   OSA (obstructive sleep apnea) 12/27/2017   no CPAP   Osteoporosis    Plantar fasciitis    Postoperative nausea and vomiting 09/02/2015   Precordial pain 04/09/2021   Presence of shoulder implant 01/21/2021   Primary hypertension 12/19/2007   Primary localized osteoarthritis of left knee    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/21/2021   Tremor of both hands 05/21/2020   Vitamin D deficiency 09/21/2009   Past Surgical History:  Procedure Laterality Date   ANAL RECTAL MANOMETRY N/A 11/16/2019   Procedure: ANO RECTAL MANOMETRY;  Surgeon: Napoleon Form, MD;  Location: [facility];  Service: Endoscopy;  Laterality: N/A;   BUNIONECTOMY  2004   left   CATARACT EXTRACTION W/ INTRAOCULAR LENS  IMPLANT, BILATERAL     COLONOSCOPY     EPIDURAL CATHETER INSERTION     L7   FOOT OSTEOTOMY  2003   right   HERNIA REPAIR  1957   JOINT REPLACEMENT     KNEE ARTHROSCOPY  1987&2003   left   MELANOMA EXCISION Right    shoulder   POLYPECTOMY     REVERSE SHOULDER ARTHROPLASTY Right 01/21/2021   Procedure: REVERSE SHOULDER ARTHROPLASTY;  Surgeon: Bjorn Pippin, MD;  Location: [facility];  Service: Orthopedics;  Laterality: Right;   SHOULDER ARTHROSCOPY WITH OPEN ROTATOR CUFF REPAIR  2013   right   SKIN SURGERY  01/2004   Melanoma surg left shoulder   TOENAIL EXCISION  10/04/2017   TOTAL KNEE ARTHROPLASTY Left 09/01/2015   Procedure: TOTAL KNEE ARTHROPLASTY;  Surgeon: Salvatore Marvel, MD;  Location[facility];  Service: Orthopedics;  Laterality: Left;   TUBAL LIGATION  1990   Family History  Problem Relation Age of Onset   Tremor Mother        no PD   Esophageal cancer Father    Colon polyps Father    Cancer Father        laryngeal   Heart disease Father    Osteoporosis Sister    Hypertension Brother    Heart disease Maternal Grandfather    Heart disease Paternal  Grandfather    Diabetes Son    Tremor Maternal Aunt    Alzheimer's disease Maternal Aunt    Colon cancer Neg Hx    Stomach cancer Neg Hx    Crohn's disease Neg Hx    Rectal cancer Neg Hx    Social History   Socioeconomic History   Marital status: Married    Spouse name: Tammy Sours [phone]   Number of children: 2   Years of education: 94   Highest education level: 12th grade  Occupational History   Occupation: retired  Tobacco Use   Smoking status: Former    Current packs/day: 0.00    Types: Cigarettes    Start date: 10/04/1969    Quit date: 10/05/1975  Years since quitting: 48.2    Passive exposure: Past   Smokeless tobacco: Never  Vaping Use   Vaping status: Never Used  Substance and Sexual Activity   Alcohol use: Yes    Alcohol/week: 7.0 standard drinks of alcohol    Types: 7 Standard drinks or equivalent per week    Comment: 1 beverage nightly   Drug use: No   Sexual activity: Yes  Other Topics Concern   Not on file  Social History Narrative   Right handed   Drinks caffeine, tea   Retired   Lives with her husband   Social Drivers of Corporate investment banker Strain: Low Risk  (01/10/2024)   Overall Financial Resource Strain (CARDIA)    Difficulty of Paying Living Expenses: Not hard at all  Food Insecurity: No Food Insecurity (01/10/2024)   Hunger Vital Sign    Worried About Running Out of Food in the Last Year: Never true    Ran Out of Food in the Last Year: Never true  Transportation Needs: No Transportation Needs (01/10/2024)   PRAPARE - Administrator, Civil Service (Medical): No    Lack of Transportation (Non-Medical): No  Physical Activity: Insufficiently Active (01/10/2024)   Exercise Vital Sign    Days of Exercise per Week: 3 days    Minutes of Exercise per Session: 20 min  Stress: No Stress Concern Present (01/10/2024)   [employer] of Occupational Health - Occupational Stress Questionnaire    Feeling of Stress : Not at all  Social  Connections: Socially Integrated (01/10/2024)   Social Connection and Isolation Panel [NHANES]    Frequency of Communication with Friends and Family: More than three times a week    Frequency of Social Gatherings with Friends and Family: Three times a week    Attends Religious Services: 1 to 4 times per year    Active Member of Clubs or Organizations: Yes    Attends Engineer, structural: More than 4 times per year    Marital Status: Married    Tobacco Counseling Counseling given: Not Answered   Clinical Intake:  Pre-visit preparation completed: Yes  Pain : No/denies pain     Diabetes: No  How often do you need to have someone help you when you read instructions, pamphlets, or other written materials from your doctor or pharmacy?: 1 - Never  Interpreter Needed?: No  Information entered by :: Remi Haggard LPN   Activities of Daily Living    01/10/2024   10:13 AM 01/07/2024    3:55 PM  In your present state of health, do you have any difficulty performing the following activities:  Hearing? 0 0  Vision? 0 0  Difficulty concentrating or making decisions? 0 0  Walking or climbing stairs? 0 0  Dressing or bathing? 0 0  Doing errands, shopping? 0 0  Preparing Food and eating ? N N  Using the Toilet? N N  In the past six months, have you accidently leaked urine? N N  Do you have problems with loss of bowel control? Y Y  Managing your Medications? N N  Managing your Finances? N N  Housekeeping or managing your Housekeeping? N N    Patient Care Team: Sheliah Hatch, MD as PCP - General (Family Medicine) Thomasene Ripple, DO as PCP - Cardiology (Cardiology) Ola Spurr., MD as Consulting Physician (Obstetrics and Gynecology) [facility], Ronal Fear, MD as Referring Physician (Dermatology) Salvatore Marvel, MD as Consulting Physician (Orthopedic Surgery)  Lenn Sink, DPM as Consulting Physician (Podiatry) Herma Mering, MD as Referring Physician  (Dermatology) Mia Creek, MD as Consulting Physician (Ophthalmology) Erroll Luna, [facility] (Inactive) as Pharmacist (Pharmacist) Erroll Luna, [facility] (Inactive) (Pharmacist)  Indicate any recent Medical Services you may have received from other than [facility] providers in the past year (date may be approximate).     Assessment:   This is a routine wellness examination for Akirah.  Hearing/Vision screen Hearing Screening - Comments:: No trouble hearing Vision Screening - Comments:: Up to date [place]   Goals Addressed             This Visit's Progress    Weight (lb) < 200 lb (90.7 kg)         Depression Screen    01/10/2024   10:14 AM 12/02/2023    9:18 AM 06/01/2023    9:00 AM 03/01/2023   10:37 AM 12/16/2022    1:40 PM 11/24/2022   10:45 AM 08/06/2022   10:48 AM  PHQ 2/9 Scores  PHQ - 2 Score 0 2 0 0 0 0 0  PHQ- 9 Score 4 6 1  0 0 1 0    Fall Risk    01/10/2024   10:13 AM 01/07/2024    3:55 PM 12/02/2023    9:18 AM 08/16/2023    9:38 AM 06/01/2023    9:01 AM  Fall Risk   Falls in the past year? 0 0 0 0 0  Number falls in past yr: 0 0 0 0 0  Injury with Fall? 0 0 0 0 0  Risk for fall due to :     No Fall Risks  Follow up Falls evaluation completed;Education provided;Falls prevention discussed   Falls evaluation completed Falls evaluation completed    MEDICARE RISK AT HOME: Medicare Risk at Home Any stairs in or around the home?: Yes If so, are there any without handrails?: No Home free of loose throw rugs in walkways, pet beds, electrical cords, etc?: No Adequate lighting in your home to reduce risk of falls?: Yes Life alert?: No Use of a cane, walker or w/c?: No Grab bars in the bathroom?: No Shower chair or bench in shower?: No Elevated toilet seat or a handicapped toilet?: No  TIMED UP AND GO:  Was the test performed?  No    Cognitive Function:    08/16/2023   11:00 AM 03/01/2023    9:00 AM 08/23/2022    5:00 PM 11/15/2018   10:05 AM  11/09/2017    9:35 AM  MMSE - Mini Mental State Exam  Orientation to time 5 4 5 5 5   Orientation to Place 5 5 5 5 5   Registration 3 3 3 3 3   Attention/ Calculation 5 5 5 5 5   Recall 3 3 3 3 3   Language- name 2 objects 2 2 2 2 2   Language- repeat 1 1 1 1 1   Language- follow 3 step command 3 3 3 3 3   Language- read & follow direction 1 1 1 1 1   Write a sentence 1 1 1 1 1   Copy design 1 1 1 1 1   Total score 30 29 30 30 30         01/10/2024   10:12 AM 12/16/2022    1:41 PM  6CIT Screen  What Year? 0 points 0 points  What month? 0 points 0 points  What time? 0 points 0 points  Count back from 20 0 points 0 points  Months in reverse 0 points 0 points  Repeat phrase 0 points 4 points  Total Score 0 points 4 points    Immunizations Immunization History  Administered Date(s) Administered   Fluad Quad(high Dose 65+) 06/04/2019, 07/02/2021, 07/16/2021, 06/17/2022   Fluad Trivalent(High Dose 65+) 06/01/2023   Influenza Split 08/04/2012   Influenza, High Dose Seasonal PF 06/12/2018, 06/03/2020   Influenza, Seasonal, Injecte, Preservative Fre 06/04/2019   Influenza,inj,Quad PF,6+ Mos 11/22/2014, 07/01/2015, 06/04/2016, 07/25/2017   Influenza-Unspecified 07/03/2021   PFIZER(Purple Top)SARS-COV-2 Vaccination 10/26/2019, 11/13/2019, 06/19/2020, 02/11/2021   PNEUMOCOCCAL CONJUGATE-20 05/20/2021   Pfizer Covid-19 Vaccine Bivalent Booster 47yrs & up 07/16/2021   Pneumococcal Conjugate-13 05/17/2018   Tdap 10/01/2011, 05/20/2021   Zoster Recombinant(Shingrix) 07/25/2017, 12/22/2017   Zoster, Live 12/03/2015    TDAP status: Up to date  Flu Vaccine status: Up to date  Pneumococcal vaccine status: Up to date  Covid-19 vaccine status: Information provided on how to obtain vaccines.   Qualifies for Shingles Vaccine? No   Zostavax completed Yes   Shingrix Completed?: Yes  Screening Tests Health Maintenance  Topic Date Due   MAMMOGRAM  01/07/2023   COVID-19 Vaccine (6 - 2024-25  season) 06/05/2023   INFLUENZA VACCINE  05/04/2024   Medicare Annual Wellness (AWV)  01/09/2025   Colonoscopy  03/30/2029   DTaP/Tdap/Td (3 - Td or Tdap) 05/21/2031   Pneumonia Vaccine 4+ Years old  Completed   DEXA SCAN  Completed   Hepatitis C Screening  Completed   Zoster Vaccines- Shingrix  Completed   HPV VACCINES  Aged Out    Health Maintenance  Health Maintenance Due  Topic Date Due   MAMMOGRAM  01/07/2023   COVID-19 Vaccine (6 - 2024-25 season) 06/05/2023    Colorectal cancer screening: Type of screening: Colonoscopy. Completed 2023. Repeat every 7 years  Mammogram status: Ordered  . Pt provided with contact info and advised to call to schedule appt.   Bone Density status: Ordered  . Pt provided with contact info and advised to call to schedule appt.  Lung Cancer Screening: (Low Dose CT Chest recommended if Age 70-80 years, 20 pack-year currently smoking OR have quit w/in 15years.) does not qualify.   Lung Cancer Screening Referral:   Additional Screening:  Hepatitis C Screening: does not qualify; Completed 2018  Vision Screening: Recommended annual ophthalmology exams for early detection of glaucoma and other disorders of the eye. Is the patient up to date with their annual eye exam?  Yes  Who is the provider or what is the name of the office in which the patient attends annual eye exams? [facility] If pt is not established with a provider, would they like to be referred to a provider to establish care? No .   Dental Screening: Recommended annual dental exams for proper oral hygiene    Community Resource Referral / Chronic Care Management: CRR required this visit?  No   CCM required this visit?  No     Plan:     I have personally reviewed and noted the following in the patient's chart:   Medical and social history Use of alcohol, tobacco or illicit drugs  Current medications and supplements including opioid prescriptions. Patient is not currently taking  opioid prescriptions. Functional ability and status Nutritional status Physical activity Advanced directives List of other physicians Hospitalizations, surgeries, and ER visits in previous 12 months Vitals Screenings to include cognitive, depression, and falls Referrals and appointments  In addition, I have reviewed and discussed with patient certain preventive protocols,  quality metrics, and best practice recommendations. A written personalized care plan for preventive services as well as general preventive health recommendations were provided to patient.     Remi Haggard, LPN   10/09/1094   After Visit Summary: (MyChart) Due to this being a telephonic visit, the after visit summary with patients personalized plan was offered to patient via MyChart   Nurse Notes:

## 2024-01-30 ENCOUNTER — Ambulatory Visit (HOSPITAL_BASED_OUTPATIENT_CLINIC_OR_DEPARTMENT_OTHER)
Admission: RE | Admit: 2024-01-30 | Discharge: 2024-01-30 | Disposition: A | Discharge status: Home / Self Care | Source: Ambulatory Visit | Attending: Student in an Organized Health Care Education/Training Program | Admitting: Student in an Organized Health Care Education/Training Program

## 2024-01-30 ENCOUNTER — Encounter: Payer: Self-pay | Admitting: Student in an Organized Health Care Education/Training Program

## 2024-01-30 ENCOUNTER — Ambulatory Visit (INDEPENDENT_AMBULATORY_CARE_PROVIDER_SITE_OTHER): Admitting: Student in an Organized Health Care Education/Training Program

## 2024-01-30 ENCOUNTER — Ambulatory Visit: Payer: Self-pay

## 2024-01-30 VITALS — BP 120/62 | HR 83 | Temp 98.0°F | Wt 203.0 lb

## 2024-01-30 DIAGNOSIS — R0609 Other forms of dyspnea: Secondary | ICD-10-CM | POA: Insufficient documentation

## 2024-01-30 DIAGNOSIS — R0602 Shortness of breath: Secondary | ICD-10-CM | POA: Diagnosis not present

## 2024-01-30 DIAGNOSIS — E611 Iron deficiency: Secondary | ICD-10-CM | POA: Diagnosis not present

## 2024-01-30 DIAGNOSIS — R072 Precordial pain: Secondary | ICD-10-CM | POA: Diagnosis not present

## 2024-01-30 NOTE — Assessment & Plan Note (Signed)
 Sensation of chest discomfort on exertion yesterday.  No chest pain today at rest.  EKG is reassuring.  She has a history of similar chest discomfort in 2018 and 2022.  She does have arterial calcifications seen on CTs of her chest.  She had a normal stress evaluations in 2018 and 2022.  Based on her symptoms, and her arterial disease, I cannot rule out that this discomfort is cardiac angina.  She has a strong family history for ischemic heart disease.  I will get another echocardiogram of her heart to look for changes in wall motion, it has been 7 years since her last echocardiogram.  She is on good medications for managing risk factors of ischemic heart disease with simvastatin , losartan , even uses a daily aspirin.  She does not think the discomfort felt like GERD, and she takes omeprazole  consistently.  No signs of lung disease on her exam, though I am going to get a chest x-ray.  Will refer the patient back to Dr. Emmette Harms who consulted with her in 2022 about the similar chest discomfort.

## 2024-01-30 NOTE — Progress Notes (Signed)
 Acute Office Visit  Subjective:     Patient ID: [name] [name], female    DOB: 05-Jan-1951, 73 y.o.   MRN: [id number]  Chief Complaint  Patient presents with   Shortness of Breath    Shortness of breath for 3 weeks not getting better and pinching feeling in the sternum area of her chest that started yesterday.     HPI  Patient is in today for evaluation of chest discomfort and dyspnea on exertion.  Patient was vacationing at [facility] last week to celebrate her 49th wedding anniversary.  While walking up a flight of stairs she developed a sensation of dyspnea, had to rest after only 2 flights of stairs.  She also felt a pinching sensation in the middle of her chest.  After resting for a few minutes the sensation went away and the dyspnea improved.  She reports that dyspnea on exertion has been an increasing issue for her over the last 3 weeks.  Starting to limit her ability to walk as exercise.  She is worried about developing heart disease, says there is a strong family history of ischemic heart events.  Her husband also had similar symptoms to this in the past and ended up having severe coronary artery disease that needed treatment with bypass graft surgery.  She has never had a heart attack or stroke.  No other changes in her health recently.  No cough.  On treatment for sleep apnea.  She has had similar episodes to this in 2018 and 2022.  Evaluations with pulmonology and cardiology clinics at that time unable to find an explanation.      Objective:    BP 120/62   Pulse 83   Temp 98 F (36.7 C) (Temporal)   Wt 203 lb (92.1 kg)   SpO2 97%   BMI 33.78 kg/m    Physical Exam  Gen: Well-appearing woman Heart: Regular with an occasional skipped beat, no murmur Neck: No JVD Lungs: Unlabored, clear to auscultation with no wheezing or crackles Ext: No edema      Assessment & Plan:   Problem List Items Addressed This Visit       Unprioritized   Precordial pain    Sensation of chest discomfort on exertion yesterday.  No chest pain today at rest.  EKG is reassuring.  She has a history of similar chest discomfort in 2018 and 2022.  She does have arterial calcifications seen on CTs of her chest.  She had a normal stress evaluations in 2018 and 2022.  Based on her symptoms, and her arterial disease, I cannot rule out that this discomfort is cardiac angina.  She has a strong family history for ischemic heart disease.  I will get another echocardiogram of her heart to look for changes in wall motion, it has been 7 years since her last echocardiogram.  She is on good medications for managing risk factors of ischemic heart disease with simvastatin , losartan , even uses a daily aspirin.  She does not think the discomfort felt like GERD, and she takes omeprazole  consistently.  No signs of lung disease on her exam, though I am going to get a chest x-ray.  Will refer the patient back to Dr. Emmette Harms who consulted with her in 2022 about the similar chest discomfort.      DOE (dyspnea on exertion) - Primary   Recurrent issue, but becoming function limiting for her.  Currently can only walk up 2 flights of stairs and has to rest.  At times associated with chest discomfort.  She had a normal pulmonary function testing in 2023.  Last CT chest to follow-up pulmonary nodules was September 2024.  Pulmonary exam today is normal.  Will check CBC to rule out anemia.  EKG was reassuring.  She has had normal echocardiogram in 2018.  Normal stress testing of her heart in 2018 and 2022.  She does have diffuse arterial calcifications, so I cannot fully rule out that this dyspnea is cardiac etiology.  Will get chest x-ray today.  I think if that is normal, I doubt that the dyspnea is coming from a pulmonary issue.  Will repeat echocardiogram to rule out pulmonary hypertension.      Relevant Orders   CBC with Differential/Platelet   Basic metabolic panel with GFR   ECHOCARDIOGRAM COMPLETE   DG Chest  2 View   Ambulatory referral to Cardiology    No follow-ups on file.  Ether Hercules, MD

## 2024-01-30 NOTE — Assessment & Plan Note (Addendum)
 Recurrent issue, but becoming function limiting for her.  Currently can only walk up 2 flights of stairs and has to rest.  At times associated with chest discomfort.  She had a normal pulmonary function testing in 2023.  Last CT chest to follow-up pulmonary nodules was September 2024.  Pulmonary exam today is normal.  Will check CBC to rule out anemia.  EKG was reassuring.  She has had normal echocardiogram in 2018.  Normal stress testing of her heart in 2018 and 2022.  She does have diffuse arterial calcifications, so I cannot fully rule out that this dyspnea is cardiac etiology.  Will get chest x-ray today.  I think if that is normal, I doubt that the dyspnea is coming from a pulmonary issue.  Will repeat echocardiogram to rule out pulmonary hypertension.

## 2024-01-30 NOTE — Telephone Encounter (Signed)
 Chief Complaint: SOB Symptoms: intermittent SOB with exertion (not present now), mild chest pinching sensation with exertion lasted less than a minute yesterday (not present now) Frequency: SOB x 3 weeks, chest pain x yesterday Pertinent Negatives: Patient denies wheezing, travel out of country, exposure to illness, cough, fever, pain in shoulders/arms/jaw Disposition: [] ED /[] Urgent Care (no appt availability in office) / [x] Appointment(In office/virtual)/ []  [facility]/ [] Home Care/ [] Refused Recommended Disposition /[] [facility]/ []  Follow-up with PCP Additional Notes: SOB and chest pain not present at this time. Patient states she is driving back into town currently and is not available til later to be seen. Patient states she has had intermittent SOB x 3 weeks and yesterday had a mild chest pinching sensation that is not present at this time either. She states she is currently asymptomatic, scheduled for acute visit today with available provider and gave patient strict instructions to go to ED if chest pain or SOB returns.  Copied from CRM [id number]. Topic: Clinical - Red Word Triage >> Jan 30, 2024 10:33 AM Howard Macho wrote: Red Word that prompted transfer to Nurse Triage: patient called stating she has shortness of breath for three weeks and yesterday the patient felt a pinching sensation between her breast Reason for Disposition  [1] Chest pain lasts < 5 minutes AND [2] NO chest pain or cardiac symptoms (e.g., breathing difficulty, sweating) now  (Exception: Chest pains that last only a few seconds.)  Answer Assessment - Initial Assessment Questions 1. RESPIRATORY STATUS: "Describe your breathing?" (e.g., wheezing, shortness of breath, unable to speak, severe coughing)      Shortness of breath, especially after exerting herself "getting winded".  2. ONSET: "When did this breathing problem begin?"      X 3 weeks.  3. PATTERN "Does the difficult breathing come and  go, or has it been constant since it started?"      Comes and goes.  4. SEVERITY: "How bad is your breathing?" (e.g., mild, moderate, severe)    - MILD: No SOB at rest, mild SOB with walking, speaks normally in sentences, can lie down, no retractions, pulse < 100.    - MODERATE: SOB at rest, SOB with minimal exertion and prefers to sit, cannot lie down flat, speaks in phrases, mild retractions, audible wheezing, pulse 100-120.    - SEVERE: Very SOB at rest, speaks in single words, struggling to breathe, sitting hunched forward, retractions, pulse > 120      Mild to moderate, only when exerting herself, not at rest.  5. RECURRENT SYMPTOM: "Have you had difficulty breathing before?" If Yes, ask: "When was the last time?" and "What happened that time?"      Denies.  6. CARDIAC HISTORY: "Do you have any history of heart disease?" (e.g., heart attack, angina, bypass surgery, angioplasty)      Carotid and coronary artery calcification.  7. LUNG HISTORY: "Do you have any history of lung disease?"  (e.g., pulmonary embolus, asthma, emphysema)     Sleep apnea.  8. CAUSE: "What do you think is causing the breathing problem?"      Unsure.  9. OTHER SYMPTOMS: "Do you have any other symptoms? (e.g., dizziness, runny nose, cough, chest pain, fever)     Medial chest pain between breast pinching pain yesterday while exerting herself lasted less than 5 minutes.  10. O2 SATURATION MONITOR:  "Do you use an oxygen saturation monitor (pulse oximeter) at home?" If Yes, ask: "What is your reading (oxygen level) today?" "  What is your usual oxygen saturation reading?" (e.g., 95%)      Yes, she states it was 96% yesterday.  11. PREGNANCY: "Is there any chance you are pregnant?" "When was your last menstrual period?"       N/A.  12. TRAVEL: "Have you traveled out of the country in the last month?" (e.g., travel history, exposures)       Denies.  Protocols used: Breathing Difficulty-A-AH, Chest Pain-A-AH

## 2024-01-31 ENCOUNTER — Other Ambulatory Visit

## 2024-01-31 LAB — CBC WITH DIFFERENTIAL/PLATELET
Basophils Absolute: 0 10*3/uL (ref 0.0–0.1)
Basophils Relative: 1 % (ref 0.0–3.0)
Eosinophils Absolute: 0.1 10*3/uL (ref 0.0–0.7)
Eosinophils Relative: 2.6 % (ref 0.0–5.0)
HCT: 33.1 % — ABNORMAL LOW (ref 36.0–46.0)
Hemoglobin: 10.3 g/dL — ABNORMAL LOW (ref 12.0–15.0)
Lymphocytes Relative: 31.7 % (ref 12.0–46.0)
Lymphs Abs: 1.6 10*3/uL (ref 0.7–4.0)
MCHC: 31.1 g/dL (ref 30.0–36.0)
MCV: 72.7 fl — ABNORMAL LOW (ref 78.0–100.0)
Monocytes Absolute: 0.4 10*3/uL (ref 0.1–1.0)
Monocytes Relative: 7.8 % (ref 3.0–12.0)
Neutro Abs: 2.8 10*3/uL (ref 1.4–7.7)
Neutrophils Relative %: 56.9 % (ref 43.0–77.0)
Platelets: 261 10*3/uL (ref 150.0–400.0)
RBC: 4.56 Mil/uL (ref 3.87–5.11)
RDW: 16.9 % — ABNORMAL HIGH (ref 11.5–15.5)
WBC: 5 10*3/uL (ref 4.0–10.5)

## 2024-01-31 LAB — BASIC METABOLIC PANEL WITH GFR
BUN: 19 mg/dL (ref 6–23)
CO2: 28 meq/L (ref 19–32)
Calcium: 9.2 mg/dL (ref 8.4–10.5)
Chloride: 105 meq/L (ref 96–112)
Creatinine, Ser: 0.75 mg/dL (ref 0.40–1.20)
GFR: 79.67 mL/min (ref >60.00)
Glucose, Bld: 109 mg/dL — ABNORMAL HIGH (ref 70–99)
Potassium: 4.6 meq/L (ref 3.5–5.1)
Sodium: 140 meq/L (ref 135–145)

## 2024-01-31 NOTE — Addendum Note (Signed)
 Addended by: Juel Nutley T on: 01/31/2024 03:56 PM   Modules accepted: Orders

## 2024-02-01 NOTE — Addendum Note (Signed)
 Addended by: Genette Kent on: 02/01/2024 02:18 PM   Modules accepted: Orders

## 2024-02-02 ENCOUNTER — Ambulatory Visit

## 2024-02-03 ENCOUNTER — Other Ambulatory Visit: Payer: Self-pay

## 2024-02-03 ENCOUNTER — Encounter: Payer: Self-pay | Admitting: Family Medicine

## 2024-02-03 DIAGNOSIS — E611 Iron deficiency: Secondary | ICD-10-CM

## 2024-02-04 ENCOUNTER — Encounter: Payer: Self-pay | Admitting: Family Medicine

## 2024-02-06 ENCOUNTER — Other Ambulatory Visit

## 2024-02-06 DIAGNOSIS — E611 Iron deficiency: Secondary | ICD-10-CM | POA: Diagnosis not present

## 2024-02-07 ENCOUNTER — Telehealth: Payer: Self-pay | Admitting: Student in an Organized Health Care Education/Training Program

## 2024-02-07 DIAGNOSIS — D508 Other iron deficiency anemias: Secondary | ICD-10-CM

## 2024-02-07 LAB — IRON,TIBC AND FERRITIN PANEL
%SAT: 6 % — ABNORMAL LOW (ref 16–45)
Ferritin: 7 ng/mL — ABNORMAL LOW (ref 16–288)
Iron: 24 ug/dL — ABNORMAL LOW (ref 45–160)
TIBC: 408 ug/dL (ref 250–450)

## 2024-02-07 MED ORDER — POLYSACCHARIDE IRON COMPLEX 150 MG PO CAPS: 150.0000 mg | ORAL_CAPSULE | Freq: Every day | ORAL | 1 refills | Status: DC

## 2024-02-07 NOTE — Telephone Encounter (Signed)
 I spoke with patient by phone about labs that show new iron deficiency anemia, I think this is the culprit for her dyspnea on exertion.  Unclear etiology, she had a colonoscopy less than 2 years ago that was reassuring with only one 3 mm polyp that was removed.  No history of gastric bypass or other malabsorptive state.  I am going to start oral iron supplementation with iron polysaccharide 150 mg once daily.  I told her about GI side effects and ways to minimize those.  Will arrange for follow-up in 1 month with either myself or Dr. Paulla Bossier.  Would like to see iron levels improving, or we can arrange for iron infusion.

## 2024-02-07 NOTE — Telephone Encounter (Signed)
 1 month follow up scheduled for June 3rd with Dr.Tabori

## 2024-02-13 ENCOUNTER — Ambulatory Visit: Payer: Medicare HMO | Admitting: Physician Assistant

## 2024-02-13 NOTE — Progress Notes (Addendum)
 Cardiology Office Note    Patient Name: [name] [name] Date of Encounter: 02/13/2024  Primary Care Provider:  Jess Morita, MD Primary Cardiologist:  Jerryl Morin, DO Primary Electrophysiologist: None   Past Medical History    Past Medical History:  Diagnosis Date   Allergy 12/19/2007   Bronchitis    Cataract    Cognitive dysfunction 07/14/2022   Coronary artery calcification seen on CAT scan 04/09/2021   DJD (degenerative joint disease)    DOE (dyspnea on exertion) 04/09/2021   GERD (gastroesophageal reflux disease) 12/19/2007   Glaucoma    History of adenomatous colonic polyps 01/17/2017   Hypercholesterolemia    Hypothyroidism    IBS (irritable bowel syndrome)    Malignant melanoma of neck    Mixed hyperlipidemia 04/09/2021   OSA (obstructive sleep apnea) 12/27/2017   no CPAP   Osteoporosis    Plantar fasciitis    Postoperative nausea and vomiting 09/02/2015   Precordial pain 04/09/2021   Presence of shoulder implant 01/21/2021   Primary hypertension 12/19/2007   Primary localized osteoarthritis of left knee    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 female with a PMH of coronary calcifications, family history of CAD, OSA (not on CPAP), malignant melanoma s/p excision, GERD, hypothyroidism, HLD who presents today for complaint of shortness of breath and chest pain.  [name] was seen initially by Dr. Emmette Harms on 04/09/2021 with complaint of shortness of breath.  She had previously completed a nuclear stress test in 03/2017 that was low risk and normal.  2D echo was also completed on 03/2017 that showed EF of 55 to 60% with grade 1 DD and mild TR.  During her visit she endorsed some precordial discomfort and she underwent an exercise stress test that was normal.  She wore a Holter monitor for palpitations and elevated heart  rate that showed sinus rhythm with no abnormalities.  She was seen by her PCP with complaint of expressive aphasia and underwent MRI of the brain that was nonrevealing.  She had a repeat MRI on 01/2022 that showed mild small chronic vessel disease and mild-moderate atrophy.  She had a neuropsych evaluation on 07/21/2022 that suggested essential tremor or Parkinson's disease with mild cognitive impairment.  She was seen by her PCP on 01/30/2024 with complaint of chest discomfort and dyspnea on exertion.  She endorsed dyspnea after walking up 2 flights of stairs and a pinching sensation in the middle of her chest.  She had a 2D echo ordered but not yet completed.  [name] presents today with her husband for complaint of chest pain and shortness of breath. She has experienced an increased heart rate over the past few weeks, noticeable during activities such as walking. Palpitations occur frequently, even while sitting, and are described as a 'really strong beat'. No dizziness or skipped beats accompany these episodes. She recalls a previous episode of chest pain after walking up three flights of stairs, described as a 'pinch' between her breasts lasting about 30 minutes. This episode required her to stop and rest, but she did not experience dizziness or skipped beats. She started taking an iron  tablet last Wednesday for anemia. There have been no changes in heart rate related to the initiation of this medication. Her hemoglobin level is currently 10.3, indicating iron  deficiency anemia. She regularly donates blood  every six to nine weeks and reports feeling out of breath with activities such as carrying laundry up and down stairs.  Recently, her primary care doctor ordered an echocardiogram for chest pain and shortness of breath, but it has not yet been completed. Patient denies PND, orthopnea, nausea, vomiting, dizziness, syncope, edema, weight gain, or early satiety.  Discussed the use of AI scribe software  for clinical note transcription with the patient, who gave verbal consent to proceed.  History of Present Illness   Review of Systems  Please see the history of present illness.    All other systems reviewed and are otherwise negative except as noted above.  Physical Exam     Wt Readings from Last 3 Encounters:  01/30/24 203 lb (92.1 kg)  12/02/23 200 lb (90.7 kg)  06/01/23 195 lb 6 oz (88.6 kg)   ZO:XWRUE were no vitals filed for this visit.,There is no height or weight on file to calculate BMI. GEN: Well nourished, well developed in no acute distress Neck: No JVD; No carotid bruits Pulmonary: Clear to auscultation without rales, wheezing or rhonchi  Cardiovascular: Normal rate. Regular rhythm. Normal S1. Normal S2.   Murmurs: 1/6 systolic murmur ABDOMEN: Soft, non-tender, non-distended EXTREMITIES:  No edema; No deformity   EKG/LABS/ Recent Cardiac Studies   ECG personally reviewed by me today -sinus rhythm with rate of 84 bpm and no acute changes consistent with previous EKG.  Risk Assessment/Calculations:          Lab Results  Component Value Date   WBC 5.0 01/31/2024   HGB 10.3 (L) 01/31/2024   HCT 33.1 (L) 01/31/2024   MCV 72.7 (L) 01/31/2024   PLT 261.0 01/31/2024   Lab Results  Component Value Date   CREATININE 0.75 01/31/2024   BUN 19 01/31/2024   NA 140 01/31/2024   K 4.6 01/31/2024   CL 105 01/31/2024   CO2 28 01/31/2024   Lab Results  Component Value Date   CHOL 178 12/02/2023   HDL 75.10 12/02/2023   LDLCALC 84 12/02/2023   TRIG 96.0 12/02/2023   CHOLHDL 2 12/02/2023    Lab Results  Component Value Date   HGBA1C 5.5 03/18/2015   Assessment & Plan    Assessment and Plan Assessment & Plan  1.  Dyspnea on exertion: -Exertional shortness of breath, possibly anemia or cardiac-related. Echocardiogram pending. - Schedule and complete echocardiogram.  2.  Chest pain /coronary calcifications: -Intermittent exertional chest pain, possibly  ischemic or angina-related, with normal 2022 stress test. Discussed PET stress test risks; she agreed to proceed. - Order cardiac PET stress test - Advise aspirin for recurrent chest pain. - Continue current GDMT with ASA 325 mg, Zocor  20 mg  3.  Iron  deficiency anemia: Iron  deficiency anemia with hemoglobin 10.3, hematocrit 33.1. Symptoms include shortness of breath and palpitations. Iron  supplementation initiated. - Continue iron  supplementation. - Monitor hemoglobin and hematocrit levels. - Advise against frequent blood donation.  4.  Hyperlipidemia: - Patient's LDL cholesterol is 84 - Continue Zocor  20 mg daily  5.  Essential hypertension: - Patient's blood pressure today was stable at 118/68 -Continue losartan  50 mg daily     Disposition: Follow-up with Kardie Tobb, DO or APP in 2 months Informed Consent   Shared Decision Making/Informed Consent The risks [chest pain, shortness of breath, cardiac arrhythmias, dizziness, blood pressure fluctuations, myocardial infarction, stroke/transient ischemic attack, nausea, vomiting, allergic reaction, radiation exposure, metallic taste sensation and life-threatening complications (estimated to be 1 in 10,000)], benefits (  risk stratification, diagnosing coronary artery disease, treatment guidance) and alternatives of a cardiac PET stress test were discussed in detail with Ms. Kubin and she agrees to proceed.      Signed, Francene Ing, Retha Cast, NP 02/13/2024, 12:49 PM [facility]

## 2024-02-14 ENCOUNTER — Ambulatory Visit: Attending: Nurse Practitioner | Admitting: Nurse Practitioner

## 2024-02-14 ENCOUNTER — Ambulatory Visit
Admission: RE | Admit: 2024-02-14 | Discharge: 2024-02-14 | Disposition: A | Discharge status: Home / Self Care | Source: Ambulatory Visit | Attending: Family Medicine | Admitting: Family Medicine

## 2024-02-14 ENCOUNTER — Encounter: Payer: Self-pay | Admitting: Physician Assistant

## 2024-02-14 ENCOUNTER — Encounter: Payer: Self-pay | Admitting: Nurse Practitioner

## 2024-02-14 ENCOUNTER — Ambulatory Visit (INDEPENDENT_AMBULATORY_CARE_PROVIDER_SITE_OTHER): Payer: Medicare HMO | Admitting: Physician Assistant

## 2024-02-14 VITALS — BP 118/68 | HR 106 | Ht 65.0 in | Wt 205.0 lb

## 2024-02-14 VITALS — BP 144/81 | HR 95 | Resp 20 | Ht 65.0 in | Wt 203.0 lb

## 2024-02-14 DIAGNOSIS — I2510 Atherosclerotic heart disease of native coronary artery without angina pectoris: Secondary | ICD-10-CM | POA: Diagnosis not present

## 2024-02-14 DIAGNOSIS — R413 Other amnesia: Secondary | ICD-10-CM | POA: Diagnosis not present

## 2024-02-14 DIAGNOSIS — E785 Hyperlipidemia, unspecified: Secondary | ICD-10-CM

## 2024-02-14 DIAGNOSIS — R079 Chest pain, unspecified: Secondary | ICD-10-CM

## 2024-02-14 DIAGNOSIS — Z1231 Encounter for screening mammogram for malignant neoplasm of breast: Secondary | ICD-10-CM

## 2024-02-14 DIAGNOSIS — D509 Iron deficiency anemia, unspecified: Secondary | ICD-10-CM

## 2024-02-14 DIAGNOSIS — I10 Essential (primary) hypertension: Secondary | ICD-10-CM | POA: Diagnosis not present

## 2024-02-14 DIAGNOSIS — R0609 Other forms of dyspnea: Secondary | ICD-10-CM

## 2024-02-14 DIAGNOSIS — R251 Tremor, unspecified: Secondary | ICD-10-CM | POA: Diagnosis not present

## 2024-02-14 NOTE — Patient Instructions (Signed)
 Follow up in 6 months Repeat the Neuropsychological evaluation   Recommend following up with GI for bowel incontinence and sever iron  deficiency anemia Consider hematology evaluation fro severe iron  deficiency anemia Recommend continue PT for cramps

## 2024-02-14 NOTE — Progress Notes (Signed)
 Assessment/Plan:   Mild Cognitive impairment   [name] [name] is a very pleasant 73 y.o. RH female with a history of Mild Cognitive Impairment likely due to vascular disease,  symptomatic anemia,  and untreated OSA (not concerning for PD or AD at this time) per neuropsych evaluation October 2023 presenting today in follow-up for evaluation of memory loss. Patient is not on antidementia medication at this time.  Memory is stable.  She is able to participate on her ADLs and to drive without difficulties.  MMSE today is 29/30.  It is possible that symptomatic anemia may be contributing to some of her memory issues at this time.  She has not had a formal evaluation for it.  As recalled, she had a prior history of elevated redblood cell count, and now she has become anemia.  He has been recommended to survey the future she needs to have a formal workup, which may include GI and hematology evaluation.  Currently she is taking  Niferex 150 mg but her ferritin is in the single digits (7) with percent saturation at 6.  Her tremors continue to be minimal, stable from prior visit, not interfering with her ADLs, without any other parkinsonian features.      Recommendations:   Follow up in 6 months. Repeat neuropsych testing for clarity of diagnosis and disease trajectory No indication for antidementia medication at this time Recommend good control of cardiovascular risk factors Continue to control mood as per PCP Continue to monitor tremors Recommend following up on symptomatic anemia, recommend GI evaluation and hematological consultation to further investigate its etiology and possible iron  infusions    Subjective:   This patient is here alone. Previous records as well as any outside records available were reviewed prior to todays visit.   Patient was last seen on 08/16/2023 with MMSE 30/30    Any changes in memory since last visit? ".  Continues to have difficulty retrieving the right word in  a sentence.  Denies difficulty remembering conversations and names.  She has symptomatic iron  deficiency anemia which has been affecting her "fogginess in head". She enjoys doing crossword puzzles and word finding. repeats oneself?  Endorsed Disoriented when walking into a room?  Patient denies    Misplacing objects?  Patient denies   Wandering behavior?   Denies.   Any personality changes since last visit?  Denies.   Any worsening depression?: denies   Hallucinations or paranoia?  denies   Seizures?   denies    Any sleep changes? Sleeps well . Occasionally she may have a vivid dream, denies REM behavior or sleepwalking   Sleep apnea?  Mild, she uses a mouthpiece nightly.  She is followed by pulmonary.   Any hygiene concerns?   denies   Independent of bathing and dressing?  Endorsed  Does the patient needs help with medications? Patient is in charge   Who is in charge of the finances?  Patient is in charge, having a harder time transferring the information from check to the checkbook.  Has more difficulty with the tips at the restaurant.    Any changes in appetite?  denies     Patient have trouble swallowing?  denies   Does the patient cook?  "Not a lot ".  Denies any kitchen accidents.  Any kitchen accidents such as leaving the stove on?   denies   Any headaches?    denies   Vision changes? denies Chronic pain?  She has received C5-C7 injection, she  does not want any further ones because "it did not work".  She prefers doing physical therapy and staying active. Ambulates with difficulty?    denies   Recent falls or head injuries?    denies      Unilateral weakness, numbness or tingling?   denies   Any tremors?  Minimal essential tremors, bilaterally around (R greater than L), no new issues.  No other parkinsonian signs. Any anosmia?    denies   Any incontinence of urine? Denies Any bowel dysfunction?  Endorsed, she has a history of anal incontinence, has tried toning exercises, she is to  make an appointment with GI to discuss any other new treatments, devices, etc.  In addition, she is interested in seeing GI in view of her recent anemia.     Patient lives with her husband    Does the patient drive?  Yes, denies any issues   Past Medical History:  Diagnosis Date   Allergy 12/19/2007   Bronchitis    Cataract    Cognitive dysfunction 07/14/2022   Coronary artery calcification seen on CAT scan 04/09/2021   DJD (degenerative joint disease)    DOE (dyspnea on exertion) 04/09/2021   GERD (gastroesophageal reflux disease) 12/19/2007   Glaucoma    History of adenomatous colonic polyps 01/17/2017   Hypercholesterolemia    Hypothyroidism    IBS (irritable bowel syndrome)    Malignant melanoma of neck    Mixed hyperlipidemia 04/09/2021   OSA (obstructive sleep apnea) 12/27/2017   no CPAP   Osteoporosis    Plantar fasciitis    Postoperative nausea and vomiting 09/02/2015   Precordial pain 04/09/2021   Presence of shoulder implant 01/21/2021   Primary hypertension 12/19/2007   Primary localized osteoarthritis of left knee    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/21/2021   Tremor of both hands 05/21/2020   Vitamin D  deficiency 09/21/2009     Past Surgical History:  Procedure Laterality Date   ANAL RECTAL MANOMETRY N/A 11/16/2019   Procedure: ANO RECTAL MANOMETRY;  Surgeon: Sergio Dandy, MD;  Location: [facility];  Service: Endoscopy;  Laterality: N/A;   BUNIONECTOMY  2004   left   CATARACT EXTRACTION W/ INTRAOCULAR LENS  IMPLANT, BILATERAL     COLONOSCOPY     EPIDURAL CATHETER INSERTION     L7   FOOT OSTEOTOMY  2003   right   HERNIA REPAIR  1957   JOINT REPLACEMENT     KNEE ARTHROSCOPY  1987&2003   left   MELANOMA EXCISION Right    shoulder   POLYPECTOMY     REVERSE SHOULDER ARTHROPLASTY Right 01/21/2021   Procedure: REVERSE SHOULDER ARTHROPLASTY;  Surgeon: Micheline Ahr, MD;  Location: [facility];  Service: Orthopedics;   Laterality: Right;   SHOULDER ARTHROSCOPY WITH OPEN ROTATOR CUFF REPAIR  2013   right   SKIN SURGERY  01/2004   Melanoma surg left shoulder   TOENAIL EXCISION  10/04/2017   TOTAL KNEE ARTHROPLASTY Left 09/01/2015   Procedure: TOTAL KNEE ARTHROPLASTY;  Surgeon: Elly Habermann, MD;  Location: [facility] OR;  Service: Orthopedics;  Laterality: Left;   TUBAL LIGATION  1990     PREVIOUS MEDICATIONS:   CURRENT MEDICATIONS:  Outpatient Encounter Medications as of 02/14/2024  Medication Sig   aspirin 325 MG tablet Take 325 mg by mouth daily.   B Complex-C (SUPER B COMPLEX PO) Take 1 tablet by mouth daily.    brimonidine (ALPHAGAN) 0.2 % ophthalmic solution 1 drop 3 (  three) times daily.   cetirizine (ZYRTEC) 10 MG tablet Take 10 mg by mouth daily.   Cholecalciferol  (VITAMIN D3) 5000 UNITS CAPS Take 5,000 Units by mouth daily.    Coenzyme Q10 (COQ10) 200 MG CAPS Take 1 capsule by mouth daily.    COMIRNATY syringe    doxycycline  (ADOXA) 50 MG tablet Take 50 mg by mouth daily. For rosacea   iron  polysaccharides (NIFEREX) 150 MG capsule Take 1 capsule (150 mg total) by mouth daily.   losartan  (COZAAR ) 50 MG tablet TAKE 1 TABLET EVERY DAY   Magnesium  200 MG TABS Take 200 mg by mouth in the morning and at bedtime.    Multiple Vitamins-Minerals (MULTIVITAMIN PO) Take 1 tablet by mouth daily.   NP THYROID  60 MG tablet TAKE 1 TABLET(60 MG) BY MOUTH DAILY BEFORE BREAKFAST   omeprazole  (PRILOSEC) 20 MG capsule TAKE 1 CAPSULE EVERY DAY   simvastatin  (ZOCOR ) 20 MG tablet TAKE 1 TABLET AT BEDTIME   SYSTANE BALANCE 0.6 % SOLN SMARTSIG:1 Drop(s) In Eye(s) PRN   Travoprost, BAK Free, (TRAVATAN) 0.004 % SOLN ophthalmic solution Place 1 drop into both eyes at bedtime.    tretinoin (RETIN-A) 0.05 % cream Apply topically at bedtime.   Facility-Administered Encounter Medications as of 02/14/2024  Medication   0.9 %  sodium chloride  infusion     Objective:     PHYSICAL EXAMINATION:    VITALS:   Vitals:    02/14/24 1249 02/14/24 1349  BP: (!) 174/84 (!) 144/81  Pulse: 95   Resp: 20   SpO2: 100%   Weight: 203 lb (92.1 kg)   Height: 5\' 5"  (1.651 m)     GEN:  The patient appears stated age and is in NAD. HEENT:  Normocephalic, atraumatic.   Neurological examination:  General: NAD, well-groomed, appears stated age. Orientation: The patient is alert. Oriented to person, place and date Cranial nerves: There is good facial symmetry.The speech is fluent and clear. No aphasia or dysarthria. Fund of knowledge is appropriate. Recent memory impaired and remote memory is normal.  Attention and concentration are normal.  Able to name objects and repeat phrases.  Hearing is intact to conversational tone .   Delayed recall 2/3 Sensation: Sensation is intact to light touch throughout Motor: Strength is at least antigravity x4. DTR's 2/4 in UE/LE       No data to display             02/14/2024    5:00 PM 08/16/2023   11:00 AM 03/01/2023    9:00 AM  MMSE - Mini Mental State Exam  Orientation to time 5 5 4   Orientation to Place 5 5 5   Registration 3 3 3   Attention/ Calculation 5 5 5   Recall 2 3 3   Language- name 2 objects 2 2 2   Language- repeat 1 1 1   Language- follow 3 step command 3 3 3   Language- read & follow direction 1 1 1   Write a sentence 1 1 1   Copy design 1 1 1   Total score 29 30 29        Movement examination: Tone: There is normal tone in the UE/LE.  No cogwheeling is noted. Abnormal movements: Very mild, bilateral, right greater than left intention tremor, no myoclonus.  No asterixis.   Coordination:  There is no decremation with RAM's. Normal finger to nose  Gait and Station: The patient has no difficulty arising out of a deep-seated chair without the use of the hands. The patient's stride  length is good.  Gait is cautious and narrow.   Thank you for allowing us  the opportunity to participate in the care of this nice patient. Please do not hesitate to contact us  for any  questions or concerns.   Total time spent on today's visit was 30 minutes dedicated to this patient today, preparing to see patient, examining the patient, ordering tests and/or medications and counseling the patient, documenting clinical information in the EHR or other health record, independently interpreting results and communicating results to the patient/family, discussing treatment and goals, answering patient's questions and coordinating care.  Cc:  Tabori, Katherine E, MD  Tex Filbert 02/14/2024 5:33 PM

## 2024-02-14 NOTE — Patient Instructions (Signed)
 Medication Instructions:  Your physician recommends that you continue on your current medications as directed. Please refer to the Current Medication list given to you today. *If you need a refill on your cardiac medications before your next appointment, please call your pharmacy*  Lab Work: None ordered If you have labs (blood work) drawn today and your tests are completely normal, you will receive your results only by: MyChart Message (if you have MyChart) OR A paper copy in the mail If you have any lab test that is abnormal or we need to change your treatment, we will call you to review the results.  Testing/Procedures: PLEASE ECHOCARDIOGRAM CARDIAC PET STRESS TEST  Follow-Up: At [facility], you and your health needs are our priority.  As part of our continuing mission to provide you with exceptional heart care, our providers are all part of one team.  This team includes your primary Cardiologist (physician) and Advanced Practice Providers or APPs (Physician Assistants and Nurse Practitioners) who all work together to provide you with the care you need, when you need it.  Your next appointment:   2 month(s)  Provider:   Charles Connor, NP        We recommend signing up for the patient portal called "MyChart".  Sign up information is provided on this After Visit Summary.  MyChart is used to connect with patients for Virtual Visits (Telemedicine).  Patients are able to view lab/test results, encounter notes, upcoming appointments, etc.  Non-urgent messages can be sent to your provider as well.   To learn more about what you can do with MyChart, go to [email or web address].   Other Instructions      Please report to Radiology at the [facility] Main Entrance 30 minutes early for your test.  [address] [place], [place] [address]  How to Prepare for Your Cardiac PET/CT Stress Test:  Nothing to eat or drink, except water, 3 hours prior to arrival time.  NO  caffeine/decaffeinated products, or chocolate 12 hours prior to arrival. (Please note decaffeinated beverages (teas/coffees) still contain caffeine).  If you have caffeine within 12 hours prior, the test will need to be rescheduled.  Medication instructions: Do not take erectile dysfunction medications for 72 hours prior to test (sildenafil, tadalafil) Do not take nitrates (isosorbide mononitrate, Ranexa) the day before or day of test Do not take tamsulosin the day before or morning of test Hold theophylline containing medications for 12 hours. Hold Dipyridamole 48 hours prior to the test.  Diabetic Preparation: If able to eat breakfast prior to 3 hour fasting, you may take all medications, including your insulin. Do not worry if you miss your breakfast dose of insulin - start at your next meal. If you do not eat prior to 3 hour fast-Hold all diabetes (oral and insulin) medications. Patients who wear a continuous glucose monitor MUST remove the device prior to scanning.  You may take your remaining medications with water.  NO perfume, cologne or lotion on chest or abdomen area. FEMALES - Please avoid wearing dresses to this appointment.  Total time is 1 to 2 hours; you may want to bring reading material for the waiting time.  IF YOU THINK YOU MAY BE PREGNANT, OR ARE NURSING PLEASE INFORM THE TECHNOLOGIST.  In preparation for your appointment, medication and supplies will be purchased.  Appointment availability is limited, so if you need to cancel or reschedule, please call the Radiology Department Scheduler at [phone] 24 hours in advance to  avoid a cancellation fee of $100.00  What to Expect When you Arrive:  Once you arrive and check in for your appointment, you will be taken to a preparation room within the Radiology Department.  A technologist or Nurse will obtain your medical history, verify that you are correctly prepped for the exam, and explain the procedure.  Afterwards, an  IV will be started in your arm and electrodes will be placed on your skin for EKG monitoring during the stress portion of the exam. Then you will be escorted to the PET/CT scanner.  There, staff will get you positioned on the scanner and obtain a blood pressure and EKG.  During the exam, you will continue to be connected to the EKG and blood pressure machines.  A small, safe amount of a radioactive tracer will be injected in your IV to obtain a series of pictures of your heart along with an injection of a stress agent.    After your Exam:  It is recommended that you eat a meal and drink a caffeinated beverage to counter act any effects of the stress agent.  Drink plenty of fluids for the remainder of the day and urinate frequently for the first couple of hours after the exam.  Your doctor will inform you of your test results within 7-10 business days.  For more information and frequently asked questions, please visit our website: [email or web address]  For questions about your test or how to prepare for your test, please call: Cardiac Imaging Nurse Navigators Office: [phone]

## 2024-02-27 ENCOUNTER — Encounter (HOSPITAL_COMMUNITY): Payer: Self-pay

## 2024-02-28 ENCOUNTER — Ambulatory Visit: Payer: Self-pay | Admitting: Psychology

## 2024-02-28 ENCOUNTER — Ambulatory Visit: Payer: Medicare HMO | Admitting: Psychology

## 2024-02-28 ENCOUNTER — Encounter: Payer: Self-pay | Admitting: Psychology

## 2024-02-28 DIAGNOSIS — G3184 Mild cognitive impairment, so stated: Secondary | ICD-10-CM | POA: Diagnosis not present

## 2024-02-28 DIAGNOSIS — R4189 Other symptoms and signs involving cognitive functions and awareness: Secondary | ICD-10-CM

## 2024-02-28 DIAGNOSIS — D509 Iron deficiency anemia, unspecified: Secondary | ICD-10-CM | POA: Insufficient documentation

## 2024-02-28 NOTE — Progress Notes (Signed)
   Psychometrician Note   Cognitive testing was administered to [name] [name] by Arnulfo Larch, B.S. (psychometrist) under the supervision of Dr. Melville Stade, Ph.D., ABPP, licensed psychologist on 02/28/2024. Ms. Dignan did not appear overtly distressed by the testing session per behavioral observation or responses across self-report questionnaires. Rest breaks were offered.    The battery of tests administered was selected by Dr. Zachary C. Merz, Ph.D., ABPP with consideration to Ms. Krysiak's current level of functioning, the nature of her symptoms, emotional and behavioral responses during interview, level of literacy, observed level of motivation/effort, and the nature of the referral question. This battery was communicated to the psychometrist. Communication between Dr. Melville Stade, Ph.D., ABPP and the psychometrist was ongoing throughout the evaluation and Dr. Melville Stade, Ph.D., ABPP was immediately accessible at all times. Dr. Zachary C. Merz, Ph.D., ABPP provided supervision to the psychometrist on the date of this service to the extent necessary to assure the quality of all services provided.    [name] [name] will return within approximately 1-2 weeks for an interactive feedback session with Dr. Kitty Perkins at which time her test performances, clinical impressions, and treatment recommendations will be reviewed in detail. Ms. Min understands she can contact our office should she require our assistance before this time.  A total of 150 minutes of billable time were spent face-to-face with Ms. Illingworth by the psychometrist. This includes both test administration and scoring time. Billing for these services is reflected in the clinical report generated by Dr. Melville Stade, Ph.D., ABPP  This note reflects time spent with the psychometrician and does not include test scores or any clinical interpretations made by Dr. Kitty Perkins. The full report will follow in a separate note.

## 2024-02-28 NOTE — Progress Notes (Unsigned)
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 errors (5) 5 Well Below Average        D-KEFS Verbal Fluency Test: Raw Score (Scaled Score) Raw Score (Scaled Score) Percentile     Letter Total Correct 42 (12) 54 (16) 98 Exceptionally High    Category Total Correct 32 (10) 32 (10) 50 Average    Category Switching Total Correct 10 (8) 9 (6) 9 Below Average    Category Switching Accuracy 8 (7) 8 (7) 16 Below Average      Total Set Loss Errors 1 (11) 0 (13) 84 Above Average      Total Repetition Errors 9 (4) 5 (8) 25 Average        Language:      Verbal Fluency Test: Raw Score (T Score) Raw Score (T Score) Percentile     Phonemic Fluency (FAS) 42 (54) 54 (65) 93 Well Above Average    Animal  Fluency 21 (58) 15 (45) 31 Average         NAB Language Module, Form 1: T Score T Score Percentile     Oral Production 48 50 50 Average    Naming 31/31 (59) 31/31 (59) 82 Above Average    Writing 55 55 69 Average        Visuospatial/Visuoconstruction:       Raw Score Raw Score Percentile   Clock Drawing: 10/10 6/10 --- Impaired        NAB Spatial Module, Form 1: T Score T Score Percentile      Figure Drawing Copy 51 73 99 Exceptionally High         Scaled Score Scaled Score Percentile   WAIS-IV Block Design: 11 8 25  Average        Mood and Personality:       Raw Score Raw Score Percentile   Beck Depression Inventory - II: 11 8 --- Within Normal Limits  PROMIS Anxiety Questionnaire: 15 7 --- None to Slight        Additional Questionnaires:       Raw Score Raw Score Percentile   PROMIS Sleep Disturbance Questionnaire: 14 19 --- None to Slight   Informed Consent and Coding/Compliance:   The current evaluation represents a clinical evaluation for the purposes previously outlined by the referral source and is in no way reflective of a forensic evaluation.   Ms. Bagby was provided with a verbal description of the nature and purpose of the present neuropsychological evaluation. Also reviewed were the foreseeable risks and/or discomforts and benefits of the procedure, limits of confidentiality, and mandatory reporting requirements of this provider. The patient was given the opportunity to ask questions and receive answers about the evaluation. Oral consent to participate was provided by the patient.   This evaluation was conducted by Melville Stade, Ph.D., ABPP-CN, board certified clinical neuropsychologist. Ms. Steines completed a clinical interview with Dr. Kitty Perkins, billed as one unit [phone], and 150 minutes of cognitive testing and scoring, billed as one unit [phone] and four additional units 96139. Psychometrist Arnulfo Larch, B.S. assisted Dr. Kitty Perkins with test administration and scoring procedures. As a separate and discrete service, one unit [phone] and two units 96133 (160 minutes) were billed for Dr. Arsenio Larger time spent in interpretation and report writing.

## 2024-02-29 ENCOUNTER — Institutional Professional Consult (permissible substitution): Payer: Medicare HMO | Admitting: Psychology

## 2024-02-29 ENCOUNTER — Encounter: Payer: Self-pay | Admitting: Psychology

## 2024-02-29 ENCOUNTER — Ambulatory Visit: Payer: Self-pay

## 2024-02-29 ENCOUNTER — Ambulatory Visit: Payer: Self-pay | Admitting: Nurse Practitioner

## 2024-02-29 ENCOUNTER — Encounter (HOSPITAL_COMMUNITY)
Admission: RE | Admit: 2024-02-29 | Discharge: 2024-02-29 | Disposition: A | Discharge status: Home / Self Care | Source: Ambulatory Visit | Attending: Nurse Practitioner | Admitting: Nurse Practitioner

## 2024-02-29 DIAGNOSIS — I2510 Atherosclerotic heart disease of native coronary artery without angina pectoris: Secondary | ICD-10-CM | POA: Diagnosis not present

## 2024-02-29 DIAGNOSIS — R079 Chest pain, unspecified: Secondary | ICD-10-CM | POA: Diagnosis not present

## 2024-02-29 DIAGNOSIS — E785 Hyperlipidemia, unspecified: Secondary | ICD-10-CM | POA: Diagnosis not present

## 2024-02-29 DIAGNOSIS — R0609 Other forms of dyspnea: Secondary | ICD-10-CM | POA: Diagnosis not present

## 2024-02-29 LAB — NM PET CT CARDIAC PERFUSION MULTI W/ABSOLUTE BLOODFLOW
MBFR: 1.99
Nuc Rest EF: 66 %
Nuc Stress EF: 67 %
Rest MBF: 0.86 ml/g/min
Rest Nuclear Isotope Dose: 24.3 mCi
ST Depression (mm): 0 mm
Stress MBF: 1.71 ml/g/min
Stress Nuclear Isotope Dose: 23.8 mCi
TID: 1.06

## 2024-02-29 MED ORDER — RUBIDIUM RB82 GENERATOR (RUBYFILL)
23.8100 | PACK | Freq: Once | INTRAVENOUS | Status: AC
  Administered 2024-02-29: 23.81 via INTRAVENOUS

## 2024-02-29 MED ORDER — REGADENOSON 0.4 MG/5ML IV SOLN
INTRAVENOUS | Status: AC
  Filled 2024-02-29: qty 5

## 2024-02-29 MED ORDER — RUBIDIUM RB82 GENERATOR (RUBYFILL)
24.2500 | PACK | Freq: Once | INTRAVENOUS | Status: AC
  Administered 2024-02-29: 24.25 via INTRAVENOUS

## 2024-02-29 MED ORDER — REGADENOSON 0.4 MG/5ML IV SOLN
0.4000 mg | Freq: Once | INTRAVENOUS | Status: AC
  Administered 2024-02-29: 0.4 mg via INTRAVENOUS

## 2024-03-01 ENCOUNTER — Telehealth: Payer: Self-pay | Admitting: Cardiology

## 2024-03-01 DIAGNOSIS — C44519 Basal cell carcinoma of skin of other part of trunk: Secondary | ICD-10-CM | POA: Diagnosis not present

## 2024-03-01 DIAGNOSIS — D485 Neoplasm of uncertain behavior of skin: Secondary | ICD-10-CM | POA: Diagnosis not present

## 2024-03-01 DIAGNOSIS — L570 Actinic keratosis: Secondary | ICD-10-CM | POA: Diagnosis not present

## 2024-03-01 DIAGNOSIS — G548 Other nerve root and plexus disorders: Secondary | ICD-10-CM | POA: Diagnosis not present

## 2024-03-01 DIAGNOSIS — Z86008 Personal history of in-situ neoplasm of other site: Secondary | ICD-10-CM | POA: Diagnosis not present

## 2024-03-01 DIAGNOSIS — Z129 Encounter for screening for malignant neoplasm, site unspecified: Secondary | ICD-10-CM | POA: Diagnosis not present

## 2024-03-01 DIAGNOSIS — Z85820 Personal history of malignant melanoma of skin: Secondary | ICD-10-CM | POA: Diagnosis not present

## 2024-03-01 MED ORDER — AMLODIPINE BESYLATE 2.5 MG PO TABS: 2.5000 mg | ORAL_TABLET | Freq: Every day | ORAL | 3 refills | Status: AC

## 2024-03-01 NOTE — Telephone Encounter (Signed)
 Spoke with pt regarding her PET CT scan results. Pt would like to speak to Charles Connor, NP for further information about her results. Pt had extensive questions. Pt also asked about the new medication she is supposed to start. Per Renelda Carry Dick's result note Amlodipine  2.5 mg once daily was to be started. Will send prescription to pt's pharmacy of choice. Pt was advised to monitor her blood pressure. Pt verbalized understanding. All questions if any were answered.

## 2024-03-01 NOTE — Telephone Encounter (Signed)
 Patient wants a call back to discuss questions she has about the results of her NM PET CT Cardiac test.  Patient is following up on the new medication and a 2 week supply was to be sent to [id number] - [place], [place] - [address] AT [facility] [address] and a 90 day prescription was to be sent to [facility] - [place], [place] - [address] .

## 2024-03-02 ENCOUNTER — Telehealth: Payer: Self-pay | Admitting: Nurse Practitioner

## 2024-03-02 NOTE — Telephone Encounter (Signed)
 Call was placed today per request to Ms. Hoefle to discuss further the results of her coronary PET/CT.  Patient had all questions answered to her satisfaction as well as explanation of treatment plan moving forward.  She was advised of the adverse effects of amlodipine and also instructed to contact us  if she notices any swelling in her lower extremities or increased dizziness with standing.  She was thankful for the call today and had no further questions at this time.  She was instructed to contact our office if she had any further questions.  Charles Connor, NP

## 2024-03-06 ENCOUNTER — Ambulatory Visit (INDEPENDENT_AMBULATORY_CARE_PROVIDER_SITE_OTHER): Admitting: Family Medicine

## 2024-03-06 ENCOUNTER — Encounter: Payer: Self-pay | Admitting: Family Medicine

## 2024-03-06 VITALS — BP 124/70 | HR 101 | Temp 98.0°F | Resp 18 | Ht 65.0 in | Wt 198.8 lb

## 2024-03-06 DIAGNOSIS — F419 Anxiety disorder, unspecified: Secondary | ICD-10-CM

## 2024-03-06 DIAGNOSIS — D509 Iron deficiency anemia, unspecified: Secondary | ICD-10-CM | POA: Diagnosis not present

## 2024-03-06 LAB — CBC WITH DIFFERENTIAL/PLATELET
Basophils Absolute: 0 10*3/uL (ref 0.0–0.1)
Basophils Relative: 0.6 % (ref 0.0–3.0)
Eosinophils Absolute: 0.1 10*3/uL (ref 0.0–0.7)
Eosinophils Relative: 1 % (ref 0.0–5.0)
HCT: 37.2 % (ref 36.0–46.0)
Hemoglobin: 11.6 g/dL — ABNORMAL LOW (ref 12.0–15.0)
Lymphocytes Relative: 25.1 % (ref 12.0–46.0)
Lymphs Abs: 1.4 10*3/uL (ref 0.7–4.0)
MCHC: 31.2 g/dL (ref 30.0–36.0)
MCV: 69.9 fl — ABNORMAL LOW (ref 78.0–100.0)
Monocytes Absolute: 0.4 10*3/uL (ref 0.1–1.0)
Monocytes Relative: 6.9 % (ref 3.0–12.0)
Neutro Abs: 3.8 10*3/uL (ref 1.4–7.7)
Neutrophils Relative %: 66.4 % (ref 43.0–77.0)
Platelets: 297 10*3/uL (ref 150.0–400.0)
RBC: 5.32 Mil/uL — ABNORMAL HIGH (ref 3.87–5.11)
RDW: 18.6 % — ABNORMAL HIGH (ref 11.5–15.5)
WBC: 5.8 10*3/uL (ref 4.0–10.5)

## 2024-03-06 LAB — IBC + FERRITIN
Ferritin: 8.7 ng/mL — ABNORMAL LOW (ref 10.0–291.0)
Iron: 34 ug/dL — ABNORMAL LOW (ref 42–145)
Saturation Ratios: 6.6 % — ABNORMAL LOW (ref 20.0–50.0)
TIBC: 516.6 ug/dL — ABNORMAL HIGH (ref 250.0–450.0)
Transferrin: 369 mg/dL — ABNORMAL HIGH (ref 212.0–360.0)

## 2024-03-06 NOTE — Progress Notes (Signed)
   Subjective:    Patient ID: [name] [name], female    DOB: 1951-02-01, 73 y.o.   MRN: [id number]  HPI Iron  deficiency anemia- pt's hemoglobin was 10.3 on 4/29.  Iron  panel showed iron  level of 24, % sat 6, ferritin 7.  Was started on oral iron  supplement and encouraged to f/u.  Some improvement in SOB.  Denies CP or palpitations.  UTD on colonoscopy- last done 2 yrs ago.  No blood in stool.  No dark or tarry stools prior to iron  use.  Anxiety- pt reports overall increase in anxiety due to multiple factors.  Pt reports anxiety is typically manageable.  Will flare at times.  Does not feel that she is at the point where medication is needed.   Review of Systems     Objective:   Physical Exam Vitals reviewed.  Constitutional:      General: She is not in acute distress.    Appearance: Normal appearance. She is well-developed. She is not ill-appearing.  HENT:     Head: Normocephalic and atraumatic.  Eyes:     Conjunctiva/sclera: Conjunctivae normal.     Pupils: Pupils are equal, round, and reactive to light.  Neck:     Thyroid : No thyromegaly.  Cardiovascular:     Rate and Rhythm: Normal rate and regular rhythm.     Heart sounds: Normal heart sounds. No murmur heard. Pulmonary:     Effort: Pulmonary effort is normal. No respiratory distress.     Breath sounds: Normal breath sounds.  Abdominal:     General: There is no distension.     Palpations: Abdomen is soft.     Tenderness: There is no abdominal tenderness.  Musculoskeletal:     Cervical back: Normal range of motion and neck supple.  Lymphadenopathy:     Cervical: No cervical adenopathy.  Skin:    General: Skin is warm and dry.  Neurological:     General: No focal deficit present.     Mental Status: She is alert and oriented to person, place, and time.  Psychiatric:        Mood and Affect: Mood normal.        Behavior: Behavior normal.           Assessment & Plan:

## 2024-03-06 NOTE — Assessment & Plan Note (Signed)
 Pt reports she has had an overall increase in anxiety due to multiple issues- her health, husband's health, family concerns.  At this point, she feels sxs are mostly manageable and is not interested in medication.  Discussed that this can be revisited at any time and that if the bad days start outnumbering the good ones, this is when we should consider tx.  Pt expressed understanding and is in agreement w/ plan.

## 2024-03-06 NOTE — Patient Instructions (Signed)
 Follow up as needed or as scheduled (in August) We'll notify you of your lab results and make any changes if needed Keep up the good work- you look great! Call with any questions or concerns Hang in there!!!

## 2024-03-06 NOTE — Assessment & Plan Note (Signed)
 New.  Hgb noted to be 10.3 on 4/29 and subsequent iron  panel showed low iron , % sat, and ferritin.  Was started on oral iron  daily.  Pt reports some improvement in fatigue and SOB.  She denies blood in stool but no testing has been done.  Discussed that if iron  levels remain low and hgb not improving- will need hematology referral and iFOB.  Pt expressed understanding and is in agreement w/ plan.

## 2024-03-07 ENCOUNTER — Ambulatory Visit: Payer: Medicare HMO | Admitting: Psychology

## 2024-03-07 ENCOUNTER — Ambulatory Visit: Payer: Self-pay | Admitting: Family Medicine

## 2024-03-07 ENCOUNTER — Other Ambulatory Visit: Payer: Self-pay | Admitting: Family Medicine

## 2024-03-07 DIAGNOSIS — G3184 Mild cognitive impairment, so stated: Secondary | ICD-10-CM | POA: Diagnosis not present

## 2024-03-07 NOTE — Progress Notes (Signed)
   Neuropsychology Feedback Session [facility] Department of Neurology  Reason for Referral:   ALLESSANDRA BERNARDI is a 73 y.o. right-handed Caucasian female referred by Tex Filbert, PA-C, to characterize her current cognitive functioning and assist with diagnostic clarity and treatment planning in the context of subjective cognitive decline.   Feedback:   [name] completed a comprehensive neuropsychological evaluation on 02/28/2024. Please refer to that encounter for the full report and recommendations. Briefly, results suggested performance variability surrounding processing speed, executive functioning, and all aspects of verbal learning and memory. While she struggled with her drawing of a clock, all other visuospatial tasks were appropriate. Relative to her previous evaluation in October 2023, mild decline was exhibited across executive functioning and clock drawing. Subtle to mild decline could also be argued across processing speed and encoding aspects of a story-based memory task. Broadly speaking, other assessed cognitive domains exhibited relative stability over time. [name] pattern across testing is nonspecific and continues to not strongly align with common neurodegenerative illnesses. Variability/weakness surrounding processing speed, executive functioning, and memory can certainly be seen in individuals with essential tremor presentations. [name] noted being recently diagnosed with iron  deficiency anemia. She also expressed concern surrounding blood pressure fluctuations and potential dysrhythmia. Her pattern of variability/weakness across testing could also be influenced by these factors, as well as chronic cardiovascular ailments, cerebrovascular disease, and sleep disturbances such as sleep apnea. It is unclear to what extent acute and quite severe testing anxiety played in current test performances.   [name] was accompanied by her husband  during the current feedback session. Content of the current session focused on the results of her neuropsychological evaluation. [name] was given the opportunity to ask questions and her questions were answered. She was encouraged to reach out should additional questions arise. A copy of her report was provided at the conclusion of the visit.      One unit 96132 (31 minutes) was billed for Dr. Arsenio Larger time spent preparing for, conducting, and documenting the current feedback session with [name].

## 2024-03-08 ENCOUNTER — Telehealth: Payer: Self-pay

## 2024-03-08 NOTE — Telephone Encounter (Signed)
 Placed at front dest for patients husband to pick up

## 2024-03-08 NOTE — Telephone Encounter (Signed)
 Copied from CRM [id number]. Topic: General - Other >> Mar 08, 2024  9:09 AM Kita Perish H wrote: Reason for CRM: Patient will send husband today to pick up Ifob stool test from office.  Tamanna [phone]

## 2024-03-12 ENCOUNTER — Other Ambulatory Visit: Payer: Self-pay | Admitting: Family Medicine

## 2024-03-12 ENCOUNTER — Ambulatory Visit (INDEPENDENT_AMBULATORY_CARE_PROVIDER_SITE_OTHER)

## 2024-03-12 ENCOUNTER — Ambulatory Visit: Payer: Self-pay | Admitting: Family Medicine

## 2024-03-12 DIAGNOSIS — D509 Iron deficiency anemia, unspecified: Secondary | ICD-10-CM

## 2024-03-12 LAB — FECAL OCCULT BLOOD, IMMUNOCHEMICAL: Fecal Occult Bld: NEGATIVE

## 2024-03-19 ENCOUNTER — Other Ambulatory Visit: Payer: Self-pay | Admitting: Family Medicine

## 2024-03-22 ENCOUNTER — Ambulatory Visit (HOSPITAL_COMMUNITY)
Admission: RE | Admit: 2024-03-22 | Discharge: 2024-03-22 | Disposition: A | Discharge status: Home / Self Care | Source: Ambulatory Visit | Attending: Cardiology | Admitting: Cardiology

## 2024-03-22 DIAGNOSIS — R0609 Other forms of dyspnea: Secondary | ICD-10-CM | POA: Diagnosis not present

## 2024-03-22 DIAGNOSIS — I361 Nonrheumatic tricuspid (valve) insufficiency: Secondary | ICD-10-CM

## 2024-03-22 LAB — ECHOCARDIOGRAM COMPLETE
Area-P 1/2: 2.96 cm2
S' Lateral: 2.9 cm

## 2024-03-26 ENCOUNTER — Other Ambulatory Visit: Payer: Self-pay | Admitting: Family Medicine

## 2024-03-26 ENCOUNTER — Ambulatory Visit: Payer: Self-pay | Admitting: Student in an Organized Health Care Education/Training Program

## 2024-04-04 ENCOUNTER — Encounter: Payer: Self-pay | Admitting: Family Medicine

## 2024-04-04 DIAGNOSIS — D509 Iron deficiency anemia, unspecified: Secondary | ICD-10-CM

## 2024-04-09 NOTE — Addendum Note (Signed)
 Addended by: Griselda Tosh E on: 04/09/2024 10:41 AM   Modules accepted: Orders

## 2024-04-10 ENCOUNTER — Other Ambulatory Visit (INDEPENDENT_AMBULATORY_CARE_PROVIDER_SITE_OTHER)

## 2024-04-10 DIAGNOSIS — D509 Iron deficiency anemia, unspecified: Secondary | ICD-10-CM | POA: Diagnosis not present

## 2024-04-10 LAB — IBC + FERRITIN
Ferritin: 7.8 ng/mL — ABNORMAL LOW (ref 10.0–291.0)
Iron: 45 ug/dL (ref 42–145)
Saturation Ratios: 10.2 % — ABNORMAL LOW (ref 20.0–50.0)
TIBC: 442.4 ug/dL (ref 250.0–450.0)
Transferrin: 316 mg/dL (ref 212.0–360.0)

## 2024-04-10 LAB — CBC WITH DIFFERENTIAL/PLATELET
Basophils Absolute: 0 K/uL (ref 0.0–0.1)
Basophils Relative: 0.9 % (ref 0.0–3.0)
Eosinophils Absolute: 0.1 K/uL (ref 0.0–0.7)
Eosinophils Relative: 2.7 % (ref 0.0–5.0)
HCT: 37.5 % (ref 36.0–46.0)
Hemoglobin: 11.7 g/dL — ABNORMAL LOW (ref 12.0–15.0)
Lymphocytes Relative: 33 % (ref 12.0–46.0)
Lymphs Abs: 1.8 K/uL (ref 0.7–4.0)
MCHC: 31.3 g/dL (ref 30.0–36.0)
MCV: 72.1 fl — ABNORMAL LOW (ref 78.0–100.0)
Monocytes Absolute: 0.3 K/uL (ref 0.1–1.0)
Monocytes Relative: 6.1 % (ref 3.0–12.0)
Neutro Abs: 3.1 K/uL (ref 1.4–7.7)
Neutrophils Relative %: 57.3 % (ref 43.0–77.0)
Platelets: 269 K/uL (ref 150.0–400.0)
RBC: 5.2 Mil/uL — ABNORMAL HIGH (ref 3.87–5.11)
RDW: 21.1 % — ABNORMAL HIGH (ref 11.5–15.5)
WBC: 5.4 K/uL (ref 4.0–10.5)

## 2024-04-11 ENCOUNTER — Ambulatory Visit: Payer: Self-pay | Admitting: Family Medicine

## 2024-04-11 NOTE — Progress Notes (Signed)
 Pt has been notified.

## 2024-04-11 NOTE — Progress Notes (Unsigned)
 Cardiology Office Note    Patient Name: [name] [name] Date of Encounter: 04/11/2024  Primary Care Provider:  Mahlon Comer BRAVO, MD Primary Cardiologist:  None Primary Electrophysiologist: None   Past Medical History    Past Medical History:  Diagnosis Date   Allergy 12/19/2007   Bronchitis    Cataract    Coronary artery calcification seen on CAT scan 04/09/2021   DJD (degenerative joint disease)    DOE (dyspnea on exertion) 04/09/2021   GERD (gastroesophageal reflux disease) 12/19/2007   Glaucoma    History of adenomatous colonic polyps 01/17/2017   Hypercholesterolemia    Hypothyroidism    IBS (irritable bowel syndrome)    Iron  deficiency anemia    Malignant melanoma of neck    Mild cognitive impairment 07/14/2022   Mixed hyperlipidemia 04/09/2021   OSA (obstructive sleep apnea) 12/27/2017   mouthguard; no CPAP   Osteoporosis    Plantar fasciitis    Postoperative nausea and vomiting 09/02/2015   Precordial pain 04/09/2021   Presence of shoulder implant 01/21/2021   Primary hypertension 12/19/2007   Primary localized osteoarthritis of left knee    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 female with a PMH of coronary calcifications, family history of CAD, OSA (not on CPAP), malignant melanoma s/p excision, GERD, hypothyroidism, HLD who presents today for 55-month follow-up.  [name] [name] was last seen on 02/14/2024 for evaluation of chest pain and shortness of breath.She completed a PET CT scan that showed a small area of fixed myocardium with abnormal wall motion that was deemed low risk due to small amount of involved myocardium.  She was found to have mildly abnormal myocardial blood flow and was started on amlodipine  2.5 mg for microvascular disease.  She also completed a 2D echo on 03/22/2024 that showed normal EF and no  significant valve abnormalities.  [name] [name] presents today with her husband for 7-month follow-up.  She reports resolution of chest pain and shortness of breath has improved.  She is tolerating amiodarone 2.5 mg without any adverse reactions. She is on simvastatin  for hyperlipidemia without recent dose adjustments. She is also on amlodipine , which she tolerates well, with stable blood pressure and no dizziness or swelling.  She reports some joint pain which may be related to statin and was advised to follow-up with her PCP for possible holiday from medication. Patient denies chest pain, palpitations, dyspnea, PND, orthopnea, nausea, vomiting, dizziness, syncope, edema, weight gain, or early satiety.  Discussed the use of AI scribe software for clinical note transcription with the patient, who gave verbal consent to proceed.  History of Present Illness   Review of Systems  Please see the history of present illness.    All other systems reviewed and are otherwise negative except as noted above.  Physical Exam    Wt Readings from Last 3 Encounters:  03/06/24 198 lb 12.8 oz (90.2 kg)  02/14/24 205 lb (93 kg)  02/14/24 203 lb (92.1 kg)   CD:Uyzmz were no vitals filed for this visit.,There is no height or weight on file to calculate BMI. GEN: Well nourished, well developed in no acute distress Neck: No JVD; No carotid bruits Pulmonary: Clear to auscultation without rales, wheezing or rhonchi  Cardiovascular: Normal rate. Regular rhythm. Normal S1. Normal S2.   Murmurs: There is no murmur.  ABDOMEN: Soft,  non-tender, non-distended EXTREMITIES:  No edema; No deformity   EKG/LABS/ Recent Cardiac Studies   ECG personally reviewed by me today -none completed today  Risk Assessment/Calculations:          Lab Results  Component Value Date   WBC 5.4 04/10/2024   HGB 11.7 (L) 04/10/2024   HCT 37.5 04/10/2024   MCV 72.1 (L) 04/10/2024   PLT 269.0 04/10/2024   Lab Results  Component  Value Date   CREATININE 0.75 01/31/2024   BUN 19 01/31/2024   NA 140 01/31/2024   K 4.6 01/31/2024   CL 105 01/31/2024   CO2 28 01/31/2024   Lab Results  Component Value Date   CHOL 178 12/02/2023   HDL 75.10 12/02/2023   LDLCALC 84 12/02/2023   TRIG 96.0 12/02/2023   CHOLHDL 2 12/02/2023    Lab Results  Component Value Date   HGBA1C 5.5 03/18/2015   Assessment & Plan   Assessment & Plan  1.  Dyspnea on exertion: -Exertional shortness of breath, possibly anemia or cardiac-related. - -2D echo was completed on 03/22/2024 showing 55-60% with no RWMA and normal valve function. - Patient is euvolemic on exam with no increased shortness of breath.  2.  Chest pain /coronary calcifications: - PET stress test completed no evidence of ischemia with small area of defect in the mid anterior location that was fixed consistent with prior scarring but no evidence of reversibility. - Today patient reports resolution to chest pain and tolerance of amlodipine  2.5  - Continue current GDMT with ASA 325 mg, Zocor  20 mg and amlodipine  2.5 mg   3.  Iron  deficiency anemia: Iron  deficiency anemia with hemoglobin 10.3, hematocrit 33.1. Symptoms include shortness of breath and palpitations. Iron  supplementation initiated. - Continue iron  supplementation. - Monitor hemoglobin and hematocrit levels. - Advise against frequent blood donation.   4.  Hyperlipidemia: - Patient's LDL cholesterol is 84 - Continue Zocor  20 mg daily   5.  Essential hypertension: - Patient's blood pressure today was stable at 120/62 -Continue losartan  50 mg daily and amlodipine  2.5 mg  Disposition: Follow-up with None or APP in 6 months    Signed, Wyn Raddle, Jackee Shove, NP 04/11/2024, 1:24 PM [facility]

## 2024-04-12 ENCOUNTER — Encounter: Payer: Self-pay | Admitting: Nurse Practitioner

## 2024-04-12 ENCOUNTER — Ambulatory Visit: Attending: Nurse Practitioner | Admitting: Nurse Practitioner

## 2024-04-12 VITALS — BP 120/62 | HR 75 | Ht 65.0 in | Wt 202.4 lb

## 2024-04-12 DIAGNOSIS — E785 Hyperlipidemia, unspecified: Secondary | ICD-10-CM

## 2024-04-12 DIAGNOSIS — I10 Essential (primary) hypertension: Secondary | ICD-10-CM | POA: Diagnosis not present

## 2024-04-12 DIAGNOSIS — R0609 Other forms of dyspnea: Secondary | ICD-10-CM

## 2024-04-12 DIAGNOSIS — I2510 Atherosclerotic heart disease of native coronary artery without angina pectoris: Secondary | ICD-10-CM

## 2024-04-12 DIAGNOSIS — D509 Iron deficiency anemia, unspecified: Secondary | ICD-10-CM

## 2024-04-12 NOTE — Patient Instructions (Signed)
 Medication Instructions:  Your physician recommends that you continue on your current medications as directed. Please refer to the Current Medication list given to you today. *If you need a refill on your cardiac medications before your next appointment, please call your pharmacy*  Lab Work: None ordered If you have labs (blood work) drawn today and your tests are completely normal, you will receive your results only by: MyChart Message (if you have MyChart) OR A paper copy in the mail If you have any lab test that is abnormal or we need to change your treatment, we will call you to review the results.  Testing/Procedures: None ordered  Follow-Up: At [facility], you and your health needs are our priority.  As part of our continuing mission to provide you with exceptional heart care, our providers are all part of one team.  This team includes your primary Cardiologist (physician) and Advanced Practice Providers or APPs (Physician Assistants and Nurse Practitioners) who all work together to provide you with the care you need, when you need it.  Your next appointment:   6 month(s)  Provider:   Kardie Tobb, MD   We recommend signing up for the patient portal called MyChart.  Sign up information is provided on this After Visit Summary.  MyChart is used to connect with patients for Virtual Visits (Telemedicine).  Patients are able to view lab/test results, encounter notes, upcoming appointments, etc.  Non-urgent messages can be sent to your provider as well.   To learn more about what you can do with MyChart, go to [email or web address].   Other Instructions

## 2024-04-20 ENCOUNTER — Ambulatory Visit: Payer: Self-pay

## 2024-04-20 NOTE — Telephone Encounter (Signed)
 FYI Only or Action Required?: Action required by provider: clinical question for provider.  Patient was last seen in primary care on 03/06/2024 by Mahlon Comer BRAVO, MD.  Called Nurse Triage reporting Leg Swelling.  Symptoms began x 3 days ago.  Interventions attempted: Other: elevation.  Symptoms are: unchanged.  Triage Disposition: See PCP When Office is Open (Within 3 Days)  Patient/caregiver understands and will follow disposition?: Yes      Copied from CRM [id number]. Topic: Clinical - Red Word Triage >> Apr 20, 2024 10:56 AM Thersia BROCKS wrote: [facility] that prompted transfer to Nurse Triage: swelling in ankles, has been elevating and using ice packs, patient stated it may be the cause of one her prescriptions Reason for Disposition  [1] MILD swelling of both ankles (i.e., pedal edema) AND [2] new-onset or getting worse  Answer Assessment - Initial Assessment Questions 1. ONSET: When did the swelling start? (e.g., minutes, hours, days)     X 3 days 2. LOCATION: What part of the leg is swollen?  Are both legs swollen or just one leg?     Bilateral swelling: worse in left ankle 3. SEVERITY: How bad is the swelling? (e.g., localized; mild, moderate, severe)     Mild to moderate 4. REDNESS: Is there redness or signs of infection?     no 5. PAIN: Is the swelling painful to touch? If Yes, ask: How painful is it?   (Scale 1-10; mild, moderate or severe)     no 6. FEVER: Do you have a fever? If Yes, ask: What is it, how was it measured, and when did it start?      no 7. CAUSE: What do you think is causing the leg swelling?     unknown 8. MEDICAL HISTORY: Do you have a history of blood clots (e.g., DVT), cancer, heart failure, kidney disease, or liver failure?     na 9. RECURRENT SYMPTOM: Have you had leg swelling before? If Yes, ask: When was the last time? What happened that time?     yes 10. OTHER SYMPTOMS: Do you have any other symptoms? (e.g.,  chest pain, difficulty breathing)       Difficulty breathing last 4 months 11. PREGNANCY: Is there any chance you are pregnant? When was your last menstrual period?       Na  Pt stated she has been reading about certain medications and found simvastatin  (ZOCOR ) 20 MG tablet could be the cause of her swelling. Pt wants to ask PCP if she should stop taking this medication of not.  Protocols used: Leg Swelling and Edema-A-AH

## 2024-04-20 NOTE — Telephone Encounter (Signed)
 Called patient to gather more information on leg swelling. Patient explained that it is not her legs that are swelling, its her ankles. The left ankle is swelling more than the right ankle is. No heat or redness around the area. Ice and elevation helps some. She requested that Dr. Jerrell look into this and give her a call back. I offered for patient to come in to be seen today but she is out of town and unable to come in.

## 2024-04-20 NOTE — Telephone Encounter (Signed)
 I spoke with the patient by phone.  Answered her questions.  She is having mild swelling of bilateral ankles.  May be related to amlodipine  2.5 mg.  Symptoms sound low risk.  Not functionally limiting. I recommended using compression stockings.  If symptoms worsen, I recommended stopping amlodipine  and coming back to town for in person evaluation.

## 2024-05-01 DIAGNOSIS — H401132 Primary open-angle glaucoma, bilateral, moderate stage: Secondary | ICD-10-CM | POA: Diagnosis not present

## 2024-05-01 DIAGNOSIS — H18413 Arcus senilis, bilateral: Secondary | ICD-10-CM | POA: Diagnosis not present

## 2024-05-01 DIAGNOSIS — H43813 Vitreous degeneration, bilateral: Secondary | ICD-10-CM | POA: Diagnosis not present

## 2024-05-01 DIAGNOSIS — Z961 Presence of intraocular lens: Secondary | ICD-10-CM | POA: Diagnosis not present

## 2024-05-02 DIAGNOSIS — C44519 Basal cell carcinoma of skin of other part of trunk: Secondary | ICD-10-CM | POA: Diagnosis not present

## 2024-05-09 ENCOUNTER — Other Ambulatory Visit (HOSPITAL_COMMUNITY)

## 2024-05-16 ENCOUNTER — Other Ambulatory Visit: Payer: Self-pay | Admitting: Family Medicine

## 2024-05-16 DIAGNOSIS — K219 Gastro-esophageal reflux disease without esophagitis: Secondary | ICD-10-CM

## 2024-06-01 ENCOUNTER — Ambulatory Visit (INDEPENDENT_AMBULATORY_CARE_PROVIDER_SITE_OTHER): Payer: Medicare HMO | Admitting: Family Medicine

## 2024-06-01 ENCOUNTER — Encounter: Payer: Self-pay | Admitting: Family Medicine

## 2024-06-01 VITALS — BP 120/60 | HR 78 | Temp 97.7°F | Wt 201.6 lb

## 2024-06-01 DIAGNOSIS — E782 Mixed hyperlipidemia: Secondary | ICD-10-CM | POA: Diagnosis not present

## 2024-06-01 DIAGNOSIS — F419 Anxiety disorder, unspecified: Secondary | ICD-10-CM | POA: Diagnosis not present

## 2024-06-01 DIAGNOSIS — I10 Essential (primary) hypertension: Secondary | ICD-10-CM | POA: Diagnosis not present

## 2024-06-01 DIAGNOSIS — E038 Other specified hypothyroidism: Secondary | ICD-10-CM | POA: Diagnosis not present

## 2024-06-01 LAB — CBC WITH DIFFERENTIAL/PLATELET
Basophils Absolute: 0.1 K/uL (ref 0.0–0.1)
Basophils Relative: 1 % (ref 0.0–3.0)
Eosinophils Absolute: 0.1 K/uL (ref 0.0–0.7)
Eosinophils Relative: 2.5 % (ref 0.0–5.0)
HCT: 37.6 % (ref 36.0–46.0)
Hemoglobin: 12 g/dL (ref 12.0–15.0)
Lymphocytes Relative: 36.3 % (ref 12.0–46.0)
Lymphs Abs: 2.2 K/uL (ref 0.7–4.0)
MCHC: 32 g/dL (ref 30.0–36.0)
MCV: 75.2 fl — ABNORMAL LOW (ref 78.0–100.0)
Monocytes Absolute: 0.5 K/uL (ref 0.1–1.0)
Monocytes Relative: 8.6 % (ref 3.0–12.0)
Neutro Abs: 3.1 K/uL (ref 1.4–7.7)
Neutrophils Relative %: 51.6 % (ref 43.0–77.0)
Platelets: 271 K/uL (ref 150.0–400.0)
RBC: 5 Mil/uL (ref 3.87–5.11)
RDW: 18.5 % — ABNORMAL HIGH (ref 11.5–15.5)
WBC: 6 K/uL (ref 4.0–10.5)

## 2024-06-01 LAB — LIPID PANEL
Cholesterol: 181 mg/dL (ref 0–200)
HDL: 54 mg/dL (ref >39.00)
LDL Cholesterol: 108 mg/dL — ABNORMAL HIGH (ref 0–99)
NonHDL: 127.46
Total CHOL/HDL Ratio: 3
Triglycerides: 97 mg/dL (ref 0.0–149.0)
VLDL: 19.4 mg/dL (ref 0.0–40.0)

## 2024-06-01 LAB — TSH: TSH: 1.51 u[IU]/mL (ref 0.35–5.50)

## 2024-06-01 LAB — BASIC METABOLIC PANEL WITH GFR
BUN: 15 mg/dL (ref 6–23)
CO2: 26 meq/L (ref 19–32)
Calcium: 9.3 mg/dL (ref 8.4–10.5)
Chloride: 105 meq/L (ref 96–112)
Creatinine, Ser: 0.75 mg/dL (ref 0.40–1.20)
GFR: 79.49 mL/min (ref >60.00)
Glucose, Bld: 104 mg/dL — ABNORMAL HIGH (ref 70–99)
Potassium: 4.4 meq/L (ref 3.5–5.1)
Sodium: 140 meq/L (ref 135–145)

## 2024-06-01 LAB — HEPATIC FUNCTION PANEL
ALT: 24 U/L (ref 0–35)
AST: 25 U/L (ref 0–37)
Albumin: 4.2 g/dL (ref 3.5–5.2)
Alkaline Phosphatase: 90 U/L (ref 39–117)
Bilirubin, Direct: 0.1 mg/dL (ref 0.0–0.3)
Total Bilirubin: 0.4 mg/dL (ref 0.2–1.2)
Total Protein: 7 g/dL (ref 6.0–8.3)

## 2024-06-01 MED ORDER — FLUOXETINE HCL 10 MG PO CAPS
10.0000 mg | ORAL_CAPSULE | Freq: Every day | ORAL | 3 refills | Status: AC
Start: 2024-06-01 — End: ?

## 2024-06-01 NOTE — Assessment & Plan Note (Signed)
 Deteriorated.  Pt is now willing to consider medication to help w/ anxiety and mood.  Will start low dose Fluoxetine  and monitor closely for improvement.  Pt expressed understanding and is in agreement w/ plan.

## 2024-06-01 NOTE — Patient Instructions (Addendum)
 Follow up in 6 weeks to recheck mood We'll notify you of your lab results and make any changes if needed START the Fluoxetine  once daily Continue to work on healthy diet and exercise as you are able Call with any questions or concerns Stay Safe!  Stay Healthy! Happy Labor Day!!!

## 2024-06-01 NOTE — Progress Notes (Signed)
   Subjective:    Patient ID: [name] [name], female    DOB: 1951/09/04, 73 y.o.   MRN: [id number]  HPI HTN- chronic problem, on Amlodipine  2.5mg  daily, Losartan  50mg  daily w/ good control.  Denies CP.  + ongoing SOB- following w/ Cards and Pulm.    Hyperlipidemia- chronic problem.  Previously on Simvastatin  20mg  daily.  Pt reports she stopped medication b/c when used in combo w/ Amlodipine  developed extensive swelling.  No abd pain, N/V.  Hypothyroid- chronic problem, on NP thyroid  60mg  daily.  + increased thirst.  Anxiety- ongoing issue.  Finds herself frequently tearful.  Feels like she's ready to discuss/start meds at this time.   Review of Systems For ROS see HPI     Objective:   Physical Exam Vitals reviewed.  Constitutional:      General: She is not in acute distress.    Appearance: Normal appearance. She is well-developed. She is not ill-appearing.  HENT:     Head: Normocephalic and atraumatic.  Eyes:     Conjunctiva/sclera: Conjunctivae normal.     Pupils: Pupils are equal, round, and reactive to light.  Neck:     Thyroid : No thyromegaly.  Cardiovascular:     Rate and Rhythm: Normal rate and regular rhythm.     Pulses: Normal pulses.     Heart sounds: Normal heart sounds. No murmur heard. Pulmonary:     Effort: Pulmonary effort is normal. No respiratory distress.     Breath sounds: Normal breath sounds.  Abdominal:     General: There is no distension.     Palpations: Abdomen is soft.     Tenderness: There is no abdominal tenderness.  Musculoskeletal:     Cervical back: Normal range of motion and neck supple.     Right lower leg: No edema.     Left lower leg: No edema.  Lymphadenopathy:     Cervical: No cervical adenopathy.  Skin:    General: Skin is warm and dry.  Neurological:     General: No focal deficit present.     Mental Status: She is alert and oriented to person, place, and time.  Psychiatric:        Mood and Affect: Mood normal.        Behavior:  Behavior normal.        Thought Content: Thought content normal.           Assessment & Plan:

## 2024-06-01 NOTE — Assessment & Plan Note (Signed)
 Chronic problem.  Well controlled today on Amlodipine  and Losartan .  Check labs due to ARB use but no anticipated med changes.  Will follow.

## 2024-06-01 NOTE — Assessment & Plan Note (Signed)
 Chronic problem.  Was previously on Simvastatin  20mg  daily but stopped b/c when she started Amlodipine , she developed extensive leg swelling.  Will check labs and determine if alternative medication is needed.  Pt expressed understanding and is in agreement w/ plan.

## 2024-06-01 NOTE — Assessment & Plan Note (Signed)
 Chronic problem.  Currently on NP thyroid  60mg  daily.  Check labs.  Adjust meds prn

## 2024-06-05 ENCOUNTER — Other Ambulatory Visit: Payer: Self-pay

## 2024-06-05 ENCOUNTER — Ambulatory Visit: Payer: Self-pay | Admitting: Family Medicine

## 2024-06-05 MED ORDER — ATORVASTATIN CALCIUM 10 MG PO TABS: 10.0000 mg | ORAL_TABLET | Freq: Every day | ORAL | 1 refills | Status: AC

## 2024-06-05 NOTE — Progress Notes (Signed)
 Atorvastatin  10mg  90 day with 1 refill has been sent in

## 2024-06-05 NOTE — Telephone Encounter (Signed)
 Patient is curious if she should repeat blood work as she was not fasting. Please advise.

## 2024-06-07 NOTE — Telephone Encounter (Signed)
Patient responded.

## 2024-06-11 ENCOUNTER — Ambulatory Visit: Admitting: Pulmonary Disease

## 2024-06-11 VITALS — BP 133/75 | HR 66

## 2024-06-11 DIAGNOSIS — Z87891 Personal history of nicotine dependence: Secondary | ICD-10-CM | POA: Diagnosis not present

## 2024-06-11 DIAGNOSIS — G4733 Obstructive sleep apnea (adult) (pediatric): Secondary | ICD-10-CM | POA: Diagnosis not present

## 2024-06-11 DIAGNOSIS — R0609 Other forms of dyspnea: Secondary | ICD-10-CM | POA: Diagnosis not present

## 2024-06-11 DIAGNOSIS — R0602 Shortness of breath: Secondary | ICD-10-CM

## 2024-06-11 NOTE — Progress Notes (Signed)
 [name] [name]    [id number]    1951-07-27  Primary Care Physician:Tabori, Comer BRAVO, MD  Referring Physician: Mahlon Comer BRAVO, MD [phone]  [place] [address]  Chief complaint:   Mild obstructive sleep apnea Patient has had ongoing shortness of breath  HPI:  Patient with mild obstructive sleep apnea recently started using an oral device Has continued using an oral device Feels her sleep quality is better Less snoring Wakes up feeling rested  Has had worsening shortness of breath Was recently seen and had a follow-up with cardiology - Echocardiogram was within normal limits - I did review previous pulmonary function test with her as well performed in November 2023 showing normal PFT  She had a PET scan done in May 2025 that did not reveal concerning findings She has a follow-up CT scan scheduled for about a week from now to follow-up on the lung nodules  She is short of breath with walking up steps, about 300 yards to make her short of breath  Denies any chest pains or chest discomfort No ongoing cough  No history of asthma She does not have any wheezing, no chronic cough  Smoked in the past but quit in 1976 only smokes about 6 years  Recently been treated for iron  deficiency, hematocrit is within normal limits currently  Prior to being evaluated by cardiology, did have some chest discomfort which has not been present over the last couple of months.  Continues to have significant shortness of breath  Outpatient Encounter Medications as of 06/11/2024  Medication Sig   amLODipine  (NORVASC ) 2.5 MG tablet Take 1 tablet (2.5 mg total) by mouth daily.   aspirin 325 MG tablet Take 325 mg by mouth daily.   atorvastatin  (LIPITOR) 10 MG tablet Take 1 tablet (10 mg total) by mouth daily.   brimonidine (ALPHAGAN) 0.2 % ophthalmic solution 1 drop 3 (three) times daily.   cetirizine (ZYRTEC) 10 MG tablet Take 10 mg by mouth daily.   Cholecalciferol   (VITAMIN D3) 5000 UNITS CAPS Take 5,000 Units by mouth daily.    Coenzyme Q10 (COQ10) 200 MG CAPS Take 1 capsule by mouth daily.    COMIRNATY syringe    doxycycline  (ADOXA) 50 MG tablet Take 50 mg by mouth daily. For rosacea   FLUoxetine  (PROZAC ) 10 MG capsule Take 1 capsule (10 mg total) by mouth daily.   iron  polysaccharides (NIFEREX) 150 MG capsule Take 1 capsule (150 mg total) by mouth daily.   losartan  (COZAAR ) 50 MG tablet TAKE 1 TABLET EVERY DAY   Magnesium  200 MG TABS Take 200 mg by mouth in the morning and at bedtime.    Multiple Vitamins-Minerals (MULTIVITAMIN PO) Take 1 tablet by mouth daily.   NP THYROID  60 MG tablet TAKE 1 TABLET(60 MG) BY MOUTH DAILY BEFORE BREAKFAST   omeprazole  (PRILOSEC) 20 MG capsule TAKE 1 CAPSULE EVERY DAY   SYSTANE BALANCE 0.6 % SOLN SMARTSIG:1 Drop(s) In Eye(s) PRN   Travoprost, BAK Free, (TRAVATAN) 0.004 % SOLN ophthalmic solution Place 1 drop into both eyes at bedtime.    tretinoin (RETIN-A) 0.05 % cream Apply topically at bedtime.   Facility-Administered Encounter Medications as of 06/11/2024  Medication   0.9 %  sodium chloride  infusion    Allergies as of 06/11/2024 - Review Complete 06/11/2024  Allergen Reaction Noted   Clindamycin/lincomycin Diarrhea 06/20/2013   Flagyl [metronidazole hcl] Diarrhea 10/15/2011   Augmentin  [amoxicillin -pot clavulanate] Diarrhea 06/20/2013  Telithromycin Diarrhea     Past Medical History:  Diagnosis Date   Allergy 12/19/2007   Bronchitis    Cataract    Coronary artery calcification seen on CAT scan 04/09/2021   DJD (degenerative joint disease)    DOE (dyspnea on exertion) 04/09/2021   GERD (gastroesophageal reflux disease) 12/19/2007   Glaucoma    History of adenomatous colonic polyps 01/17/2017   Hypercholesterolemia    Hypothyroidism    IBS (irritable bowel syndrome)    Iron  deficiency anemia    Malignant melanoma of neck    Mild cognitive impairment 07/14/2022   Mixed hyperlipidemia 04/09/2021    OSA (obstructive sleep apnea) 12/27/2017   mouthguard; no CPAP   Osteoporosis    Plantar fasciitis    Postoperative nausea and vomiting 09/02/2015   Precordial pain 04/09/2021   Presence of shoulder implant 01/21/2021   Primary hypertension 12/19/2007   Primary localized osteoarthritis of left knee    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/21/2021   Tremor of both hands 05/21/2020   Vitamin D  deficiency 09/21/2009    Past Surgical History:  Procedure Laterality Date   ANAL RECTAL MANOMETRY N/A 11/16/2019   Procedure: ANO RECTAL MANOMETRY;  Surgeon: Shila Gustav GAILS, MD;  Location: [facility];  Service: Endoscopy;  Laterality: N/A;   BUNIONECTOMY  2004   left   CATARACT EXTRACTION W/ INTRAOCULAR LENS  IMPLANT, BILATERAL     COLONOSCOPY     EPIDURAL CATHETER INSERTION     L7   FOOT OSTEOTOMY  2003   right   HERNIA REPAIR  1957   JOINT REPLACEMENT     KNEE ARTHROSCOPY  1987&2003   left   MELANOMA EXCISION Right    shoulder   POLYPECTOMY     REVERSE SHOULDER ARTHROPLASTY Right 01/21/2021   Procedure: REVERSE SHOULDER ARTHROPLASTY;  Surgeon: Cristy Bonner DASEN, MD;  Location: [facility];  Service: Orthopedics;  Laterality: Right;   SHOULDER ARTHROSCOPY WITH OPEN ROTATOR CUFF REPAIR  2013   right   SKIN SURGERY  01/2004   Melanoma surg left shoulder   TOENAIL EXCISION  10/04/2017   TOTAL KNEE ARTHROPLASTY Left 09/01/2015   Procedure: TOTAL KNEE ARTHROPLASTY;  Surgeon: Lamar Millman, MD;  Location: [facility];  Service: Orthopedics;  Laterality: Left;   TUBAL LIGATION  1990    Family History  Problem Relation Age of Onset   Tremor Mother        no PD   Esophageal cancer Father    Colon polyps Father    Cancer Father        laryngeal   Heart disease Father    Osteoporosis Sister    Hypertension Brother    Heart disease Maternal Grandfather    Heart disease Paternal Grandfather    Diabetes Son    Tremor Maternal Aunt    Alzheimer's disease  Maternal Aunt    Colon cancer Neg Hx    Stomach cancer Neg Hx    Crohn's disease Neg Hx    Rectal cancer Neg Hx     Social History   Socioeconomic History   Marital status: Married    Spouse name: Cathlyn [phone]   Number of children: 2   Years of education: 12   Highest education level: High school graduate  Occupational History   Occupation: retired  Tobacco Use   Smoking status: Former    Current packs/day: 0.00    Types: Cigarettes    Start date: 10/04/1969    Quit  date: 10/05/1975    Years since quitting: 48.7    Passive exposure: Past   Smokeless tobacco: Never  Vaping Use   Vaping status: Never Used  Substance and Sexual Activity   Alcohol use: Yes    Alcohol/week: 7.0 standard drinks of alcohol    Types: 7 Standard drinks or equivalent per week    Comment: 1 beverage nightly   Drug use: No   Sexual activity: Yes  Other Topics Concern   Not on file  Social History Narrative   Right handed   Drinks caffeine, tea   Retired   Lives with her husband   Social Drivers of Corporate investment banker Strain: Low Risk  (01/10/2024)   Overall Financial Resource Strain (CARDIA)    Difficulty of Paying Living Expenses: Not hard at all  Food Insecurity: No Food Insecurity (01/10/2024)   Hunger Vital Sign    Worried About Running Out of Food in the Last Year: Never true    Ran Out of Food in the Last Year: Never true  Transportation Needs: No Transportation Needs (01/10/2024)   PRAPARE - Administrator, Civil Service (Medical): No    Lack of Transportation (Non-Medical): No  Physical Activity: Insufficiently Active (01/10/2024)   Exercise Vital Sign    Days of Exercise per Week: 3 days    Minutes of Exercise per Session: 20 min  Stress: No Stress Concern Present (01/10/2024)   [employer] of Occupational Health - Occupational Stress Questionnaire    Feeling of Stress : Not at all  Social Connections: Socially Integrated (01/10/2024)   Social Connection and  Isolation Panel    Frequency of Communication with Friends and Family: More than three times a week    Frequency of Social Gatherings with Friends and Family: Three times a week    Attends Religious Services: 1 to 4 times per year    Active Member of Clubs or Organizations: Yes    Attends Banker Meetings: More than 4 times per year    Marital Status: Married  Catering manager Violence: Not At Risk (01/10/2024)   Humiliation, Afraid, Rape, and Kick questionnaire    Fear of Current or Ex-Partner: No    Emotionally Abused: No    Physically Abused: No    Sexually Abused: No    Review of Systems  Respiratory:  Positive for apnea and shortness of breath.   Psychiatric/Behavioral:  Positive for sleep disturbance.     Vitals:   06/11/24 0948  BP: 133/75  Pulse: 66  SpO2: 99%     Physical Exam Constitutional:      Appearance: She is obese.  HENT:     Head: Normocephalic.     Mouth/Throat:     Mouth: Mucous membranes are moist.  Eyes:     General: No scleral icterus. Cardiovascular:     Rate and Rhythm: Normal rate and regular rhythm.     Heart sounds: No murmur heard.    No friction rub.  Pulmonary:     Effort: No respiratory distress.     Breath sounds: No stridor. No wheezing or rhonchi.  Musculoskeletal:     Cervical back: No rigidity.  Neurological:     Mental Status: She is alert.  Psychiatric:        Mood and Affect: Mood normal.    Data Reviewed: Sleep study results showing mild obstructive sleep apnea-AHI of 11.8 mild obstructive sleep apnea  PFT with no obstruction, no restriction and  normal diffusing capacity  PET scan 02/29/2024 reviewed by myself No infiltrative lung disease noted  Recent PET stress noted  Echocardiogram 03/22/2024 - Ejection fraction of 55 to 60%, normal systolic function, normal pulmonary artery systolic pressures  Records of previous visit with Dr. Alaine reviewed  Recent visits with cardiology  reviewed  Assessment:  Mild obstructive sleep apnea -Continue using oral device  Shortness of breath on exertion - Has no diagnosed underlying lung disease - Previous PFT within normal limits Recent echocardiogram within normal limits  Hypertension  Diabetes  History of malignant melanoma status post excision  History of hypothyroidism  Iron  deficiency anemia - Most recent hematocrit within normal limits   Plan/Recommendations: Will follow-up on the CT scan that is already scheduled  Schedule for repeat pulmonary function test  Importance of graded exercises, resistance training was discussed and encouraged  If studies are negative, then a cardiopulmonary exercise study may be needed  Will schedule for follow-up in about 4 weeks  Encouraged to call with significant concerns  There is no role for inhalers at present as she has no underlying lung disease known to her Symptoms not compatible with thromboembolic disease   Jennet Epley MD [facility] 06/11/2024, 9:58 AM  CC: Tabori, Katherine E, MD

## 2024-06-11 NOTE — Patient Instructions (Addendum)
 Shortness of breath  We will get a breathing study to compare with your previous one  Will follow-up with your CT scan  Your recent echocardiogram was within normal limits  Graded exercises as tolerated is what I will recommend at present  If all the testing is negative then a cardiopulmonary exercise testing will be the next workup  I will see you back in about 4 weeks

## 2024-06-13 DIAGNOSIS — L578 Other skin changes due to chronic exposure to nonionizing radiation: Secondary | ICD-10-CM | POA: Diagnosis not present

## 2024-06-13 DIAGNOSIS — L570 Actinic keratosis: Secondary | ICD-10-CM | POA: Diagnosis not present

## 2024-06-13 DIAGNOSIS — Z129 Encounter for screening for malignant neoplasm, site unspecified: Secondary | ICD-10-CM | POA: Diagnosis not present

## 2024-06-13 DIAGNOSIS — G548 Other nerve root and plexus disorders: Secondary | ICD-10-CM | POA: Diagnosis not present

## 2024-06-13 DIAGNOSIS — Z08 Encounter for follow-up examination after completed treatment for malignant neoplasm: Secondary | ICD-10-CM | POA: Diagnosis not present

## 2024-06-13 DIAGNOSIS — C434 Malignant melanoma of scalp and neck: Secondary | ICD-10-CM | POA: Diagnosis not present

## 2024-06-13 DIAGNOSIS — Z85820 Personal history of malignant melanoma of skin: Secondary | ICD-10-CM | POA: Diagnosis not present

## 2024-06-13 DIAGNOSIS — W908XXS Exposure to other nonionizing radiation, sequela: Secondary | ICD-10-CM | POA: Diagnosis not present

## 2024-06-13 DIAGNOSIS — Z85828 Personal history of other malignant neoplasm of skin: Secondary | ICD-10-CM | POA: Diagnosis not present

## 2024-06-15 ENCOUNTER — Telehealth: Admitting: Nurse Practitioner

## 2024-06-15 ENCOUNTER — Encounter: Payer: Self-pay | Admitting: Family Medicine

## 2024-06-15 ENCOUNTER — Ambulatory Visit: Payer: Self-pay

## 2024-06-15 DIAGNOSIS — R0609 Other forms of dyspnea: Secondary | ICD-10-CM | POA: Diagnosis not present

## 2024-06-15 MED ORDER — ALBUTEROL SULFATE HFA 108 (90 BASE) MCG/ACT IN AERS: 2.0000 | INHALATION_SPRAY | Freq: Four times a day (QID) | RESPIRATORY_TRACT | 0 refills | Status: DC | PRN

## 2024-06-15 NOTE — Assessment & Plan Note (Signed)
 Dyspnea Chronic dyspnea with normal cardiac and pulmonary evaluations. Initially associated with anemia which has now resolved - Prescribed albuterol  inhaler, 1-2 puffs every 6 hours as needed. - Follow up with pulmonologist for pulmonary function testing in October. - Discussed potential use of inhaled steroid with primary care physician if albuterol  is ineffective.

## 2024-06-15 NOTE — Progress Notes (Signed)
   Established Patient Office Visit  An audio/visual tele-health visit was completed today for this patient. I connected with  [name] [name] on 06/15/24 utilizing audio/visual technology and verified that I am speaking with the correct person using two identifiers. The patient was located at their home, and I was located at the office of [facility] at [facility] during the encounter. I discussed the limitations of evaluation and management by telemedicine. The patient expressed understanding and agreed to proceed.     Subjective   Patient ID: [name] [name], female    DOB: [birth year]  Age: 73 y.o. MRN: [id number]  Chief Complaint  Patient presents with   Shortness of Breath    It been going since April,   Discussed the use of AI scribe software for clinical note transcription with the patient, who gave verbal consent to proceed.  History of Present Illness [name] [name] is a 73 year old female with anemia who presents with shortness of breath.  Dyspnea - Shortness of breath present for five months, beginning April 2025 - Initial onset associated with anemia - Recent severe episode of dyspnea after walking, requiring rest before returning home - During severe episode, unable to talk or perform activities - No chest pain, wheezing, or chest tightness during episodes - No current cough, wheezing, or chest tightness - No history of chest pain during breathlessness  Anemia - Hemoglobin improved to 12 by late August 2025 with oral iron  supplementation  Pulmonary and cardiac evaluation - Normal pulmonary function tests in 2023. Planning to follow-up with pulmonologist for repeat testing next month - CT scan showed pulmonary nodules - Normal cardiac evaluations in 2018, 2022, and 2025 (nuclear stress test and echocardiogram)  Tobacco use - Quit smoking in 1977 after seven years of use       ROS: see HPI    Objective:     There were no vitals taken for this  visit.   Physical Exam Comprehensive physical exam not completed today as office visit was conducted remotely.  Patient appears well over vide, no evidence of acute distress.  Patient was alert and oriented, and appeared to have appropriate judgment.   No results found for any visits on 06/15/24.    The 10-year ASCVD risk score (Arnett DK, et al., 2019) is: 16.3%    Assessment & Plan:   Problem List Items Addressed This Visit       Other   DOE (dyspnea on exertion) - Primary   Dyspnea Chronic dyspnea with normal cardiac and pulmonary evaluations. Initially associated with anemia which has now resolved - Prescribed albuterol  inhaler, 1-2 puffs every 6 hours as needed. - Follow up with pulmonologist for pulmonary function testing in October. - Discussed potential use of inhaled steroid with primary care physician if albuterol  is ineffective.      Relevant Medications   albuterol  (VENTOLIN  HFA) 108 (90 Base) MCG/ACT inhaler   Assessment and Plan Assessment & Plan Dyspnea Chronic dyspnea with normal cardiac and pulmonary evaluations. Initially associated with anemia which has now resolved - Prescribed albuterol  inhaler, 1-2 puffs every 6 hours as needed. - Follow up with pulmonologist for pulmonary function testing in October. - Discussed potential use of inhaled steroid with primary care physician if albuterol  is ineffective.   Return if symptoms worsen or fail to improve.    [name] FORBES Pereyra, NP

## 2024-06-15 NOTE — Telephone Encounter (Signed)
 [place] the [place] and [place]B. She is requesting inhaler. Okay to schedule a virtual visit?

## 2024-06-15 NOTE — Telephone Encounter (Signed)
 Patient scheduled a virtual visit with another provider. FYI

## 2024-06-15 NOTE — Telephone Encounter (Signed)
 FYI Only or Action Required?: FYI only for provider.  Patient was last seen in primary care on 06/01/2024 by Mahlon Comer BRAVO, MD.  Called Nurse Triage reporting Breathing Problem.  Symptoms began several months ago.  Interventions attempted: Rest, hydration, or home remedies.  Symptoms are: unchanged.  Triage Disposition: See HCP Within 4 Hours (Or PCP Triage)  Patient/caregiver understands and will follow disposition?: Yes      Copied from CRM #[id number]. Topic: Clinical - Red Word Triage >> Jun 15, 2024  2:49 PM Suzen RAMAN wrote: Red Word that prompted transfer to Nurse Triage: SOB, Requesting a sooner virtual visit with any provider. Virtual visit previous approved by provider CMA Reason for Disposition  [1] MILD difficulty breathing (e.g., minimal/no SOB at rest, SOB with walking, pulse < 100) AND [2] NEW-onset or WORSE than normal  Answer Assessment - Initial Assessment Questions 1. RESPIRATORY STATUS: Describe your breathing? (e.g., wheezing, shortness of breath, unable to speak, severe coughing)      SOB 2. ONSET: When did this breathing problem begin?      6 mos, 1 upsetting episode yesterday when walking (endorses could not catch breath, denies color changes and self resolved) 3. PATTERN Does the difficult breathing come and go, or has it been constant since it started?      Comes and goes 4. SEVERITY: How bad is your breathing? (e.g., mild, moderate, severe)      Mild with exertion Triager does not appreciate audible SOB/wheezing during call. Pt is speaking in full sentences.  5. RECURRENT SYMPTOM: Have you had difficulty breathing before? If Yes, ask: When was the last time? and What happened that time?      First time 6. CARDIAC HISTORY: Do you have any history of heart disease? (e.g., heart attack, angina, bypass surgery, angioplasty)      Denies Endorses hemoglobin off the chart 7. LUNG HISTORY: Do you have any history of lung disease?   (e.g., pulmonary embolus, asthma, emphysema)     denies 8. CAUSE: What do you think is causing the breathing problem?      unknown 9. OTHER SYMPTOMS: Do you have any other symptoms? (e.g., chest pain, cough, dizziness, fever, runny nose)     denies 10. O2 SATURATION MONITOR:  Do you use an oxygen saturation monitor (pulse oximeter) at home? If Yes, ask: What is your reading (oxygen level) today? What is your usual oxygen saturation reading? (e.g., 95%)       N/a 11. PREGNANCY: Is there any chance you are pregnant? When was your last menstrual period?       N/a 12. TRAVEL: Have you traveled out of the country in the last month? (e.g., travel history, exposures)       N/a    Pt currently out of town at [employer] and is requesting virtual visit to get INH.   Scheduled virtual with alternate PC provider.  Protocols used: Breathing Difficulty-A-AH

## 2024-06-20 DIAGNOSIS — C434 Malignant melanoma of scalp and neck: Secondary | ICD-10-CM | POA: Diagnosis not present

## 2024-06-20 DIAGNOSIS — R918 Other nonspecific abnormal finding of lung field: Secondary | ICD-10-CM | POA: Diagnosis not present

## 2024-07-01 ENCOUNTER — Other Ambulatory Visit: Payer: Self-pay | Admitting: Family Medicine

## 2024-07-13 ENCOUNTER — Ambulatory Visit: Payer: Self-pay | Admitting: Family Medicine

## 2024-07-13 ENCOUNTER — Encounter: Payer: Self-pay | Admitting: Family Medicine

## 2024-07-13 ENCOUNTER — Encounter: Payer: Self-pay | Admitting: Pulmonary Disease

## 2024-07-13 ENCOUNTER — Ambulatory Visit: Admitting: Family Medicine

## 2024-07-13 VITALS — BP 124/74 | HR 81 | Temp 97.8°F | Ht 65.0 in | Wt 196.4 lb

## 2024-07-13 DIAGNOSIS — D508 Other iron deficiency anemias: Secondary | ICD-10-CM | POA: Diagnosis not present

## 2024-07-13 DIAGNOSIS — Z23 Encounter for immunization: Secondary | ICD-10-CM | POA: Diagnosis not present

## 2024-07-13 DIAGNOSIS — F419 Anxiety disorder, unspecified: Secondary | ICD-10-CM

## 2024-07-13 LAB — CBC WITH DIFFERENTIAL/PLATELET
Basophils Absolute: 0 K/uL (ref 0.0–0.1)
Basophils Relative: 0.8 % (ref 0.0–3.0)
Eosinophils Absolute: 0.1 K/uL (ref 0.0–0.7)
Eosinophils Relative: 1.6 % (ref 0.0–5.0)
HCT: 39.8 % (ref 36.0–46.0)
Hemoglobin: 12.6 g/dL (ref 12.0–15.0)
Lymphocytes Relative: 30.3 % (ref 12.0–46.0)
Lymphs Abs: 1.6 K/uL (ref 0.7–4.0)
MCHC: 31.6 g/dL (ref 30.0–36.0)
MCV: 77.1 fl — ABNORMAL LOW (ref 78.0–100.0)
Monocytes Absolute: 0.4 K/uL (ref 0.1–1.0)
Monocytes Relative: 7.1 % (ref 3.0–12.0)
Neutro Abs: 3.2 K/uL (ref 1.4–7.7)
Neutrophils Relative %: 60.2 % (ref 43.0–77.0)
Platelets: 268 K/uL (ref 150.0–400.0)
RBC: 5.17 Mil/uL — ABNORMAL HIGH (ref 3.87–5.11)
RDW: 17.3 % — ABNORMAL HIGH (ref 11.5–15.5)
WBC: 5.3 K/uL (ref 4.0–10.5)

## 2024-07-13 MED ORDER — POLYSACCHARIDE IRON COMPLEX 150 MG PO CAPS: 150.0000 mg | ORAL_CAPSULE | Freq: Every day | ORAL | 1 refills | Status: AC

## 2024-07-13 NOTE — Telephone Encounter (Signed)
 Dr. Neda, Please see patient message regarding breathing and exercising and advise.  Thank you.

## 2024-07-13 NOTE — Patient Instructions (Signed)
 Schedule your complete physical for March We'll notify you of your lab results and make any changes if needed Message pulmonary and let them know you are having a hard time increasing your distance No med changes at this time Call with any questions or concerns Stay Safe!  Stay Healthy! Happy Fall!!

## 2024-07-13 NOTE — Progress Notes (Signed)
   Subjective:    Patient ID: [name] [name], female    DOB: 02-Apr-1951, 73 y.o.   MRN: [id number]  HPI Anxiety- at last visit we started Fluoxetine  10mg  daily.  Pt reports things are not improving but she is 'coping'.  Pt is not interested in increasing dose at this time.  Continues to have issues w/ SOB- has appt w/ pulmonary upcoming.  Anemia- pt reports she is again 'chomping' ice.  Last Hgb 12.  Iron  panel in July showed normal iron  levels but low ferritin levels.  Still taking daily iron  supplement.     Review of Systems For ROS see HPI     Objective:   Physical Exam Vitals reviewed.  Constitutional:      General: She is not in acute distress.    Appearance: Normal appearance. She is well-developed. She is not ill-appearing.  HENT:     Head: Normocephalic and atraumatic.  Eyes:     Conjunctiva/sclera: Conjunctivae normal.     Pupils: Pupils are equal, round, and reactive to light.  Neck:     Thyroid : No thyromegaly.  Cardiovascular:     Rate and Rhythm: Normal rate and regular rhythm.     Pulses: Normal pulses.     Heart sounds: Normal heart sounds. No murmur heard. Pulmonary:     Effort: Pulmonary effort is normal. No respiratory distress.     Breath sounds: Normal breath sounds.  Abdominal:     General: There is no distension.     Palpations: Abdomen is soft.     Tenderness: There is no abdominal tenderness.  Musculoskeletal:     Cervical back: Normal range of motion and neck supple.     Right lower leg: No edema.     Left lower leg: No edema.  Lymphadenopathy:     Cervical: No cervical adenopathy.  Skin:    General: Skin is warm and dry.  Neurological:     General: No focal deficit present.     Mental Status: She is alert and oriented to person, place, and time.  Psychiatric:        Mood and Affect: Mood normal.        Behavior: Behavior normal.        Thought Content: Thought content normal.           Assessment & Plan:

## 2024-07-14 LAB — IRON,TIBC AND FERRITIN PANEL
%SAT: 16 % (ref 16–45)
Ferritin: 10 ng/mL — ABNORMAL LOW (ref 16–288)
Iron: 66 ug/dL (ref 45–160)
TIBC: 418 ug/dL (ref 250–450)

## 2024-07-15 NOTE — Assessment & Plan Note (Signed)
 Pt reports doing well on the Fluoxetine  10mg .  She is not interested in increasing dose at this time.  Will continue to monitor.

## 2024-07-15 NOTE — Assessment & Plan Note (Signed)
 Pt has hx of anemia.  Last Hgb was better at 12 but she reports she is again eating ice.  Will repeat CBC and iron  panel.

## 2024-07-16 NOTE — Progress Notes (Signed)
 Pt has reviewed via MyChart

## 2024-07-23 ENCOUNTER — Encounter

## 2024-08-01 ENCOUNTER — Encounter

## 2024-08-01 ENCOUNTER — Ambulatory Visit: Admitting: Primary Care

## 2024-08-01 ENCOUNTER — Ambulatory Visit (INDEPENDENT_AMBULATORY_CARE_PROVIDER_SITE_OTHER)

## 2024-08-01 DIAGNOSIS — R0602 Shortness of breath: Secondary | ICD-10-CM | POA: Diagnosis not present

## 2024-08-01 LAB — PULMONARY FUNCTION TEST
DL/VA % pred: 73 %
DL/VA: 2.98 ml/min/mmHg/L
DLCO cor % pred: 80 %
DLCO cor: 16.26 ml/min/mmHg
DLCO unc % pred: 77 %
DLCO unc: 15.85 ml/min/mmHg
FEF 25-75 Post: 2.14 L/s
FEF 25-75 Pre: 1.46 L/s
FEF2575-%Change-Post: 47 %
FEF2575-%Pred-Post: 113 %
FEF2575-%Pred-Pre: 76 %
FEV1-%Change-Post: 11 %
FEV1-%Pred-Post: 108 %
FEV1-%Pred-Pre: 96 %
FEV1-Post: 2.54 L
FEV1-Pre: 2.27 L
FEV1FVC-%Change-Post: 8 %
FEV1FVC-%Pred-Pre: 92 %
FEV6-%Change-Post: 3 %
FEV6-%Pred-Post: 111 %
FEV6-%Pred-Pre: 107 %
FEV6-Post: 3.33 L
FEV6-Pre: 3.2 L
FEV6FVC-%Change-Post: 0 %
FEV6FVC-%Pred-Post: 103 %
FEV6FVC-%Pred-Pre: 102 %
FVC-%Change-Post: 3 %
FVC-%Pred-Post: 108 %
FVC-%Pred-Pre: 104 %
FVC-Post: 3.37 L
FVC-Pre: 3.26 L
Post FEV1/FVC ratio: 75 %
Post FEV6/FVC ratio: 99 %
Pre FEV1/FVC ratio: 70 %
Pre FEV6/FVC Ratio: 98 %
RV % pred: 137 %
RV: 3.17 L
TLC % pred: 126 %
TLC: 6.66 L

## 2024-08-01 NOTE — Progress Notes (Signed)
 Full pft performed today

## 2024-08-01 NOTE — Patient Instructions (Signed)
 Full pft performed today

## 2024-08-02 ENCOUNTER — Encounter: Payer: Self-pay | Admitting: Primary Care

## 2024-08-02 ENCOUNTER — Ambulatory Visit: Admitting: Primary Care

## 2024-08-02 VITALS — BP 130/72 | HR 96 | Temp 97.8°F | Ht 65.0 in | Wt 199.2 lb

## 2024-08-02 DIAGNOSIS — G4733 Obstructive sleep apnea (adult) (pediatric): Secondary | ICD-10-CM

## 2024-08-02 MED ORDER — SPIRIVA RESPIMAT 2.5 MCG/ACT IN AERS: 2.0000 | INHALATION_SPRAY | Freq: Every day | RESPIRATORY_TRACT | Status: AC

## 2024-08-02 NOTE — Progress Notes (Signed)
 @Patient  ID: [name] [name], female    DOB: [birth year], 73 y.o.   MRN: [id number]  Chief Complaint  Patient presents with   Obstructive Sleep Apnea    Uses oral appliance. No complaints and uses it nightly     Referring provider: Mahlon Comer BRAVO, MD  HPI: 73 year old female, former smoker quit in 1970s (significant second hand exposure). PMH significant for mild OSA, iron  deficiency anemia    Previous [facility] pulmoanry encounter 06/11/24- Dr. Neda  Patient with mild obstructive sleep apnea recently started using an oral device Has continued using an oral device Feels her sleep quality is better Less snoring Wakes up feeling rested  Has had worsening shortness of breath Was recently seen and had a follow-up with cardiology - Echocardiogram was within normal limits - I did review previous pulmonary function test with her as well performed in November 2023 showing normal PFT  She had a PET scan done in May 2025 that did not reveal concerning findings She has a follow-up CT scan scheduled for about a week from now to follow-up on the lung nodules  She is short of breath with walking up steps, about 300 yards to make her short of breath  Denies any chest pains or chest discomfort No ongoing cough  No history of asthma She does not have any wheezing, no chronic cough  Smoked in the past but quit in 1976 only smokes about 6 years  Recently been treated for iron  deficiency, hematocrit is within normal limits currently  Prior to being evaluated by cardiology, did have some chest discomfort which has not been present over the last couple of months.  Continues to have significant shortness of breath  08/02/2024- Interim hx  Discussed the use of AI scribe software for clinical note transcription with the patient, who gave verbal consent to proceed.  History of Present Illness [name] [name] is a 73 year old female with COPD who presents with shortness of breath on  exertion.  She experiences shortness of breath primarily during exertion, such as climbing stairs, lifting, or vacuuming, and describes feeling 'wiped out' during these activities. She uses a rescue inhaler, albuterol , three times when gasping for breath, which provides relief. No wheezing, chest tightness, or chronic cough. A recent breathing test was performed.  She has a history of smoking, having quit in 1976, and was exposed to secondhand smoke from both parents who smoked heavily. Her mother died of COPD.  She has a history of iron  deficiency anemia. She is currently taking daily oral iron  supplements. Her ferritin levels remain low despite treatment, although her hemoglobin and hematocrit levels are normal.  She has a history of sleep apnea, diagnosed in November 2023, with 11.8 apneic events per hour. She has been using an oral appliance for a year, which has improved her sleep quality and reduced snoring. A recent study indicated her apnea score is down to three events per hour, indicating well-controlled sleep apnea.  She has a history of pulmonary nodules discovered during a PET CT scan in May 2025, with follow-up CT scan in September 2025 showing no change in size of pulmonary nodules. The nodules are small, measuring between four to five millimeters, and are not considered suspicious.   PFTs showed minimal obstructive lung disease consistent with emphysema.   Allergies  Allergen Reactions   Clindamycin/Lincomycin Diarrhea   Flagyl [Metronidazole Hcl] Diarrhea    Stomach upset. Pt states she CANNOT TAKE oral, has no problem with  topical.    Augmentin  [Amoxicillin -Pot Clavulanate] Diarrhea   Telithromycin Diarrhea    Immunization History  Administered Date(s) Administered   Fluad Quad(high Dose 65+) 06/04/2019, 07/02/2021, 07/16/2021, 06/17/2022   Fluad Trivalent(High Dose 65+) 06/01/2023   INFLUENZA, HIGH DOSE SEASONAL PF 06/12/2018, 06/03/2020, 07/13/2024   Influenza Split  08/04/2012   Influenza, Seasonal, Injecte, Preservative Fre 06/04/2019   Influenza,inj,Quad PF,6+ Mos 11/22/2014, 07/01/2015, 06/04/2016, 07/25/2017   Influenza-Unspecified 07/03/2021   PFIZER(Purple Top)SARS-COV-2 Vaccination 10/26/2019, 11/13/2019, 06/19/2020, 02/11/2021   PNEUMOCOCCAL CONJUGATE-20 05/20/2021   Pfizer Covid-19 Vaccine Bivalent Booster 66yrs & up 07/16/2021   Pfizer(Comirnaty)Fall Seasonal Vaccine 12 years and older 08/01/2024   Pneumococcal Conjugate-13 05/17/2018   Tdap 10/01/2011, 05/20/2021   Zoster Recombinant(Shingrix) 07/25/2017, 12/22/2017   Zoster, Live 12/03/2015    Past Medical History:  Diagnosis Date   Allergy 12/19/2007   Bronchitis    Cataract    Coronary artery calcification seen on CAT scan 04/09/2021   DJD (degenerative joint disease)    DOE (dyspnea on exertion) 04/09/2021   GERD (gastroesophageal reflux disease) 12/19/2007   Glaucoma    History of adenomatous colonic polyps 01/17/2017   Hypercholesterolemia    Hypothyroidism    IBS (irritable bowel syndrome)    Iron  deficiency anemia    Malignant melanoma of neck    Mild cognitive impairment 07/14/2022   Mixed hyperlipidemia 04/09/2021   OSA (obstructive sleep apnea) 12/27/2017   mouthguard; no CPAP   Osteoporosis    Plantar fasciitis    Postoperative nausea and vomiting 09/02/2015   Precordial pain 04/09/2021   Presence of shoulder implant 01/21/2021   Primary hypertension 12/19/2007   Primary localized osteoarthritis of left knee    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/21/2021   Tremor of both hands 05/21/2020   Vitamin D  deficiency 09/21/2009    Tobacco History: Social History   Tobacco Use  Smoking Status Former   Current packs/day: 0.00   Types: Cigarettes   Start date: 10/04/1969   Quit date: 10/05/1975   Years since quitting: 48.8   Passive exposure: Past  Smokeless Tobacco Never   Counseling given: Not Answered   Outpatient Medications Prior to Visit   Medication Sig Dispense Refill   albuterol  (VENTOLIN  HFA) 108 (90 Base) MCG/ACT inhaler Inhale 2 puffs into the lungs every 6 (six) hours as needed for wheezing or shortness of breath. 8 g 0   amLODipine  (NORVASC ) 2.5 MG tablet Take 1 tablet (2.5 mg total) by mouth daily. 90 tablet 3   aspirin 325 MG tablet Take 325 mg by mouth daily.     atorvastatin  (LIPITOR) 10 MG tablet Take 1 tablet (10 mg total) by mouth daily. 90 tablet 1   brimonidine (ALPHAGAN) 0.2 % ophthalmic solution 1 drop 3 (three) times daily.     cetirizine (ZYRTEC) 10 MG tablet Take 10 mg by mouth daily.     Cholecalciferol  (VITAMIN D3) 5000 UNITS CAPS Take 5,000 Units by mouth daily.      Coenzyme Q10 (COQ10) 200 MG CAPS Take 1 capsule by mouth daily.      doxycycline  (ADOXA) 50 MG tablet Take 50 mg by mouth daily. For rosacea     doxycycline  (VIBRAMYCIN ) 50 MG capsule Take 50 mg by mouth daily.     FLUoxetine  (PROZAC ) 10 MG capsule Take 1 capsule (10 mg total) by mouth daily. 30 capsule 3   iron  polysaccharides (NIFEREX) 150 MG capsule Take 1 capsule (150 mg total) by mouth daily. 90 capsule 1   losartan  (  COZAAR ) 50 MG tablet TAKE 1 TABLET EVERY DAY 90 tablet 3   Magnesium  200 MG TABS Take 200 mg by mouth in the morning and at bedtime.      Multiple Vitamins-Minerals (MULTIVITAMIN PO) Take 1 tablet by mouth daily.     Niacinamide-Zn-Cu-Methfo-Se-Cr (NICOTINAMIDE PO) Take 500 mg by mouth in the morning and at bedtime.     NP THYROID  60 MG tablet TAKE 1 TABLET(60 MG) BY MOUTH DAILY BEFORE BREAKFAST 90 tablet 1   omeprazole  (PRILOSEC) 20 MG capsule TAKE 1 CAPSULE EVERY DAY 90 capsule 3   SYSTANE BALANCE 0.6 % SOLN SMARTSIG:1 Drop(s) In Eye(s) PRN     Travoprost, BAK Free, (TRAVATAN) 0.004 % SOLN ophthalmic solution Place 1 drop into both eyes at bedtime.      tretinoin (RETIN-A) 0.05 % cream Apply topically at bedtime.     triamcinolone  cream (KENALOG) 0.1 % Apply topically as needed.     Facility-Administered Medications  Prior to Visit  Medication Dose Route Frequency Provider Last Rate Last Admin   0.9 %  sodium chloride  infusion  500 mL Intravenous Once Aneita Gwendlyn DASEN, MD       Review of Systems  Review of Systems  Constitutional: Negative.   HENT: Negative.    Respiratory:  Positive for shortness of breath. Negative for cough, chest tightness and wheezing.    Physical Exam  There were no vitals taken for this visit. Physical Exam Constitutional:      Appearance: Normal appearance. She is well-developed.  HENT:     Head: Normocephalic and atraumatic.     Mouth/Throat:     Mouth: Mucous membranes are moist.     Pharynx: Oropharynx is clear.  Eyes:     Pupils: Pupils are equal, round, and reactive to light.  Cardiovascular:     Rate and Rhythm: Normal rate and regular rhythm.     Heart sounds: Normal heart sounds. No murmur heard. Pulmonary:     Effort: Pulmonary effort is normal. No respiratory distress.     Breath sounds: Normal breath sounds. No wheezing, rhonchi or rales.  Musculoskeletal:        General: Normal range of motion.     Cervical back: Normal range of motion and neck supple.  Skin:    General: Skin is warm and dry.     Findings: No erythema or rash.  Neurological:     General: No focal deficit present.     Mental Status: She is alert and oriented to person, place, and time. Mental status is at baseline.  Psychiatric:        Mood and Affect: Mood normal.        Behavior: Behavior normal.        Thought Content: Thought content normal.        Judgment: Judgment normal.     Lab Results:  CBC    Component Value Date/Time   WBC 5.3 07/13/2024 1018   RBC 5.17 (H) 07/13/2024 1018   HGB 12.6 07/13/2024 1018   HCT 39.8 07/13/2024 1018   PLT 268.0 07/13/2024 1018   MCV 77.1 (L) 07/13/2024 1018   MCH 28.6 02/18/2017 1552   MCHC 31.6 07/13/2024 1018   RDW 17.3 (H) 07/13/2024 1018   LYMPHSABS 1.6 07/13/2024 1018   MONOABS 0.4 07/13/2024 1018   EOSABS 0.1 07/13/2024  1018   BASOSABS 0.0 07/13/2024 1018    BMET    Component Value Date/Time   NA 140 06/01/2024 0955   NA  139 03/18/2015 0000   K 4.4 06/01/2024 0955   CL 105 06/01/2024 0955   CO2 26 06/01/2024 0955   GLUCOSE 104 (H) 06/01/2024 0955   BUN 15 06/01/2024 0955   BUN 19 03/18/2015 0000   CREATININE 0.75 06/01/2024 0955   CREATININE 0.74 02/18/2017 1552   CALCIUM  9.3 06/01/2024 0955   GFRNONAA >60 01/05/2017 1323   GFRAA >60 01/05/2017 1323    BNP No results found for: BNP  ProBNP No results found for: PROBNP  Imaging: No results found.   Assessment & Plan:    1. Mild obstructive sleep apnea (Primary)  Assessment and Plan Assessment & Plan Mild chronic obstructive pulmonary disease (COPD)/emphysema Mild obstructive lung disease consistent with emphysema-type COPD, as indicated by recent pulmonary function testing. Symptoms include exertional dyspnea, particularly with activities such as climbing stairs or vacuuming. Former smoker, quit in 1976. No wheezing or chronic cough. No suspicious lung nodules on recent imaging in September.  - Initiated Spiriva Respimate 2.5mcg two puffs once daily in the morning. - Continue albuterol  as needed for acute symptoms or prior to exercise. - Evaluated for alpha-1 antitrypsin deficiency. - Will consider cardiopulmonary stress test and chest CT if symptoms do not improve with Spiriva. - Encouraged cardiovascular exercise, 20 minutes, 3-4 times a week.  Iron  deficiency anemia Low ferritin levels. Hemoglobin and hematocrit are currently normal. Continues on oral iron  supplementation. - Continue oral iron  supplementation. - Discuss with primary care provider regarding potential referral to hematology if anemia persists.  Well-controlled obstructive sleep apnea with oral appliance Obstructive sleep apnea is well-controlled with an oral appliance. Previous sleep study showed 11.8 apneic events per hour, now reduced to 3 events per hour  with the appliance. She prefers to avoid CPAP therapy. - No immediate need for repeat sleep study unless symptoms worsen.  Stable pulmonary nodules under surveillance Pulmonary nodules are stable and under surveillance. Previous imaging showed small nodules in the right middle lobe, left upper lobe, and left lower lobe, with no significant changes over the last 3 years. No pleural effusion or pneumothorax noted. - Continue annual surveillance imaging as per previous protocol.  Recording duration: 24 minutes   [name] LELON Ferrari, NP 08/02/2024

## 2024-08-02 NOTE — Patient Instructions (Signed)
  VISIT SUMMARY: During today's visit, we discussed your ongoing issues with shortness of breath during exertion, your history of iron  deficiency anemia, well-controlled sleep apnea, and stable pulmonary nodules. We reviewed your recent breathing test and made some adjustments to your treatment plan to help manage your symptoms more effectively.  YOUR PLAN: -MILD CHRONIC OBSTRUCTIVE PULMONARY DISEASE (COPD)/EMPHYSEMA: COPD is a chronic lung disease that makes it hard to breathe, especially during physical activities. We have started you on Spiriva, a long-acting bronchodilator, to be taken once daily in the morning. Continue using your albuterol  inhaler as needed for acute symptoms or before exercise. We will evaluate you for alpha-1 antitrypsin deficiency and may consider further tests if your symptoms do not improve. Additionally, I encourage you to engage in cardiovascular exercise for 20 minutes, 3-4 times a week.  -IRON  DEFICIENCY ANEMIA: Iron  deficiency anemia occurs when your body doesn't have enough iron  to produce adequate levels of hemoglobin, which is necessary for carrying oxygen in your blood. Your hemoglobin and hematocrit levels are normal, but your ferritin levels remain low. Continue taking your oral iron  supplements daily. Discuss with your primary care provider about a potential referral to a hematologist if your anemia persists.  -WELL-CONTROLLED OBSTRUCTIVE SLEEP APNEA WITH ORAL APPLIANCE: Obstructive sleep apnea is a condition where your breathing stops and starts during sleep. Your condition is well-controlled with the use of an oral appliance, reducing your apneic events from 11.8 to 3 per hour. There is no immediate need for a repeat sleep study unless your symptoms worsen.  -STABLE PULMONARY NODULES UNDER SURVEILLANCE: Pulmonary nodules are small growths in the lungs. Your nodules are stable and have not changed in size, so we will continue with annual surveillance imaging as  previously planned.  INSTRUCTIONS: Please follow up with your primary care provider to discuss the potential referral to a hematologist for your iron  deficiency anemia. Continue with your current treatments and lifestyle recommendations, and schedule your next annual imaging for your pulmonary nodules as planned.  Follow-up 4-6 week visit with Dr. Neda or Landry NP- can either be in person or virtual

## 2024-08-06 ENCOUNTER — Other Ambulatory Visit: Payer: Self-pay | Admitting: Family Medicine

## 2024-08-06 ENCOUNTER — Other Ambulatory Visit: Payer: Self-pay | Admitting: Nurse Practitioner

## 2024-08-06 DIAGNOSIS — R0609 Other forms of dyspnea: Secondary | ICD-10-CM

## 2024-08-06 NOTE — Telephone Encounter (Signed)
 Copied from CRM [id number]. Topic: Clinical - Medication Refill >> Aug 06, 2024 12:07 PM Nathanel DEL wrote: Medication: albuterol  (VENTOLIN  HFA) 108 (90 Base) MCG/ACT inhaler  Has the patient contacted their pharmacy? No (She lost hers and there are no refills This is the patient's preferred pharmacy:   [id number], [place] - [address] AT [facility] [address] [place] [address] Phone: [phone] Fax: [phone]    Is this the correct pharmacy for this prescription? Yes If no, delete pharmacy and type the correct one.   Has the prescription been filled recently? Yes  Is the patient out of the medication? Yes  Has the patient been seen for an appointment in the last year OR does the patient have an upcoming appointment? Yes  Can we respond through MyChart? No  Agent: Please be advised that Rx refills may take up to 3 business days. We ask that you follow-up with your pharmacy.

## 2024-08-07 ENCOUNTER — Telehealth: Payer: Self-pay

## 2024-08-07 NOTE — Telephone Encounter (Signed)
 Dr.Tabori has never prescribed this medication for this patient before and patient has not followed up with Dr.Tabori for this concern. Dr.Tabori is currently out of office until 08/14/2024 and will need this refill to come from Lauraine Pereyra who initially prescribed this.   This was prescribed by Lauraine Pereyra on 06/15/2024

## 2024-08-07 NOTE — Telephone Encounter (Signed)
 Patient is calling because her prescription for albuterol  (VENTOLIN  HFA) 108 (90 Base) MCG/ACT inhaler has not been sent to the pharmacy as of yet.  It should be sent to:  [facility] #[id number] - [place], [place] - [address] AT [facility] [address]  [address] [place] [address]  Phone: [phone] Fax: [phone]  Hours: Not open 24 hours  Please return her call to let her know the status of this medication at [phone].  Thanks.   Copied from CRM [id number]. Topic: Clinical - Medication Refill >> Aug 06, 2024 12:07 PM Nathanel DEL wrote: Medication: albuterol  (VENTOLIN  HFA) 108 (90 Base) MCG/ACT inhaler  Has the patient contacted their pharmacy? No (She lost hers and there are no refills This is the patient's preferred pharmacy:   [facility] #[id number] [facility], [place] - [address] AT [facility] [address] [place] [address] Phone: [phone] Fax: [phone]    Is this the correct pharmacy for this prescription? Yes If no, delete pharmacy and type the correct one.   Has the prescription been filled recently? Yes  Is the patient out of the medication? Yes  Has the patient been seen for an appointment in the last year OR does the patient have an upcoming appointment? Yes  Can we respond through MyChart? No  Agent: Please be advised that Rx refills may take up to 3 business days. We ask that you follow-up with your pharmacy. >> Aug 07, 2024 10:01 AM Essie A wrote: Patient is calling because her prescription for albuterol  (VENTOLIN  HFA) 108 (90 Base) MCG/ACT inhaler has not been sent to the pharmacy as of yet.  It should be sent to:  [facility] #[id number] - [place], [place] - [address] AT [facility] [address]  [address] [place] [address]  Phone: [phone] Fax: [phone]  Hours: Not open 24 hours  Please return her call to let her know the status of this medication at [phone].  Thanks.

## 2024-08-09 LAB — ALPHA-1 ANTITRYPSIN PHENOTYPE: A-1 Antitrypsin, Ser: 161 mg/dL (ref 83–199)

## 2024-08-13 ENCOUNTER — Ambulatory Visit: Payer: Self-pay | Admitting: Primary Care

## 2024-08-16 NOTE — Progress Notes (Signed)
 Assessment/Plan:    Mild cognitive impairment   [name] [name] is a very pleasant 73 y.o. RH female with a history of symptomatic anemia, anxiety untreated OSA, mild cognitive impairment of unclear etiology per latest neuropsych evaluation May 2025 presenting today in follow-up for evaluation of memory loss.  Memory is***, with MMSE of   /30     Recommendations:   Follow up in 1 year Neuropsych evaluation in 12 to 18 months for diagnostic clarity and disease trajectory No indication for antidementia medication at this time Recommend good control of cardiovascular risk factors Continue to control mood as per PCP, consider psychotherapy Follow-up anemia as outpatient    Subjective:   This patient is accompanied in the office by ***  who supplements the history. Previous records as well as any outside records available were reviewed prior to todays visit.   Patient was last seen on 02/14/2024 with MMSE 29/30***.    Any changes in memory since last visit? .  Reports difficulty retrieving the right word in a sentence, but denies difficulty remembering conversations and names.  Enjoys doing crossword puzzles and word finding.  LTM is good. repeats oneself?  Endorsed Disoriented when walking into a room?  Patient denies ***  Misplacing objects?  Patient denies   Wandering behavior?   Denies. Any personality changes since last visit?  More anxious and tearful than prior. Any worsening depression?: denies.   Hallucinations or paranoia?  Denies.   Seizures?   Denies.    Any sleep changes? Sleeps well***. Does not sleep very well***.   Denies vivid dreams, REM behavior or sleepwalking   Sleep apnea?  Mild, uses a mouthpiece nightly.  Follows with pulmonary. ***  Any hygiene concerns?   Denies.   Independent of bathing and dressing?  Endorsed  Does the patient needs help with medications? Patient is in charge *** Who is in charge of the finances?  Patient is in charge   *** Any  changes in appetite?  denies ***   Patient have trouble swallowing?  Denies.   Does the patient cook?  Any kitchen accidents such as leaving the stove on?   Denies.   Any headaches?    Denies.   Vision changes? Denies. Chronic pain?  Denies.   Ambulates with difficulty?    Denies. ***  Recent falls or head injuries?    Denies.      Unilateral weakness, numbness or tingling?  Denies.   Any tremors?  Minimal essential R>L tremors without change from her prior visit.  Not interfere with her ADLs, no other parkinsonian features. Any anosmia?    Denies.   Any incontinence of urine?  Denies.   Any bowel dysfunction?  She has a history of anal incontinence, tried toning exercises, she is followed by GI for possible device placement. Patient lives with her husband.*** Does the patient drive?  Yes, denies any issues***  Neuropsych evaluation 02/28/2024, Dr. Richie: Briefly, results suggested performance variability surrounding processing speed, executive functioning, and all aspects of verbal learning and memory. While she struggled with her drawing of a clock, all other visuospatial tasks were appropriate. Relative to her previous evaluation in October 2023, mild decline was exhibited across executive functioning and clock drawing. Subtle to mild decline could also be argued across processing speed and encoding aspects of a story-based memory task. Broadly speaking, other assessed cognitive domains exhibited relative stability over time. [name] pattern across testing is nonspecific and continues to not strongly align with  common neurodegenerative illnesses. Variability/weakness surrounding processing speed, executive functioning, and memory can certainly be seen in individuals with essential tremor presentations. [name] noted being recently diagnosed with iron  deficiency anemia. She also expressed concern surrounding blood pressure fluctuations and potential dysrhythmia. Her pattern of  variability/weakness across testing could also be influenced by these factors, as well as chronic cardiovascular ailments, cerebrovascular disease, and sleep disturbances such as sleep apnea. It is unclear to what extent acute and quite severe testing anxiety played in current test performances.     Past Medical History:  Diagnosis Date   Allergy 12/19/2007   Bronchitis    Cataract    Coronary artery calcification seen on CAT scan 04/09/2021   DJD (degenerative joint disease)    DOE (dyspnea on exertion) 04/09/2021   GERD (gastroesophageal reflux disease) 12/19/2007   Glaucoma    History of adenomatous colonic polyps 01/17/2017   Hypercholesterolemia    Hypothyroidism    IBS (irritable bowel syndrome)    Iron  deficiency anemia    Malignant melanoma of neck    Mild cognitive impairment 07/14/2022   Mixed hyperlipidemia 04/09/2021   OSA (obstructive sleep apnea) 12/27/2017   mouthguard; no CPAP   Osteoporosis    Plantar fasciitis    Postoperative nausea and vomiting 09/02/2015   Precordial pain 04/09/2021   Presence of shoulder implant 01/21/2021   Primary hypertension 12/19/2007   Primary localized osteoarthritis of left knee    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/21/2021   Tremor of both hands 05/21/2020   Vitamin D  deficiency 09/21/2009     Past Surgical History:  Procedure Laterality Date   ANAL RECTAL MANOMETRY N/A 11/16/2019   Procedure: ANO RECTAL MANOMETRY;  Surgeon: Shila Gustav GAILS, MD;  Location: [facility];  Service: Endoscopy;  Laterality: N/A;   BUNIONECTOMY  2004   left   CATARACT EXTRACTION W/ INTRAOCULAR LENS  IMPLANT, BILATERAL     COLONOSCOPY     EPIDURAL CATHETER INSERTION     L7   FOOT OSTEOTOMY  2003   right   HERNIA REPAIR  1957   JOINT REPLACEMENT     KNEE ARTHROSCOPY  1987&2003   left   MELANOMA EXCISION Right    shoulder   POLYPECTOMY     REVERSE SHOULDER ARTHROPLASTY Right 01/21/2021   Procedure: REVERSE SHOULDER  ARTHROPLASTY;  Surgeon: Cristy Bonner DASEN, MD;  Location: [facility];  Service: Orthopedics;  Laterality: Right;   SHOULDER ARTHROSCOPY WITH OPEN ROTATOR CUFF REPAIR  2013   right   SKIN SURGERY  01/2004   Melanoma surg left shoulder   TOENAIL EXCISION  10/04/2017   TOTAL KNEE ARTHROPLASTY Left 09/01/2015   Procedure: TOTAL KNEE ARTHROPLASTY;  Surgeon: Lamar Millman, MD;  Location: [facility];  Service: Orthopedics;  Laterality: Left;   TUBAL LIGATION  1990     PREVIOUS MEDICATIONS:   CURRENT MEDICATIONS:  Outpatient Encounter Medications as of 08/17/2024  Medication Sig   albuterol  (VENTOLIN  HFA) 108 (90 Base) MCG/ACT inhaler INHALE 2 PUFFS INTO THE LUNGS EVERY 6 HOURS AS NEEDED FOR WHEEZING OR SHORTNESS OF BREATH   amLODipine  (NORVASC ) 2.5 MG tablet Take 1 tablet (2.5 mg total) by mouth daily.   aspirin 325 MG tablet Take 325 mg by mouth daily.   atorvastatin  (LIPITOR) 10 MG tablet Take 1 tablet (10 mg total) by mouth daily.   brimonidine (ALPHAGAN) 0.2 % ophthalmic solution 1 drop 3 (three) times daily.   cetirizine (ZYRTEC) 10 MG tablet Take 10 mg  by mouth daily.   Cholecalciferol  (VITAMIN D3) 5000 UNITS CAPS Take 5,000 Units by mouth daily.    Coenzyme Q10 (COQ10) 200 MG CAPS Take 1 capsule by mouth daily.    doxycycline  (ADOXA) 50 MG tablet Take 50 mg by mouth daily. For rosacea   doxycycline  (VIBRAMYCIN ) 50 MG capsule Take 50 mg by mouth daily.   FLUoxetine  (PROZAC ) 10 MG capsule Take 1 capsule (10 mg total) by mouth daily.   iron  polysaccharides (NIFEREX) 150 MG capsule Take 1 capsule (150 mg total) by mouth daily.   losartan  (COZAAR ) 50 MG tablet TAKE 1 TABLET EVERY DAY   Magnesium  200 MG TABS Take 200 mg by mouth in the morning and at bedtime.    Multiple Vitamins-Minerals (MULTIVITAMIN PO) Take 1 tablet by mouth daily.   Niacinamide-Zn-Cu-Methfo-Se-Cr (NICOTINAMIDE PO) Take 500 mg by mouth in the morning and at bedtime.   NP THYROID  60 MG tablet TAKE 1 TABLET(60 MG)  BY MOUTH DAILY BEFORE BREAKFAST   omeprazole  (PRILOSEC) 20 MG capsule TAKE 1 CAPSULE EVERY DAY   SYSTANE BALANCE 0.6 % SOLN SMARTSIG:1 Drop(s) In Eye(s) PRN   Tiotropium Bromide (SPIRIVA RESPIMAT) 2.5 MCG/ACT AERS Inhale 2 puffs into the lungs daily.   Travoprost, BAK Free, (TRAVATAN) 0.004 % SOLN ophthalmic solution Place 1 drop into both eyes at bedtime.    tretinoin (RETIN-A) 0.05 % cream Apply topically at bedtime.   triamcinolone  cream (KENALOG) 0.1 % Apply topically as needed.   Facility-Administered Encounter Medications as of 08/17/2024  Medication   0.9 %  sodium chloride  infusion     Objective:     PHYSICAL EXAMINATION:    VITALS:  There were no vitals filed for this visit.  GEN:  The patient appears stated age and is in NAD. HEENT:  Normocephalic, atraumatic.   Neurological examination:  General: NAD, well-groomed, appears stated age. Orientation: The patient is alert. Oriented to person, place and not to date.*** Cranial nerves: There is good facial symmetry.The speech is fluent and clear. No aphasia or dysarthria. Fund of knowledge is appropriate. Recent memory impaired and remote memory is normal.  Attention and concentration are normal.  Able to name objects and repeat phrases.  Hearing is intact to conversational tone ***.   Delayed recall *** Sensation: Sensation is intact to light touch throughout Motor: Strength is at least antigravity x4. DTR's 2/4 in UE/LE       No data to display             02/14/2024    5:00 PM 08/16/2023   11:00 AM 03/01/2023    9:00 AM  MMSE - Mini Mental State Exam  Orientation to time 5 5 4   Orientation to Place 5 5 5   Registration 3 3 3   Attention/ Calculation 5 5 5   Recall 2 3 3   Language- name 2 objects 2 2 2   Language- repeat 1 1 1   Language- follow 3 step command 3 3 3   Language- read & follow direction 1 1 1   Write a sentence 1 1 1   Copy design 1 1 1   Total score 29 30 29        Movement examination: Tone:  There is normal tone in the UE/LE Abnormal movements very mild, bilateral right greater than left intention tremor, no myoclonus, no asterixis  Coordination:  There is no decremation with RAM's. Normal finger to nose  Gait and Station: The patient has no difficulty arising out of a deep-seated chair without the use of the  hands. The patient's stride length is good.  Gait is cautious and narrow.   Thank you for allowing us  the opportunity to participate in the care of this nice patient. Please do not hesitate to contact us  for any questions or concerns.   Total time spent on today's visit was *** minutes dedicated to this patient today, preparing to see patient, examining the patient, ordering tests and/or medications and counseling the patient, documenting clinical information in the EHR or other health record, independently interpreting results and communicating results to the patient/family, discussing treatment and goals, answering patient's questions and coordinating care.  Cc:  Mahlon Comer BRAVO, MD  Camie Sevin 08/16/2024 6:25 AM

## 2024-08-17 ENCOUNTER — Ambulatory Visit: Admitting: Physician Assistant

## 2024-08-17 ENCOUNTER — Encounter: Payer: Self-pay | Admitting: Physician Assistant

## 2024-08-17 VITALS — BP 137/71 | HR 92 | Resp 20 | Ht 65.0 in

## 2024-08-17 DIAGNOSIS — G3184 Mild cognitive impairment, so stated: Secondary | ICD-10-CM

## 2024-08-17 NOTE — Patient Instructions (Signed)
 Follow up in 6 months

## 2024-09-05 ENCOUNTER — Telehealth: Payer: Self-pay | Admitting: Primary Care

## 2024-09-05 ENCOUNTER — Other Ambulatory Visit: Payer: Self-pay | Admitting: *Deleted

## 2024-09-05 MED ORDER — SPIRIVA RESPIMAT 2.5 MCG/ACT IN AERS: 2.0000 | INHALATION_SPRAY | Freq: Every morning | RESPIRATORY_TRACT | 11 refills | Status: AC

## 2024-09-05 MED ORDER — SPIRIVA RESPIMAT 2.5 MCG/ACT IN AERS: 2.0000 | INHALATION_SPRAY | Freq: Every day | RESPIRATORY_TRACT | Status: AC

## 2024-09-05 MED ORDER — SPIRIVA RESPIMAT 2.5 MCG/ACT IN AERS: 2.0000 | INHALATION_SPRAY | Freq: Every day | RESPIRATORY_TRACT | 5 refills | Status: AC

## 2024-09-05 NOTE — Telephone Encounter (Signed)
 Copied from CRM #[id number]. Topic: Clinical - Medication Question >> Sep 05, 2024  8:44 AM Ismael A wrote: Reason for CRM: Tiotropium Bromide (SPIRIVA  RESPIMAT) 2.5 MCG/ACT AERS  Pt was provided 2 samples of this medication, is now out and would like to pick up a few more samples before her appt on 09/13/24

## 2024-09-05 NOTE — Telephone Encounter (Signed)
 Patient is calling again to find out if she can pick up samples of this medication: Tiotropium Bromide (SPIRIVA  RESPIMAT) 2.5 MCG/ACT AERS.  Please return her call at [phone].  Thanks.

## 2024-09-05 NOTE — Telephone Encounter (Signed)
 Sample given to patient,order sent to pharmacy as well

## 2024-09-13 ENCOUNTER — Ambulatory Visit: Admitting: Primary Care

## 2024-09-13 VITALS — BP 118/62 | HR 85 | Temp 97.6°F | Ht 65.0 in | Wt 199.4 lb

## 2024-09-13 DIAGNOSIS — R918 Other nonspecific abnormal finding of lung field: Secondary | ICD-10-CM | POA: Diagnosis not present

## 2024-09-13 DIAGNOSIS — J449 Chronic obstructive pulmonary disease, unspecified: Secondary | ICD-10-CM | POA: Diagnosis not present

## 2024-09-13 DIAGNOSIS — G4733 Obstructive sleep apnea (adult) (pediatric): Secondary | ICD-10-CM | POA: Diagnosis not present

## 2024-09-13 NOTE — Patient Instructions (Addendum)
°  VISIT SUMMARY: You came in today for a follow-up visit regarding your mild obstructive lung disease. We discussed your symptoms, including shortness of breath with exertion and the use of your rescue inhaler. We also reviewed your recent lab work and imaging results.  YOUR PLAN: -MILD CHRONIC OBSTRUCTIVE PULMONARY DISEASE: This is a lung condition often caused by smoking or exposure to irritants, leading to breathing difficulties. You should continue using Spiriva , adjusting to one puff in the morning and one in the evening to reduce coughing. Monitor for signs of infection and contact us  if you experience increased breathing difficulties or if your rescue inhaler is not effective. Refills for Spiriva  have been provided.  -PULMONARY NODULES, UNDER SURVEILLANCE: These are small growths in the lungs that need regular monitoring. Your recent scan showed no suspicious changes.   -MILD OBSTRUCTIVE SLEEP APNEA: This is a condition where your breathing stops and starts during sleep. It is well-controlled with your oral appliance, so you should continue using it as you have been.  INSTRUCTIONS: Please continue with the adjusted dosage of Spiriva  and monitor for any signs of infection or increased breathing difficulties. Contact us  if you notice any worsening symptoms. Continue with your annual imaging for pulmonary nodules and maintain the use of your oral appliance for sleep apnea.  Follow-up 6 months with Landry NP

## 2024-09-13 NOTE — Progress Notes (Signed)
 @Patient  ID: [name] [name], female    DOB: Aug 02, 1951, 73 y.o.   MRN: [id number]  Chief Complaint  Patient presents with   Obstructive Sleep Apnea    Still wearing oral appliance, Spiriva  is helping has phlegm in am from inhaler    Referring provider: Mahlon Comer BRAVO, MD  HPI: 73 year old female, former smoker quit in 1970s (significant second hand exposure). PMH significant for mild OSA, iron  deficiency anemia    Previous [facility] pulmoanry encounter 06/11/24- Dr. Neda  Patient with mild obstructive sleep apnea recently started using an oral device Has continued using an oral device Feels her sleep quality is better Less snoring Wakes up feeling rested  Has had worsening shortness of breath Was recently seen and had a follow-up with cardiology - Echocardiogram was within normal limits - I did review previous pulmonary function test with her as well performed in November 2023 showing normal PFT  She had a PET scan done in May 2025 that did not reveal concerning findings She has a follow-up CT scan scheduled for about a week from now to follow-up on the lung nodules  She is short of breath with walking up steps, about 300 yards to make her short of breath  Denies any chest pains or chest discomfort No ongoing cough  No history of asthma She does not have any wheezing, no chronic cough  Smoked in the past but quit in 1976 only smokes about 6 years  Recently been treated for iron  deficiency, hematocrit is within normal limits currently  Prior to being evaluated by cardiology, did have some chest discomfort which has not been present over the last couple of months.  Continues to have significant shortness of breath  08/02/2024 Discussed the use of AI scribe software for clinical note transcription with the patient, who gave verbal consent to proceed.  History of Present Illness [name] [name] is a 73 year old female with COPD who presents with shortness of breath on  exertion.  She experiences shortness of breath primarily during exertion, such as climbing stairs, lifting, or vacuuming, and describes feeling 'wiped out' during these activities. She uses a rescue inhaler, albuterol , three times when gasping for breath, which provides relief. No wheezing, chest tightness, or chronic cough. A recent breathing test was performed.  She has a history of smoking, having quit in 1976, and was exposed to secondhand smoke from both parents who smoked heavily. Her mother died of COPD.  She has a history of iron  deficiency anemia. She is currently taking daily oral iron  supplements. Her ferritin levels remain low despite treatment, although her hemoglobin and hematocrit levels are normal.  She has a history of sleep apnea, diagnosed in November 2023, with 11.8 apneic events per hour. She has been using an oral appliance for a year, which has improved her sleep quality and reduced snoring. A recent study indicated her apnea score is down to three events per hour, indicating well-controlled sleep apnea.  She has a history of pulmonary nodules discovered during a PET CT scan in May 2025, with follow-up CT scan in September 2025 showing no change in size of pulmonary nodules. The nodules are small, measuring between four to five millimeters, and are not considered suspicious.   PFTs showed minimal obstructive lung disease consistent with emphysema.    1. Mild obstructive sleep apnea (Primary)   Assessment and Plan Assessment & Plan Mild chronic obstructive pulmonary disease (COPD)/emphysema Mild obstructive lung disease consistent with emphysema-type COPD,  as indicated by recent pulmonary function testing. Symptoms include exertional dyspnea, particularly with activities such as climbing stairs or vacuuming. Former smoker, quit in 1976. No wheezing or chronic cough. No suspicious lung nodules on recent imaging in September.  - Initiated Spiriva  Respimate 2.5mcg two puffs  once daily in the morning. - Continue albuterol  as needed for acute symptoms or prior to exercise. - Evaluated for alpha-1 antitrypsin deficiency. - Will consider cardiopulmonary stress test and chest CT if symptoms do not improve with Spiriva . - Encouraged cardiovascular exercise, 20 minutes, 3-4 times a week.   Iron  deficiency anemia Low ferritin levels. Hemoglobin and hematocrit are currently normal. Continues on oral iron  supplementation. - Continue oral iron  supplementation. - Discuss with primary care provider regarding potential referral to hematology if anemia persists.   Well-controlled obstructive sleep apnea with oral appliance Obstructive sleep apnea is well-controlled with an oral appliance. Previous sleep study showed 11.8 apneic events per hour, now reduced to 3 events per hour with the appliance. She prefers to avoid CPAP therapy. - No immediate need for repeat sleep study unless symptoms worsen.   Stable pulmonary nodules under surveillance Pulmonary nodules are stable and under surveillance. Previous imaging showed small nodules in the right middle lobe, left upper lobe, and left lower lobe, with no significant changes over the last 3 years. No pleural effusion or pneumothorax noted. - Continue annual surveillance imaging as per previous protocol.  09/13/2024- Interim hx  Discussed the use of AI scribe software for clinical note transcription with the patient, who gave verbal consent to proceed.  History of Present Illness [name] [name] is a 73 year old female with mild obstructive lung disease who presents for a follow-up visit.  Hx Mild COPD/emphysema and OSA. Former smoker quit in 1976. Alpha 1 phenotype normal. She experiences shortness of breath with exertion, such as climbing stairs and vacuuming, and feels 'wiped out' during activities. She uses her rescue inhaler three times a day, which provides some relief. She was given a sample of Spiriva , a long-acting  bronchodilator, and reports that she has noticed improvement. However, she experiences coughing, particularly in the morning and continuing past lunch, after using the bronchodilator.  Her past medical history includes iron  deficiency anemia, for which she is on iron  supplements. Recent lab work showed improvement in her ferritin levels, which increased from 7.8 to 10.  She has a history of sleep apnea, which is well controlled with an oral appliance. No waking up gasping or choking and only a little bit of snoring.  She has pulmonary nodules that are under annual surveillance. A recent scan showed no suspicious nodules, masses, or infiltrates, and no significant change over the past three years.   Allergies[1]  Immunization History  Administered Date(s) Administered   Fluad Quad(high Dose 65+) 06/04/2019, 07/02/2021, 07/16/2021, 06/17/2022   Fluad Trivalent(High Dose 65+) 06/01/2023   INFLUENZA, HIGH DOSE SEASONAL PF 06/12/2018, 06/03/2020, 07/13/2024   Influenza Split 08/04/2012   Influenza, Seasonal, Injecte, Preservative Fre 06/04/2019   Influenza,inj,Quad PF,6+ Mos 11/22/2014, 07/01/2015, 06/04/2016, 07/25/2017   Influenza-Unspecified 07/03/2021   PFIZER(Purple Top)SARS-COV-2 Vaccination 10/26/2019, 11/13/2019, 06/19/2020, 02/11/2021   PNEUMOCOCCAL CONJUGATE-20 05/20/2021   Pfizer Covid-19 Vaccine Bivalent Booster 9yrs & up 07/16/2021   Pfizer(Comirnaty)Fall Seasonal Vaccine 12 years and older 08/01/2024   Pneumococcal Conjugate-13 05/17/2018   Tdap 10/01/2011, 05/20/2021   Zoster Recombinant(Shingrix) 07/25/2017, 12/22/2017   Zoster, Live 12/03/2015    Past Medical History:  Diagnosis Date   Allergy 12/19/2007   Bronchitis  Cataract    Coronary artery calcification seen on CAT scan 04/09/2021   DJD (degenerative joint disease)    DOE (dyspnea on exertion) 04/09/2021   GERD (gastroesophageal reflux disease) 12/19/2007   Glaucoma    History of adenomatous colonic polyps  01/17/2017   Hypercholesterolemia    Hypothyroidism    IBS (irritable bowel syndrome)    Iron  deficiency anemia    Malignant melanoma of neck    Mild cognitive impairment 07/14/2022   Mixed hyperlipidemia 04/09/2021   OSA (obstructive sleep apnea) 12/27/2017   mouthguard; no CPAP   Osteoporosis    Plantar fasciitis    Postoperative nausea and vomiting 09/02/2015   Precordial pain 04/09/2021   Presence of shoulder implant 01/21/2021   Primary hypertension 12/19/2007   Primary localized osteoarthritis of left knee    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/21/2021   Tremor of both hands 05/21/2020   Vitamin D  deficiency 09/21/2009    Tobacco History: Tobacco Use History[2] Counseling given: Not Answered   Outpatient Medications Prior to Visit  Medication Sig Dispense Refill   albuterol  (VENTOLIN  HFA) 108 (90 Base) MCG/ACT inhaler INHALE 2 PUFFS INTO THE LUNGS EVERY 6 HOURS AS NEEDED FOR WHEEZING OR SHORTNESS OF BREATH 8.5 g 1   amLODipine  (NORVASC ) 2.5 MG tablet Take 1 tablet (2.5 mg total) by mouth daily. 90 tablet 3   aspirin 325 MG tablet Take 325 mg by mouth daily.     atorvastatin  (LIPITOR) 10 MG tablet Take 1 tablet (10 mg total) by mouth daily. 90 tablet 1   brimonidine (ALPHAGAN) 0.2 % ophthalmic solution 1 drop 3 (three) times daily.     cetirizine (ZYRTEC) 10 MG tablet Take 10 mg by mouth daily.     Cholecalciferol  (VITAMIN D3) 5000 UNITS CAPS Take 5,000 Units by mouth daily.      Coenzyme Q10 (COQ10) 200 MG CAPS Take 1 capsule by mouth daily.      doxycycline  (ADOXA) 50 MG tablet Take 50 mg by mouth daily. For rosacea     doxycycline  (VIBRAMYCIN ) 50 MG capsule Take 50 mg by mouth daily.     FLUoxetine  (PROZAC ) 10 MG capsule Take 1 capsule (10 mg total) by mouth daily. 30 capsule 3   iron  polysaccharides (NIFEREX) 150 MG capsule Take 1 capsule (150 mg total) by mouth daily. 90 capsule 1   losartan  (COZAAR ) 50 MG tablet TAKE 1 TABLET EVERY DAY 90 tablet 3    Magnesium  200 MG TABS Take 200 mg by mouth in the morning and at bedtime.      Multiple Vitamins-Minerals (MULTIVITAMIN PO) Take 1 tablet by mouth daily.     Niacinamide-Zn-Cu-Methfo-Se-Cr (NICOTINAMIDE PO) Take 500 mg by mouth in the morning and at bedtime.     NP THYROID  60 MG tablet TAKE 1 TABLET(60 MG) BY MOUTH DAILY BEFORE BREAKFAST 90 tablet 1   omeprazole  (PRILOSEC) 20 MG capsule TAKE 1 CAPSULE EVERY DAY 90 capsule 3   SYSTANE BALANCE 0.6 % SOLN SMARTSIG:1 Drop(s) In Eye(s) PRN     Tiotropium Bromide (SPIRIVA  RESPIMAT) 2.5 MCG/ACT AERS Inhale 2 puffs into the lungs daily.     Tiotropium Bromide (SPIRIVA  RESPIMAT) 2.5 MCG/ACT AERS Inhale 2 puffs into the lungs daily. 4 g 5   Tiotropium Bromide (SPIRIVA  RESPIMAT) 2.5 MCG/ACT AERS Inhale 2 puffs into the lungs daily.     Tiotropium Bromide (SPIRIVA  RESPIMAT) 2.5 MCG/ACT AERS Inhale 2 puffs into the lungs every morning. 4 g 11   Travoprost, BAK Free, (TRAVATAN)  0.004 % SOLN ophthalmic solution Place 1 drop into both eyes at bedtime.      tretinoin (RETIN-A) 0.05 % cream Apply topically at bedtime.     triamcinolone  cream (KENALOG) 0.1 % Apply topically as needed.     Facility-Administered Medications Prior to Visit  Medication Dose Route Frequency Provider Last Rate Last Admin   0.9 %  sodium chloride  infusion  500 mL Intravenous Once Aneita Gwendlyn DASEN, MD       Review of Systems  Review of Systems  Constitutional: Negative.   Respiratory:  Positive for cough.   Psychiatric/Behavioral:  Negative for sleep disturbance.    Physical Exam  There were no vitals taken for this visit. Physical Exam Constitutional:      Appearance: Normal appearance. She is well-developed. She is not ill-appearing.  HENT:     Head: Normocephalic and atraumatic.     Mouth/Throat:     Mouth: Mucous membranes are moist.     Pharynx: Oropharynx is clear.  Eyes:     Pupils: Pupils are equal, round, and reactive to light.  Cardiovascular:     Rate and  Rhythm: Normal rate and regular rhythm.     Heart sounds: Normal heart sounds. No murmur heard. Pulmonary:     Effort: Pulmonary effort is normal. No respiratory distress.     Breath sounds: Normal breath sounds. No wheezing or rhonchi.  Musculoskeletal:        General: Normal range of motion.     Cervical back: Normal range of motion and neck supple.  Skin:    General: Skin is warm and dry.     Findings: No erythema or rash.  Neurological:     General: No focal deficit present.     Mental Status: She is alert and oriented to person, place, and time. Mental status is at baseline.  Psychiatric:        Mood and Affect: Mood normal.        Behavior: Behavior normal.        Thought Content: Thought content normal.        Judgment: Judgment normal.      Lab Results:  CBC    Component Value Date/Time   WBC 5.3 07/13/2024 1018   RBC 5.17 (H) 07/13/2024 1018   HGB 12.6 07/13/2024 1018   HCT 39.8 07/13/2024 1018   PLT 268.0 07/13/2024 1018   MCV 77.1 (L) 07/13/2024 1018   MCH 28.6 02/18/2017 1552   MCHC 31.6 07/13/2024 1018   RDW 17.3 (H) 07/13/2024 1018   LYMPHSABS 1.6 07/13/2024 1018   MONOABS 0.4 07/13/2024 1018   EOSABS 0.1 07/13/2024 1018   BASOSABS 0.0 07/13/2024 1018    BMET    Component Value Date/Time   NA 140 06/01/2024 0955   NA 139 03/18/2015 0000   K 4.4 06/01/2024 0955   CL 105 06/01/2024 0955   CO2 26 06/01/2024 0955   GLUCOSE 104 (H) 06/01/2024 0955   BUN 15 06/01/2024 0955   BUN 19 03/18/2015 0000   CREATININE 0.75 06/01/2024 0955   CREATININE 0.74 02/18/2017 1552   CALCIUM  9.3 06/01/2024 0955   GFRNONAA >60 01/05/2017 1323   GFRAA >60 01/05/2017 1323    BNP No results found for: BNP  ProBNP No results found for: PROBNP  Imaging: No results found.   Assessment & Plan:   1. Mild obstructive sleep apnea (Primary)  Assessment and Plan Assessment & Plan Mild chronic obstructive pulmonary disease Mild obstructive lung disease  secondary to past smoking and secondhand exposure. Normal alpha-1 antitrypsin levels and MM phenotype, indicating no genetic deficiency. Experiences exertional dyspnea and uses rescue inhaler three times daily. Spiriva  has been beneficial but causes coughing, especially in the morning. No signs of infection such as colored mucus, bad taste, or fever. No indication for repeat lung function tests unless symptoms decline. - Continue Spiriva  with adjustment to one puff in the morning and one puff in the evening to reduce cough. - Monitor for signs of infection such as colored mucus, bad taste, or fever. - Contact provider if experiencing increased dyspnea, chest tightness, wheezing, or if albuterol  is ineffective. - Provided refills for Spiriva .  Pulmonary nodules, under surveillance Pulmonary nodules under annual surveillance. Recent scan showed no suspicious nodules, masses, or infiltrates, with no significant change over the past three years.  Mild obstructive sleep apnea Well-controlled with an oral appliance. No issues with waking up gasping, choking, or significant snoring. - Continue current management with oral appliance.   [name] LELON Ferrari, NP 09/13/2024     [1]  Allergies Allergen Reactions   Clindamycin/Lincomycin Diarrhea   Flagyl [Metronidazole Hcl] Diarrhea    Stomach upset. Pt states she CANNOT TAKE oral, has no problem with topical.    Augmentin  [Amoxicillin -Pot Clavulanate] Diarrhea   Telithromycin Diarrhea  [2]  Social History Tobacco Use  Smoking Status Former   Current packs/day: 0.00   Types: Cigarettes   Start date: 10/04/1969   Quit date: 10/05/1975   Years since quitting: 48.9   Passive exposure: Past  Smokeless Tobacco Never

## 2024-09-16 ENCOUNTER — Other Ambulatory Visit: Payer: Self-pay | Admitting: Family

## 2024-09-16 DIAGNOSIS — R0609 Other forms of dyspnea: Secondary | ICD-10-CM

## 2024-10-11 ENCOUNTER — Encounter: Payer: Self-pay | Admitting: Cardiology

## 2024-10-19 ENCOUNTER — Other Ambulatory Visit

## 2024-12-12 ENCOUNTER — Encounter: Admitting: Family Medicine

## 2025-01-15 ENCOUNTER — Encounter

## 2025-02-14 ENCOUNTER — Ambulatory Visit: Admitting: Physician Assistant

## 2025-03-14 ENCOUNTER — Ambulatory Visit: Admitting: Primary Care
